# Patient Record
Sex: Male | Born: 1935 | Race: White | Hispanic: No | Marital: Married | Smoking: Former smoker
Health system: Southern US, Community
[De-identification: ages and names within clinical notes are randomized; demographics above are authoritative.]

## PROBLEM LIST (undated history)

## (undated) DIAGNOSIS — F32A Depression, unspecified: Secondary | ICD-10-CM

## (undated) DIAGNOSIS — I739 Peripheral vascular disease, unspecified: Secondary | ICD-10-CM

## (undated) DIAGNOSIS — I255 Ischemic cardiomyopathy: Secondary | ICD-10-CM

## (undated) DIAGNOSIS — I2510 Atherosclerotic heart disease of native coronary artery without angina pectoris: Secondary | ICD-10-CM

## (undated) DIAGNOSIS — F329 Major depressive disorder, single episode, unspecified: Secondary | ICD-10-CM

## (undated) DIAGNOSIS — I10 Essential (primary) hypertension: Secondary | ICD-10-CM

## (undated) DIAGNOSIS — M1990 Unspecified osteoarthritis, unspecified site: Secondary | ICD-10-CM

## (undated) DIAGNOSIS — K851 Biliary acute pancreatitis without necrosis or infection: Secondary | ICD-10-CM

## (undated) DIAGNOSIS — I4510 Unspecified right bundle-branch block: Secondary | ICD-10-CM

## (undated) DIAGNOSIS — J449 Chronic obstructive pulmonary disease, unspecified: Secondary | ICD-10-CM

## (undated) DIAGNOSIS — E785 Hyperlipidemia, unspecified: Secondary | ICD-10-CM

## (undated) DIAGNOSIS — I213 ST elevation (STEMI) myocardial infarction of unspecified site: Secondary | ICD-10-CM

## (undated) DIAGNOSIS — I350 Nonrheumatic aortic (valve) stenosis: Secondary | ICD-10-CM

## (undated) HISTORY — DX: Nonrheumatic aortic (valve) stenosis: I35.0

## (undated) HISTORY — DX: Peripheral vascular disease, unspecified: I73.9

## (undated) HISTORY — DX: Hyperlipidemia, unspecified: E78.5

## (undated) HISTORY — DX: Essential (primary) hypertension: I10

## (undated) HISTORY — DX: Major depressive disorder, single episode, unspecified: F32.9

## (undated) HISTORY — DX: Unspecified right bundle-branch block: I45.10

## (undated) HISTORY — DX: Depression, unspecified: F32.A

## (undated) HISTORY — DX: Biliary acute pancreatitis without necrosis or infection: K85.10

## (undated) HISTORY — DX: Atherosclerotic heart disease of native coronary artery without angina pectoris: I25.10

## (undated) HISTORY — DX: Unspecified osteoarthritis, unspecified site: M19.90

---

## 1969-08-09 HISTORY — PX: OTHER SURGICAL HISTORY: SHX169

## 1977-12-09 HISTORY — PX: OTHER SURGICAL HISTORY: SHX169

## 2005-10-02 ENCOUNTER — Ambulatory Visit: Payer: Self-pay | Admitting: Internal Medicine

## 2008-01-04 ENCOUNTER — Ambulatory Visit: Payer: Self-pay | Admitting: Internal Medicine

## 2008-01-04 ENCOUNTER — Telehealth (INDEPENDENT_AMBULATORY_CARE_PROVIDER_SITE_OTHER): Payer: Self-pay | Admitting: *Deleted

## 2008-01-07 ENCOUNTER — Ambulatory Visit: Payer: Self-pay | Admitting: Internal Medicine

## 2008-01-07 DIAGNOSIS — I10 Essential (primary) hypertension: Secondary | ICD-10-CM | POA: Insufficient documentation

## 2008-01-07 DIAGNOSIS — N138 Other obstructive and reflux uropathy: Secondary | ICD-10-CM

## 2008-01-07 DIAGNOSIS — N401 Enlarged prostate with lower urinary tract symptoms: Secondary | ICD-10-CM | POA: Insufficient documentation

## 2008-01-07 DIAGNOSIS — E782 Mixed hyperlipidemia: Secondary | ICD-10-CM | POA: Insufficient documentation

## 2008-01-11 ENCOUNTER — Encounter (INDEPENDENT_AMBULATORY_CARE_PROVIDER_SITE_OTHER): Payer: Self-pay | Admitting: *Deleted

## 2008-01-14 ENCOUNTER — Ambulatory Visit: Payer: Self-pay | Admitting: Cardiology

## 2008-02-29 ENCOUNTER — Ambulatory Visit: Payer: Self-pay | Admitting: Cardiology

## 2009-02-24 ENCOUNTER — Encounter: Payer: Self-pay | Admitting: Internal Medicine

## 2010-03-01 ENCOUNTER — Encounter: Admission: RE | Admit: 2010-03-01 | Discharge: 2010-03-01 | Payer: Self-pay | Admitting: Orthopedic Surgery

## 2010-03-12 ENCOUNTER — Encounter: Admission: RE | Admit: 2010-03-12 | Discharge: 2010-03-12 | Payer: Self-pay | Admitting: Orthopedic Surgery

## 2010-03-22 ENCOUNTER — Encounter: Payer: Self-pay | Admitting: Internal Medicine

## 2010-04-06 ENCOUNTER — Telehealth: Payer: Self-pay | Admitting: Cardiology

## 2010-07-30 ENCOUNTER — Encounter: Payer: Self-pay | Admitting: Internal Medicine

## 2010-07-30 ENCOUNTER — Ambulatory Visit: Payer: Self-pay | Admitting: Cardiology

## 2010-07-30 ENCOUNTER — Ambulatory Visit: Payer: Self-pay | Admitting: Internal Medicine

## 2010-07-30 DIAGNOSIS — R42 Dizziness and giddiness: Secondary | ICD-10-CM

## 2010-07-31 ENCOUNTER — Encounter: Payer: Self-pay | Admitting: Internal Medicine

## 2010-07-31 LAB — CONVERTED CEMR LAB
HCT: 46.3 % (ref 39.0–52.0)
Hemoglobin: 16.3 g/dL (ref 13.0–17.0)
RDW: 12.9 % (ref 11.5–15.5)

## 2010-08-03 ENCOUNTER — Telehealth: Payer: Self-pay | Admitting: Internal Medicine

## 2010-08-03 LAB — CONVERTED CEMR LAB
BUN: 20 mg/dL (ref 6–23)
CO2: 28 meq/L (ref 19–32)
Calcium: 9.2 mg/dL (ref 8.4–10.5)
Chloride: 103 meq/L (ref 96–112)
Creatinine, Ser: 1 mg/dL (ref 0.4–1.5)
GFR calc non Af Amer: 79.92 mL/min (ref >60)
Glucose, Bld: 76 mg/dL (ref 70–99)
Potassium: 4.1 meq/L (ref 3.5–5.1)
Sodium: 142 meq/L (ref 135–145)

## 2010-08-29 ENCOUNTER — Ambulatory Visit: Payer: Self-pay | Admitting: Internal Medicine

## 2010-08-30 ENCOUNTER — Encounter: Payer: Self-pay | Admitting: Internal Medicine

## 2010-12-24 ENCOUNTER — Other Ambulatory Visit: Payer: Self-pay | Admitting: Internal Medicine

## 2010-12-24 ENCOUNTER — Ambulatory Visit
Admission: RE | Admit: 2010-12-24 | Discharge: 2010-12-24 | Payer: Self-pay | Source: Home / Self Care | Attending: Internal Medicine | Admitting: Internal Medicine

## 2010-12-24 ENCOUNTER — Encounter: Payer: Self-pay | Admitting: Internal Medicine

## 2010-12-24 DIAGNOSIS — M1990 Unspecified osteoarthritis, unspecified site: Secondary | ICD-10-CM | POA: Insufficient documentation

## 2010-12-25 LAB — HEPATIC FUNCTION PANEL
ALT: 29 U/L (ref 0–53)
AST: 28 U/L (ref 0–37)
Albumin: 4.5 g/dL (ref 3.5–5.2)
Alkaline Phosphatase: 74 U/L (ref 39–117)
Bilirubin, Direct: 0.2 mg/dL (ref 0.0–0.3)
Total Bilirubin: 0.8 mg/dL (ref 0.3–1.2)
Total Protein: 7.2 g/dL (ref 6.0–8.3)

## 2010-12-25 LAB — CBC WITH DIFFERENTIAL/PLATELET
HCT: 47.6 % (ref 39.0–52.0)
Hemoglobin: 16.1 g/dL (ref 13.0–17.0)
MCHC: 33.9 g/dL (ref 30.0–36.0)
MCV: 96.7 fl (ref 78.0–100.0)
Platelets: 290 10*3/uL (ref 150.0–400.0)
RBC: 4.93 Mil/uL (ref 4.22–5.81)
RDW: 12.4 % (ref 11.5–14.6)
WBC: 6.3 10*3/uL (ref 4.5–10.5)

## 2010-12-25 LAB — BASIC METABOLIC PANEL
BUN: 25 mg/dL — ABNORMAL HIGH (ref 6–23)
CO2: 27 mEq/L (ref 19–32)
Calcium: 9.4 mg/dL (ref 8.4–10.5)
Chloride: 102 mEq/L (ref 96–112)
Creatinine, Ser: 1.2 mg/dL (ref 0.4–1.5)
GFR: 61.27 mL/min (ref >60.00)
Glucose, Bld: 89 mg/dL (ref 70–99)
Potassium: 4.5 mEq/L (ref 3.5–5.1)
Sodium: 138 mEq/L (ref 135–145)

## 2010-12-25 LAB — TSH: TSH: 3.36 u[IU]/mL (ref 0.35–5.50)

## 2010-12-31 ENCOUNTER — Telehealth (INDEPENDENT_AMBULATORY_CARE_PROVIDER_SITE_OTHER): Payer: Self-pay | Admitting: *Deleted

## 2011-01-01 ENCOUNTER — Telehealth (INDEPENDENT_AMBULATORY_CARE_PROVIDER_SITE_OTHER): Payer: Self-pay | Admitting: *Deleted

## 2011-01-02 ENCOUNTER — Encounter: Payer: Self-pay | Admitting: Cardiology

## 2011-01-02 ENCOUNTER — Encounter (HOSPITAL_COMMUNITY)
Admission: RE | Admit: 2011-01-02 | Discharge: 2011-01-08 | Payer: Self-pay | Source: Home / Self Care | Attending: Internal Medicine | Admitting: Internal Medicine

## 2011-01-02 ENCOUNTER — Encounter: Payer: Self-pay | Admitting: *Deleted

## 2011-01-02 ENCOUNTER — Ambulatory Visit: Admission: RE | Admit: 2011-01-02 | Discharge: 2011-01-02 | Payer: Self-pay | Source: Home / Self Care

## 2011-01-06 LAB — CONVERTED CEMR LAB
BUN: 15 mg/dL (ref 6–23)
Creatinine, Ser: 1.1 mg/dL (ref 0.4–1.5)
PSA: 2.81 ng/mL (ref 0.10–4.00)
Potassium: 4.5 meq/L (ref 3.5–5.1)

## 2011-01-09 NOTE — Assessment & Plan Note (Signed)
Summary: 1 MONTH FOLLOWUP/KN   Vital Signs:  Patient profile:   77 year old male Weight:      217.13 pounds Pulse rate:   71 / minute Pulse rhythm:   regular BP sitting:   132 / 80  (left arm) Cuff size:   large  Vitals Entered By: Army Fossa CMA (August 29, 2010 10:46 AM) CC: follow up visit, not fasting, no concerns Comments declines flu shot pharm- [facility] [address]    History of Present Illness: followup from the last office visit 2 days after the last visit, he had another episode of dizziness, he self discontinued amlodipine and no further episodes since. He restarted about within 4 days ago because he noticed his SBP was 175  ROS No headaches No chest pain or palpitations No lower extremity edema  Current Medications (verified): 1)  None 2)  Norvasc 10 Mg Tabs (Amlodipine Besylate) .[name].. 1 Tab Po Once Daily 3)  Motrin Ib 200 Mg Tabs (Ibuprofen)  Allergies (verified): No Known Drug Allergies  Past History:  Past Medical History: Reviewed history from 07/30/2010 and no changes required. Hypertension DJD, has right  knee pain and back pain PNA 2006 chest pain, 2009, saw  cardiology , decline a  stress test claudication?, 2009, saw  cardiology, declined ABIs  Past Surgical History: Reviewed history from 01/07/2008 and no changes required. 1979 myleogram   Social History: Reviewed history from 07/30/2010 and no changes required. married 2 children tobacco-- quit after 50 years aprox 1990 ETOH-- no  Physical Exam  General:  alert and well-developed.   Lungs:  normal respiratory effort, no intercostal retractions, no accessory muscle use, and normal breath sounds.   Heart:  normal rate, regular rhythm, and no murmur.   Neurologic:  alert & oriented X3, speech clear, gait normal, motor seems intact Psych:  not anxious appearing and not depressed appearing.     Impression & Recommendations:  Problem # 1:  DIZZINESS (ICD-780.4) see history of  present illness, symptoms decreased after he discontinued amlodipine (he also started to take an aspirin) Patient wonders if sx related to amlodipine  ----> while I am not sure that is the case, we can switch him from amlodipine to something else and observe. also recommend the patient to stay in aspirin if symptoms resurface, consider ENT versus neurology referral  Problem # 2:  HYPERTENSION, ESSENTIAL NOS (ICD-401.9)  see #1 Switch from amlodipine to losartan 50 See instructions EKG per wife request : sinus brady, no change from previous  His updated medication list for this problem includes:    Losartan Potassium 50 Mg Tabs (Losartan potassium) ..... One my mouth daily  Orders: EKG w/ Interpretation (93000)  Complete Medication List: 1)  Losartan Potassium 50 Mg Tabs (Losartan potassium) .... One my mouth daily 2)  Motrin Ib 200 Mg Tabs (Ibuprofen) 3)  Aspirin 81 Mg Tbec (Aspirin) .... One by mouth daily  Patient Instructions: 1)  switch to losartan one tablet daily 2)  Come back in 3 weeks for a nurse visit: 3)  BP check 4)  BMP, dx hypertension 5)  See your primary doctor in 3 months 6)  Call if the dizziness resurface Prescriptions: LOSARTAN POTASSIUM 50 MG TABS (LOSARTAN POTASSIUM) one my mouth daily  #30 x 3   Entered and Authorized by:   Elita Quick E. Paz MD   Signed by:   Nolon Rod. Paz MD on 08/31/2010   Method used:   Electronically to  [facility]       [address]       [place], [place]  [address]       Ph: [phone]       Fax: [phone]   RxID:   [id number]

## 2011-01-09 NOTE — Progress Notes (Signed)
Summary: pt needs refill  Medications Added NORVASC 10 MG TABS (AMLODIPINE BESYLATE) 1 tab po once daily       Phone Note Refill Request Message from:  Patient on [facility] on [address]  Refills Requested: Medication #1:  norvasc 5mg  qd Initial call taken by: Omer Jack,  April 06, 2010 10:28 AM    New/Updated Medications: NORVASC 10 MG TABS (AMLODIPINE BESYLATE) 1 tab po once daily Prescriptions: NORVASC 10 MG TABS (AMLODIPINE BESYLATE) 1 tab po once daily  #30 x 4   Entered by:   Kem Parkinson   Authorized by:   Ferman Hamming, MD, [facility]   Signed by:   Kem Parkinson on 04/06/2010   Method used:   Electronically to        [facility] (ret[facility])       [address]       [place], [place]  [address]       Ph: [phone]       Fax: [phone]   RxID:   [id number]

## 2011-01-09 NOTE — Assessment & Plan Note (Signed)
Summary: NAUSEA - DIZZY/CBS   Vital Signs:  Patient profile:   77 year old male Weight:      217 pounds Temp:     97.3 degrees F oral Pulse rate:   61 / minute Pulse rhythm:   regular BP sitting:   126 / 82  (left arm) Cuff size:   large  Vitals Entered By: Army Fossa CMA (July 30, 2010 1:28 PM) CC: Dizzy spells. Comments "off and on for a long time"  Nausated yesterday Unable to tell if he feels better after eating when he has a dizzy spell.    History of Present Illness: here with his wife dizzines on off x a while, yesterday he woke up very dizzy, sx worse than usual , symptoms described as his head is spinning, dizziness increased by moving his head or bending his torso. Symptoms decreased if he holds steady and close his eyes Dizziness was associated with nausea, no vomiting. He has hypertension, good medication compliance, ambulatory BPs in the 120/80 range at this point his symptoms are essentially resolved  ROS Denies chest pain, has some shortness of breath which is at baseline. No palpitations No vomiting or diarrhea No headaches, slurred speech, facial or motor deficits  Current Medications (verified): 1)  None 2)  Norvasc 10 Mg Tabs (Amlodipine Besylate) .[name].. 1 Tab Po Once Daily 3)  Motrin Ib 200 Mg Tabs (Ibuprofen)  Allergies (verified): No Known Drug Allergies  Past History:  Past Medical History: Hypertension DJD, has right  knee pain and back pain PNA 2006 chest pain, 2009, saw  cardiology , decline a  stress test claudication?, 2009, saw  cardiology, declined ABIs  Past Surgical History: Reviewed history from 01/07/2008 and no changes required. 1979 myleogram   Social History: married 2 children tobacco-- quit after 50 years aprox 1990 ETOH-- no  Physical Exam  General:  alert, well-developed, and well-nourished.   Eyes:  not pale or icteric Neck:  no masses and normal carotid upstroke.   Lungs:  normal respiratory effort, no  intercostal retractions, no accessory muscle use, and normal breath sounds.   Heart:  normal rate, regular rhythm, and no murmur.   Extremities:  no edema Neurologic:  alert & oriented X3, cranial nerves II-XII intact, strength normal in all extremities, and gait normal.   Psych:  Oriented X3, memory intact for recent and remote, normally interactive, good eye contact, not anxious appearing, and not depressed appearing.     Impression & Recommendations:  Problem # 1:  DIZZINESS (ICD-780.4) Assessment Deteriorated dizziness with peripheral  features Doubt related to a stroke or a vascular issue.  Will get a CT of the head   Orders: Radiology Referral (Radiology) Specimen Handling (32[address])  Problem # 2:  HYPERTENSION, ESSENTIAL NOS (ICD-401.9) reports good compliance with Norvasc, good ambulatory BPs. No edema. No change His updated medication list for this problem includes:    Norvasc 10 Mg Tabs (Amlodipine besylate) .[name]... 1 tab po once daily  Orders: Venipuncture (88416) TLB-BMP (Basic Metabolic Panel-BMET) (80048-METABOL) TLB-CBC Platelet - w/Differential (85025-CBCD) Specimen Handling (60630)  Complete Medication List: 1)  None  2)  Norvasc 10 Mg Tabs (Amlodipine besylate) .[name].. 1 tab po once daily 3)  Motrin Ib 200 Mg Tabs (Ibuprofen)  Patient Instructions: 1)  Please schedule a follow-up appointment in 1 month 2 see your primary care doctor Dr. Alwyn Ren 2)  Please call if the dizziness come back, you have a severe headache or other problems Prescriptions: NORVASC 10 MG  TABS (AMLODIPINE BESYLATE) 1 tab po once daily  #30 x 6   Entered and Authorized by:   Elita Quick E. Paz MD   Signed by:   Nolon Rod. Paz MD on 07/30/2010   Method used:   Electronically to        [facility]       [address]       [place], [place]  [address]       Ph: [phone]       Fax: [phone]   RxID:   [id number]

## 2011-01-09 NOTE — Letter (Signed)
Summary: CT Imaging Options Form  CT Imaging Options Form   Imported By: Lanelle Bal 08/06/2010 11:54:30  _____________________________________________________________________  External Attachment:    Type:   Image     Comment:   External Document

## 2011-01-09 NOTE — Letter (Signed)
Summary: [facility]  [facility]   Imported By: Lanelle Bal 04/10/2010 11:39:12  _____________________________________________________________________  External Attachment:    Type:   Image     Comment:   External Document

## 2011-01-09 NOTE — Progress Notes (Signed)
Summary: Still dizzy- offered Appt   Phone Note Call from Patient Call back at Home Phone [phone]   Summary of Call: Pts wife called and states that his symptoms are the same, he has a HA now (not severe). Pts wife states that she was upset because there was not an EKG done. I have offered pt an appt with Dr.Hopper his PCP because they would like to know why he is having his dizzy spells. Pts wife was going to check with the pt to see if he wanted to come in or not. Army Fossa CMA  August 03, 2010 10:21 AM   Follow-up for Phone Call        Spoke with patient's wife and offered her appts with Dr. Alwyn Ren and Dr. Drue Novel and he refused both of them. Patient's daughter called as well and wanted to let us know that her dad also wanted to switch from Dr. Alwyn Ren to Dr. Drue Novel. He was very happy with the treatment is getting from Dr. Drue Novel. Please adivse.  Follow-up by: Harold Barban,  August 03, 2010 10:26 AM  Additional Follow-up for Phone Call Additional follow up Details #1::        All above information noted. Per Dr.Paz patient instruction, patient to call if dizziness continue. Will forward to Dr.Paz for futher recommendations Additional Follow-up by: Shonna Chock CMA,  August 03, 2010 10:28 AM    Additional Follow-up for Phone Call Additional follow up Details #2::    the switch is perfectly OK with me; I haven't seen him since 12/2007 . An EKG was done then. Follow-up by: Marga Melnick MD,  August 03, 2010 12:44 PM  Additional Follow-up for Phone Call Additional follow up Details #3:: Details for Additional Follow-up Action Taken: UC or ER if symptoms severe. please arrange OV for next week Jose E. Paz MD  August 03, 2010 3:52 PM   Pts wife is aware. Army Fossa CMA  August 03, 2010 4:27 PM

## 2011-01-10 ENCOUNTER — Ambulatory Visit: Admit: 2011-01-10 | Payer: Self-pay | Admitting: Internal Medicine

## 2011-01-10 ENCOUNTER — Ambulatory Visit: Payer: Self-pay | Admitting: Internal Medicine

## 2011-01-10 NOTE — Assessment & Plan Note (Addendum)
Summary: Cardiology Nuclear Testing  Nuclear Med Background Indications for Stress Test: Evaluation for Ischemia, Surgical Clearance  Indications Comments: TKR for 02/08/11    Symptoms: Palpitations    Nuclear Pre-Procedure Cardiac Risk Factors: Family History - CAD, History of Smoking, Hypertension Caffeine/Decaff Intake: none NPO After: 7:30 PM Lungs: clear IV 0.9% NS with Angio Cath: 20g     IV Site: R Antecubital IV Started by: Stanton Kidney, EMT-P Chest Size (in) 44     Height (in): 70.5 Weight (lb): 212 BMI: 30.10 Tech Comments: This patient was here for a Lexiscan Myoview stress test for surgical clearance. Lexiscan was given without any problems.  As soon as the patient went into recovery his HR and BP bottomed. DOD Dr. Burna Forts was consulted and advised to give a fluid bolus. Bolus of 250cc given. HR and BP returned to normal. S.Williams EMTP  Nuclear Med Study 1 or 2 day study:  1 day     Stress Test Type:  Eugenie Birks Reading MD:  Cassell Clement, MD     Referring MD:  J.Paz Resting Radionuclide:  Technetium 2m Tetrofosmin     Resting Radionuclide Dose:  11 mCi  Stress Radionuclide:  Technetium 24m Tetrofosmin     Stress Radionuclide Dose:  33 mCi   Stress Protocol  Max Systolic BP: 109 mm Hg   Stress Test Technologist:  Milana Na, EMT-P     Nuclear Technologist:  Doyne Keel, CNMT  Rest Procedure  Myocardial perfusion imaging was performed at rest 45 minutes following the intravenous administration of Technetium 68m Tetrofosmin.  Stress Procedure  The patient received IV Lexiscan 0.4 mg over 15-seconds.  Technetium 74m Tetrofosmin injected at 30-seconds.  There were no significant changes and occ pvcs/rare pacwith infusion.  Quantitative spect images were obtained after a 45 minute delay.  QPS Raw Data Images:  Normal; no motion artifact; normal heart/lung ratio. Stress Images:  Normal homogeneous uptake in all areas of the myocardium. Rest Images:  Normal  homogeneous uptake in all areas of the myocardium. Subtraction (SDS):  No evidence of ischemia. Transient Ischemic Dilatation:  1.06  (Normal <1.22)  Lung/Heart Ratio:  0.27  (Normal <0.45)  Quantitative Gated Spect Images QGS EDV:  118 ml QGS ESV:  39 ml QGS EF:  67 %  Findings Normal nuclear study      Overall Impression  Exercise Capacity: Lexiscan with no exercise. BP Response: Hypotensive blood pressure response, responded to IV saline 250 cc bolus. Clinical Symptoms: Dyspnea, dizziness. ECG Impression: No significant ST segment change suggestive of ischemia. Overall Impression: Normal stress nuclear study. Overall Impression Comments: No wall motion abnormalities.  Appended Document: Cardiology Nuclear Testing stress test negative for ischemia. Patient is clear for surgery. Recommend careful monitoring of her BP during the peri-operative time , history of hypotension during the stress test. Please send my last office visit no and the stress test results to the patient's surgeon. also notify the patient  Appended Document: Cardiology Nuclear Testing   left message for pt to call back- need to know where he is having surgery so i can fax OV notes. Army Fossa CMA  January 04, 2011 9:34 AM  I spoke w/ pt he is aware. Will fax Dr.[name] results. Army Fossa CMA  January 07, 2011 10:34 AM

## 2011-01-10 NOTE — Progress Notes (Signed)
Summary: Refill Request  Phone Note Refill Request Call back at [phone] Message from:  Pharmacy on December 31, 2010 8:45 AM  Refills Requested: Medication #1:  NORVASC 5 MG TABS 1 by mouth daily.   Dosage confirmed as above?Dosage Confirmed   Supply Requested: [facility] months [facility] [address]   Next Appointment Scheduled: 2.2.12 Initial call taken by: Harold Barban,  December 31, 2010 8:45 AM    Prescriptions: NORVASC 5 MG TABS (AMLODIPINE BESYLATE) 1 by mouth daily  #30 x 0   Entered by:   Army Fossa CMA   Authorized by:   Nolon Rod. Paz MD   Signed by:   Army Fossa CMA on 12/31/2010   Method used:   Electronically to        [facility]       [address]       [place], [place]  [address]       Ph: [phone]       Fax: [phone]   RxID:   [id number]

## 2011-01-10 NOTE — Assessment & Plan Note (Signed)
Summary: FOR SURGERY CLEARANCE//PH   Vital Signs:  Patient profile:   77 year old male Height:      70.5 inches Weight:      219.50 pounds BMI:     31.16 Pulse rate:   76 / minute Pulse rhythm:   regular BP sitting:   126 / 84  (left arm) Cuff size:   large  Vitals Entered By: Army Fossa CMA (December 24, 2010 1:55 PM) CC: Surgical clearnace- not fasting  Comments knee replacement on March 2 [facility] [address]    History of Present Illness: needs a right total knee replacement Needs clearance Feeling well, he is retired but remains active, doing yard work, and minor home repairs.  As far as his hypertension, mild intention was to change from amlodipine to valsartan due to dizziness. Patient reports that his pharmacy  "never got a prescription" for the new medicine and they got him amlodipine so he has been taking that every day. The dizziness seems to be better   ROS No chest pain or shortness or breath No nausea vomiting or diarrhea No edema.   Current Medications (verified): 1)  Norvasc 5 Mg Tabs (Amlodipine Besylate) .[name].. 1 By Mouth Daily  Allergies (verified): No Known Drug Allergies  Past History:  Past Medical History: Reviewed history from 07/30/2010 and no changes required. Hypertension DJD, has right  knee pain and back pain PNA 2006 chest pain, 2009, saw  cardiology , decline a  stress test claudication?, 2009, saw  cardiology, declined ABIs  Past Surgical History: Reviewed history from 01/07/2008 and no changes required. 1979 myleogram   Family History: DM--no CAD--  M , F (several MIs) pancreatic Ca-- M colon ca--no prostate ca--no  Social History: married 2 children tobacco-- quit after 50 years aprox 1990 ETOH-- no retired   Physical Exam  General:  alert, well-developed, and moderately overweight-appearing.   Neck:  no masses and normal carotid upstroke.   Lungs:  normal respiratory effort, no intercostal retractions, no  accessory muscle use, and normal breath sounds.   Heart:  normal rate, regular rhythm, and no murmur.   Abdomen:  soft, non-tender, no distention, no masses, no guarding, and no rigidity.  no bruit    Impression & Recommendations:  Problem # 1:  DEGENERATIVE JOINT DISEASE (ICD-715.90) needs a right total knee replacement. he is 77 year old, has a history of hypertension and a family history heart disease. Also he is a former smoker, currently asymptomatic from the respiratory standpoint. He never had any objective evaluation of his heart  It is important to find out whether or not he has occult  heart disease before surgery. EKG, normal sinus rhythm, RBBB (not new ) Recommend a stress test.  The following medications were removed from the medication list:    Motrin Ib 200 Mg Tabs (Ibuprofen)    Aspirin 81 Mg Tbec (Aspirin) ..... One by mouth daily  Problem # 2:  HYPERTENSION (ICD-401.9)  see history of present illness, he never started losartan, currently seem to be doing well with amlodipine  The following medications were removed from the medication list:    Losartan Potassium 50 Mg Tabs (Losartan potassium) ..... One my mouth daily His updated medication list for this problem includes:    Norvasc 5 Mg Tabs (Amlodipine besylate) .[name]... 1 by mouth daily  Orders: Venipuncture (11914) TLB-BMP (Basic Metabolic Panel-BMET) (80048-METABOL) TLB-CBC Platelet - w/Differential (85025-CBCD) TLB-Hepatic/Liver Function Pnl (80076-HEPATIC) TLB-TSH (Thyroid Stimulating Hormone) (84443-TSH) EKG w/ Interpretation (93000)  Problem #  3:  DIZZINESS (ICD-780.4) Essentially resolved   Problem # 4:  PREOPERATIVE EXAMINATION (ICD-V72.84) Assessment: New see #1 Orders: Cardiology Referral (Cardiology)  Complete Medication List: 1)  Norvasc 5 Mg Tabs (Amlodipine besylate) .[name].. 1 by mouth daily  Other Orders: Specimen Handling ([address])   Orders Added: 1)  Venipuncture [36415] 2)  TLB-BMP  (Basic Metabolic Panel-BMET) [80048-METABOL] 3)  TLB-CBC Platelet - w/Differential [85025-CBCD] 4)  TLB-Hepatic/Liver Function Pnl [80076-HEPATIC] 5)  TLB-TSH (Thyroid Stimulating Hormone) [84443-TSH] 6)  Specimen Handling [99000] 7)  EKG w/ Interpretation [93000] 8)  Cardiology Referral [Cardiology] 9)  Est. Patient Level IV [60454]

## 2011-01-10 NOTE — Progress Notes (Signed)
Summary: nuc pre procedure  Phone Note Outgoing Call Call back at Home Phone [phone]   Call placed by: Cathlyn Parsons RN,  January 01, 2011 3:35 PM Call placed to: Patient Reason for Call: Confirm/change Appt Summary of Call: Reviewed information on Myoview Information Sheet (see scanned document for further details).  Spoke with patients wife.     Nuclear Med Background Indications for Stress Test: Evaluation for Ischemia, Surgical Clearance  Indications Comments: TKR for 02/08/11       Nuclear Pre-Procedure Cardiac Risk Factors: Family History - CAD, History of Smoking, Hypertension Height (in): 70.5

## 2011-01-30 ENCOUNTER — Other Ambulatory Visit: Payer: Self-pay | Admitting: Orthopedic Surgery

## 2011-01-30 ENCOUNTER — Other Ambulatory Visit (HOSPITAL_COMMUNITY): Payer: Self-pay | Admitting: Orthopedic Surgery

## 2011-01-30 ENCOUNTER — Encounter (HOSPITAL_COMMUNITY): Payer: Medicare Other

## 2011-01-30 ENCOUNTER — Ambulatory Visit (HOSPITAL_COMMUNITY)
Admission: RE | Admit: 2011-01-30 | Discharge: 2011-01-30 | Disposition: A | Discharge status: Home / Self Care | Payer: Medicare Other | Source: Ambulatory Visit | Attending: Orthopedic Surgery | Admitting: Orthopedic Surgery

## 2011-01-30 DIAGNOSIS — Z01818 Encounter for other preprocedural examination: Secondary | ICD-10-CM | POA: Insufficient documentation

## 2011-01-30 DIAGNOSIS — Z01812 Encounter for preprocedural laboratory examination: Secondary | ICD-10-CM | POA: Insufficient documentation

## 2011-01-30 DIAGNOSIS — Z01811 Encounter for preprocedural respiratory examination: Secondary | ICD-10-CM

## 2011-01-30 LAB — COMPREHENSIVE METABOLIC PANEL
AST: 25 U/L (ref 0–37)
Albumin: 3.9 g/dL (ref 3.5–5.2)
Alkaline Phosphatase: 79 U/L (ref 39–117)
CO2: 28 mEq/L (ref 19–32)
Calcium: 9.1 mg/dL (ref 8.4–10.5)
Creatinine, Ser: 1.15 mg/dL (ref 0.4–1.5)
Glucose, Bld: 88 mg/dL (ref 70–99)
Total Bilirubin: 0.7 mg/dL (ref 0.3–1.2)
Total Protein: 7.1 g/dL (ref 6.0–8.3)

## 2011-01-30 LAB — CBC
HCT: 44.3 % (ref 39.0–52.0)
Hemoglobin: 15 g/dL (ref 13.0–17.0)
MCH: 31.3 pg (ref 26.0–34.0)
MCHC: 33.9 g/dL (ref 30.0–36.0)

## 2011-01-30 LAB — URINALYSIS, ROUTINE W REFLEX MICROSCOPIC
Urine Glucose, Fasting: NEGATIVE mg/dL
pH: 6.5 (ref 5.0–8.0)

## 2011-01-30 LAB — SURGICAL PCR SCREEN
MRSA, PCR: NEGATIVE
Staphylococcus aureus: NEGATIVE

## 2011-01-30 LAB — PROTIME-INR
INR: 1.14 (ref 0.00–1.49)
Prothrombin Time: 14.8 seconds (ref 11.6–15.2)

## 2011-02-07 HISTORY — PX: TOTAL KNEE ARTHROPLASTY: SHX125

## 2011-02-08 ENCOUNTER — Inpatient Hospital Stay (HOSPITAL_COMMUNITY)
Admission: RE | Admit: 2011-02-08 | Discharge: 2011-02-12 | DRG: 470 | Disposition: A | Discharge status: Home Health | Payer: Medicare Other | Source: Ambulatory Visit | Attending: Orthopedic Surgery | Admitting: Orthopedic Surgery

## 2011-02-08 DIAGNOSIS — J984 Other disorders of lung: Secondary | ICD-10-CM | POA: Diagnosis present

## 2011-02-08 DIAGNOSIS — I10 Essential (primary) hypertension: Secondary | ICD-10-CM | POA: Diagnosis present

## 2011-02-08 DIAGNOSIS — M1710 Unilateral primary osteoarthritis, unspecified knee: Principal | ICD-10-CM | POA: Diagnosis present

## 2011-02-08 LAB — TYPE AND SCREEN

## 2011-02-09 LAB — CBC
HCT: 36.9 % — ABNORMAL LOW (ref 39.0–52.0)
Hemoglobin: 12.7 g/dL — ABNORMAL LOW (ref 13.0–17.0)
MCH: 32 pg (ref 26.0–34.0)
MCHC: 34.4 g/dL (ref 30.0–36.0)

## 2011-02-09 LAB — BASIC METABOLIC PANEL
BUN: 14 mg/dL (ref 6–23)
CO2: 26 mEq/L (ref 19–32)
Calcium: 8.3 mg/dL — ABNORMAL LOW (ref 8.4–10.5)
Creatinine, Ser: 0.93 mg/dL (ref 0.4–1.5)
Glucose, Bld: 152 mg/dL — ABNORMAL HIGH (ref 70–99)

## 2011-02-10 LAB — CBC
HCT: 35.4 % — ABNORMAL LOW (ref 39.0–52.0)
MCH: 31 pg (ref 26.0–34.0)
MCHC: 33.1 g/dL (ref 30.0–36.0)
MCV: 93.9 fL (ref 78.0–100.0)
Platelets: 255 10*3/uL (ref 150–400)
RDW: 12.4 % (ref 11.5–15.5)

## 2011-02-10 LAB — BASIC METABOLIC PANEL
BUN: 14 mg/dL (ref 6–23)
Creatinine, Ser: 0.79 mg/dL (ref 0.4–1.5)
GFR calc non Af Amer: >60 mL/min (ref >60)
Glucose, Bld: 122 mg/dL — ABNORMAL HIGH (ref 70–99)

## 2011-02-11 ENCOUNTER — Inpatient Hospital Stay (HOSPITAL_COMMUNITY): Payer: Medicare Other

## 2011-02-11 LAB — CBC
HCT: 33.6 % — ABNORMAL LOW (ref 39.0–52.0)
MCHC: 33.9 g/dL (ref 30.0–36.0)
Platelets: 240 10*3/uL (ref 150–400)
RDW: 12.4 % (ref 11.5–15.5)
WBC: 13.7 10*3/uL — ABNORMAL HIGH (ref 4.0–10.5)

## 2011-02-11 NOTE — H&P (Addendum)
Jon Baker, Jon Baker NO.:  [id number]  MEDICAL RECORD NO.:  [id number]         PATIENT TYPE:  LINP  LOCATION:  1619                         FACILITY:  [facility]  PHYSICIAN:  Ollen Gross, M.D.    DATE OF BIRTH:  06/05/1932  DATE OF ADMISSION:  02/08/2011 DATE OF DISCHARGE:                             HISTORY & PHYSICAL   CHIEF COMPLAINT:  Right knee pain.  BRIEF HISTORY:  Jon Baker has been followed by Dr. Lequita Halt for worsening pain in the right knee.  He has undergone cortisone injection as well as viscosupplementation, both of these seem to help initially and then over time seem to not be providing much relief.  At this point, Jon Baker is having increased pain and dysfunction to the point that it is limiting what he is able to do.  He now presents for right total knee arthroplasty.  His primary care physician is Dr. Drue Novel and he has been cleared for surgery by Dr. Drue Novel with recommendation of careful monitoring of the patient's blood pressure during the perioperative period as he did exhibit hypotension during his stress testing.  MEDICATION ALLERGIES:  No known drug allergies.  CURRENT MEDICATIONS:  Norvasc 5 mg 1 tablet p.o. daily.  PAST MEDICAL HISTORY: 1. Impaired vision, the patient wears glasses. 2. Dentures, full on top and partial on bottom. 3. Hypertension. 4. Arthritis.  PAST SURGICAL HISTORY:  None.  FAMILY HISTORY:  Father passed at the age of 64, he had heart disease. Mother passed at age of 74 of pancreatic cancer.  SOCIAL HISTORY:  The patient is married.  He admits past use of tobacco products.  He has not smoked since 1990.  He denies use of alcohol products or street drugs.  He has 2 daughters.  He does plan to go home following his hospital stay.  He lives in a 2-level home.  He has 10 steps to enter the home.  Once in the house, his bedroom is on the level.  REVIEW OF SYSTEMS:  GENERAL:  Negative for fevers, chills or  weight change.  HEENT/NEURO:  Negative for headache, blurred vision. DERMATOLOGIC:  Negative for rash or lesion.  RESPIRATORY:  Positive for shortness of breath on exertion.  CARDIOVASCULAR:  Negative for chest pain or palpitations.  GI:  Negative for nausea, vomiting or diarrhea. GU:  Positive for weak stream and urinating at nighttime. MUSCULOSKELETAL:  Positive for joint pain and back pain.  PHYSICAL EXAMINATION:  VITAL SIGNS:  Pulse 72, respirations 18, blood pressure 110/62 in the left arm. GENERAL:  Jon Baker is alert and oriented x3; well-developed, well- nourished, in no apparent distress.  He is a pleasant 77 year old male. He is accompanied by his wife. HEENT:  Normocephalic, atraumatic.  Extraocular movements intact.  The patient wears glasses and dentures. NECK:  Supple with full range of motion without lymphadenopathy. CHEST:  Lungs are clear to auscultation bilaterally without wheezes, rhonchi or rales. HEART:  Regular rate and rhythm without murmur. ABDOMEN:  Bowel sounds present in all 4 quadrants.  Abdomen is soft, nontender to palpation. EXTREMITIES:  Right knee tender with palpation over the medial joint line.  He has marked crepitus throughout the range of motion.  He has decreased range of motion with discomfort.  No instability is noted. Range of motion is 5 to 125 degrees. SKIN:  Unremarkable. NEUROLOGIC:  Sensation is intact.  Peripheral vascular, carotid pulses 2+ bilaterally without bruit.  RADIOGRAPHS:  AP and lateral views of the patient's right knee reveal bone-on-bone arthritic changes in both the medial and patellofemoral compartments.  IMPRESSION:  End-stage arthritis of the right knee.  PLAN:  Right total knee arthroplasty to be performed by Dr. Lequita Halt.     Rozell Searing, PAC   ______________________________ Ollen Gross, M.D.    LD/MEDQ  D:  02/07/2011  T:  02/07/2011  Job:  [id number]  cc:   Willow Ora, MD [phone]  [place], [place] [address]  Electronically Signed by Rozell Searing  on 02/11/2011 01:24:08 PM Electronically Signed by Ollen Gross M.D. on 02/13/2011 03:47:48 PM

## 2011-02-12 LAB — CBC
Hemoglobin: 11.8 g/dL — ABNORMAL LOW (ref 13.0–17.0)
MCH: 31.1 pg (ref 26.0–34.0)
MCHC: 33.4 g/dL (ref 30.0–36.0)
Platelets: 260 10*3/uL (ref 150–400)
RBC: 3.79 MIL/uL — ABNORMAL LOW (ref 4.22–5.81)

## 2011-02-14 NOTE — Op Note (Signed)
Jon Baker, [name]:  [id number]  MEDICAL RECORD NO.:  [id number]           PATIENT TYPE:  I  LOCATION:  0006                         FACILITY:  [facility]  PHYSICIAN:  Ollen Gross, M.D.    DATE OF BIRTH:  March 02, 1932  DATE OF PROCEDURE:  02/08/2011 DATE OF DISCHARGE:                              OPERATIVE REPORT   PREOPERATIVE DIAGNOSIS:  Osteoarthritis right knee.  POSTOPERATIVE DIAGNOSIS:  Osteoarthritis right knee.  PROCEDURE:  Right total-knee arthroplasty.  SURGEON:  Ollen Gross, M.D.  ASSISTANT:  Alexzandrew L. Perkins, P.A.C.  ANESTHESIA:  General.  ESTIMATED BLOOD LOSS:  Minimal.  DRAINS:  Hemovac x1.  TOURNIQUET TIME:  38 minutes at 300 mmHg.  COMPLICATIONS:  None.  CONDITION:  Stable to recovery.  BRIEF CLINICAL NOTE:  Jon Baker is a 77 year old male with severe end-stage arthritis of the right knee with progressively worsening pain and dysfunction.  He has failed nonoperative management and presents for a total-knee arthroplasty.  PROCEDURE IN DETAIL:  After the successful administration of general anesthetic, a tourniquet was placed high on the right thigh and the right lower extremity was prepped and draped in the usual sterile fashion.  Extremity was wrapped in Esmarch, knee flexed, tourniquet inflated to 300 mmHg.  Midline incision was made with a 10 blade through the subcutaneous tissue to the level of the extensor mechanism.  A fresh blade was used to make a medial parapatellar arthrotomy.  Soft tissue on the proximal medial tibia was subperiosteally elevated to the joint line with the knife and into the semimembranosus bursa with a Cobb elevator. Soft tissue laterally was elevated with attention being paid to avoiding the patellar tendon on the tibial tubercle.  The patella was everted, knee flexed 90 degrees and ACL and PCL were removed.  Drill was used to create a starting hole in the distal femur and the canal was  thoroughly irrigated.  A 5-degrees right valgus alignment guide was placed.  The distal femoral cutting block was then pinned to remove 11 mm off the distal femur.  Distal femoral resection was made with an oscillating saw.  Tibia subluxed forward and menisci removed.  Extramedullary tibial alignment guide was placed referencing proximally at the medial aspect of the tibial tubercle and distally along the second metatarsal axis and tibial crest.  The block was pinned to remove 2 mm off the more deficient medial side.  The size 4 was the most appropriate tibial component and the proximal tibia was prepared with the modular drill and keel punch for the size 4.  Femoral sizing guide was placed.  Size 4 was the most appropriate size femur.  Rotation was marked at the epicondylar axis and confirmed by creating rectangular flexion gap at 90 degrees.  The block was pinned in this rotation and the anterior, posterior and chamfer cuts were made. Intercondylar block was placed and that cut was made.  Trial size 4 posterior stabilized femur was placed.  A 10-mm posterior stabilized rotating platform insert trial was placed.  There was a tiny bit of play with the 10 so I went to 12.5, which allowed for  full extension with excellent varus-valgus and anterior-posterior balance throughout full range of motion.  The patella was then everted and thickness measured to be 27 mm.  Freehand resection was taken to 15 mm, 41 template was placed, lug holes were drilled, trial patella was placed and it tracks normally.  Osteophytes were removed off the posterior femur with the trial in place.  All trials were removed and the cut bone surfaces were prepared with pulsatile lavage.  The cement was mixed and once ready for implantation, the size 4 mobile bearing tibial tray, size 4 posterior stabilized femur and 41 patella were cemented into place and the patella was held with a clamp.  The trial 12.5-mm insert  was placed, knee held in full extension and all extruded cement was removed.  When the cement was fully hardened, then the permanent 12.5-mm posterior stabilized rotating platform insert was placed.  The wound was copiously irrigated with saline solution and the arthrotomy closed over a Hemovac drain with interrupted #1 PDS.  Flexion against gravity was about 135 degrees.  The patella tracks normally.  Subcutaneous was closed with interrupted 2-0 Vicryl and subcuticular running 4-0 Monocryl.  The incision was then cleaned and dried and the catheter for the Marcaine pain pump was placed and the pump was initiated.  Steri-Strips and a bulky sterile dressing were applied and he was placed into a knee immobilizer, awakened and transferred to recovery in stable condition.     Ollen Gross, M.D.     FA/MEDQ  D:  02/08/2011  T:  02/08/2011  Job:  [id number]  Electronically Signed by Ollen Gross M.D. on 02/13/2011 03:47:46 PM

## 2011-02-25 ENCOUNTER — Ambulatory Visit: Payer: Medicare Other | Attending: Orthopedic Surgery | Admitting: Rehabilitation

## 2011-02-25 DIAGNOSIS — Z96659 Presence of unspecified artificial knee joint: Secondary | ICD-10-CM | POA: Insufficient documentation

## 2011-02-25 DIAGNOSIS — M25569 Pain in unspecified knee: Secondary | ICD-10-CM | POA: Insufficient documentation

## 2011-02-25 DIAGNOSIS — IMO0001 Reserved for inherently not codable concepts without codable children: Secondary | ICD-10-CM | POA: Insufficient documentation

## 2011-02-25 DIAGNOSIS — M25669 Stiffness of unspecified knee, not elsewhere classified: Secondary | ICD-10-CM | POA: Insufficient documentation

## 2011-02-27 ENCOUNTER — Ambulatory Visit: Payer: Medicare Other | Admitting: Physical Therapy

## 2011-03-01 ENCOUNTER — Ambulatory Visit: Payer: Medicare Other | Admitting: Physical Therapy

## 2011-03-01 ENCOUNTER — Ambulatory Visit: Payer: Medicare Other | Attending: Internal Medicine | Admitting: Physical Therapy

## 2011-03-01 DIAGNOSIS — M25569 Pain in unspecified knee: Secondary | ICD-10-CM | POA: Insufficient documentation

## 2011-03-01 DIAGNOSIS — Z96659 Presence of unspecified artificial knee joint: Secondary | ICD-10-CM | POA: Insufficient documentation

## 2011-03-01 DIAGNOSIS — M25669 Stiffness of unspecified knee, not elsewhere classified: Secondary | ICD-10-CM | POA: Insufficient documentation

## 2011-03-01 DIAGNOSIS — IMO0001 Reserved for inherently not codable concepts without codable children: Secondary | ICD-10-CM | POA: Insufficient documentation

## 2011-03-04 ENCOUNTER — Ambulatory Visit: Payer: Medicare Other | Attending: Orthopedic Surgery | Admitting: Physical Therapy

## 2011-03-04 DIAGNOSIS — IMO0001 Reserved for inherently not codable concepts without codable children: Secondary | ICD-10-CM | POA: Insufficient documentation

## 2011-03-04 DIAGNOSIS — M25669 Stiffness of unspecified knee, not elsewhere classified: Secondary | ICD-10-CM | POA: Insufficient documentation

## 2011-03-04 DIAGNOSIS — Z96659 Presence of unspecified artificial knee joint: Secondary | ICD-10-CM | POA: Insufficient documentation

## 2011-03-04 DIAGNOSIS — M25569 Pain in unspecified knee: Secondary | ICD-10-CM | POA: Insufficient documentation

## 2011-03-06 ENCOUNTER — Ambulatory Visit: Payer: Medicare Other | Admitting: Physical Therapy

## 2011-03-08 ENCOUNTER — Ambulatory Visit: Payer: Medicare Other | Admitting: Physical Therapy

## 2011-03-11 ENCOUNTER — Ambulatory Visit: Payer: Medicare Other | Attending: Orthopedic Surgery | Admitting: Physical Therapy

## 2011-03-11 DIAGNOSIS — M25569 Pain in unspecified knee: Secondary | ICD-10-CM | POA: Insufficient documentation

## 2011-03-11 DIAGNOSIS — IMO0001 Reserved for inherently not codable concepts without codable children: Secondary | ICD-10-CM | POA: Insufficient documentation

## 2011-03-11 DIAGNOSIS — M25669 Stiffness of unspecified knee, not elsewhere classified: Secondary | ICD-10-CM | POA: Insufficient documentation

## 2011-03-11 DIAGNOSIS — Z96659 Presence of unspecified artificial knee joint: Secondary | ICD-10-CM | POA: Insufficient documentation

## 2011-03-13 ENCOUNTER — Ambulatory Visit: Payer: Medicare Other | Admitting: Physical Therapy

## 2011-03-13 DIAGNOSIS — M25669 Stiffness of unspecified knee, not elsewhere classified: Secondary | ICD-10-CM | POA: Insufficient documentation

## 2011-03-13 DIAGNOSIS — M25569 Pain in unspecified knee: Secondary | ICD-10-CM | POA: Insufficient documentation

## 2011-03-13 DIAGNOSIS — Z96659 Presence of unspecified artificial knee joint: Secondary | ICD-10-CM | POA: Insufficient documentation

## 2011-03-13 DIAGNOSIS — IMO0001 Reserved for inherently not codable concepts without codable children: Secondary | ICD-10-CM | POA: Insufficient documentation

## 2011-03-22 ENCOUNTER — Ambulatory Visit: Payer: Medicare Other | Admitting: Physical Therapy

## 2011-03-22 DIAGNOSIS — Z96659 Presence of unspecified artificial knee joint: Secondary | ICD-10-CM | POA: Insufficient documentation

## 2011-03-22 DIAGNOSIS — IMO0001 Reserved for inherently not codable concepts without codable children: Secondary | ICD-10-CM | POA: Insufficient documentation

## 2011-03-22 DIAGNOSIS — M25669 Stiffness of unspecified knee, not elsewhere classified: Secondary | ICD-10-CM | POA: Insufficient documentation

## 2011-03-22 DIAGNOSIS — M25569 Pain in unspecified knee: Secondary | ICD-10-CM | POA: Insufficient documentation

## 2011-03-22 NOTE — Discharge Summary (Signed)
Jon Baker, Jon Baker             ACCOUNT NO.:  [id number]  MEDICAL RECORD NO.:  [id number]           PATIENT TYPE:  I  LOCATION:  1619                         FACILITY:  [facility]  PHYSICIAN:  Ollen Gross, M.D.    DATE OF BIRTH:  [birth year]  DATE OF ADMISSION:  02/08/2011 DATE OF DISCHARGE:  02/12/2011                              DISCHARGE SUMMARY   ADMITTING DIAGNOSES: 1. Osteoarthritis, right knee. 2. Impaired vision. 3. Hypertension. 4. Osteoarthritis.  DISCHARGE DIAGNOSES: 1. Osteoarthritis, right knee, status post right total knee     replacement arthroplasty. 2. Impaired vision. 3. Hypertension. 4. Osteoarthritis. 5. Postop bibasilar atelectasis.  PROCEDURES:  February 08, 2011, right total knee.  SURGEON:  Ollen Gross, MD  ASSISTANT:  Alexzandrew L. Julien Girt, [facility]  ANESTHESIA:  General.  CONSULTS:  None.  BRIEF HISTORY:  Jon Baker is a 78-year male with severe end-stage arthritis of the right knee, progressive worsening pain and dysfunction, failed nonoperative management options, and now presents for total knee arthroplasty.  LABORATORY DATA:  Do not have the admission CBC, preoperative labs, but a followup CBC after surgery showed hemoglobin 12.7, drifted down to 11.4, but last H and H is back up to 11.8 with hematocrit of 35.3.  I do not have the admission Chem panel, but serial BMETs were followed for 48 hours.  Electrolytes remained within normal limits.  Blood group type O negative.  X-rays, two-view chest, February 11, 2011, small amount of basilar atelectasis.  No focal air space disease.  HOSPITAL COURSE:  The patient was admitted to [facility], taken to OR, underwent above-stated procedure without complication.  The patient tolerated procedure well, later transferred to the orthopedic floor, started on p.o. and IV analgesic pain control following surgery and fair amount of pain on day #1 throughout.  Hemovac was pulled. Encouraged p.m.  meds and started ambulation.  By day #2, the patient was doing a little bit better.  Pain was under better control.  Hemoglobin was stable at 11.7.  Dressing changed, incision looked good.  Continued to progress well with physical therapy, although did have a little bit delay of breathing later on that day and O2 sats were down a little bit, so we got a chest x-ray which did show some atelectasis.  Encouraged incentive spirometer.  Continued monitoring the sats.  By postop day #3, he was still having some drops in the sats.  Dressing was changed, incision looked good.  He had some positive volume, so we are diuresing him and he was not ready to go at that point.  We kept another day after the diuresis and by the following day on postop day #4, felt the low O2 sats were high 80s, low 90s which was more of a chronic issue, he had diuresed well.  There was nothing acute on the chest x-ray, did show the atelectasis.  Incision was healing well, was discharged home at that time.  DISCHARGE PLAN: 1. The patient was discharged home on February 12, 2011. 2. Discharge diagnoses, please see above. 3. Discharge medications, Percocet, Robaxin, Xarelto, and continued     him on amlodipine.  DIET:  Heart-healthy diet.  ACTIVITY:  Weightbearing as tolerated, total knee protocol.  Home health PT and follow up in 2 weeks.  DISPOSITION:  Home.  CONDITION ON DISCHARGE:  Improved.     Alexzandrew L. Julien Girt, P.A.C.   ______________________________ Ollen Gross, M.D.    ALP/MEDQ  D:  03/14/2011  T:  03/14/2011  Job:  [id number]  Electronically Signed by Patrica Duel P.A.C. on 03/18/2011 07:49:07 AM Electronically Signed by Ollen Gross M.D. on 03/22/2011 09:00:56 AM

## 2011-03-25 ENCOUNTER — Ambulatory Visit: Payer: Medicare Other | Admitting: Physical Therapy

## 2011-03-25 DIAGNOSIS — Z96659 Presence of unspecified artificial knee joint: Secondary | ICD-10-CM | POA: Insufficient documentation

## 2011-03-25 DIAGNOSIS — M25569 Pain in unspecified knee: Secondary | ICD-10-CM | POA: Insufficient documentation

## 2011-03-25 DIAGNOSIS — IMO0001 Reserved for inherently not codable concepts without codable children: Secondary | ICD-10-CM | POA: Insufficient documentation

## 2011-03-25 DIAGNOSIS — M25669 Stiffness of unspecified knee, not elsewhere classified: Secondary | ICD-10-CM | POA: Insufficient documentation

## 2011-03-27 ENCOUNTER — Ambulatory Visit: Payer: Medicare Other | Admitting: Physical Therapy

## 2011-03-27 DIAGNOSIS — M25669 Stiffness of unspecified knee, not elsewhere classified: Secondary | ICD-10-CM | POA: Insufficient documentation

## 2011-03-27 DIAGNOSIS — M25569 Pain in unspecified knee: Secondary | ICD-10-CM | POA: Insufficient documentation

## 2011-03-27 DIAGNOSIS — IMO0001 Reserved for inherently not codable concepts without codable children: Secondary | ICD-10-CM | POA: Insufficient documentation

## 2011-03-27 DIAGNOSIS — Z96659 Presence of unspecified artificial knee joint: Secondary | ICD-10-CM | POA: Insufficient documentation

## 2011-03-29 ENCOUNTER — Ambulatory Visit: Payer: Medicare Other | Admitting: Physical Therapy

## 2011-04-01 ENCOUNTER — Ambulatory Visit: Payer: Medicare Other | Admitting: Physical Therapy

## 2011-04-01 DIAGNOSIS — IMO0001 Reserved for inherently not codable concepts without codable children: Secondary | ICD-10-CM | POA: Insufficient documentation

## 2011-04-01 DIAGNOSIS — Z96659 Presence of unspecified artificial knee joint: Secondary | ICD-10-CM | POA: Insufficient documentation

## 2011-04-01 DIAGNOSIS — M25569 Pain in unspecified knee: Secondary | ICD-10-CM | POA: Insufficient documentation

## 2011-04-01 DIAGNOSIS — M25669 Stiffness of unspecified knee, not elsewhere classified: Secondary | ICD-10-CM | POA: Insufficient documentation

## 2011-04-03 ENCOUNTER — Ambulatory Visit: Payer: Medicare Other | Admitting: Physical Therapy

## 2011-04-03 DIAGNOSIS — M25669 Stiffness of unspecified knee, not elsewhere classified: Secondary | ICD-10-CM | POA: Insufficient documentation

## 2011-04-03 DIAGNOSIS — IMO0001 Reserved for inherently not codable concepts without codable children: Secondary | ICD-10-CM | POA: Insufficient documentation

## 2011-04-03 DIAGNOSIS — Z96659 Presence of unspecified artificial knee joint: Secondary | ICD-10-CM | POA: Insufficient documentation

## 2011-04-03 DIAGNOSIS — M25569 Pain in unspecified knee: Secondary | ICD-10-CM | POA: Insufficient documentation

## 2011-04-05 ENCOUNTER — Ambulatory Visit: Payer: Medicare Other | Admitting: Physical Therapy

## 2011-04-05 DIAGNOSIS — M25669 Stiffness of unspecified knee, not elsewhere classified: Secondary | ICD-10-CM | POA: Insufficient documentation

## 2011-04-05 DIAGNOSIS — Z96659 Presence of unspecified artificial knee joint: Secondary | ICD-10-CM | POA: Insufficient documentation

## 2011-04-05 DIAGNOSIS — IMO0001 Reserved for inherently not codable concepts without codable children: Secondary | ICD-10-CM | POA: Insufficient documentation

## 2011-04-05 DIAGNOSIS — M25569 Pain in unspecified knee: Secondary | ICD-10-CM | POA: Insufficient documentation

## 2011-04-08 ENCOUNTER — Ambulatory Visit: Payer: Medicare Other | Admitting: Physical Therapy

## 2011-04-08 DIAGNOSIS — M25669 Stiffness of unspecified knee, not elsewhere classified: Secondary | ICD-10-CM | POA: Insufficient documentation

## 2011-04-08 DIAGNOSIS — Z96659 Presence of unspecified artificial knee joint: Secondary | ICD-10-CM | POA: Insufficient documentation

## 2011-04-08 DIAGNOSIS — M25569 Pain in unspecified knee: Secondary | ICD-10-CM | POA: Insufficient documentation

## 2011-04-08 DIAGNOSIS — IMO0001 Reserved for inherently not codable concepts without codable children: Secondary | ICD-10-CM | POA: Insufficient documentation

## 2011-04-10 ENCOUNTER — Ambulatory Visit: Payer: Medicare Other | Attending: Orthopedic Surgery | Admitting: Physical Therapy

## 2011-04-10 DIAGNOSIS — IMO0001 Reserved for inherently not codable concepts without codable children: Secondary | ICD-10-CM | POA: Insufficient documentation

## 2011-04-10 DIAGNOSIS — M25669 Stiffness of unspecified knee, not elsewhere classified: Secondary | ICD-10-CM | POA: Insufficient documentation

## 2011-04-10 DIAGNOSIS — Z96659 Presence of unspecified artificial knee joint: Secondary | ICD-10-CM | POA: Insufficient documentation

## 2011-04-10 DIAGNOSIS — M25569 Pain in unspecified knee: Secondary | ICD-10-CM | POA: Insufficient documentation

## 2011-04-23 NOTE — Assessment & Plan Note (Signed)
[facility]                            CARDIOLOGY OFFICE NOTE   Jon Baker, Jon Baker                      MRN:          [id number]  DATE:02/29/2008                            DOB:          04-17-1932    Jon Baker is a 77 -year-old gentleman who I recently was asked to  evaluate for hypertension, chest pain and possible claudication.  I saw  him initially on January 14, 2008.  At that time, we felt that his chest  pain was somewhat atypical, but we did offer stress Myoview.  However,  he declined this.  We also offered ABIs.  He again declined this as  well.  His blood pressure is elevated and we placed him on Norvasc 5 mg  daily.  However, he was only taking it intermittently at home.  Since  then, he has not had chest pain.  He has dyspnea with more extreme  activities, but not with routine activities.  He continues to have lower  extremity pain.   MEDICATIONS:  Include amlodipine 5 mg p.o. daily.   PHYSICAL EXAM:  Today, shows a blood pressure of 146/95.  His pulse is  75.  Weighs 224 pounds.  HEENT:  Normal.  NECK:  Supple.  CHEST:  Clear.  CARDIOVASCULAR:  Regular rate and rhythm.  ABDOMEN:  Shows no tenderness.  EXTREMITIES: Show no edema.   Electrocardiogram shows sinus rhythm at a rate of 66.  There is a right  bundle branch block.   DIAGNOSIS:  1. Hypertension - Jon Baker blood pressure is mildly elevated      today.  However, he did bring records from home.  When he was      taking his amlodipine it appears that his blood pressure was      reasonably well controlled.  When he was not taking it appears that      his systolic runs in the 145 range.  His diastolic 95 range.  I      have explained that he should continue to take the Norvasc 5 mg      p.o. daily. I am not convinced that he will do this at present.  We      will have his labs that Dr. Alwyn Ren drew recently forwarded to [place].  2. History of chest pain- I again  offered a stress test today, but he      again declined.  He understands the risk of undiagnosed coronary      disease.  3. History of bilateral lower extremity pain - We offered ABIs and he      again declined.  4. Remote history of tobacco abuse.  5. Benign prostatic hypertrophy.   It appears that Jon Baker blood pressure would be controlled if he  would take the Norvasc.  I have asked him to follow up with Dr. Alwyn Ren  concerning this issue.  I have instructed him that I  would be happy to see him any time if there are ever any problems, but  it is not clear to me that he would allow testing.  We will see him back  on as-needed basis.     Madolyn Frieze Jon Som, MD, [facility]  Electronically Signed    BSC/MedQ  DD: 02/29/2008  DT: 02/29/2008  Job #: [id number]   cc:   Titus Dubin. Alwyn Ren, MD,FACP,FCCP

## 2011-04-23 NOTE — Assessment & Plan Note (Signed)
[facility]                            CARDIOLOGY OFFICE NOTE   MARL, SEAGO                      MRN:          [id number]  DATE:01/14/2008                            DOB:          February 09, 1932    HISTORY OF PRESENT ILLNESS:  The patient is a 77 year old male who I am  asked to evaluate for hypertension, chest pain and possible  claudication.  He has no prior cardiac history.  He does have some  dyspnea on exertion, but there is no orthopnea, PND or pedal edema.  He  does not have palpitations or syncope.  He has had intermittent chest  pain for several years.  The pain is in various locations on his chest.  Sometimes it begins in his abdomen and radiates to the substernal area.  It can also be in the right chest and left upper chest area.  The pain  is not pleuritic or positional nor is related to food.  He does feel  that it is occasionally indigestion.  There is no associated nausea,  vomiting, shortness of breath or diaphoresis.  It lasts for several  minutes and resolve spontaneously.  Note, it is not exertional.  He does  state that he occasionally notices it more at night and will drink water  with improvement.  He also has some pain in his right calf, right shin  area and right knee with ambulation.  However, this typically occurs  predominantly afterwards.  He was recently noted to have elevated blood  pressure, and we were asked to evaluate for all of those issues.   PRESENT MEDICATIONS:  None other than aspirin.   ALLERGIES:  NO KNOWN DRUG ALLERGIES.   SOCIAL HISTORY:  He does not smoke, although he does have a previous 75-  90-year pack history.  Has not smoked since 1990.  He does not consume  alcohol.   FAMILY HISTORY:  Significant for coronary artery disease in his father.   PAST MEDICAL HISTORY:  There is no diabetes mellitus, but he does have  recently diagnosed hypertension.  There is no hyperlipidemia.  There is  no  other medical problems noted.   PAST SURGICAL HISTORY:  He has had no previous surgeries.  He does have  a history of benign prostatic hypertrophy.   REVIEW OF SYSTEMS:  He denies any headaches or fevers or chills.  There  is no productive cough or hemoptysis.  There is no dysphagia,  odynophagia, melena or hematochezia.  There is no dysuria, hematuria.  There is no rash, seizure activity.  There is no orthopnea, PND or pedal  edema.  Remaining systems are negative.   PHYSICAL EXAMINATION:  VITAL SIGNS:  Blood pressure 190/107.  After  recheck, it was 160/100.  Pulse 76.  He weighs 222 pounds.  He is well-  developed, well-nourished.  No acute distress.  SKIN:  Warm, dry.  Does not appear to be depressed.  There is no  peripheral clubbing.  BACK:  Normal.  HEENT:  Normal with normal eyelids.  NECK:  Supple with normal upstroke bilaterally.  No bruits  heard.  There  is no jugular distention and no thyromegaly is noted.  CHEST:  Clear to auscultation and percussion.  Normal expansion.  CARDIOVASCULAR:  Regular rate and rhythm.  Normal S1-S2.  There are no  murmurs, rubs or gallops noted.  ABDOMEN:  Nontender.  Positive bowel sounds.  No hepatosplenomegaly.  No  mass appreciated.  There is no abdominal bruit.  There are 2+ femoral  pulses bilaterally.  No bruits.  EXTREMITIES:  Show no edema.  I can palpate no cords.  He has 2+  posterior tibial pulses bilaterally.  NEUROLOGICAL:  Grossly intact.   STUDIES:  I do have an electrocardiogram dated January 07, 2008, that  shows a sinus rhythm with a right bundle branch block.   DIAGNOSES:  1. Hypertension - Mr. Millea blood pressure is elevated today.  I      think he would benefit from antihypertensive.  We had long      discussions concerning this issue today.  I will begin Norvasc 5 mg      p.o. daily.  He is somewhat hesitate to take medications, but he is      agreeable.  They will track his blood pressure at home and keep       records for [place].  When he returns in 6 weeks, we will ask him to      bring his cuff so that we can correlate here.  We will then adjust      his regimen as indicated.  I will also most likely check a B-met at      that time as well as a TSH.  He will also need to begin with      modification of his lifestyle including diet, exercise, low-sodium.  2. Chest pain - this is somewhat atypical.  However, I did recommend a      stress Myoview today.  However, he declined.  I did explain the      risks of undiagnosed coronary disease including myocardial      function.  3. Right lower extremity pain - he does have good pulses and this may      not be claudication.  We offered ABIs, but he again declined.  4. Remote history of tobacco use.  5. Benign prostatic hypertrophy.   I will see him back in 6 weeks to review his blood pressure.     Madolyn Frieze Jens Som, MD, [facility]  Electronically Signed    BSC/MedQ  DD: 01/14/2008  DT: 01/15/2008  Job #: [id number]   cc:   Titus Dubin. Alwyn Ren, MD,FACP,FCCP

## 2011-04-23 NOTE — Letter (Signed)
January 04, 2008    Mr. Jon Baker  [address]  [place], [place]  [address]   RE:  QUIN, MATHENIA  MRN:  [id number]  /  DOB:  Jun 21, 1932   Dear Mr. Jon Baker,   I am sorry that you could not stay for me to examine you and address  your hypertension.  The blood pressure of 170/102 would be associated  with an 8 times increased risk of a heart attack or stroke.  This is  because the elevated blood pressure exerts a shear force on any plaque  or atherosclerotic buildup in the arteries.   Thank you for following up 01/07/08. I was pleased with the drop in your  BP ; but I am concerned about your symptoms of chest pain & leg pain  with exertion. Please complete the Cardiology consultation. If that  evaluation  is negative; I see no contraindication to issuance of a DOT  certificate as long as your BP remains < 130/85 on average.  Sincerely,    Sincerely,      Titus Dubin. Alwyn Ren, MD,FACP,FCCP  Electronically Signed    [facility]/MedQ  DD: 01/04/2008  DT: 01/05/2008  Job #: [id number]

## 2011-05-17 ENCOUNTER — Other Ambulatory Visit: Payer: Self-pay | Admitting: Internal Medicine

## 2011-05-17 MED ORDER — AMLODIPINE BESYLATE 5 MG PO TABS: 5.0000 mg | ORAL_TABLET | Freq: Every day | ORAL | Status: DC

## 2011-05-17 NOTE — Telephone Encounter (Signed)
Sent in

## 2011-08-02 ENCOUNTER — Other Ambulatory Visit: Payer: Self-pay | Admitting: Internal Medicine

## 2011-08-02 MED ORDER — AMLODIPINE BESYLATE 5 MG PO TABS: 5.0000 mg | ORAL_TABLET | Freq: Every day | ORAL | Status: DC

## 2011-08-02 NOTE — Telephone Encounter (Signed)
Rx Done . 

## 2011-11-07 ENCOUNTER — Other Ambulatory Visit: Payer: Self-pay | Admitting: Internal Medicine

## 2011-11-07 MED ORDER — AMLODIPINE BESYLATE 5 MG PO TABS: 5.0000 mg | ORAL_TABLET | Freq: Every day | ORAL | Status: DC

## 2011-11-07 NOTE — Telephone Encounter (Signed)
Addended by: Beverely Low on: 11/07/2011 02:25 PM   Modules accepted: Orders

## 2011-11-07 NOTE — Telephone Encounter (Signed)
Last OV 12/24/10. Last filled 08/01/11

## 2011-11-07 NOTE — Telephone Encounter (Signed)
Advise patient, we are calling #[phone], 1 RF. Needs office visit before any other refill

## 2011-11-25 ENCOUNTER — Encounter: Payer: Self-pay | Admitting: Internal Medicine

## 2011-11-26 ENCOUNTER — Ambulatory Visit (INDEPENDENT_AMBULATORY_CARE_PROVIDER_SITE_OTHER): Payer: Medicare Other | Admitting: Internal Medicine

## 2011-11-26 ENCOUNTER — Telehealth: Payer: Self-pay | Admitting: Internal Medicine

## 2011-11-26 VITALS — BP 140/82 | HR 86 | Ht 70.5 in | Wt 215.0 lb

## 2011-11-26 DIAGNOSIS — R21 Rash and other nonspecific skin eruption: Secondary | ICD-10-CM | POA: Insufficient documentation

## 2011-11-26 MED ORDER — TRIAMCINOLONE ACETONIDE 0.1 % EX LOTN: TOPICAL_LOTION | Freq: Two times a day (BID) | CUTANEOUS | Status: AC

## 2011-11-26 MED ORDER — AMLODIPINE BESYLATE 5 MG PO TABS: 5.0000 mg | ORAL_TABLET | Freq: Every day | ORAL | Status: DC

## 2011-11-26 NOTE — Telephone Encounter (Signed)
Patient states that he was seen this morning and forgot to as Dr. Drue Novel to refill his bp med.

## 2011-11-26 NOTE — Telephone Encounter (Signed)
Rx already sent to pharmacy.

## 2011-11-26 NOTE — Patient Instructions (Signed)
Please get records from [facility]: Office visit notes, lab results

## 2011-11-26 NOTE — Progress Notes (Signed)
  Subjective:    Patient ID: Jon Baker, male    DOB: 12-01-1932, 77 y.o.   MRN: [id number]  HPI Here with his wife, he reports the following history: In July 2012, he found many "miniature deer ticks" in his legs, he developed a rash on all the places where he was bite by the ticks; in addition to the rash he did not have any other symptoms. Went to [facility] August 9, was prescribed medication for the rash and it temporarily improved. Went for a second time to the same urgent care, was prescribed doxycycline for 10 days and prednisone with again temporarily relieve for the rash. He went for a third time ~ November 2013 with the rash, he requested a blood test for RMSF which was his main concern. He was called later on they said that this was "positive", he then was referred to dermatology. He asked for a "RMSF" specialist referral but again he was told he needed to see a dermatology, he did not keep that appointment.   Past Medical History: Hypertension DJD, has right  knee pain and back pain PNA 2006 chest pain, 2009, saw  cardiology , decline a  stress test claudication?, 2009, saw  cardiology, declined ABIs  Past Surgical History: 1979 myleogram  R TKR 02-2011, Dr Despina Hick  Family History: DM--no CAD--  M , F (several MIs) pancreatic Ca-- M colon ca--no prostate ca--no  Social History: married 2 children tobacco-- quit after 50 years aprox 1990 ETOH-- no retired   Review of Systems Denies fever, weight loss. Mild headaches No nausea, vomiting or diarrhea (mild diarrhea when he took doxycycline) No arthralgias  or myalgias    Objective:   Physical Exam  Constitutional: He appears well-developed and well-nourished. No distress.  HENT:  Head: Normocephalic and atraumatic.  Cardiovascular: Normal rate, regular rhythm and normal heart sounds.   No murmur heard. Pulmonary/Chest: Effort normal and breath sounds normal. No respiratory distress. He has no  wheezes. He has no rales.  Abdominal: Soft. He exhibits no distension. There is no tenderness. There is no rebound and no guarding.  Skin: Skin is warm and dry. He is not diaphoretic.       Many skin lesions from the waist down, they slightly raised, pink, 1-2 mm in size, I see no blisters, lesions are nonscaly.  Psychiatric: He has a normal mood and affect. His behavior is normal. Judgment and thought content normal.       Assessment & Plan:

## 2011-11-26 NOTE — Assessment & Plan Note (Addendum)
Several months h/o rash Pt concerned about RMSF, reports a "+ blood test" however the chain of events and sx do not point to such diagnosis. Rash may be a protracted allergic reaction related to tick bites or something different like vasculitis. Likely, he   has a degree ot post inflamatory hyperpigmentation as well  Plan: Get records from [facility] Agree w/ derm referral---- refer to derm in [place] trial w/  triamcinolone lotion Addendum: Patient is quite adamant about getting more labs  and see a specialist. I had a long discussion with him, I think the right thing to do is see what was done, order more labs if needed and eventually refer  to ID if necessary.

## 2011-11-27 ENCOUNTER — Encounter: Payer: Self-pay | Admitting: Internal Medicine

## 2011-11-27 ENCOUNTER — Telehealth: Payer: Self-pay | Admitting: *Deleted

## 2011-11-27 NOTE — Telephone Encounter (Signed)
Pt's wife called requesting records returned that were brought in to office 12.18.12; informed caller that we normally send these to interoffice to be scanned in chart. Please advise.

## 2011-11-28 NOTE — Telephone Encounter (Signed)
Discuss with patient, copy of records made and original place up front for Pt to pickup.

## 2011-11-28 NOTE — Telephone Encounter (Signed)
i have the records, make a copy and return them to patient ; as far as the interpretation, I"ll talk with ID, may take few days before I call the patient  back. I don't think he needs anything urgent at this point

## 2011-12-04 NOTE — Telephone Encounter (Signed)
Please advise the patient: I spoke with the ID specialist today, I discussed the case with them and told them about the [facility] mounted spotted fever test ; because the symptoms he had and because R. and [place] is not in this community, no further testing is needed so this is very good news .

## 2011-12-04 NOTE — Telephone Encounter (Signed)
Discuss with patient  

## 2011-12-26 DIAGNOSIS — L259 Unspecified contact dermatitis, unspecified cause: Secondary | ICD-10-CM | POA: Diagnosis not present

## 2012-03-03 ENCOUNTER — Other Ambulatory Visit: Payer: Self-pay | Admitting: Internal Medicine

## 2012-03-03 NOTE — Telephone Encounter (Signed)
Spoke with pharmacist & pt already has a refill on file.

## 2012-04-01 DIAGNOSIS — L259 Unspecified contact dermatitis, unspecified cause: Secondary | ICD-10-CM | POA: Diagnosis not present

## 2012-05-30 ENCOUNTER — Other Ambulatory Visit: Payer: Self-pay | Admitting: Internal Medicine

## 2012-06-01 NOTE — Telephone Encounter (Signed)
Refill done.  

## 2012-12-03 ENCOUNTER — Other Ambulatory Visit: Payer: Self-pay | Admitting: Internal Medicine

## 2012-12-03 NOTE — Telephone Encounter (Signed)
Last time we addressed his blood pressure was 12/24/2010. Call  patient: Will send 30 tablets, no refills Schedule a routine checkup. No further refills without visit

## 2012-12-03 NOTE — Telephone Encounter (Signed)
Pt has not been seen within a year. OK to refill? 

## 2012-12-03 NOTE — Telephone Encounter (Signed)
Refill done.  

## 2012-12-29 DIAGNOSIS — H25099 Other age-related incipient cataract, unspecified eye: Secondary | ICD-10-CM | POA: Diagnosis not present

## 2013-01-04 ENCOUNTER — Ambulatory Visit (INDEPENDENT_AMBULATORY_CARE_PROVIDER_SITE_OTHER): Payer: Medicare Other | Admitting: Internal Medicine

## 2013-01-04 VITALS — BP 132/80 | HR 63 | Temp 98.0°F | Ht 70.0 in | Wt 221.0 lb

## 2013-01-04 DIAGNOSIS — N400 Enlarged prostate without lower urinary tract symptoms: Secondary | ICD-10-CM

## 2013-01-04 DIAGNOSIS — N138 Other obstructive and reflux uropathy: Secondary | ICD-10-CM

## 2013-01-04 DIAGNOSIS — R351 Nocturia: Secondary | ICD-10-CM | POA: Diagnosis not present

## 2013-01-04 DIAGNOSIS — R0989 Other specified symptoms and signs involving the circulatory and respiratory systems: Secondary | ICD-10-CM

## 2013-01-04 DIAGNOSIS — I10 Essential (primary) hypertension: Secondary | ICD-10-CM

## 2013-01-04 DIAGNOSIS — Z125 Encounter for screening for malignant neoplasm of prostate: Secondary | ICD-10-CM

## 2013-01-04 MED ORDER — TAMSULOSIN HCL 0.4 MG PO CAPS: 0.4000 mg | ORAL_CAPSULE | Freq: Every day | ORAL | Status: DC

## 2013-01-04 MED ORDER — LOSARTAN POTASSIUM-HCTZ 50-12.5 MG PO TABS: 1.0000 | ORAL_TABLET | Freq: Every day | ORAL | Status: DC

## 2013-01-04 NOTE — Progress Notes (Signed)
  Subjective:    Patient ID: Jon Baker, male    DOB: 1932/06/21, 79 y.o.   MRN: [id number]  HPI Here for a refill of amlodipine, last office visit more than a year ago. Has been on amlodipine for a while, has not checked his BP at any time in the last year. On further questioning, he admits to dyspnea on exertion: This started about 2 years ago, he gets consistently short of breath whenever he walks 100 feet or more. He denies chest pain or palpitation. Wife reports that he wheezes constantly, the patient reports that he feels  wheeze now (however lung exam during the visit is normal) Also reports nocturia 3-4 times every night.  Past Medical History: Hypertension DJD, has right  knee pain and back pain PNA 2006 chest pain, 2009, saw  cardiology , decline a  stress test claudication?, 2009, saw  cardiology, declined ABIs  Past Surgical History: 1979 myleogram   R TKR 02-2011, Dr Despina Hick  Family History: DM--no CAD--  M , F (several MIs) pancreatic Ca-- M colon ca--no prostate ca--no  Social History: married 2 children tobacco-- quit after 50 years aprox 1990 ETOH-- no retired    Review of Systems Some lower extremity edema, worse at the end of the day. No nausea, vomiting, diarrhea or blood in the stools. Denies chest pain or palpitations. No dysuria gross hematuria. No difficulty urinating. No orthopnea, uses one pillow to sleep. When asked, admits to snoring and feeling tired  every day, occasionally falls sleep easy    Objective:   Physical Exam General -- alert, well-developed  Neck --no thyromegaly, no JVD at 45 HEENT-- not pale Lungs -- normal respiratory effort, no intercostal retractions, no accessory muscle use, and normal breath sounds.   Heart-- normal rate, regular rhythm, no murmur, and no gallop.   Abdomen--soft, non-tender, no distention, no masses, no HSM, no guarding, and no rigidity.   Extremities-- no pretibial edema bilaterally Rectal-- No  external abnormalities noted. Normal sphincter tone. No rectal masses or tenderness. Brown stool Prostate:  Prostate symmetrically enlarged, not tender or nodular. Neurologic-- alert & oriented X3 and strength normal in all extremities. Psych-- Cognition and judgment appear intact. Alert and cooperative with normal attention span and concentration.  not anxious appearing and not depressed appearing.        Assessment & Plan:  Declined a flu shot

## 2013-01-04 NOTE — Patient Instructions (Addendum)
Please get your x-ray at the other [place]  office located at: [address] [place], across from [facility].  Please go to the basement, this is a walk-in facility, they are open from 8:30 to 5:30 PM. Phone number [phone]. ----- Start amlodipine, start losartan HCTZ Call if side effects Start Flomax for the urinary symptoms Please come back in 3 weeks

## 2013-01-05 ENCOUNTER — Encounter: Payer: Self-pay | Admitting: *Deleted

## 2013-01-05 ENCOUNTER — Ambulatory Visit (INDEPENDENT_AMBULATORY_CARE_PROVIDER_SITE_OTHER)
Admission: RE | Admit: 2013-01-05 | Discharge: 2013-01-05 | Disposition: A | Discharge status: Home / Self Care | Payer: Medicare Other | Source: Ambulatory Visit | Attending: Internal Medicine | Admitting: Internal Medicine

## 2013-01-05 DIAGNOSIS — R0609 Other forms of dyspnea: Secondary | ICD-10-CM | POA: Diagnosis not present

## 2013-01-05 DIAGNOSIS — R0989 Other specified symptoms and signs involving the circulatory and respiratory systems: Secondary | ICD-10-CM | POA: Diagnosis not present

## 2013-01-05 DIAGNOSIS — J449 Chronic obstructive pulmonary disease, unspecified: Secondary | ICD-10-CM | POA: Diagnosis not present

## 2013-01-05 LAB — CBC WITH DIFFERENTIAL/PLATELET
Basophils Relative: 1.1 % (ref 0.0–3.0)
Eosinophils Relative: 4.8 % (ref 0.0–5.0)
HCT: 45.6 % (ref 39.0–52.0)
Hemoglobin: 15.8 g/dL (ref 13.0–17.0)
Lymphocytes Relative: 23.5 % (ref 12.0–46.0)
Lymphs Abs: 1.9 10*3/uL (ref 0.7–4.0)
Monocytes Relative: 13.5 % — ABNORMAL HIGH (ref 3.0–12.0)
Neutro Abs: 4.7 10*3/uL (ref 1.4–7.7)
RBC: 4.98 Mil/uL (ref 4.22–5.81)

## 2013-01-05 LAB — URINALYSIS, ROUTINE W REFLEX MICROSCOPIC
Hgb urine dipstick: NEGATIVE
Nitrite: NEGATIVE
Specific Gravity, Urine: 1.02 (ref 1.000–1.030)
Total Protein, Urine: NEGATIVE
Urine Glucose: NEGATIVE
pH: 6 (ref 5.0–8.0)

## 2013-01-05 LAB — BASIC METABOLIC PANEL
CO2: 31 mEq/L (ref 19–32)
Calcium: 9.4 mg/dL (ref 8.4–10.5)
GFR: 75.8 mL/min (ref >60.00)
Glucose, Bld: 95 mg/dL (ref 70–99)
Potassium: 4.8 mEq/L (ref 3.5–5.1)
Sodium: 139 mEq/L (ref 135–145)

## 2013-01-06 ENCOUNTER — Encounter: Payer: Self-pay | Admitting: Internal Medicine

## 2013-01-06 DIAGNOSIS — J449 Chronic obstructive pulmonary disease, unspecified: Secondary | ICD-10-CM | POA: Insufficient documentation

## 2013-01-06 NOTE — Assessment & Plan Note (Signed)
Dyspnea on exertion, unclear etiology, will get  basic blood work, a chest x-ray and PFTs , he was a heavy smoker for many years.. Reassess in 2 weeks, consider cards eval/ecocardiogram

## 2013-01-06 NOTE — Assessment & Plan Note (Signed)
Hypertension, seems to be well controlled but we are going to change amlodipine to 2 edema.

## 2013-01-06 NOTE — Assessment & Plan Note (Signed)
BPH, Nocturia and mild increase in the prostate size. Will get a PSA and start Flomax

## 2013-01-23 ENCOUNTER — Other Ambulatory Visit: Payer: Self-pay | Admitting: Internal Medicine

## 2013-01-25 ENCOUNTER — Ambulatory Visit (INDEPENDENT_AMBULATORY_CARE_PROVIDER_SITE_OTHER): Payer: Medicare Other | Admitting: Internal Medicine

## 2013-01-25 ENCOUNTER — Encounter: Payer: Self-pay | Admitting: Internal Medicine

## 2013-01-25 VITALS — BP 130/78 | HR 68 | Temp 97.9°F | Wt 220.0 lb

## 2013-01-25 DIAGNOSIS — R0989 Other specified symptoms and signs involving the circulatory and respiratory systems: Secondary | ICD-10-CM | POA: Diagnosis not present

## 2013-01-25 DIAGNOSIS — R0609 Other forms of dyspnea: Secondary | ICD-10-CM | POA: Diagnosis not present

## 2013-01-25 DIAGNOSIS — N138 Other obstructive and reflux uropathy: Secondary | ICD-10-CM

## 2013-01-25 DIAGNOSIS — I10 Essential (primary) hypertension: Secondary | ICD-10-CM

## 2013-01-25 DIAGNOSIS — N401 Enlarged prostate with lower urinary tract symptoms: Secondary | ICD-10-CM

## 2013-01-25 LAB — BASIC METABOLIC PANEL
BUN: 16 mg/dL (ref 6–23)
GFR: 70.15 mL/min (ref >60.00)
Potassium: 4 mEq/L (ref 3.5–5.1)
Sodium: 138 mEq/L (ref 135–145)

## 2013-01-25 MED ORDER — ACLIDINIUM BROMIDE 400 MCG/ACT IN AEPB: 1.0000 | INHALATION_SPRAY | Freq: Two times a day (BID) | RESPIRATORY_TRACT | Status: DC

## 2013-01-25 MED ORDER — LOSARTAN POTASSIUM-HCTZ 50-12.5 MG PO TABS: 1.0000 | ORAL_TABLET | Freq: Every day | ORAL | Status: DC

## 2013-01-25 NOTE — Progress Notes (Signed)
  Subjective:    Patient ID: Jon Baker, male    DOB: [birth year], 79 y.o.   MRN: [id number]  HPI Followup from previous visit. HTN,  good compliance with new medicines, nonproductive BPs, edema better.   Past Medical History  Diagnosis Date  . DJD (degenerative joint disease)   . Chest pain     2009, saw  cardiology , decline a  stress test  . HTN (hypertension)   . Claudication     2009, saw  cardiology, declined ABIs    Past Surgical History  Procedure Laterality Date  . Myleogram  1979  . Total knee arthroplasty Right 02-2011    Dr Despina Hick   Review of Systems Denies any cough, continue with dyspnea on exertion as before. Taking Flomax, still has urinary frequency, has noted that nocturia is better if he sleeps in a recliner. Denies orthopnea per se. No chest pain.     Objective:   Physical Exam General -- alert, well-developed Lungs -- normal respiratory effort, no intercostal retractions, no accessory muscle use, and few end expiratory wheezes bilaterally  Heart-- normal rate, regular rhythm, no murmur, and no gallop.   Extremities-- no pretibial edema bilaterally  Psych-- Cognition and judgment appear intact. Alert and cooperative with normal attention span and concentration.  not anxious appearing and not depressed appearing.      Assessment & Plan:

## 2013-01-25 NOTE — Patient Instructions (Addendum)
After you have your pulmonary tests this week, start Tudorza one puff twice a day, see if that helps your difficulty breathing. I will schedule an echocardiogram to check the  heart.

## 2013-01-25 NOTE — Assessment & Plan Note (Addendum)
Continue with dyspnea on exertion, chest x-ray showed changes of emphysema. PFTs pending. Plan:  Start a trial with Tudorza bid, after PFTs; sample and a Rx provided Echocardiogram

## 2013-01-25 NOTE — Assessment & Plan Note (Signed)
Continue with some symptoms, his PSA was slightly elevated, after we finished the workup for his breathing, will consider referred to urology

## 2013-01-25 NOTE — Assessment & Plan Note (Signed)
Tolerating losartan HCT well. BP today very good. Plan: BMP, refill meds.

## 2013-01-26 ENCOUNTER — Encounter: Payer: Self-pay | Admitting: *Deleted

## 2013-01-26 NOTE — Telephone Encounter (Signed)
Refill done.  

## 2013-01-28 ENCOUNTER — Ambulatory Visit (INDEPENDENT_AMBULATORY_CARE_PROVIDER_SITE_OTHER): Payer: Medicare Other | Admitting: Internal Medicine

## 2013-01-28 DIAGNOSIS — R0989 Other specified symptoms and signs involving the circulatory and respiratory systems: Secondary | ICD-10-CM

## 2013-01-28 DIAGNOSIS — R0609 Other forms of dyspnea: Secondary | ICD-10-CM | POA: Diagnosis not present

## 2013-01-28 LAB — PULMONARY FUNCTION TEST

## 2013-01-28 NOTE — Progress Notes (Signed)
PFT done today. 

## 2013-02-03 ENCOUNTER — Ambulatory Visit (HOSPITAL_COMMUNITY): Payer: Medicare Other | Attending: Internal Medicine

## 2013-02-03 DIAGNOSIS — R0609 Other forms of dyspnea: Secondary | ICD-10-CM | POA: Insufficient documentation

## 2013-02-03 DIAGNOSIS — R0989 Other specified symptoms and signs involving the circulatory and respiratory systems: Secondary | ICD-10-CM | POA: Insufficient documentation

## 2013-02-03 DIAGNOSIS — E785 Hyperlipidemia, unspecified: Secondary | ICD-10-CM | POA: Diagnosis not present

## 2013-02-03 DIAGNOSIS — I10 Essential (primary) hypertension: Secondary | ICD-10-CM | POA: Diagnosis not present

## 2013-02-03 NOTE — Progress Notes (Signed)
Echocardiogram performed.  

## 2013-02-04 ENCOUNTER — Telehealth: Payer: Self-pay | Admitting: Internal Medicine

## 2013-02-04 NOTE — Telephone Encounter (Signed)
Advise patient: PFTs confirm mild COPD, echocardiogram normal. Plan is to use [place] as recommended and come back in 6 months for reassessment. If there is no improvement in his breathing in the next month, patient to let me know. ------  PFTs 01/28/2013: Moderate obstructions, insignificant response to bronchodilators. Normal volumes and diffusion. Echocardiogram 02/03/2013: Left ventricle: The cavity size was normal. Wall thickness was normal. Systolic function was normal. The estimated ejection fraction was in the range of 55% to 60%. Wall motion was normal; there were no regional wall motion abnormalities.

## 2013-02-05 NOTE — Telephone Encounter (Signed)
lmovm for pt to return call.  

## 2013-02-09 NOTE — Telephone Encounter (Signed)
Discussed with pt

## 2013-02-28 ENCOUNTER — Other Ambulatory Visit: Payer: Self-pay | Admitting: Internal Medicine

## 2013-03-01 NOTE — Telephone Encounter (Signed)
Spoke with [facility] @ pharmacy & she confirmed pt still has refills left on file.  Refill request sent in error.

## 2013-04-26 DIAGNOSIS — R1013 Epigastric pain: Secondary | ICD-10-CM | POA: Diagnosis not present

## 2013-05-09 DIAGNOSIS — K851 Biliary acute pancreatitis without necrosis or infection: Secondary | ICD-10-CM

## 2013-05-09 HISTORY — DX: Biliary acute pancreatitis without necrosis or infection: K85.10

## 2013-06-04 ENCOUNTER — Emergency Department (HOSPITAL_COMMUNITY): Payer: Medicare Other

## 2013-06-04 ENCOUNTER — Encounter (HOSPITAL_COMMUNITY): Payer: Self-pay | Admitting: Emergency Medicine

## 2013-06-04 ENCOUNTER — Inpatient Hospital Stay (HOSPITAL_COMMUNITY)
Admission: EM | Admit: 2013-06-04 | Discharge: 2013-06-08 | DRG: 419 | Disposition: A | Discharge status: Home / Self Care | Payer: Medicare Other | Attending: Internal Medicine | Admitting: Internal Medicine

## 2013-06-04 ENCOUNTER — Other Ambulatory Visit: Payer: Self-pay

## 2013-06-04 DIAGNOSIS — Z96659 Presence of unspecified artificial knee joint: Secondary | ICD-10-CM | POA: Diagnosis not present

## 2013-06-04 DIAGNOSIS — R918 Other nonspecific abnormal finding of lung field: Secondary | ICD-10-CM | POA: Diagnosis not present

## 2013-06-04 DIAGNOSIS — Z79899 Other long term (current) drug therapy: Secondary | ICD-10-CM | POA: Diagnosis not present

## 2013-06-04 DIAGNOSIS — K859 Acute pancreatitis without necrosis or infection, unspecified: Secondary | ICD-10-CM | POA: Diagnosis not present

## 2013-06-04 DIAGNOSIS — K851 Biliary acute pancreatitis without necrosis or infection: Secondary | ICD-10-CM

## 2013-06-04 DIAGNOSIS — K8020 Calculus of gallbladder without cholecystitis without obstruction: Secondary | ICD-10-CM | POA: Diagnosis present

## 2013-06-04 DIAGNOSIS — Z87891 Personal history of nicotine dependence: Secondary | ICD-10-CM | POA: Diagnosis not present

## 2013-06-04 DIAGNOSIS — I10 Essential (primary) hypertension: Secondary | ICD-10-CM | POA: Diagnosis not present

## 2013-06-04 DIAGNOSIS — R1013 Epigastric pain: Secondary | ICD-10-CM | POA: Diagnosis not present

## 2013-06-04 DIAGNOSIS — J449 Chronic obstructive pulmonary disease, unspecified: Secondary | ICD-10-CM | POA: Diagnosis present

## 2013-06-04 DIAGNOSIS — J4489 Other specified chronic obstructive pulmonary disease: Secondary | ICD-10-CM | POA: Diagnosis not present

## 2013-06-04 DIAGNOSIS — K429 Umbilical hernia without obstruction or gangrene: Secondary | ICD-10-CM | POA: Diagnosis present

## 2013-06-04 DIAGNOSIS — R079 Chest pain, unspecified: Secondary | ICD-10-CM | POA: Diagnosis not present

## 2013-06-04 DIAGNOSIS — R112 Nausea with vomiting, unspecified: Secondary | ICD-10-CM | POA: Diagnosis not present

## 2013-06-04 DIAGNOSIS — K8010 Calculus of gallbladder with chronic cholecystitis without obstruction: Secondary | ICD-10-CM | POA: Diagnosis not present

## 2013-06-04 DIAGNOSIS — I739 Peripheral vascular disease, unspecified: Secondary | ICD-10-CM | POA: Diagnosis present

## 2013-06-04 DIAGNOSIS — K8000 Calculus of gallbladder with acute cholecystitis without obstruction: Secondary | ICD-10-CM | POA: Diagnosis not present

## 2013-06-04 DIAGNOSIS — N281 Cyst of kidney, acquired: Secondary | ICD-10-CM | POA: Diagnosis not present

## 2013-06-04 HISTORY — DX: Chronic obstructive pulmonary disease, unspecified: J44.9

## 2013-06-04 LAB — CBC WITH DIFFERENTIAL/PLATELET
Basophils Absolute: 0 K/uL (ref 0.0–0.1)
Basophils Relative: 0 % (ref 0–1)
Eosinophils Absolute: 0 K/uL (ref 0.0–0.7)
Eosinophils Relative: 0 % (ref 0–5)
HCT: 45.1 % (ref 39.0–52.0)
HCT: 44.9 % (ref 39.0–52.0)
Hemoglobin: 16.4 g/dL (ref 13.0–17.0)
Hemoglobin: 15.7 g/dL (ref 13.0–17.0)
Lymphocytes Relative: 13 % (ref 12–46)
Lymphocytes Relative: 7 % — ABNORMAL LOW (ref 12–46)
Lymphs Abs: 1 K/uL (ref 0.7–4.0)
MCH: 31.4 pg (ref 26.0–34.0)
MCHC: 36.4 g/dL — ABNORMAL HIGH (ref 30.0–36.0)
MCHC: 35 g/dL (ref 30.0–36.0)
MCV: 89.1 fL (ref 78.0–100.0)
MCV: 89.8 fL (ref 78.0–100.0)
Monocytes Absolute: 0.9 10*3/uL (ref 0.1–1.0)
Monocytes Absolute: 0.8 K/uL (ref 0.1–1.0)
Monocytes Relative: 6 % (ref 3–12)
Monocytes Relative: 6 % (ref 3–12)
Neutro Abs: 11.7 10*3/uL — ABNORMAL HIGH (ref 1.7–7.7)
Neutro Abs: 13.5 K/uL — ABNORMAL HIGH (ref 1.7–7.7)
Neutrophils Relative %: 88 % — ABNORMAL HIGH (ref 43–77)
Platelets: 255 K/uL (ref 150–400)
RBC: 5 MIL/uL (ref 4.22–5.81)
RDW: 12.7 % (ref 11.5–15.5)
WBC: 14.6 10*3/uL — ABNORMAL HIGH (ref 4.0–10.5)
WBC: 15.4 K/uL — ABNORMAL HIGH (ref 4.0–10.5)

## 2013-06-04 LAB — PROTIME-INR
INR: 1.05 (ref 0.00–1.49)
Prothrombin Time: 13.5 seconds (ref 11.6–15.2)

## 2013-06-04 LAB — LIPID PANEL
Cholesterol: 172 mg/dL (ref 0–200)
HDL: 34 mg/dL — ABNORMAL LOW (ref >39)
Total CHOL/HDL Ratio: 5.1 RATIO
Triglycerides: 54 mg/dL (ref <150)
VLDL: 11 mg/dL (ref 0–40)

## 2013-06-04 LAB — CG4 I-STAT (LACTIC ACID): Lactic Acid, Venous: 1.37 mmol/L (ref 0.5–2.2)

## 2013-06-04 LAB — COMPREHENSIVE METABOLIC PANEL
ALT: 364 U/L — ABNORMAL HIGH (ref 0–53)
AST: 477 U/L — ABNORMAL HIGH (ref 0–37)
Albumin: 3.9 g/dL (ref 3.5–5.2)
Alkaline Phosphatase: 121 U/L — ABNORMAL HIGH (ref 39–117)
BUN: 21 mg/dL (ref 6–23)
CO2: 29 mEq/L (ref 19–32)
Calcium: 8.9 mg/dL (ref 8.4–10.5)
Chloride: 101 mEq/L (ref 96–112)
Creatinine, Ser: 1.03 mg/dL (ref 0.50–1.35)
GFR calc Af Amer: 78 mL/min — ABNORMAL LOW (ref >90)
GFR calc non Af Amer: 67 mL/min — ABNORMAL LOW (ref >90)
Glucose, Bld: 186 mg/dL — ABNORMAL HIGH (ref 70–99)
Potassium: 3.3 mEq/L — ABNORMAL LOW (ref 3.5–5.1)
Sodium: 140 mEq/L (ref 135–145)
Total Bilirubin: 1.7 mg/dL — ABNORMAL HIGH (ref 0.3–1.2)
Total Protein: 6.9 g/dL (ref 6.0–8.3)

## 2013-06-04 LAB — LIPASE, BLOOD: Lipase: >3000 U/L — ABNORMAL HIGH (ref 11–59)

## 2013-06-04 LAB — POCT I-STAT TROPONIN I: Troponin i, poc: 0 ng/mL (ref 0.00–0.08)

## 2013-06-04 LAB — BASIC METABOLIC PANEL
CO2: 29 mEq/L (ref 19–32)
Calcium: 8.7 mg/dL (ref 8.4–10.5)
Chloride: 102 mEq/L (ref 96–112)
Sodium: 139 mEq/L (ref 135–145)

## 2013-06-04 LAB — HEPATIC FUNCTION PANEL
Indirect Bilirubin: 0.9 mg/dL (ref 0.3–0.9)
Total Protein: 6.6 g/dL (ref 6.0–8.3)

## 2013-06-04 LAB — SAMPLE TO BLOOD BANK

## 2013-06-04 LAB — GLUCOSE, CAPILLARY: Glucose-Capillary: 91 mg/dL (ref 70–99)

## 2013-06-04 MED ORDER — ONDANSETRON HCL 4 MG/2ML IJ SOLN: 4.0000 mg | Freq: Three times a day (TID) | INTRAMUSCULAR | Status: DC | PRN

## 2013-06-04 MED ORDER — ACETAMINOPHEN 325 MG PO TABS: 650.0000 mg | ORAL_TABLET | Freq: Four times a day (QID) | ORAL | Status: DC | PRN

## 2013-06-04 MED ORDER — SODIUM CHLORIDE 0.9 % IV SOLN
INTRAVENOUS | Status: AC
  Administered 2013-06-04 – 2013-06-05 (×3): via INTRAVENOUS

## 2013-06-04 MED ORDER — FENTANYL CITRATE 0.05 MG/ML IJ SOLN
50.0000 ug | Freq: Once | INTRAMUSCULAR | Status: AC
  Administered 2013-06-04: 03:00:00 via INTRAVENOUS
  Filled 2013-06-04: qty 2

## 2013-06-04 MED ORDER — HYDROMORPHONE HCL PF 1 MG/ML IJ SOLN
1.0000 mg | INTRAMUSCULAR | Status: DC | PRN
  Administered 2013-06-04: 1 mg via INTRAVENOUS
  Filled 2013-06-04: qty 1

## 2013-06-04 MED ORDER — HYDRALAZINE HCL 20 MG/ML IJ SOLN: 10.0000 mg | INTRAMUSCULAR | Status: DC | PRN

## 2013-06-04 MED ORDER — ONDANSETRON HCL 4 MG/2ML IJ SOLN
4.0000 mg | Freq: Four times a day (QID) | INTRAMUSCULAR | Status: DC | PRN
  Administered 2013-06-04: 4 mg via INTRAVENOUS
  Filled 2013-06-04: qty 2

## 2013-06-04 MED ORDER — ONDANSETRON HCL 4 MG/2ML IJ SOLN
4.0000 mg | Freq: Once | INTRAMUSCULAR | Status: AC
  Administered 2013-06-04: 4 mg via INTRAVENOUS
  Filled 2013-06-04: qty 2

## 2013-06-04 MED ORDER — ACETAMINOPHEN 650 MG RE SUPP: 650.0000 mg | Freq: Four times a day (QID) | RECTAL | Status: DC | PRN

## 2013-06-04 MED ORDER — ONDANSETRON HCL 4 MG PO TABS: 4.0000 mg | ORAL_TABLET | Freq: Four times a day (QID) | ORAL | Status: DC | PRN

## 2013-06-04 MED ORDER — SODIUM CHLORIDE 0.9 % IJ SOLN
3.0000 mL | Freq: Two times a day (BID) | INTRAMUSCULAR | Status: DC
  Administered 2013-06-05 – 2013-06-07 (×4): 3 mL via INTRAVENOUS

## 2013-06-04 NOTE — ED Provider Notes (Signed)
History    CSN: [id number] Arrival date & time 06/04/13  0201  First MD Initiated Contact with Patient 06/04/13 0210     Chief Complaint  Patient presents with  . Chest Pain   (Consider location/radiation/quality/duration/timing/severity/associated sxs/prior Treatment) HPI 79 yo male presents to the ER from home with complaint of epigastric pain starting around 8 pm.  Wife reports he has been complaining of upper abdominal pain for "a while", probably about a week.  She reports he started vomiting tonight around 11 pm, and has been unable to stop.  No fevers, chills, possible diarrhea tonight.  No sick contacts.  Pt became very pale and diaphoretic in triage, reported needing to vomit again.  Pt became bradycardic into the 30's.  Once back in exam room, hr improved to 60s.  No prior h/o same.  He denies any radiation of pain into back or chest.  Past Medical History  Diagnosis Date  . DJD (degenerative joint disease)   . Chest pain     2009, saw  cardiology , decline a  stress test  . HTN (hypertension)   . Claudication     2009, saw  cardiology, declined ABIs   Past Surgical History  Procedure Laterality Date  . Myleogram  1979  . Total knee arthroplasty Right 02-2011    Dr Despina Hick   Family History  Problem Relation Age of Onset  . Coronary artery disease Mother   . Coronary artery disease Father     several MI'S  . Diabetes Neg Hx   . Cancer Neg Hx    History  Substance Use Topics  . Smoking status: Former Games developer  . Smokeless tobacco: Not on file  . Alcohol Use: No    Review of Systems  Unable to perform ROS: Acuity of condition    Allergies  Review of patient's allergies indicates no known allergies.  Home Medications   Current Outpatient Rx  Name  Route  Sig  Dispense  Refill  . Aclidinium Bromide 400 MCG/ACT AEPB   Inhalation   Inhale 1 puff into the lungs 2 (two) times daily.   1 each   6   . amLODipine (NORVASC) 5 MG tablet      TAKE 1 TABLET (5  MG TOTAL) BY MOUTH DAILY.   30 tablet   6   . losartan-hydrochlorothiazide (HYZAAR) 50-12.5 MG per tablet   Oral   Take 1 tablet by mouth daily.   30 tablet   6   . Tamsulosin HCl (FLOMAX) 0.4 MG CAPS   Oral   Take 1 capsule (0.4 mg total) by mouth daily.   30 capsule   3    BP 98/60  Temp(Src) 97.5 F (36.4 C) (Oral)  Resp 16  Ht 5' 10.5" (1.791 m)  Wt 195 lb (88.451 kg)  BMI 27.57 kg/m2  SpO2 89% Physical Exam  Constitutional: He appears distressed.  Frail, elderly  HENT:  Head: Normocephalic and atraumatic.  Nose: Nose normal.  Mouth/Throat: Oropharynx is clear and moist. No oropharyngeal exudate.  Eyes: Conjunctivae and EOM are normal. Pupils are equal, round, and reactive to light.  Neck: Normal range of motion. Neck supple. No JVD present. No tracheal deviation present. No thyromegaly present.  Cardiovascular: Regular rhythm, normal heart sounds and intact distal pulses.  Exam reveals no gallop and no friction rub.   No murmur heard. bradycardia  Pulmonary/Chest: Effort normal and breath sounds normal. No stridor. No respiratory distress. He has no wheezes. He has  no rales. He exhibits no tenderness.  Abdominal: Soft. He exhibits no distension and no mass. There is tenderness (significant tenderness in epigastrium, no pulsatile mass noted). There is no rebound and no guarding.  Hypoactive bowel sounds  Musculoskeletal: Normal range of motion. He exhibits no edema and no tenderness.  Lymphadenopathy:    He has no cervical adenopathy.  Neurological: He is alert. He displays normal reflexes. He exhibits normal muscle tone. Coordination normal.  Skin: Skin is warm. No rash noted. He is diaphoretic. No erythema.    ED Course  Procedures (including critical care time) Labs Reviewed  CBC WITH DIFFERENTIAL - Abnormal; Notable for the following:    WBC 14.6 (*)    MCHC 36.4 (*)    Neutrophils Relative % 80 (*)    Neutro Abs 11.7 (*)    All other components within  normal limits  COMPREHENSIVE METABOLIC PANEL - Abnormal; Notable for the following:    Potassium 3.3 (*)    Glucose, Bld 186 (*)    AST 477 (*)    ALT 364 (*)    Alkaline Phosphatase 121 (*)    Total Bilirubin 1.7 (*)    GFR calc non Af Amer 67 (*)    GFR calc Af Amer 78 (*)    All other components within normal limits  LIPASE, BLOOD - Abnormal; Notable for the following:    Lipase >3000 (*)    All other components within normal limits  PROTIME-INR  URINALYSIS, ROUTINE W REFLEX MICROSCOPIC  CG4 I-STAT (LACTIC ACID)  POCT I-STAT TROPONIN I  SAMPLE TO BLOOD BANK    Date: 06/04/2013  Rate: 40  Rhythm: sinus bradycardia  QRS Axis: normal  Intervals: normal  ST/T Wave abnormalities: normal  Conduction Disutrbances:right bundle branch block  Narrative Interpretation: baseline wander  Old EKG Reviewed: none available   Date: 06/04/2013  Rate:58  Rhythm: sinus bradycardia and sinus arrhythmia  QRS Axis: normal  Intervals: normal  ST/T Wave abnormalities: normal  Conduction Disutrbances:none  Narrative Interpretation: rate increased from prior  Old EKG Reviewed: changes noted      US Abdomen Complete  06/04/2013   *RADIOLOGY REPORT*  Clinical Data:  Elevated LFTs.  COMPLETE ABDOMINAL ULTRASOUND  Comparison:  None.  Findings:  Gallbladder:  Multiple gallstones within the gallbladder.  Sludge also noted.  Gallbladder wall upper normal in thickness at 2.4 mm. Negative sonographic Murphy's.  Common bile duct:   Normal caliber, 6 mm.  No visible ductal stones.  The distal duct is not visualized due to overlying bowel gas.  Liver:  No focal lesion identified.  Within normal limits in parenchymal echogenicity.  IVC:  Appears normal.  Pancreas:  No focal abnormality seen.  Spleen:  Within normal limits in size and echotexture.  Right Kidney:   2.7 cm benign-appearing cyst superolaterally.  No hydronephrosis.  Normal echotexture.  Left Kidney:  Multiple cysts, the largest 3.1 cm superior  laterally.  No hydronephrosis.  Normal echotexture.  Abdominal aorta:  No aneurysm identified.  IMPRESSION: Cholelithiasis.  No sonographic evidence of acute cholecystitis. No visible ductal stones.  The distal duct cannot be visualized.  Bilateral renal cysts.   Original Report Authenticated By: Charlett Nose, M.D.   Dg Chest Port 1 View  06/04/2013   *RADIOLOGY REPORT*  Clinical Data: Upper abdominal pain.  PORTABLE CHEST - 1 VIEW  Comparison: 01/05/2013  Findings: Mild hyperinflation of the lungs.  Heart is upper limits normal in size.  No confluent airspace opacities.  No  effusions. No acute bony abnormality.  IMPRESSION: Mild hyperinflation.  No acute findings.   Original Report Authenticated By: Charlett Nose, M.D.   1. Gallstone pancreatitis     MDM  79 yo male with epigastric pain, possible vagal episode in triage.  Pt ill appearing.  Differential is broad, includes AAA, dissection, MI, pancreatitis, cholelithiasis/cholecystitis, ischemic bowel.  Will get labwork, chest xray.  Will continue to monitor closely.   Olivia Mackie, MD 06/04/13 [id number]

## 2013-06-04 NOTE — H&P (Signed)
[facility] History and Physical  Jon Baker ZOX:[id number] DOB: 1932-05-04 DOA: 06/04/2013  Referring physician: ER physician. PCP: Jon Ora, MD   Chief Complaint: Abdominal pain and nausea vomiting and diarrhea.  HPI: Jon Baker is a 79 y.o. male chest history of hypertension presented to the ER because of abdominal pain. Patient's pain is mostly epigastric area. Pain started off last night suddenly severe with nausea vomiting. Early in the day patient had multiple episodes of diarrhea. Since the pain was persistent patient came to the ER. In the ER labs show elevated LFTs and lipase. Sonogram shows gallstones. At this time patient has been admitted for possible gallstone pancreatitis. Patient denies any fever chills. Denies using any recent antibiotics or any new medications. Presently patient is not short of breath and hemodynamically stable.  Review of Systems: As presented in the history of presenting illness, rest negative.  Past Medical History  Diagnosis Date  . DJD (degenerative joint disease)   . Chest pain     2009, saw  cardiology , decline a  stress test  . HTN (hypertension)   . Claudication     2009, saw  cardiology, declined ABIs   Past Surgical History  Procedure Laterality Date  . Myleogram  1979  . Total knee arthroplasty Right 02-2011    Dr Jon Baker   Social History:  reports that he has quit smoking. He does not have any smokeless tobacco history on file. He reports that he does not drink alcohol. His drug history is not on file. Home. where does patient live-- Can do ADLs. Can patient participate in ADLs?  No Known Allergies  Family History  Problem Relation Age of Onset  . Coronary artery disease Mother   . Coronary artery disease Father     several MI'S  . Diabetes Neg Hx   . Cancer Neg Hx       Prior to Admission medications   Medication Sig Start Date End Date Taking? Authorizing Provider  losartan-hydrochlorothiazide (HYZAAR)  50-12.5 MG per tablet Take 1 tablet by mouth daily. 01/25/13  Yes Jon Plump, MD   Physical Exam: Filed Vitals:   06/04/13 0445 06/04/13 0500 06/04/13 0515 06/04/13 0530  BP: 165/96 151/97 139/75 150/92  Pulse: 69 71 68 74  Temp:      TempSrc:      Resp: 19 23 19 22   Height:      Weight:      SpO2: 95% 93% 94% 93%     General:  Well-developed and nourished.  Eyes: Anicteric no pallor.  ENT: No discharge from ears eyes nose mouth.  Neck: No mass felt.  Cardiovascular: S1-S2 heard.  Respiratory: No rhonchi or crepitations.  Abdomen: Soft nontender bowel sounds present.  Skin: No rash.  Musculoskeletal: No edema.  Psychiatric: Appears normal.  Neurologic: Alert awake oriented to time place and person. Moves all extremities.  Labs on Admission:  Basic Metabolic Panel:  Recent Labs Lab 06/04/13 0213  NA 140  K 3.3*  CL 101  CO2 29  GLUCOSE 186*  BUN 21  CREATININE 1.03  CALCIUM 8.9   Liver Function Tests:  Recent Labs Lab 06/04/13 0213  AST 477*  ALT 364*  ALKPHOS 121*  BILITOT 1.7*  PROT 6.9  ALBUMIN 3.9    Recent Labs Lab 06/04/13 0213  LIPASE >3000*   No results found for this basename: AMMONIA,  in the last 168 hours CBC:  Recent Labs Lab 06/04/13 0213  WBC 14.6*  NEUTROABS  11.7*  HGB 16.4  HCT 45.1  MCV 89.1  PLT 265   Cardiac Enzymes: No results found for this basename: CKTOTAL, CKMB, CKMBINDEX, TROPONINI,  in the last 168 hours  BNP (last 3 results) No results found for this basename: PROBNP,  in the last 8760 hours CBG: No results found for this basename: GLUCAP,  in the last 168 hours  Radiological Exams on Admission: US Abdomen Complete  06/04/2013   *RADIOLOGY REPORT*  Clinical Data:  Elevated LFTs.  COMPLETE ABDOMINAL ULTRASOUND  Comparison:  None.  Findings:  Gallbladder:  Multiple gallstones within the gallbladder.  Sludge also noted.  Gallbladder wall upper normal in thickness at 2.4 mm. Negative sonographic  Murphy's.  Common bile duct:   Normal caliber, 6 mm.  No visible ductal stones.  The distal duct is not visualized due to overlying bowel gas.  Liver:  No focal lesion identified.  Within normal limits in parenchymal echogenicity.  IVC:  Appears normal.  Pancreas:  No focal abnormality seen.  Spleen:  Within normal limits in size and echotexture.  Right Kidney:   2.7 cm benign-appearing cyst superolaterally.  No hydronephrosis.  Normal echotexture.  Left Kidney:  Multiple cysts, the largest 3.1 cm superior laterally.  No hydronephrosis.  Normal echotexture.  Abdominal aorta:  No aneurysm identified.  IMPRESSION: Cholelithiasis.  No sonographic evidence of acute cholecystitis. No visible ductal stones.  The distal duct cannot be visualized.  Bilateral renal cysts.   Original Report Authenticated By: Charlett Nose, M.D.   Dg Chest Port 1 View  06/04/2013   *RADIOLOGY REPORT*  Clinical Data: Upper abdominal pain.  PORTABLE CHEST - 1 VIEW  Comparison: 01/05/2013  Findings: Mild hyperinflation of the lungs.  Heart is upper limits normal in size.  No confluent airspace opacities.  No effusions. No acute bony abnormality.  IMPRESSION: Mild hyperinflation.  No acute findings.   Original Report Authenticated By: Charlett Nose, M.D.    EKG: Independently reviewed. Normal sinus rhythm with RBBB.  Assessment/Plan Principal Problem:   Acute pancreatitis Active Problems:   HYPERTENSION   Gallstones   1. Acute pancreatitis most likely secondary to gallstone pancreatitis - patient will be kept n.p.o. IV hydration and pain relief medications. Consult GI for possible ERCP. Eventually will need surgical consult. Check lipid panel. 2. Hypertension - patient has been placed on when necessary IV hydralazine for systolic blood pressure more than 160.    Code Status: Full code.  Family Communication: Patient's wife at the bedside.  Disposition Plan: Admit to inpatient.    Jon Baker N. [facility] Pager [phone].  If 7PM-7AM, please contact night-coverage [email or web address] Password [facility] 06/04/2013, 5:51 AM

## 2013-06-04 NOTE — Progress Notes (Signed)
UR COMPLETED  

## 2013-06-04 NOTE — Progress Notes (Signed)
Patient seen and examined Admitted for acute pancreatitis Ultrasound shows gallstones Common bile duct normal caliber Liver function mildly elevated May need an ERCP and surgical consultation

## 2013-06-04 NOTE — ED Notes (Signed)
Presents with epigastric pain that began at 8 pm associated with nausea, vomiting, diaphoresis and lightheadedness. Nothing makes pain better, nothing makes pain worse. Pt is mildly hypotensive and brady

## 2013-06-04 NOTE — Consult Note (Signed)
Subjective:   HPI  The patient is a 79 year old male who was admitted to the hospital last night with complaints of upper abdominal pain. After presenting to the emergency room he was found to have an elevated lipase greater than 3000, and elevated liver enzymes. An abdominal ultrasound showed gallstones. The common bile duct was normal. He denies drinking alcohol. He feels better today. He has been having some right-sided abdominal pain intermittently but rather mild for quite some time which goes through to his back.  Review of Systems No chest pain or shortness of breath  Past Medical History  Diagnosis Date  . DJD (degenerative joint disease)   . Chest pain     2009, saw  cardiology , decline a  stress test  . HTN (hypertension)   . Claudication     2009, saw  cardiology, declined ABIs  . COPD (chronic obstructive pulmonary disease)     states he has been told by MD he has copd  . Shortness of breath   . Pancarditis 05/2013   Past Surgical History  Procedure Laterality Date  . Myleogram  1979  . Total knee arthroplasty Right 02-2011    Dr Despina Hick   History   Social History  . Marital Status: Married    Spouse Name: N/A    Number of Children: N/A  . Years of Education: N/A   Occupational History  . Not on file.   Social History Main Topics  . Smoking status: Former Smoker    Quit date: 12/10/1987  . Smokeless tobacco: Never Used  . Alcohol Use: No  . Drug Use: No  . Sexually Active: Not on file   Other Topics Concern  . Not on file   Social History Narrative  . No narrative on file   family history includes Coronary artery disease in his father and mother.  There is no history of Diabetes and Cancer. Current facility-administered medications:0.9 %  sodium chloride infusion, , Intravenous, Continuous, Eduard Clos, MD, Last Rate: 125 mL/hr at 06/04/13 0644;  acetaminophen (TYLENOL) suppository 650 mg, 650 mg, Rectal, Q6H PRN, Eduard Clos, MD;   acetaminophen (TYLENOL) tablet 650 mg, 650 mg, Oral, Q6H PRN, Eduard Clos, MD;  hydrALAZINE (APRESOLINE) injection 10 mg, 10 mg, Intravenous, Q4H PRN, Eduard Clos, MD HYDROmorphone (DILAUDID) injection 1 mg, 1 mg, Intravenous, Q4H PRN, Eduard Clos, MD, 1 mg at 06/04/13 0643;  ondansetron (ZOFRAN) injection 4 mg, 4 mg, Intravenous, Q6H PRN, Eduard Clos, MD, 4 mg at 06/04/13 0643;  ondansetron (ZOFRAN) tablet 4 mg, 4 mg, Oral, Q6H PRN, Eduard Clos, MD;  sodium chloride 0.9 % injection 3 mL, 3 mL, Intravenous, Q12H, Eduard Clos, MD No Known Allergies   Objective:     BP 143/86  Pulse 72  Temp(Src) 98.1 F (36.7 C) (Oral)  Resp 20  Ht 5' 10.5" (1.791 m)  Wt 88.451 kg (195 lb)  BMI 27.57 kg/m2  SpO2 90%  He is in no distress  Nonicteric  Heart regular rhythm no murmurs  Lungs clear  Abdomen: Bowel sounds are normal, soft, mild tenderness in epigastric area.  Laboratory No components found with this basename: d1      Assessment:     Gallstone pancreatitis      Plan:     Recommend supportive care for now with IV fluids and n.p.o. Recheck LFTs tomorrow and make further decisions clinically as to whether or not further imaging studies such as MRCP  or ERCP will be indicated prior to cholecystectomy, or whether we just proceed with laparoscopic cholecystectomy and intraoperative cholangiogram. Eventual surgical consult for cholecystectomy.  Lab Results  Component Value Date   HGB 15.7 06/04/2013   HGB 16.4 06/04/2013   HGB 15.8 01/04/2013   HCT 44.9 06/04/2013   HCT 45.1 06/04/2013   HCT 45.6 01/04/2013   ALKPHOS 118* 06/04/2013   ALKPHOS 121* 06/04/2013   ALKPHOS 79 01/30/2011   AST 272* 06/04/2013   AST 477* 06/04/2013   AST 25 01/30/2011   ALT 342* 06/04/2013   ALT 364* 06/04/2013   ALT 20 01/30/2011

## 2013-06-05 ENCOUNTER — Encounter (HOSPITAL_COMMUNITY): Payer: Self-pay | Admitting: General Surgery

## 2013-06-05 DIAGNOSIS — R112 Nausea with vomiting, unspecified: Secondary | ICD-10-CM

## 2013-06-05 DIAGNOSIS — R1013 Epigastric pain: Secondary | ICD-10-CM

## 2013-06-05 DIAGNOSIS — K8020 Calculus of gallbladder without cholecystitis without obstruction: Secondary | ICD-10-CM | POA: Diagnosis not present

## 2013-06-05 DIAGNOSIS — K859 Acute pancreatitis without necrosis or infection, unspecified: Secondary | ICD-10-CM | POA: Diagnosis not present

## 2013-06-05 LAB — COMPREHENSIVE METABOLIC PANEL
Albumin: 3.1 g/dL — ABNORMAL LOW (ref 3.5–5.2)
BUN: 18 mg/dL (ref 6–23)
Calcium: 7.9 mg/dL — ABNORMAL LOW (ref 8.4–10.5)
Creatinine, Ser: 0.99 mg/dL (ref 0.50–1.35)
GFR calc Af Amer: 88 mL/min — ABNORMAL LOW (ref >90)
Glucose, Bld: 92 mg/dL (ref 70–99)
Total Protein: 6 g/dL (ref 6.0–8.3)

## 2013-06-05 LAB — GLUCOSE, CAPILLARY: Glucose-Capillary: 84 mg/dL (ref 70–99)

## 2013-06-05 LAB — CBC
MCH: 31.8 pg (ref 26.0–34.0)
MCHC: 34.7 g/dL (ref 30.0–36.0)
MCV: 91.7 fL (ref 78.0–100.0)
Platelets: 233 10*3/uL (ref 150–400)
RDW: 13.2 % (ref 11.5–15.5)
WBC: 16.7 10*3/uL — ABNORMAL HIGH (ref 4.0–10.5)

## 2013-06-05 LAB — URINALYSIS, ROUTINE W REFLEX MICROSCOPIC
Hgb urine dipstick: NEGATIVE
Protein, ur: 30 mg/dL — AB
Specific Gravity, Urine: 1.029 (ref 1.005–1.030)
Urobilinogen, UA: 1 mg/dL (ref 0.0–1.0)

## 2013-06-05 LAB — SURGICAL PCR SCREEN: Staphylococcus aureus: NEGATIVE

## 2013-06-05 MED ORDER — SODIUM CHLORIDE 0.9 % IV SOLN
3.0000 g | Freq: Four times a day (QID) | INTRAVENOUS | Status: DC
  Administered 2013-06-05 – 2013-06-08 (×12): 3 g via INTRAVENOUS
  Filled 2013-06-05 (×16): qty 3

## 2013-06-05 MED ORDER — POTASSIUM CHLORIDE IN NACL 40-0.9 MEQ/L-% IV SOLN
INTRAVENOUS | Status: DC
  Administered 2013-06-05 – 2013-06-07 (×4): via INTRAVENOUS
  Filled 2013-06-05 (×10): qty 1000

## 2013-06-05 NOTE — Consult Note (Signed)
Jon Baker 1932/06/25  [id number].    Requesting MD: Dr. Lynford Citizen Chief Complaint/Reason for Consult: Gallstone pancreatitis HPI: 79 y/o male with PMH HTN presented to [facility] on 06/04/13 with post-prandial epigastric abdominal pain after eating [place] food the night before.  He had significant N/Varound 11pm the previous night.  Last BM was on Thursday where he had multiple loose BM's.  In the ED he complained of the abdominal pain, nausea, and dizziness.  He was worked up for chest pain and was thought to have a vagal episode in triage after vomiting.  C/o intermittent RUQ colicky abdominal pain with radiation to his back in the past, but was unrelated to food.  No urinary symptoms, no night sweats, fever/chills, SOB, left chest pain, headaches, muscle/joint aches, or URI symptoms.  No history of abdominal problems.  No past abdominal surgery or hernias.    ROS: All systems reviewed and otherwise negative except for as above   Family History  Problem Relation Age of Onset  . Coronary artery disease Mother   . Coronary artery disease Father     several MI'S  . Diabetes Neg Hx   . Cancer Neg Hx     Past Medical History  Diagnosis Date  . DJD (degenerative joint disease)   . Chest pain     2009, saw  cardiology , decline a  stress test  . HTN (hypertension)   . Claudication     2009, saw  cardiology, declined ABIs  . COPD (chronic obstructive pulmonary disease)     states he has been told by MD he has copd  . Shortness of breath   . Pancarditis 05/2013    Past Surgical History  Procedure Laterality Date  . Myleogram  1979  . Total knee arthroplasty Right 02-2011    Dr Despina Hick    Social History:  reports that he quit smoking about 25 years ago. Smoked 2-3ppd.  He has never used smokeless tobacco. He reports that he does not drink alcohol or use illicit drugs.  Allergies: No Known Allergies  Medications Prior to Admission  Medication Sig Dispense Refill  .  losartan-hydrochlorothiazide (HYZAAR) 50-12.5 MG per tablet Take 1 tablet by mouth daily.        Blood pressure 153/83, pulse 73, temperature 98.9 F (37.2 C), temperature source Axillary, resp. rate 18, height 5' 10.5" (1.791 m), weight 201 lb 4.5 oz (91.3 kg), SpO2 94.00%. Physical Exam: General: pleasant, WD/WN white male who is laying in bed in NAD HEENT: head is normocephalic, atraumatic.  Sclera are noninjected.  PERRL.  Ears and nose without any masses or lesions.  Mouth is pink and moist Heart: regular, rate, and rhythm.  No obvious murmurs, gallops, or rubs noted.  Palpable pedal pulses bilaterally Lungs: CTAB, no wheezes, rhonchi, or rales noted.  Respiratory effort nonlabored Abd: soft, mild tenderness in RUQ/epigastrium, ND, +BS, no masses, or organomegaly, easily reducible umbilical hernia MS: all 4 extremities are symmetrical with no cyanosis, clubbing, or edema. Skin: warm and dry with no masses, lesions, or rashes Psych: A&Ox3 with an appropriate affect.  Results for orders placed during the hospital encounter of 06/04/13 (from the past 48 hour(s))  CBC WITH DIFFERENTIAL     Status: Abnormal   Collection Time    06/04/13  2:13 AM      Result Value Range   WBC 14.6 (*) 4.0 - 10.5 K/uL   RBC 5.06  4.22 - 5.81 MIL/uL   Hemoglobin 16.4  13.0 - 17.0  g/dL   HCT 30.8  65.7 - 84.6 %   MCV 89.1  78.0 - 100.0 fL   MCH 32.4  26.0 - 34.0 pg   MCHC 36.4 (*) 30.0 - 36.0 g/dL   RDW 96.2  95.2 - 84.1 %   Platelets 265  150 - 400 K/uL   Neutrophils Relative % 80 (*) 43 - 77 %   Neutro Abs 11.7 (*) 1.7 - 7.7 K/uL   Lymphocytes Relative 13  12 - 46 %   Lymphs Abs 1.9  0.7 - 4.0 K/uL   Monocytes Relative 6  3 - 12 %   Monocytes Absolute 0.9  0.1 - 1.0 K/uL   Eosinophils Relative 1  0 - 5 %   Eosinophils Absolute 0.1  0.0 - 0.7 K/uL   Basophils Relative 0  0 - 1 %   Basophils Absolute 0.1  0.0 - 0.1 K/uL  COMPREHENSIVE METABOLIC PANEL     Status: Abnormal   Collection Time     06/04/13  2:13 AM      Result Value Range   Sodium 140  135 - 145 mEq/L   Potassium 3.3 (*) 3.5 - 5.1 mEq/L   Chloride 101  96 - 112 mEq/L   CO2 29  19 - 32 mEq/L   Glucose, Bld 186 (*) 70 - 99 mg/dL   BUN 21  6 - 23 mg/dL   Creatinine, Ser 3.24  0.50 - 1.35 mg/dL   Calcium 8.9  8.4 - 40.1 mg/dL   Total Protein 6.9  6.0 - 8.3 g/dL   Albumin 3.9  3.5 - 5.2 g/dL   AST 027 (*) 0 - 37 U/L   ALT 364 (*) 0 - 53 U/L   Alkaline Phosphatase 121 (*) 39 - 117 U/L   Total Bilirubin 1.7 (*) 0.3 - 1.2 mg/dL   GFR calc non Af Amer 67 (*) >90 mL/min   GFR calc Af Amer 78 (*) >90 mL/min   Comment:            The eGFR has been calculated     using the CKD EPI equation.     This calculation has not been     validated in all clinical     situations.     eGFR's persistently     <90 mL/min signify     possible Chronic Kidney Disease.  LIPASE, BLOOD     Status: Abnormal   Collection Time    06/04/13  2:13 AM      Result Value Range   Lipase >3000 (*) 11 - 59 U/L  PROTIME-INR     Status: None   Collection Time    06/04/13  2:13 AM      Result Value Range   Prothrombin Time 13.5  11.6 - 15.2 seconds   INR 1.05  0.00 - 1.49  POCT I-STAT TROPONIN I     Status: None   Collection Time    06/04/13  2:23 AM      Result Value Range   Troponin i, poc 0.00  0.00 - 0.08 ng/mL   Comment 3            Comment: Due to the release kinetics of cTnI,     a negative result within the first hours     of the onset of symptoms does not rule out     myocardial infarction with certainty.     If myocardial infarction is still suspected,  repeat the test at appropriate intervals.  CG4 I-STAT (LACTIC ACID)     Status: None   Collection Time    06/04/13  2:26 AM      Result Value Range   Lactic Acid, Venous 1.37  0.5 - 2.2 mmol/L  SAMPLE TO BLOOD BANK     Status: None   Collection Time    06/04/13  2:30 AM      Result Value Range   Blood Bank Specimen SAMPLE AVAILABLE FOR TESTING     Sample Expiration  06/05/2013    HEPATIC FUNCTION PANEL     Status: Abnormal   Collection Time    06/04/13  8:55 AM      Result Value Range   Total Protein 6.6  6.0 - 8.3 g/dL   Albumin 3.7  3.5 - 5.2 g/dL   AST 161 (*) 0 - 37 U/L   ALT 342 (*) 0 - 53 U/L   Alkaline Phosphatase 118 (*) 39 - 117 U/L   Total Bilirubin 1.1  0.3 - 1.2 mg/dL   Bilirubin, Direct 0.2  0.0 - 0.3 mg/dL   Indirect Bilirubin 0.9  0.3 - 0.9 mg/dL  BASIC METABOLIC PANEL     Status: Abnormal   Collection Time    06/04/13  8:55 AM      Result Value Range   Sodium 139  135 - 145 mEq/L   Potassium 3.6  3.5 - 5.1 mEq/L   Chloride 102  96 - 112 mEq/L   CO2 29  19 - 32 mEq/L   Glucose, Bld 126 (*) 70 - 99 mg/dL   BUN 20  6 - 23 mg/dL   Creatinine, Ser 0.96  0.50 - 1.35 mg/dL   Calcium 8.7  8.4 - 04.5 mg/dL   GFR calc non Af Amer 76 (*) >90 mL/min   GFR calc Af Amer 88 (*) >90 mL/min   Comment:            The eGFR has been calculated     using the CKD EPI equation.     This calculation has not been     validated in all clinical     situations.     eGFR's persistently     <90 mL/min signify     possible Chronic Kidney Disease.  CBC WITH DIFFERENTIAL     Status: Abnormal   Collection Time    06/04/13  8:55 AM      Result Value Range   WBC 15.4 (*) 4.0 - 10.5 K/uL   RBC 5.00  4.22 - 5.81 MIL/uL   Hemoglobin 15.7  13.0 - 17.0 g/dL   HCT 40.9  81.1 - 91.4 %   MCV 89.8  78.0 - 100.0 fL   MCH 31.4  26.0 - 34.0 pg   MCHC 35.0  30.0 - 36.0 g/dL   RDW 78.2  95.6 - 21.3 %   Platelets 255  150 - 400 K/uL   Neutrophils Relative % 88 (*) 43 - 77 %   Neutro Abs 13.5 (*) 1.7 - 7.7 K/uL   Lymphocytes Relative 7 (*) 12 - 46 %   Lymphs Abs 1.0  0.7 - 4.0 K/uL   Monocytes Relative 6  3 - 12 %   Monocytes Absolute 0.8  0.1 - 1.0 K/uL   Eosinophils Relative 0  0 - 5 %   Eosinophils Absolute 0.0  0.0 - 0.7 K/uL   Basophils Relative 0  0 - 1 %  Basophils Absolute 0.0  0.0 - 0.1 K/uL  LIPID PANEL     Status: Abnormal   Collection Time     06/04/13  8:55 AM      Result Value Range   Cholesterol 172  0 - 200 mg/dL   Triglycerides 54  <161 mg/dL   HDL 34 (*) >09 mg/dL   Total CHOL/HDL Ratio 5.1     VLDL 11  0 - 40 mg/dL   LDL Cholesterol 604 (*) 0 - 99 mg/dL   Comment:            Total Cholesterol/HDL:CHD Risk     Coronary Heart Disease Risk Table                         Men   Women      1/2 Average Risk   3.4   3.3      Average Risk       5.0   4.4      2 X Average Risk   9.6   7.1      3 X Average Risk  23.4   11.0                Use the calculated Patient Ratio     above and the CHD Risk Table     to determine the patient's CHD Risk.                ATP III CLASSIFICATION (LDL):      <100     mg/dL   Optimal      540-981  mg/dL   Near or Above                        Optimal      130-159  mg/dL   Borderline      191-478  mg/dL   High      >295     mg/dL   Very High  GLUCOSE, CAPILLARY     Status: None   Collection Time    06/04/13 12:50 PM      Result Value Range   Glucose-Capillary 91  70 - 99 mg/dL  GLUCOSE, CAPILLARY     Status: Abnormal   Collection Time    06/04/13  5:29 PM      Result Value Range   Glucose-Capillary 106 (*) 70 - 99 mg/dL  GLUCOSE, CAPILLARY     Status: None   Collection Time    06/05/13 12:41 AM      Result Value Range   Glucose-Capillary 90  70 - 99 mg/dL   Comment 1 Notify RN    CBC     Status: Abnormal   Collection Time    06/05/13  5:20 AM      Result Value Range   WBC 16.7 (*) 4.0 - 10.5 K/uL   RBC 4.69  4.22 - 5.81 MIL/uL   Hemoglobin 14.9  13.0 - 17.0 g/dL   HCT 62.1  30.8 - 65.7 %   MCV 91.7  78.0 - 100.0 fL   MCH 31.8  26.0 - 34.0 pg   MCHC 34.7  30.0 - 36.0 g/dL   RDW 84.6  96.2 - 95.2 %   Platelets 233  150 - 400 K/uL  LIPASE, BLOOD     Status: Abnormal   Collection Time    06/05/13  5:20 AM      Result Value  Range   Lipase 263 (*) 11 - 59 U/L  COMPREHENSIVE METABOLIC PANEL     Status: Abnormal   Collection Time    06/05/13  5:20 AM      Result Value  Range   Sodium 141  135 - 145 mEq/L   Potassium 3.1 (*) 3.5 - 5.1 mEq/L   Chloride 105  96 - 112 mEq/L   CO2 28  19 - 32 mEq/L   Glucose, Bld 92  70 - 99 mg/dL   BUN 18  6 - 23 mg/dL   Creatinine, Ser 1.61  0.50 - 1.35 mg/dL   Calcium 7.9 (*) 8.4 - 10.5 mg/dL   Total Protein 6.0  6.0 - 8.3 g/dL   Albumin 3.1 (*) 3.5 - 5.2 g/dL   AST 89 (*) 0 - 37 U/L   ALT 195 (*) 0 - 53 U/L   Alkaline Phosphatase 101  39 - 117 U/L   Total Bilirubin 1.2  0.3 - 1.2 mg/dL   GFR calc non Af Amer 76 (*) >90 mL/min   GFR calc Af Amer 88 (*) >90 mL/min   Comment:            The eGFR has been calculated     using the CKD EPI equation.     This calculation has not been     validated in all clinical     situations.     eGFR's persistently     <90 mL/min signify     possible Chronic Kidney Disease.  GLUCOSE, CAPILLARY     Status: None   Collection Time    06/05/13  6:24 AM      Result Value Range   Glucose-Capillary 91  70 - 99 mg/dL   Comment 1 Notify RN     Comment 2 Documented in Chart    GLUCOSE, CAPILLARY     Status: None   Collection Time    06/05/13 11:32 AM      Result Value Range   Glucose-Capillary 84  70 - 99 mg/dL   US Abdomen Complete  06/04/2013   *RADIOLOGY REPORT*  Clinical Data:  Elevated LFTs.  COMPLETE ABDOMINAL ULTRASOUND  Comparison:  None.  Findings:  Gallbladder:  Multiple gallstones within the gallbladder.  Sludge also noted.  Gallbladder wall upper normal in thickness at 2.4 mm. Negative sonographic Murphy's.  Common bile duct:   Normal caliber, 6 mm.  No visible ductal stones.  The distal duct is not visualized due to overlying bowel gas.  Liver:  No focal lesion identified.  Within normal limits in parenchymal echogenicity.  IVC:  Appears normal.  Pancreas:  No focal abnormality seen.  Spleen:  Within normal limits in size and echotexture.  Right Kidney:   2.7 cm benign-appearing cyst superolaterally.  No hydronephrosis.  Normal echotexture.  Left Kidney:  Multiple cysts, the  largest 3.1 cm superior laterally.  No hydronephrosis.  Normal echotexture.  Abdominal aorta:  No aneurysm identified.  IMPRESSION: Cholelithiasis.  No sonographic evidence of acute cholecystitis. No visible ductal stones.  The distal duct cannot be visualized.  Bilateral renal cysts.   Original Report Authenticated By: Charlett Nose, M.D.   Dg Chest Port 1 View  06/04/2013   *RADIOLOGY REPORT*  Clinical Data: Upper abdominal pain.  PORTABLE CHEST - 1 VIEW  Comparison: 01/05/2013  Findings: Mild hyperinflation of the lungs.  Heart is upper limits normal in size.  No confluent airspace opacities.  No effusions. No acute bony abnormality.  IMPRESSION: Mild hyperinflation.  No acute findings.   Original Report Authenticated By: Charlett Nose, M.D.     Assessment/Plan Gallstone Pancreatitis Epigastric abdominal pain Nausea/vomiting H/o HTN, COPD, Claudication  1.  Conservative treatment:  NPO after MN (had lunch today), IVF, pain control, antiemetics 2.  Plan for surgery tomorrow if labs are normal, appreciate GI's input regarding ERCP possibility 3.  Pre-op abx 4.  Hold anticoagulation after MN 5.  Ambulate and IS   DORT, Naveyah Iacovelli 06/05/2013, 1:14 PM Pager: [phone]

## 2013-06-05 NOTE — Progress Notes (Signed)
[facility] PROGRESS NOTE  Miking Usrey WUJ:[id number] DOB: January 08, 1932 DOA: 06/04/2013 PCP: Willow Ora, MD  Assessment/Plan: Principal Problem:   Acute pancreatitis Active Problems:   HYPERTENSION   Gallstones    1. Acute gallstone  pancreatitis most likely secondary to gallstone pancreatitis -advance to clear liquid , IV hydration and pain relief medications. Consulted GI for possible ERCP,surgical consult called . Eventually will need surgical consult. LFT's improving , empiric  2. Hypertension - patient has been placed on when necessary IV hydralazine for systolic blood pressure more than 160.      HPI/Subjective: IMPROVING PAIN , NAUSEA   Objective: Filed Vitals:   06/04/13 2111 06/04/13 2209 06/04/13 2215 06/05/13 0531  BP: 157/84   153/83  Pulse: 66   73  Temp: 100.3 F (37.9 C) 100.1 F (37.8 C)  98.9 F (37.2 C)  TempSrc: Oral Oral  Axillary  Resp: 20   18  Height:      Weight:    91.3 kg (201 lb 4.5 oz)  SpO2: 82%  92% 94%    Intake/Output Summary (Last 24 hours) at 06/05/13 1222 Last data filed at 06/04/13 2215  Gross per 24 hour  Intake    120 ml  Output      0 ml  Net    120 ml    Exam:  HENT:  Head: Atraumatic.  Nose: Nose normal.  Mouth/Throat: Oropharynx is clear and moist.  Eyes: Conjunctivae are normal. Pupils are equal, round, and reactive to light. No scleral icterus.  Neck: Neck supple. No tracheal deviation present.  Cardiovascular: Normal rate, regular rhythm, normal heart sounds and intact distal pulses.  Pulmonary/Chest: Effort normal and breath sounds normal. No respiratory distress.  Abdominal: Soft. Normal appearance and bowel sounds are normal. She exhibits no distension. There is no tenderness.  Musculoskeletal: She exhibits no edema and no tenderness.  Neurological: She is alert. No cranial nerve deficit.    Data Reviewed: Basic Metabolic Panel:  Recent Labs Lab 06/04/13 0213 06/04/13 0855 06/05/13 0520  NA 140  139 141  K 3.3* 3.6 3.1*  CL 101 102 105  CO2 29 29 28   GLUCOSE 186* 126* 92  BUN 21 20 18   CREATININE 1.03 0.99 0.99  CALCIUM 8.9 8.7 7.9*    Liver Function Tests:  Recent Labs Lab 06/04/13 0213 06/04/13 0855 06/05/13 0520  AST 477* 272* 89*  ALT 364* 342* 195*  ALKPHOS 121* 118* 101  BILITOT 1.7* 1.1 1.2  PROT 6.9 6.6 6.0  ALBUMIN 3.9 3.7 3.1*    Recent Labs Lab 06/04/13 0213 06/05/13 0520  LIPASE >3000* 263*   No results found for this basename: AMMONIA,  in the last 168 hours  CBC:  Recent Labs Lab 06/04/13 0213 06/04/13 0855 06/05/13 0520  WBC 14.6* 15.4* 16.7*  NEUTROABS 11.7* 13.5*  --   HGB 16.4 15.7 14.9  HCT 45.1 44.9 43.0  MCV 89.1 89.8 91.7  PLT 265 255 233    Cardiac Enzymes: No results found for this basename: CKTOTAL, CKMB, CKMBINDEX, TROPONINI,  in the last 168 hours BNP (last 3 results) No results found for this basename: PROBNP,  in the last 8760 hours   CBG:  Recent Labs Lab 06/04/13 1250 06/04/13 1729 06/05/13 0041 06/05/13 0624 06/05/13 1132  GLUCAP 91 106* 90 91 84    No results found for this or any previous visit (from the past 240 hour(s)).   Studies: US Abdomen Complete  06/04/2013   *RADIOLOGY REPORT*  Clinical Data:  Elevated LFTs.  COMPLETE ABDOMINAL ULTRASOUND  Comparison:  None.  Findings:  Gallbladder:  Multiple gallstones within the gallbladder.  Sludge also noted.  Gallbladder wall upper normal in thickness at 2.4 mm. Negative sonographic Murphy's.  Common bile duct:   Normal caliber, 6 mm.  No visible ductal stones.  The distal duct is not visualized due to overlying bowel gas.  Liver:  No focal lesion identified.  Within normal limits in parenchymal echogenicity.  IVC:  Appears normal.  Pancreas:  No focal abnormality seen.  Spleen:  Within normal limits in size and echotexture.  Right Kidney:   2.7 cm benign-appearing cyst superolaterally.  No hydronephrosis.  Normal echotexture.  Left Kidney:  Multiple cysts,  the largest 3.1 cm superior laterally.  No hydronephrosis.  Normal echotexture.  Abdominal aorta:  No aneurysm identified.  IMPRESSION: Cholelithiasis.  No sonographic evidence of acute cholecystitis. No visible ductal stones.  The distal duct cannot be visualized.  Bilateral renal cysts.   Original Report Authenticated By: Charlett Nose, M.D.   Dg Chest Port 1 View  06/04/2013   *RADIOLOGY REPORT*  Clinical Data: Upper abdominal pain.  PORTABLE CHEST - 1 VIEW  Comparison: 01/05/2013  Findings: Mild hyperinflation of the lungs.  Heart is upper limits normal in size.  No confluent airspace opacities.  No effusions. No acute bony abnormality.  IMPRESSION: Mild hyperinflation.  No acute findings.   Original Report Authenticated By: Charlett Nose, M.D.    Scheduled Meds: . ampicillin-sulbactam (UNASYN) IV  3 g Intravenous Q6H  . sodium chloride  3 mL Intravenous Q12H   Continuous Infusions: . 0.9 % NaCl with KCl 40 mEq / L      Principal Problem:   Acute pancreatitis Active Problems:   HYPERTENSION   Gallstones    Time spent: 40 minutes   [facility]  [facility] Pager [phone]. If 8PM-8AM, please contact night-coverage at [email or web address], password [facility] 06/05/2013, 12:22 PM  LOS: 1 day

## 2013-06-05 NOTE — Consult Note (Signed)
I have seen and examined the patient and agree with the assessment and plans. Suspect that we will be able to proceed with lap chole and IOC tomorrow  Jon Baker A. Magnus Ivan  MD, FACS

## 2013-06-05 NOTE — Progress Notes (Signed)
[facility] Progress Note  Subjective: Minimal pain wants to know when he can have something to drink  Objective: Vital signs in last 24 hours: Temp:  [98.1 F (36.7 C)-100.3 F (37.9 C)] 98.9 F (37.2 C) (06/28 0531) Pulse Rate:  [66-73] 73 (06/28 0531) Resp:  [18-20] 18 (06/28 0531) BP: (143-157)/(83-86) 153/83 mmHg (06/28 0531) SpO2:  [82 %-94 %] 94 % (06/28 0531) Weight:  [91.3 kg (201 lb 4.5 oz)] 91.3 kg (201 lb 4.5 oz) (06/28 0531) Weight change: 2.849 kg (6 lb 4.5 oz)   PE: Abdomen soft minimally tender  Lab Results: Results for orders placed during the hospital encounter of 06/04/13 (from the past 24 hour(s))  GLUCOSE, CAPILLARY     Status: None   Collection Time    06/04/13 12:50 PM      Result Value Range   Glucose-Capillary 91  70 - 99 mg/dL  GLUCOSE, CAPILLARY     Status: Abnormal   Collection Time    06/04/13  5:29 PM      Result Value Range   Glucose-Capillary 106 (*) 70 - 99 mg/dL  GLUCOSE, CAPILLARY     Status: None   Collection Time    06/05/13 12:41 AM      Result Value Range   Glucose-Capillary 90  70 - 99 mg/dL   Comment 1 Notify RN    CBC     Status: Abnormal   Collection Time    06/05/13  5:20 AM      Result Value Range   WBC 16.7 (*) 4.0 - 10.5 K/uL   RBC 4.69  4.22 - 5.81 MIL/uL   Hemoglobin 14.9  13.0 - 17.0 g/dL   HCT 16.1  09.6 - 04.5 %   MCV 91.7  78.0 - 100.0 fL   MCH 31.8  26.0 - 34.0 pg   MCHC 34.7  30.0 - 36.0 g/dL   RDW 40.9  81.1 - 91.4 %   Platelets 233  150 - 400 K/uL  LIPASE, BLOOD     Status: Abnormal   Collection Time    06/05/13  5:20 AM      Result Value Range   Lipase 263 (*) 11 - 59 U/L  COMPREHENSIVE METABOLIC PANEL     Status: Abnormal   Collection Time    06/05/13  5:20 AM      Result Value Range   Sodium 141  135 - 145 mEq/L   Potassium 3.1 (*) 3.5 - 5.1 mEq/L   Chloride 105  96 - 112 mEq/L   CO2 28  19 - 32 mEq/L   Glucose, Bld 92  70 - 99 mg/dL   BUN 18  6 - 23 mg/dL   Creatinine, Ser 7.82   0.50 - 1.35 mg/dL   Calcium 7.9 (*) 8.4 - 10.5 mg/dL   Total Protein 6.0  6.0 - 8.3 g/dL   Albumin 3.1 (*) 3.5 - 5.2 g/dL   AST 89 (*) 0 - 37 U/L   ALT 195 (*) 0 - 53 U/L   Alkaline Phosphatase 101  39 - 117 U/L   Total Bilirubin 1.2  0.3 - 1.2 mg/dL   GFR calc non Af Amer 76 (*) >90 mL/min   GFR calc Af Amer 88 (*) >90 mL/min  GLUCOSE, CAPILLARY     Status: None   Collection Time    06/05/13  6:24 AM      Result Value Range   Glucose-Capillary 91  70 - 99 mg/dL  Comment 1 Notify RN     Comment 2 Documented in Chart      Studies/Results: US Abdomen Complete  06/04/2013   *RADIOLOGY REPORT*  Clinical Data:  Elevated LFTs.  COMPLETE ABDOMINAL ULTRASOUND  Comparison:  None.  Findings:  Gallbladder:  Multiple gallstones within the gallbladder.  Sludge also noted.  Gallbladder wall upper normal in thickness at 2.4 mm. Negative sonographic Murphy's.  Common bile duct:   Normal caliber, 6 mm.  No visible ductal stones.  The distal duct is not visualized due to overlying bowel gas.  Liver:  No focal lesion identified.  Within normal limits in parenchymal echogenicity.  IVC:  Appears normal.  Pancreas:  No focal abnormality seen.  Spleen:  Within normal limits in size and echotexture.  Right Kidney:   2.7 cm benign-appearing cyst superolaterally.  No hydronephrosis.  Normal echotexture.  Left Kidney:  Multiple cysts, the largest 3.1 cm superior laterally.  No hydronephrosis.  Normal echotexture.  Abdominal aorta:  No aneurysm identified.  IMPRESSION: Cholelithiasis.  No sonographic evidence of acute cholecystitis. No visible ductal stones.  The distal duct cannot be visualized.  Bilateral renal cysts.   Original Report Authenticated By: Charlett Nose, M.D.   Dg Chest Port 1 View  06/04/2013   *RADIOLOGY REPORT*  Clinical Data: Upper abdominal pain.  PORTABLE CHEST - 1 VIEW  Comparison: 01/05/2013  Findings: Mild hyperinflation of the lungs.  Heart is upper limits normal in size.  No confluent airspace  opacities.  No effusions. No acute bony abnormality.  IMPRESSION: Mild hyperinflation.  No acute findings.   Original Report Authenticated By: Charlett Nose, M.D.      Assessment: Gallstone pancreatitis with falling LFTs and lipase, nondilated bile duct on ultrasound  Plan: Continue to monitor pain level and liver function tests and decide whether preoperative ERCP needed prior to eventual cholecystectomy. Recommend getting surgery consult if not called already.    Conard Alvira C 06/05/2013, 11:34 AM

## 2013-06-06 ENCOUNTER — Inpatient Hospital Stay (HOSPITAL_COMMUNITY): Payer: Medicare Other

## 2013-06-06 ENCOUNTER — Encounter (HOSPITAL_COMMUNITY)
Admission: EM | Disposition: A | Discharge status: Home / Self Care | Payer: Self-pay | Source: Home / Self Care | Attending: Internal Medicine

## 2013-06-06 ENCOUNTER — Encounter (HOSPITAL_COMMUNITY): Payer: Self-pay | Admitting: Anesthesiology

## 2013-06-06 ENCOUNTER — Inpatient Hospital Stay (HOSPITAL_COMMUNITY): Payer: Medicare Other | Admitting: Anesthesiology

## 2013-06-06 DIAGNOSIS — I10 Essential (primary) hypertension: Secondary | ICD-10-CM | POA: Diagnosis not present

## 2013-06-06 DIAGNOSIS — K429 Umbilical hernia without obstruction or gangrene: Secondary | ICD-10-CM | POA: Diagnosis not present

## 2013-06-06 DIAGNOSIS — K859 Acute pancreatitis without necrosis or infection, unspecified: Secondary | ICD-10-CM

## 2013-06-06 DIAGNOSIS — K8010 Calculus of gallbladder with chronic cholecystitis without obstruction: Secondary | ICD-10-CM | POA: Diagnosis not present

## 2013-06-06 DIAGNOSIS — K8020 Calculus of gallbladder without cholecystitis without obstruction: Secondary | ICD-10-CM

## 2013-06-06 DIAGNOSIS — J449 Chronic obstructive pulmonary disease, unspecified: Secondary | ICD-10-CM | POA: Diagnosis not present

## 2013-06-06 HISTORY — PX: CHOLECYSTECTOMY: SHX55

## 2013-06-06 LAB — CBC
HCT: 43 % (ref 39.0–52.0)
Hemoglobin: 14.7 g/dL (ref 13.0–17.0)
MCH: 31.3 pg (ref 26.0–34.0)
MCV: 91.7 fL (ref 78.0–100.0)
RBC: 4.69 MIL/uL (ref 4.22–5.81)

## 2013-06-06 LAB — GLUCOSE, CAPILLARY
Glucose-Capillary: 81 mg/dL (ref 70–99)
Glucose-Capillary: 82 mg/dL (ref 70–99)

## 2013-06-06 LAB — COMPREHENSIVE METABOLIC PANEL
ALT: 107 U/L — ABNORMAL HIGH (ref 0–53)
AST: 36 U/L (ref 0–37)
CO2: 27 mEq/L (ref 19–32)
Chloride: 103 mEq/L (ref 96–112)
Creatinine, Ser: 0.83 mg/dL (ref 0.50–1.35)
GFR calc non Af Amer: 82 mL/min — ABNORMAL LOW (ref >90)
Total Bilirubin: 1.1 mg/dL (ref 0.3–1.2)

## 2013-06-06 LAB — LIPASE, BLOOD: Lipase: 61 U/L — ABNORMAL HIGH (ref 11–59)

## 2013-06-06 SURGERY — LAPAROSCOPIC CHOLECYSTECTOMY WITH INTRAOPERATIVE CHOLANGIOGRAM
Anesthesia: General | Site: Abdomen | Wound class: Contaminated

## 2013-06-06 MED ORDER — ONDANSETRON HCL 4 MG/2ML IJ SOLN
INTRAMUSCULAR | Status: DC | PRN
  Administered 2013-06-06: 4 mg via INTRAVENOUS

## 2013-06-06 MED ORDER — EPHEDRINE SULFATE 50 MG/ML IJ SOLN
INTRAMUSCULAR | Status: DC | PRN
  Administered 2013-06-06: 10 mg via INTRAVENOUS

## 2013-06-06 MED ORDER — NEOSTIGMINE METHYLSULFATE 1 MG/ML IJ SOLN
INTRAMUSCULAR | Status: DC | PRN
  Administered 2013-06-06: 2 mg via INTRAVENOUS

## 2013-06-06 MED ORDER — FENTANYL CITRATE 0.05 MG/ML IJ SOLN
INTRAMUSCULAR | Status: DC | PRN
  Administered 2013-06-06 (×2): 75 ug via INTRAVENOUS

## 2013-06-06 MED ORDER — FENTANYL CITRATE 0.05 MG/ML IJ SOLN: 25.0000 ug | INTRAMUSCULAR | Status: DC | PRN

## 2013-06-06 MED ORDER — SODIUM CHLORIDE 0.9 % IR SOLN
Status: DC | PRN
  Administered 2013-06-06: 1300 mL

## 2013-06-06 MED ORDER — IOHEXOL 300 MG/ML  SOLN
INTRAMUSCULAR | Status: DC | PRN
  Administered 2013-06-06: 14:00:00

## 2013-06-06 MED ORDER — ACETAMINOPHEN 10 MG/ML IV SOLN
1000.0000 mg | Freq: Once | INTRAVENOUS | Status: AC | PRN
  Filled 2013-06-06: qty 100

## 2013-06-06 MED ORDER — SODIUM CHLORIDE 0.9 % IR SOLN
Status: DC | PRN
  Administered 2013-06-06: 1

## 2013-06-06 MED ORDER — SUCCINYLCHOLINE CHLORIDE 20 MG/ML IJ SOLN
INTRAMUSCULAR | Status: DC | PRN
  Administered 2013-06-06: 100 mg via INTRAVENOUS

## 2013-06-06 MED ORDER — PROPOFOL 10 MG/ML IV BOLUS
INTRAVENOUS | Status: DC | PRN
  Administered 2013-06-06: 40 mg via INTRAVENOUS
  Administered 2013-06-06: 100 mg via INTRAVENOUS
  Administered 2013-06-06: 20 mg via INTRAVENOUS

## 2013-06-06 MED ORDER — OXYCODONE HCL 5 MG PO TABS: 5.0000 mg | ORAL_TABLET | ORAL | Status: DC | PRN

## 2013-06-06 MED ORDER — GLYCOPYRROLATE 0.2 MG/ML IJ SOLN
INTRAMUSCULAR | Status: DC | PRN
  Administered 2013-06-06: 0.4 mg via INTRAVENOUS

## 2013-06-06 MED ORDER — LACTATED RINGERS IV SOLN
INTRAVENOUS | Status: DC | PRN
  Administered 2013-06-06 (×2): via INTRAVENOUS

## 2013-06-06 MED ORDER — ROCURONIUM BROMIDE 100 MG/10ML IV SOLN
INTRAVENOUS | Status: DC | PRN
  Administered 2013-06-06: 20 mg via INTRAVENOUS

## 2013-06-06 MED ORDER — BUPIVACAINE-EPINEPHRINE 0.25% -1:200000 IJ SOLN
INTRAMUSCULAR | Status: DC | PRN
  Administered 2013-06-06: 15 mL

## 2013-06-06 MED ORDER — DROPERIDOL 2.5 MG/ML IJ SOLN
0.6250 mg | INTRAMUSCULAR | Status: DC | PRN
  Filled 2013-06-06: qty 0.25

## 2013-06-06 MED ORDER — LIDOCAINE HCL (CARDIAC) 20 MG/ML IV SOLN
INTRAVENOUS | Status: DC | PRN
  Administered 2013-06-06: 40 mg via INTRAVENOUS

## 2013-06-06 SURGICAL SUPPLY — 49 items
ADH SKN CLS APL DERMABOND .7 (GAUZE/BANDAGES/DRESSINGS) ×1
APPLIER CLIP 5 13 M/L LIGAMAX5 (MISCELLANEOUS) ×2
APPLIER CLIP ROT 10 11.4 M/L (STAPLE)
APR CLP MED LRG 11.4X10 (STAPLE)
APR CLP MED LRG 5 ANG JAW (MISCELLANEOUS) ×1
BLADE SURG ROTATE 9660 (MISCELLANEOUS) ×2 IMPLANT
CANISTER SUCTION 2500CC (MISCELLANEOUS) ×2 IMPLANT
CHLORAPREP W/TINT 26ML (MISCELLANEOUS) ×2 IMPLANT
CLIP APPLIE 5 13 M/L LIGAMAX5 (MISCELLANEOUS) ×1 IMPLANT
CLIP APPLIE ROT 10 11.4 M/L (STAPLE) IMPLANT
CLOTH BEACON ORANGE TIMEOUT ST (SAFETY) ×2 IMPLANT
COVER MAYO STAND STRL (DRAPES) ×2 IMPLANT
COVER SURGICAL LIGHT HANDLE (MISCELLANEOUS) ×2 IMPLANT
DECANTER SPIKE VIAL GLASS SM (MISCELLANEOUS) ×4 IMPLANT
DERMABOND ADHESIVE PROPEN (GAUZE/BANDAGES/DRESSINGS) ×1
DERMABOND ADVANCED (GAUZE/BANDAGES/DRESSINGS) ×1
DERMABOND ADVANCED .7 DNX12 (GAUZE/BANDAGES/DRESSINGS) ×1 IMPLANT
DERMABOND ADVANCED .7 DNX6 (GAUZE/BANDAGES/DRESSINGS) ×1 IMPLANT
DRAPE C-ARM 42X72 X-RAY (DRAPES) ×2 IMPLANT
DRAPE UTILITY 15X26 W/TAPE STR (DRAPE) ×4 IMPLANT
ELECT REM PT RETURN 9FT ADLT (ELECTROSURGICAL) ×2
ELECTRODE REM PT RTRN 9FT ADLT (ELECTROSURGICAL) ×1 IMPLANT
FILTER SMOKE EVAC LAPAROSHD (FILTER) IMPLANT
FLUID NSS /IRRIG 1000 ML XXX (MISCELLANEOUS) ×4 IMPLANT
GLOVE BIO SURGEON STRL SZ8 (GLOVE) ×2 IMPLANT
GLOVE BIOGEL PI IND STRL 8 (GLOVE) ×1 IMPLANT
GLOVE BIOGEL PI INDICATOR 8 (GLOVE) ×1
GOWN STRL NON-REIN LRG LVL3 (GOWN DISPOSABLE) ×4 IMPLANT
GOWN STRL REIN XL XLG (GOWN DISPOSABLE) ×2 IMPLANT
KIT BASIN OR (CUSTOM PROCEDURE TRAY) ×2 IMPLANT
KIT ROOM TURNOVER OR (KITS) ×2 IMPLANT
NEEDLE 22X1 1/2 (OR ONLY) (NEEDLE) ×2 IMPLANT
NS IRRIG 1000ML POUR BTL (IV SOLUTION) ×2 IMPLANT
PAD ARMBOARD 7.5X6 YLW CONV (MISCELLANEOUS) ×2 IMPLANT
POUCH SPECIMEN RETRIEVAL 10MM (ENDOMECHANICALS) ×4 IMPLANT
SCISSORS LAP 5X35 DISP (ENDOMECHANICALS) IMPLANT
SET CHOLANGIOGRAPH 5 50 .035 (SET/KITS/TRAYS/PACK) ×2 IMPLANT
SET IRRIG TUBING LAPAROSCOPIC (IRRIGATION / IRRIGATOR) ×2 IMPLANT
SLEEVE ENDOPATH XCEL 5M (ENDOMECHANICALS) ×4 IMPLANT
SPECIMEN JAR SMALL (MISCELLANEOUS) ×2 IMPLANT
SUT VIC AB 2-0 SH 27 (SUTURE) ×2
SUT VIC AB 2-0 SH 27XBRD (SUTURE) ×1 IMPLANT
SUT VIC AB 4-0 PS2 27 (SUTURE) ×2 IMPLANT
TOWEL OR 17X24 6PK STRL BLUE (TOWEL DISPOSABLE) ×2 IMPLANT
TOWEL OR 17X26 10 PK STRL BLUE (TOWEL DISPOSABLE) ×2 IMPLANT
TRAY LAPAROSCOPIC (CUSTOM PROCEDURE TRAY) ×2 IMPLANT
TROCAR XCEL BLUNT TIP 100MML (ENDOMECHANICALS) ×2 IMPLANT
TROCAR XCEL NON-BLD 5MMX100MML (ENDOMECHANICALS) ×2 IMPLANT
WATER STERILE IRR 1000ML POUR (IV SOLUTION) IMPLANT

## 2013-06-06 NOTE — Transfer of Care (Signed)
Immediate Anesthesia Transfer of Care Note  Patient: Jon Baker  Procedure(s) Performed: Procedure(s): LAPAROSCOPIC CHOLECYSTECTOMY WITH INTRAOPERATIVE CHOLANGIOGRAM (N/A)  Patient Location: PACU  Anesthesia Type:General  Level of Consciousness: sedated  Airway & Oxygen Therapy: Patient Spontanous Breathing and Patient connected to face mask oxygen  Post-op Assessment: Report given to PACU RN and Post -op Vital signs reviewed and stable  Post vital signs: Reviewed and stable  Complications: No apparent anesthesia complications

## 2013-06-06 NOTE — Anesthesia Postprocedure Evaluation (Signed)
  Anesthesia Post-op Note  Patient: Jon Baker  Procedure(s) Performed: Procedure(s): LAPAROSCOPIC CHOLECYSTECTOMY WITH INTRAOPERATIVE CHOLANGIOGRAM (N/A)  Patient Location: PACU  Anesthesia Type:General  Level of Consciousness: awake, alert , oriented and patient cooperative  Airway and Oxygen Therapy: Patient Spontanous Breathing and Patient connected to nasal cannula oxygen  Post-op Pain: mild  Post-op Assessment: Post-op Vital signs reviewed, Patient's Cardiovascular Status Stable, Respiratory Function Stable, Patent Airway, No signs of Nausea or vomiting and Pain level controlled  Post-op Vital Signs: Reviewed and stable  Complications: No apparent anesthesia complications

## 2013-06-06 NOTE — Op Note (Signed)
06/04/2013 - 06/06/2013  2:41 PM  PATIENT:  Jon Baker  80 y.o. male  PRE-OPERATIVE DIAGNOSIS:  gallstone pancreatitis  POST-OPERATIVE DIAGNOSIS:  gallstone pancreatitis  PROCEDURE:  Procedure(s): LAPAROSCOPIC CHOLECYSTECTOMY WITH INTRAOPERATIVE CHOLANGIOGRAM REPAIR UMBILICAL HERNIA  SURGEON:  Surgeon(s): Liz Malady, MD  PHYSICIAN ASSISTANT:   ASSISTANTS: none   ANESTHESIA:   local and general  EBL:  Total I/O In: 1003 [I.V.:1003] Out: 10 [Blood:10]  BLOOD ADMINISTERED:none  DRAINS: none   SPECIMEN:  Excision  DISPOSITION OF SPECIMEN:  PATHOLOGY  COUNTS:  YES  DICTATION: Reubin Milan Dictation  Patient is hospitalized for biliary pancreatitis. His lipase has nearly normalized this morning. His pain is improved. He is brought for cholecystectomy with cholangiogram. He received intravenous antibiotics. Informed consent was obtained. He was brought to the operating room. General endotracheal anesthesia was administered by the anesthesia staff.His abdomen was prepped and draped in sterile fashion. We did time out procedure. Patient is noted to have a small umbilical hernia. Infraumbilical region was infiltrated with quarter percent Marcaine with epinephrine. An umbilical incision was made. Subcutaneous tissues were dissected down. The umbilicus was dissected off of the underlying tissue. There was a fascial defect about 1 cm in size. Hernia contents was reduced into the abdomen. 0 Vicryl pursestring suture was placed on the fascial opening. Hassan trocar was inserted. Abdomen was insufflated with carbon dioxide in standard fashion. Under direct vision, a 5 mm epigastric and 2 5 mm right abdomen port were placed. Local was used at each port site. Abdomen the gallbladder was retracted superior medially. The infundibulum was retracted inferolaterally. The gallbladder was inflamed acutely. Dissection began laterally and progressed medially at the infundibulum identifying the cystic  duct and the cystic artery. The cystic artery was posterior to the cystic duct. It was seen as a branch from a displaced right hepatic artery. It was dissected out, clipped twice proximally, once distally and divided. Cystic duct was further dissected until we had a critical view between the cystic duct the liver and the infundibulum. Clip was placed on the infundibular cystic duct junction. Small nick was made the cystic duct and cholangiogram catheter was inserted. Intraoperative cholangiogram demonstrated no common bile duct filling defects and good flow of contrast into the duodenum. Cholangiocatheter was removed. 3 clips were placed proximally on the cystic duct and it was divided. Gallbladder was taken off the liver bed with Bovie cautery. We did encounter one small posterior branch of the cystic artery which was clipped proximally and divided distally. Gallbladder was taken off the liver bed the rest of the way with Bovie cautery. It was placed in an Endo Catch bag. It was removed from the abdomen via the umbilical port site. Liver bed was copiously irrigated. Hemostasis was obtained with cautery. Irrigation returned clear. Liver bed was dry. Clips remain in excellent position. Ports were removed under direct vision. Pneumoperitoneum was released. Umbilical defect was closed with interrupted figure-of-eight 0 Vicryl sutures. The umbilical skin was tacked back down to the fascia with interrupted 2-0 Vicryl sutures. Skin there was closed with running 4-0 Monocryl followed by Dermabond. Other 3 port sites were irrigated and closed with subcuticular 4-0 Monocryl followed by Dermabond. All counts were correct. Patient tolerated procedure well without apparent complication was taken recovery in stable condition.  PATIENT DISPOSITION:  PACU - hemodynamically stable.   Delay start of Pharmacological VTE agent (>24hrs) due to surgical blood loss or risk of bleeding:  no  Violeta Gelinas, MD, MPH, FACS Pager:  [phone]  6/29/20142:41 PM

## 2013-06-06 NOTE — Anesthesia Procedure Notes (Signed)
Procedure Name: Intubation Date/Time: 06/06/2013 1:28 PM Performed by: Alanda Amass A Pre-anesthesia Checklist: Patient identified, Timeout performed, Emergency Drugs available, Suction available and Patient being monitored Patient Re-evaluated:Patient Re-evaluated prior to inductionOxygen Delivery Method: Circle system utilized Preoxygenation: Pre-oxygenation with 100% oxygen Intubation Type: IV induction, Rapid sequence and Cricoid Pressure applied Laryngoscope Size: Mac and 3 Grade View: Grade I Tube type: Oral Tube size: 7.5 mm Number of attempts: 1 Airway Equipment and Method: Stylet Placement Confirmation: ETT inserted through vocal cords under direct vision,  positive ETCO2 and breath sounds checked- equal and bilateral Secured at: 21 cm Tube secured with: Tape Dental Injury: Teeth and Oropharynx as per pre-operative assessment

## 2013-06-06 NOTE — Preoperative (Signed)
Beta Blockers   Reason not to administer Beta Blockers:Pt. not on home beta blocker 

## 2013-06-06 NOTE — Progress Notes (Signed)
Day of Surgery  Subjective: Pain resolved  Objective: Vital signs in last 24 hours: Temp:  [98.2 F (36.8 C)-99 F (37.2 C)] 99 F (37.2 C) (06/29 0555) Pulse Rate:  [66-72] 66 (06/29 0555) Resp:  [20] 20 (06/29 0555) BP: (146-165)/(81-94) 165/90 mmHg (06/29 0555) SpO2:  [95 %-96 %] 95 % (06/29 0555) Weight:  [93.305 kg (205 lb 11.2 oz)] 93.305 kg (205 lb 11.2 oz) (06/29 0555) Last BM Date: 06/05/13  Intake/Output from previous day: 06/28 0701 - 06/29 0700 In: 1800 [P.O.:600; I.V.:1000; IV Piggyback:200] Out: -  Intake/Output this shift:    General appearance: alert and cooperative Resp: clear to auscultation bilaterally Cardio: regular rate and rhythm GI: soft, no tenderness  Lab Results:   Recent Labs  06/05/13 0520 06/06/13 0600  WBC 16.7* 13.8*  HGB 14.9 14.7  HCT 43.0 43.0  PLT 233 209   BMET  Recent Labs  06/05/13 0520 06/06/13 0600  NA 141 137  K 3.1* 3.6  CL 105 103  CO2 28 27  GLUCOSE 92 86  BUN 18 12  CREATININE 0.99 0.83  CALCIUM 7.9* 7.9*   PT/INR  Recent Labs  06/04/13 0213  LABPROT 13.5  INR 1.05   ABG No results found for this basename: PHART, PCO2, PO2, HCO3,  in the last 72 hours  Studies/Results: No results found.  Anti-infectives: Anti-infectives   Start     Dose/Rate Route Frequency Ordered Stop   06/05/13 1300  Ampicillin-Sulbactam (UNASYN) 3 g in sodium chloride 0.9 % 100 mL IVPB     3 g 100 mL/hr over 60 Minutes Intravenous Every 6 hours 06/05/13 1207        Assessment/Plan: s/p Procedure(s): LAPAROSCOPIC CHOLECYSTECTOMY WITH INTRAOPERATIVE CHOLANGIOGRAM (N/A) Lipase near normal.  Will proceed with lap chole IOC.  I discussed the procedure in detail. We discussed the risks and benefits of a laparoscopic cholecystectomy and possible cholangiogram including, but not limited to bleeding, infection, injury to surrounding structures such as the intestine or liver, bile leak, retained gallstones, need to convert to an  open procedure, prolonged diarrhea, blood clots such as  DVT, common bile duct injury, anesthesia risks, and possible need for additional procedures.  The likelihood of improvement in symptoms and return to the patient's normal status is good. We discussed the typical post-operative recovery course.   LOS: 2 days    Giovannie Scerbo E 06/06/2013

## 2013-06-06 NOTE — Anesthesia Preprocedure Evaluation (Addendum)
Anesthesia Evaluation  Patient identified by MRN, date of birth, ID band Patient awake    Reviewed: Allergy & Precautions, H&P , NPO status , Patient's Chart, lab work & pertinent test results  History of Anesthesia Complications Negative for: history of anesthetic complications  Airway Mallampati: II TM Distance: >3 FB Neck ROM: Full    Dental  (+) Edentulous Upper and Edentulous Lower   Pulmonary shortness of breath, COPDformer smoker (quit '89),  breath sounds clear to auscultation  Pulmonary exam normal       Cardiovascular hypertension, Pt. on medications + DOE Rhythm:Regular Rate:Normal  2/14 ECHO: normal LVF, EF 55-60%, valves OK '12 stress myoview: normal perfusion, EF 67%   Neuro/Psych negative neurological ROS     GI/Hepatic Pancreatic cholecystitis Elevated LFTs N/V with present cholecystitis   Endo/Other  negative endocrine ROS  Renal/GU negative Renal ROS     Musculoskeletal   Abdominal (+) - obese,   Peds  Hematology   Anesthesia Other Findings   Reproductive/Obstetrics                         Anesthesia Physical Anesthesia Plan  ASA: III  Anesthesia Plan: General   Post-op Pain Management:    Induction: Intravenous  Airway Management Planned: Oral ETT  Additional Equipment:   Intra-op Plan:   Post-operative Plan: Extubation in OR  Informed Consent: I have reviewed the patients History and Physical, chart, labs and discussed the procedure including the risks, benefits and alternatives for the proposed anesthesia with the patient or authorized representative who has indicated his/her understanding and acceptance.     Plan Discussed with: CRNA and Surgeon  Anesthesia Plan Comments: (Plan routine monitors, GETA)        Anesthesia Quick Evaluation

## 2013-06-06 NOTE — Progress Notes (Signed)
[facility] PROGRESS NOTE  Jon Baker ZOX:[id number] DOB: 08/12/1932 DOA: 06/04/2013 PCP: Willow Ora, MD  Assessment/Plan: 1. Acute pancreatitis; patient day one S/PLAPAROSCOPIC CHOLECYSTECTOMY WITH INTRAOPERATIVE CHOLANGIOGRAM REPAIR UMBILICAL HERNIA, doing well will discharge upon surgery recommendation 2. HTN; restart Hyzaar 50-12.5 mg 1 tab by mouth daily when surgery in anticipation diet for now labetalol IV   Disposition Plan: Per surgery  Consultants:  General surgery  Procedures: LAPAROSCOPIC CHOLECYSTECTOMY WITH INTRAOPERATIVE CHOLANGIOGRAM  REPAIR UMBILICAL HERNIA    Antibiotics: Ampicillin-Sulbactam (UNASYN) 3 g in sodium chloride 0.9 % 100 mL IVPB dy 2     HPI/Subjective: HPI: Jon Baker is a 79 y.o. male chest history of hypertension presented to the ER because of abdominal pain. Patient's pain is mostly epigastric area. Pain started off last night suddenly severe with nausea vomiting. Early in the day patient had multiple episodes of diarrhea. Since the pain was persistent patient came to the ER. In the ER labs show elevated LFTs and lipase. Sonogram shows gallstones. At this time patient has been admitted for possible gallstone pancreatitis. Patient denies any fever chills. Denies using any recent antibiotics or any new medications. Presently patient is not short of breath and hemodynamically stable. S/P dy 1 for LAPAROSCOPIC CHOLECYSTECTOMY WITH INTRAOPERATIVE CHOLANGIOGRAM REPAIR UMBILICAL HERNIA . TODAY patient states doing well, mild abdominal discomfort consistent with surgery, patient been ambulating today    Objective: Filed Vitals:   06/06/13 1515 06/06/13 1530 06/06/13 1545 06/06/13 1620  BP: 159/85 156/89 144/82 136/83  Pulse: 81 79 75 73  Temp:  98.4 F (36.9 C)  98.6 F (37 C)  TempSrc:    Oral  Resp: 23 23 16 20   Height:      Weight:      SpO2: 100% 100% 99% 94%    Intake/Output Summary (Last 24 hours) at 06/06/13 2023 Last  data filed at 06/06/13 1445  Gross per 24 hour  Intake   2503 ml  Output     10 ml  Net   2493 ml   Filed Weights   06/04/13 0220 06/05/13 0531 06/06/13 0555  Weight: 88.451 kg (195 lb) 91.3 kg (201 lb 4.5 oz) 93.305 kg (205 lb 11.2 oz)    Exam:   General:  Alert, NAD  Cardiovascular: Regular rhythm and rate, negative murmurs rubs gallops, PT/DP pulse 2+  Respiratory: Clear to auscultation bilateral  Abdomen: Appropriately tender, all incision sites negative sign of infection negative discharge    Data Reviewed: Basic Metabolic Panel:  Recent Labs Lab 06/04/13 0213 06/04/13 0855 06/05/13 0520 06/06/13 0600  NA 140 139 141 137  K 3.3* 3.6 3.1* 3.6  CL 101 102 105 103  CO2 29 29 28 27   GLUCOSE 186* 126* 92 86  BUN 21 20 18 12   CREATININE 1.03 0.99 0.99 0.83  CALCIUM 8.9 8.7 7.9* 7.9*   Liver Function Tests:  Recent Labs Lab 06/04/13 0213 06/04/13 0855 06/05/13 0520 06/06/13 0600  AST 477* 272* 89* 36  ALT 364* 342* 195* 107*  ALKPHOS 121* 118* 101 93  BILITOT 1.7* 1.1 1.2 1.1  PROT 6.9 6.6 6.0 5.8*  ALBUMIN 3.9 3.7 3.1* 2.9*    Recent Labs Lab 06/04/13 0213 06/05/13 0520 06/06/13 0600  LIPASE >3000* 263* 61*   No results found for this basename: AMMONIA,  in the last 168 hours CBC:  Recent Labs Lab 06/04/13 0213 06/04/13 0855 06/05/13 0520 06/06/13 0600  WBC 14.6* 15.4* 16.7* 13.8*  NEUTROABS 11.7* 13.5*  --   --  HGB 16.4 15.7 14.9 14.7  HCT 45.1 44.9 43.0 43.0  MCV 89.1 89.8 91.7 91.7  PLT 265 255 233 209   Cardiac Enzymes: No results found for this basename: CKTOTAL, CKMB, CKMBINDEX, TROPONINI,  in the last 168 hours BNP (last 3 results) No results found for this basename: PROBNP,  in the last 8760 hours CBG:  Recent Labs Lab 06/05/13 1132 06/05/13 1819 06/05/13 2346 06/06/13 0637 06/06/13 1742  GLUCAP 84 91 89 81 82    Recent Results (from the past 240 hour(s))  SURGICAL PCR SCREEN     Status: None   Collection Time     06/05/13  3:30 PM      Result Value Range Status   MRSA, PCR NEGATIVE  NEGATIVE Final   Staphylococcus aureus NEGATIVE  NEGATIVE Final   Comment:            The Xpert SA Assay (FDA     approved for NASAL specimens     in patients over 35 years of age),     is one component of     a comprehensive surveillance     program.  Test performance has     been validated by [employer] for patients greater     than or equal to 79 year old.     It is not intended     to diagnose infection nor to     guide or monitor treatment.     Studies: Dg Cholangiogram Operative  06/06/2013   *RADIOLOGY REPORT*  Clinical Data: Gallstone pancreatitis.  Laparoscopic cholecystectomy.  INTRAOPERATIVE CHOLANGIOGRAM  Technique:  Multiple fluoroscopic spot radiographs were obtained during intraoperative cholangiogram and are submitted for interpretation post-operatively.  Comparison: Abdominal ultrasound 06/04/2013.  Findings: Technique:  C-arm fluoroscopic images were obtained intraoperatively and submitted for postoperative interpretation. Please see the performing provider's procedural report for the fluoroscopy time utilized.  On the initial submitted images, there is some leakage of the contrast from the injection site, extending medially and laterally. This does not appear to increase in volume during the injection. There is no evidence of retained calculus or ductal obstruction. There is drainage into the duodenum.  IMPRESSION:  1.  No evidence of retained calculus or ductal obstruction. 2.  Apparent leakage at the injection site.  This does not appear to increase in volume during injection to suggest a bile leak - correlate clinically.   Original Report Authenticated By: Carey Bullocks, M.D.    Scheduled Meds: . ampicillin-sulbactam (UNASYN) IV  3 g Intravenous Q6H  . sodium chloride  3 mL Intravenous Q12H   Continuous Infusions: . 0.9 % NaCl with KCl 40 mEq / L 125 mL/hr at 06/06/13 1024     Principal Problem:   Acute pancreatitis Active Problems:   HYPERTENSION   Gallstones    Time spent: 40 minutes   Oneal Biglow, J  [facility] Pager [phone]. If 7PM-7AM, please contact night-coverage at [email or web address], password [facility] 06/06/2013, 8:23 PM  LOS: 2 days

## 2013-06-07 DIAGNOSIS — K859 Acute pancreatitis without necrosis or infection, unspecified: Secondary | ICD-10-CM | POA: Diagnosis not present

## 2013-06-07 DIAGNOSIS — K8010 Calculus of gallbladder with chronic cholecystitis without obstruction: Secondary | ICD-10-CM | POA: Diagnosis not present

## 2013-06-07 DIAGNOSIS — K8020 Calculus of gallbladder without cholecystitis without obstruction: Secondary | ICD-10-CM | POA: Diagnosis not present

## 2013-06-07 LAB — GLUCOSE, CAPILLARY
Glucose-Capillary: 115 mg/dL — ABNORMAL HIGH (ref 70–99)
Glucose-Capillary: 96 mg/dL (ref 70–99)

## 2013-06-07 MED ORDER — LOSARTAN POTASSIUM 50 MG PO TABS
100.0000 mg | ORAL_TABLET | Freq: Every day | ORAL | Status: DC
  Filled 2013-06-07: qty 2

## 2013-06-07 MED ORDER — OXYCODONE-ACETAMINOPHEN 5-325 MG PO TABS: 1.0000 | ORAL_TABLET | Freq: Four times a day (QID) | ORAL | Status: DC | PRN

## 2013-06-07 MED ORDER — HYDROCHLOROTHIAZIDE 12.5 MG PO CAPS
12.5000 mg | ORAL_CAPSULE | Freq: Every day | ORAL | Status: DC
  Administered 2013-06-07 – 2013-06-08 (×2): 12.5 mg via ORAL
  Filled 2013-06-07 (×2): qty 1

## 2013-06-07 MED ORDER — HYDROCHLOROTHIAZIDE 25 MG PO TABS
25.0000 mg | ORAL_TABLET | Freq: Every day | ORAL | Status: DC
  Filled 2013-06-07: qty 1

## 2013-06-07 MED ORDER — LABETALOL HCL 5 MG/ML IV SOLN
5.0000 mg | Freq: Two times a day (BID) | INTRAVENOUS | Status: DC
  Administered 2013-06-07: 5 mg via INTRAVENOUS
  Filled 2013-06-07 (×3): qty 4

## 2013-06-07 MED ORDER — LOSARTAN POTASSIUM 50 MG PO TABS
50.0000 mg | ORAL_TABLET | Freq: Every day | ORAL | Status: DC
  Administered 2013-06-07 – 2013-06-08 (×2): 50 mg via ORAL
  Filled 2013-06-07 (×2): qty 1

## 2013-06-07 NOTE — Progress Notes (Signed)
OK for discharge from surgical standpoint. Follow-up arranged.  Jon Baker. Corliss Skains, MD, [facility] Surgery  General/ Trauma Surgery  06/07/2013 11:13 AM

## 2013-06-07 NOTE — Progress Notes (Signed)
Patient ID: Jon Baker, male   DOB: [birth year], 79 y.o.   MRN: [id number] Jon Baker [id number] 1932/12/04  CARE TEAM:  PCP: Willow Ora, MD  Outpatient Care Team: Patient Care Team: Wanda Plump, MD as PCP - General (Internal Medicine)  Inpatient Treatment Team: Treatment Team: Attending Provider: Richarda Overlie, MD; Rounding Team: Loel Ro, MD; Registered Nurse: Barrington Ellison, RN; Consulting Physician: Graylin Shiver, MD; Registered Nurse: Druscilla Brownie, RN; Attending Physician: Barrie Folk, MD; Technician: Dewitt Rota, NT; Technician: Sela Hilding, NT; Consulting Physician: Bishop Limbo, MD; Technician: Maricela Curet, NT; Registered Nurse: Martina Sinner, RN; Registered Nurse: Jerene Dilling, RN   Subjective: Pt feeling good, ready to go home.  He ambulated in the hallways yesterday.  Voiding without any difficulties, +flatus, no bm yet.  Tolerated full liquid diet, on heart healthy now.  Objective:  Vital signs:  Filed Vitals:   06/06/13 1620 06/06/13 2113 06/06/13 2154 06/07/13 0512  BP: 136/83 179/102 157/87 160/88  Pulse: 73 75 79   Temp: 98.6 F (37 C) 99.4 F (37.4 C)  98.2 F (36.8 C)  TempSrc: Oral Oral  Oral  Resp: 20 20  20   Height:      Weight:    209 lb 1.6 oz (94.847 kg)  SpO2: 94% 95%  93%    Last BM Date: 06/05/13  Intake/Output   Yesterday:  06/29 0701 - 06/30 0700 In: 1543 [P.O.:240; I.V.:1303] Out: 610 [Urine:600; Blood:10] This shift:     Bowel function:  Flatus: +  BM: none  Drain: n/a  Physical Exam:  General: Pt awake/alert/oriented x4 in no acute distress Psych:  No delerium/psychosis/paranoia HENT: Normocephalic, Mucus membranes moist.  No thrush Chest: CTA No chest wall pain w good excursion CV:  Pulses intact.  Regular rhythm MS: Normal AROM mjr joints.  No obvious deformity Abdomen: Soft.  Nondistended.  Incision sites are clean dry and intact.  Mildly tender at incisions only.  No evidence of peritonitis.   No incarcerated hernias. Ext:  SCDs BLE.  No mjr edema.  No cyanosis Skin: No petechiae / purpura   Problem List:   Principal Problem:   Acute pancreatitis Active Problems:   HYPERTENSION   Gallstones    Results:   Labs: Results for orders placed during the hospital encounter of 06/04/13 (from the past 48 hour(s))  GLUCOSE, CAPILLARY     Status: None   Collection Time    06/05/13 11:32 AM      Result Value Range   Glucose-Capillary 84  70 - 99 mg/dL  SURGICAL PCR SCREEN     Status: None   Collection Time    06/05/13  3:30 PM      Result Value Range   MRSA, PCR NEGATIVE  NEGATIVE   Staphylococcus aureus NEGATIVE  NEGATIVE   Comment:            The Xpert SA Assay (FDA     approved for NASAL specimens     in patients over 24 years of age),     is one component of     a comprehensive surveillance     program.  Test performance has     been validated by [employer] for patients greater     than or equal to 23 year old.     It is not intended     to diagnose infection nor to     guide or monitor  treatment.  URINALYSIS, ROUTINE W REFLEX MICROSCOPIC     Status: Abnormal   Collection Time    06/05/13  4:24 PM      Result Value Range   Color, Urine AMBER (*) YELLOW   Comment: BIOCHEMICALS MAY BE AFFECTED BY COLOR   APPearance CLEAR  CLEAR   Specific Gravity, Urine 1.029  1.005 - 1.030   pH 6.0  5.0 - 8.0   Glucose, UA NEGATIVE  NEGATIVE mg/dL   Hgb urine dipstick NEGATIVE  NEGATIVE   Bilirubin Urine SMALL (*) NEGATIVE   Ketones, ur NEGATIVE  NEGATIVE mg/dL   Protein, ur 30 (*) NEGATIVE mg/dL   Urobilinogen, UA 1.0  0.0 - 1.0 mg/dL   Nitrite NEGATIVE  NEGATIVE   Leukocytes, UA NEGATIVE  NEGATIVE  URINE MICROSCOPIC-ADD ON     Status: None   Collection Time    06/05/13  4:24 PM      Result Value Range   WBC, UA 0-2  <3 WBC/hpf   Urine-Other MUCOUS PRESENT    GLUCOSE, CAPILLARY     Status: None   Collection Time    06/05/13  6:19 PM      Result Value Range    Glucose-Capillary 91  70 - 99 mg/dL  GLUCOSE, CAPILLARY     Status: None   Collection Time    06/05/13 11:46 PM      Result Value Range   Glucose-Capillary 89  70 - 99 mg/dL   Comment 1 Notify RN    COMPREHENSIVE METABOLIC PANEL     Status: Abnormal   Collection Time    06/06/13  6:00 AM      Result Value Range   Sodium 137  135 - 145 mEq/L   Potassium 3.6  3.5 - 5.1 mEq/L   Chloride 103  96 - 112 mEq/L   CO2 27  19 - 32 mEq/L   Glucose, Bld 86  70 - 99 mg/dL   BUN 12  6 - 23 mg/dL   Creatinine, Ser 2.13  0.50 - 1.35 mg/dL   Calcium 7.9 (*) 8.4 - 10.5 mg/dL   Total Protein 5.8 (*) 6.0 - 8.3 g/dL   Albumin 2.9 (*) 3.5 - 5.2 g/dL   AST 36  0 - 37 U/L   ALT 107 (*) 0 - 53 U/L   Alkaline Phosphatase 93  39 - 117 U/L   Total Bilirubin 1.1  0.3 - 1.2 mg/dL   GFR calc non Af Amer 82 (*) >90 mL/min   GFR calc Af Amer >90  >90 mL/min   Comment:            The eGFR has been calculated     using the CKD EPI equation.     This calculation has not been     validated in all clinical     situations.     eGFR's persistently     <90 mL/min signify     possible Chronic Kidney Disease.  CBC     Status: Abnormal   Collection Time    06/06/13  6:00 AM      Result Value Range   WBC 13.8 (*) 4.0 - 10.5 K/uL   RBC 4.69  4.22 - 5.81 MIL/uL   Hemoglobin 14.7  13.0 - 17.0 g/dL   HCT 08.6  57.8 - 46.9 %   MCV 91.7  78.0 - 100.0 fL   MCH 31.3  26.0 - 34.0 pg   MCHC 34.2  30.0 -  36.0 g/dL   RDW 16.1  09.6 - 04.5 %   Platelets 209  150 - 400 K/uL  LIPASE, BLOOD     Status: Abnormal   Collection Time    06/06/13  6:00 AM      Result Value Range   Lipase 61 (*) 11 - 59 U/L  GLUCOSE, CAPILLARY     Status: None   Collection Time    06/06/13  6:37 AM      Result Value Range   Glucose-Capillary 81  70 - 99 mg/dL   Comment 1 Notify RN    GLUCOSE, CAPILLARY     Status: None   Collection Time    06/06/13  5:42 PM      Result Value Range   Glucose-Capillary 82  70 - 99 mg/dL  GLUCOSE,  CAPILLARY     Status: None   Collection Time    06/07/13 12:21 AM      Result Value Range   Glucose-Capillary 96  70 - 99 mg/dL   Comment 1 Notify RN    GLUCOSE, CAPILLARY     Status: Abnormal   Collection Time    06/07/13  6:31 AM      Result Value Range   Glucose-Capillary 110 (*) 70 - 99 mg/dL    Imaging / Studies: Dg Cholangiogram Operative  06/06/2013   *RADIOLOGY REPORT*  Clinical Data: Gallstone pancreatitis.  Laparoscopic cholecystectomy.  INTRAOPERATIVE CHOLANGIOGRAM  Technique:  Multiple fluoroscopic spot radiographs were obtained during intraoperative cholangiogram and are submitted for interpretation post-operatively.  Comparison: Abdominal ultrasound 06/04/2013.  Findings: Technique:  C-arm fluoroscopic images were obtained intraoperatively and submitted for postoperative interpretation. Please see the performing provider's procedural report for the fluoroscopy time utilized.  On the initial submitted images, there is some leakage of the contrast from the injection site, extending medially and laterally. This does not appear to increase in volume during the injection. There is no evidence of retained calculus or ductal obstruction. There is drainage into the duodenum.  IMPRESSION:  1.  No evidence of retained calculus or ductal obstruction. 2.  Apparent leakage at the injection site.  This does not appear to increase in volume during injection to suggest a bile leak - correlate clinically.   Original Report Authenticated By: Carey Bullocks, M.D.    Medications / Allergies: per chart  Antibiotics: Anti-infectives   Start     Dose/Rate Route Frequency Ordered Stop   06/05/13 1300  Ampicillin-Sulbactam (UNASYN) 3 g in sodium chloride 0.9 % 100 mL IVPB     3 g 100 mL/hr over 60 Minutes Intravenous Every 6 hours 06/05/13 1207        Assessment/Plan Jon Baker  79 y.o. male  1 Day Post-Op  Procedure(s): LAPAROSCOPIC CHOLECYSTECTOMY WITH INTRAOPERATIVE  CHOLANGIOGRAM -Stable for discharge from surgical perspective -he does not need further antibiotics -follow up in 3 weeks, an appointment has been made for the patient.   Ashok Norris, [facility] Surgery Pager [phone] Office [phone]  06/07/2013 8:21 AM

## 2013-06-07 NOTE — Progress Notes (Signed)
[facility] PROGRESS NOTE  Jon Baker [id number]:[id number] DOB: 07/21/1932 DOA: 06/04/2013 PCP: Willow Ora, MD  Assessment/Plan: 1. Acute pancreatitis; patient day one S/PLAPAROSCOPIC CHOLECYSTECTOMY WITH INTRAOPERATIVE CHOLANGIOGRAM REPAIR UMBILICAL HERNIA, doing well will discharge upon surgery recommendation 2. HTN; restart Hyzaar 100-25 mg 1 tab by mouth daily in anticipation of patient's discharge. Discontinue labetalol If BP control will discharge in the Am   Disposition Plan: Per surgery  Consultants:  General surgery  Procedures: LAPAROSCOPIC CHOLECYSTECTOMY WITH INTRAOPERATIVE CHOLANGIOGRAM  REPAIR UMBILICAL HERNIA    Antibiotics: Ampicillin-Sulbactam (UNASYN) 3 g in sodium chloride 0.9 % 100 mL IVPB dy 2     HPI/Subjective: HPI: Jon Baker is a 79 y.o. male chest history of hypertension presented to the ER because of abdominal pain. Patient's pain is mostly epigastric area. Pain started off last night suddenly severe with nausea vomiting. Early in the day patient had multiple episodes of diarrhea. Since the pain was persistent patient came to the ER. In the ER labs show elevated LFTs and lipase. Sonogram shows gallstones. At this time patient has been admitted for possible gallstone pancreatitis. Patient denies any fever chills. Denies using any recent antibiotics or any new medications. Presently patient is not short of breath and hemodynamically stable. S/P dy 1 for LAPAROSCOPIC CHOLECYSTECTOMY WITH INTRAOPERATIVE CHOLANGIOGRAM REPAIR UMBILICAL HERNIA . TODAY patient states doing well, mild abdominal discomfort consistent with surgery, patient been ambulating today    Objective: Filed Vitals:   06/06/13 2113 06/06/13 2154 06/07/13 0512 06/07/13 1402  BP: 179/102 157/87 160/88 156/84  Pulse: 75 79  75  Temp: 99.4 F (37.4 C)  98.2 F (36.8 C) 98.7 F (37.1 C)  TempSrc: Oral  Oral Oral  Resp: 20  20 20   Height:      Weight:   94.847 kg (209 lb 1.6 oz)    SpO2: 95%  93% 95%    Intake/Output Summary (Last 24 hours) at 06/07/13 1458 Last data filed at 06/07/13 1300  Gross per 24 hour  Intake    600 ml  Output    600 ml  Net      0 ml   Filed Weights   06/05/13 0531 06/06/13 0555 06/07/13 0512  Weight: 91.3 kg (201 lb 4.5 oz) 93.305 kg (205 lb 11.2 oz) 94.847 kg (209 lb 1.6 oz)    Exam:   General:  Alert, NAD  Cardiovascular: Regular rhythm and rate, negative murmurs rubs gallops, PT/DP pulse 2+  Respiratory: Clear to auscultation bilateral  Abdomen: Appropriately tender, all incision sites negative sign of infection negative discharge    Data Reviewed: Basic Metabolic Panel:  Recent Labs Lab 06/04/13 0213 06/04/13 0855 06/05/13 0520 06/06/13 0600  NA 140 139 141 137  K 3.3* 3.6 3.1* 3.6  CL 101 102 105 103  CO2 29 29 28 27   GLUCOSE 186* 126* 92 86  BUN 21 20 18 12   CREATININE 1.03 0.99 0.99 0.83  CALCIUM 8.9 8.7 7.9* 7.9*   Liver Function Tests:  Recent Labs Lab 06/04/13 0213 06/04/13 0855 06/05/13 0520 06/06/13 0600  AST 477* 272* 89* 36  ALT 364* 342* 195* 107*  ALKPHOS 121* 118* 101 93  BILITOT 1.7* 1.1 1.2 1.1  PROT 6.9 6.6 6.0 5.8*  ALBUMIN 3.9 3.7 3.1* 2.9*    Recent Labs Lab 06/04/13 0213 06/05/13 0520 06/06/13 0600  LIPASE >3000* 263* 61*   No results found for this basename: AMMONIA,  in the last 168 hours CBC:  Recent Labs Lab 06/04/13 0213 06/04/13  1610 06/05/13 0520 06/06/13 0600  WBC 14.6* 15.4* 16.7* 13.8*  NEUTROABS 11.7* 13.5*  --   --   HGB 16.4 15.7 14.9 14.7  HCT 45.1 44.9 43.0 43.0  MCV 89.1 89.8 91.7 91.7  PLT 265 255 233 209   Cardiac Enzymes: No results found for this basename: CKTOTAL, CKMB, CKMBINDEX, TROPONINI,  in the last 168 hours BNP (last 3 results) No results found for this basename: PROBNP,  in the last 8760 hours CBG:  Recent Labs Lab 06/06/13 0637 06/06/13 1742 06/07/13 0021 06/07/13 0631 06/07/13 1135  GLUCAP 81 82 96 110* 115*     Recent Results (from the past 240 hour(s))  SURGICAL PCR SCREEN     Status: None   Collection Time    06/05/13  3:30 PM      Result Value Range Status   MRSA, PCR NEGATIVE  NEGATIVE Final   Staphylococcus aureus NEGATIVE  NEGATIVE Final   Comment:            The Xpert SA Assay (FDA     approved for NASAL specimens     in patients over 24 years of age),     is one component of     a comprehensive surveillance     program.  Test performance has     been validated by [employer] for patients greater     than or equal to 49 year old.     It is not intended     to diagnose infection nor to     guide or monitor treatment.     Studies: Dg Cholangiogram Operative  06/06/2013   *RADIOLOGY REPORT*  Clinical Data: Gallstone pancreatitis.  Laparoscopic cholecystectomy.  INTRAOPERATIVE CHOLANGIOGRAM  Technique:  Multiple fluoroscopic spot radiographs were obtained during intraoperative cholangiogram and are submitted for interpretation post-operatively.  Comparison: Abdominal ultrasound 06/04/2013.  Findings: Technique:  C-arm fluoroscopic images were obtained intraoperatively and submitted for postoperative interpretation. Please see the performing provider's procedural report for the fluoroscopy time utilized.  On the initial submitted images, there is some leakage of the contrast from the injection site, extending medially and laterally. This does not appear to increase in volume during the injection. There is no evidence of retained calculus or ductal obstruction. There is drainage into the duodenum.  IMPRESSION:  1.  No evidence of retained calculus or ductal obstruction. 2.  Apparent leakage at the injection site.  This does not appear to increase in volume during injection to suggest a bile leak - correlate clinically.   Original Report Authenticated By: Carey Bullocks, M.D.    Scheduled Meds: . ampicillin-sulbactam (UNASYN) IV  3 g Intravenous Q6H  . labetalol  5 mg Intravenous BID   . sodium chloride  3 mL Intravenous Q12H   Continuous Infusions: . 0.9 % NaCl with KCl 40 mEq / L 125 mL/hr at 06/07/13 0013    Principal Problem:   Acute pancreatitis Active Problems:   HYPERTENSION   Gallstones    Time spent: 40 minutes   WOODS, CURTIS, J  [facility] Pager [phone]. If 7PM-7AM, please contact night-coverage at [email or web address], password [facility] 06/07/2013, 2:58 PM  LOS: 3 days

## 2013-06-08 ENCOUNTER — Encounter (HOSPITAL_COMMUNITY): Payer: Self-pay | Admitting: General Surgery

## 2013-06-08 DIAGNOSIS — K859 Acute pancreatitis without necrosis or infection, unspecified: Secondary | ICD-10-CM | POA: Diagnosis not present

## 2013-06-08 DIAGNOSIS — K8020 Calculus of gallbladder without cholecystitis without obstruction: Secondary | ICD-10-CM | POA: Diagnosis not present

## 2013-06-08 DIAGNOSIS — I10 Essential (primary) hypertension: Secondary | ICD-10-CM | POA: Diagnosis not present

## 2013-06-08 LAB — GLUCOSE, CAPILLARY
Glucose-Capillary: 102 mg/dL — ABNORMAL HIGH (ref 70–99)
Glucose-Capillary: 95 mg/dL (ref 70–99)
Glucose-Capillary: 112 mg/dL — ABNORMAL HIGH (ref 70–99)

## 2013-06-08 NOTE — Progress Notes (Signed)
2 Days Post-Op  Subjective: Pt states he is ready to go home.  +flatus, no bm yet.  Tolerating diet, voiding without difficulties.    Objective: Vital signs in last 24 hours: Temp:  [98.7 F (37.1 C)-99.6 F (37.6 C)] 99.6 F (37.6 C) (07/01 0532) Pulse Rate:  [68-75] 69 (07/01 0532) Resp:  [17-20] 17 (07/01 0532) BP: (151-156)/(84-90) 151/89 mmHg (07/01 0532) SpO2:  [93 %-96 %] 93 % (07/01 0532) Weight:  [207 lb 4.8 oz (94.031 kg)] 207 lb 4.8 oz (94.031 kg) (07/01 0532) Last BM Date: 06/05/13  Intake/Output from previous day: 06/30 0701 - 07/01 0700 In: 700 [P.O.:600; IV Piggyback:100] Out: -  Intake/Output this shift:    GI: soft round and non distended.  +BS x4 quadrants.  Incision sites are clean dry and intact.  No HSM  Lab Results:   Recent Labs  06/06/13 0600  WBC 13.8*  HGB 14.7  HCT 43.0  PLT 209   BMET  Recent Labs  06/06/13 0600  NA 137  K 3.6  CL 103  CO2 27  GLUCOSE 86  BUN 12  CREATININE 0.83  CALCIUM 7.9*   Studies/Results: Dg Cholangiogram Operative  06/06/2013   *RADIOLOGY REPORT*  Clinical Data: Gallstone pancreatitis.  Laparoscopic cholecystectomy.  INTRAOPERATIVE CHOLANGIOGRAM  Technique:  Multiple fluoroscopic spot radiographs were obtained during intraoperative cholangiogram and are submitted for interpretation post-operatively.  Comparison: Abdominal ultrasound 06/04/2013.  Findings: Technique:  C-arm fluoroscopic images were obtained intraoperatively and submitted for postoperative interpretation. Please see the performing provider's procedural report for the fluoroscopy time utilized.  On the initial submitted images, there is some leakage of the contrast from the injection site, extending medially and laterally. This does not appear to increase in volume during the injection. There is no evidence of retained calculus or ductal obstruction. There is drainage into the duodenum.  IMPRESSION:  1.  No evidence of retained calculus or ductal  obstruction. 2.  Apparent leakage at the injection site.  This does not appear to increase in volume during injection to suggest a bile leak - correlate clinically.   Original Report Authenticated By: Carey Bullocks, M.D.    Anti-infectives: Anti-infectives   Start     Dose/Rate Route Frequency Ordered Stop   06/05/13 1300  Ampicillin-Sulbactam (UNASYN) 3 g in sodium chloride 0.9 % 100 mL IVPB     3 g 100 mL/hr over 60 Minutes Intravenous Every 6 hours 06/05/13 1207        Assessment/Plan: LAPAROSCOPIC CHOLECYSTECTOMY WITH INTRAOPERATIVE CHOLANGIOGRAM  -POD #2 -Stable for discharge from surgical perspective  -he does not need further antibiotics  -follow up appt has been made and self care measures were once again reviewed with the patient and wife.   LOS: 4 days    Bonner Puna [facility]  ANP-BC Pager [phone]   06/08/2013 8:30 AM

## 2013-06-08 NOTE — Care Management Note (Signed)
    Page 1 of 1   06/08/2013     3:29:42 PM   CARE MANAGEMENT NOTE 06/08/2013  Patient:  Jon Baker, Jon Baker   Account Number:  [id number]  Date Initiated:  06/08/2013  Documentation initiated by:  Letha Cape  Subjective/Objective Assessment:   dx acute pancreatitis  admit- lives with spouse. pta indep.     Action/Plan:   Anticipated DC Date:  06/08/2013   Anticipated DC Plan:  HOME/SELF CARE      DC Planning Services  CM consult      Choice offered to / List presented to:             Status of service:  Completed, signed off Medicare Important Message given?   (If response is "NO", the following Medicare IM given date fields will be blank) Date Medicare IM given:   Date Additional Medicare IM given:    Discharge Disposition:  HOME/SELF CARE  Per UR Regulation:  Reviewed for med. necessity/level of care/duration of stay  If discussed at Long Length of Stay Meetings, dates discussed:    Comments:  06/08/13 15:25 Letha Cape RN, BSN [phone] patient lives with spouse, pta indep.  patient has medication coverage and transportation at dc.  No needs anticipated.

## 2013-06-08 NOTE — Discharge Summary (Signed)
Physician Discharge Summary  Jon Baker ZOX:[id number] DOB: 03-08-1932 DOA: 06/04/2013  PCP: Willow Ora, MD  Admit date: 06/04/2013 Discharge date: 06/08/2013  Time spent: 30 minutes  Recommendations for Outpatient Follow-up:  1. Acute pancreatitis; patient day 2 S/PLAPAROSCOPIC CHOLECYSTECTOMY WITH INTRAOPERATIVE CHOLANGIOGRAM REPAIR UMBILICAL HERNIA, doing well discharge  2. HTN; restarted  Hyzaar 100-25 mg 1 tab by mouth daily. Dicussed changing HTN regimen, patient not interested and would prefer to see her care physician 3. Gallstones; resolved with cholecystectomy  Discharge Diagnoses:  Principal Problem:   Acute pancreatitis Active Problems:   HYPERTENSION   Gallstones   Discharge Condition: Stable  Diet recommendation: Heart healthy Filed Weights   06/06/13 0555 06/07/13 0512 06/08/13 0532  Weight: 93.305 kg (205 lb 11.2 oz) 94.847 kg (209 lb 1.6 oz) 94.031 kg (207 lb 4.8 oz)    History of present illness:  Jon Baker is a 79 y.o. WM PMHx HTN, presented to the ER because of abdominal pain. Patient's pain is mostly epigastric area. Pain started off last night suddenly severe with nausea vomiting. Early in the day patient had multiple episodes of diarrhea. Since the pain was persistent patient came to the ER. In the ER labs show elevated LFTs and lipase. Sonogram shows gallstones. At this time patient has been admitted for possible gallstone pancreatitis. Patient denies any fever chills. Denies using any recent antibiotics or any new medications. Presently patient is not short of breath and hemodynamically stable. S/P dy 4for LAPAROSCOPIC CHOLECYSTECTOMY WITH INTRAOPERATIVE CHOLANGIOGRAM REPAIR UMBILICAL HERNIA .    Hospital Course:  Admitted on 06/04/2013 with severe nausea and vomiting abdominal pain. Abdominal ultrasound on 06/04/2013 showed Cholelithiasis. No sonographic evidence of acute cholecystitis. Patient elected to have a cystectomy which has resolved his  symptoms   Consultations:  General surgery  Discharge Exam: Filed Vitals:   06/07/13 0512 06/07/13 1402 06/07/13 2100 06/08/13 0532  BP: 160/88 156/84 155/90 151/89  Pulse:  75 68 69  Temp: 98.2 F (36.8 C) 98.7 F (37.1 C) 99.5 F (37.5 C) 99.6 F (37.6 C)  TempSrc: Oral Oral Oral Oral  Resp: 20 20 20 17   Height:      Weight: 94.847 kg (209 lb 1.6 oz)   94.031 kg (207 lb 4.8 oz)  SpO2: 93% 95% 96% 93%   General: Alert, NAD  Cardiovascular: Regular rhythm and rate, negative murmurs rubs gallops, PT/DP pulse 2+  Respiratory: Clear to auscultation bilateral  Abdomen: Appropriately tender, all incision sites negative sign of infection negative discharge  Discharge Instructions   Future Appointments Provider Department Dept Phone   06/29/2013 12:15 PM [facility] Doc Of The Week [facility], [place] [phone]       Medication List    STOP taking these medications       losartan-hydrochlorothiazide 50-12.5 MG per tablet  Commonly known as:  HYZAAR      TAKE these medications       oxyCODONE-acetaminophen 5-325 MG per tablet  Commonly known as:  ROXICET  Take 1 tablet by mouth every 6 (six) hours as needed for pain.       No Known Allergies     Follow-up Information   Follow up On 06/29/2013. (APPOINTMENT TIME: 12 15. Please arrive to your appointment at 11:45 to complete your paperwork.  This is a post operative check with the nurse practitioners with [facility] [place] [facility])       Follow up with Willow Ora, MD In 7 days.   Contact information:  [address] [facility] [address] [place] [address] [phone]        The results of significant diagnostics from this hospitalization (including imaging, microbiology, ancillary and laboratory) are listed below for reference.    Significant Diagnostic Studies: Dg Cholangiogram Operative  06/06/2013   *RADIOLOGY REPORT*  Clinical Data: Gallstone pancreatitis.  Laparoscopic cholecystectomy.   INTRAOPERATIVE CHOLANGIOGRAM  Technique:  Multiple fluoroscopic spot radiographs were obtained during intraoperative cholangiogram and are submitted for interpretation post-operatively.  Comparison: Abdominal ultrasound 06/04/2013.  Findings: Technique:  C-arm fluoroscopic images were obtained intraoperatively and submitted for postoperative interpretation. Please see the performing provider's procedural report for the fluoroscopy time utilized.  On the initial submitted images, there is some leakage of the contrast from the injection site, extending medially and laterally. This does not appear to increase in volume during the injection. There is no evidence of retained calculus or ductal obstruction. There is drainage into the duodenum.  IMPRESSION:  1.  No evidence of retained calculus or ductal obstruction. 2.  Apparent leakage at the injection site.  This does not appear to increase in volume during injection to suggest a bile leak - correlate clinically.   Original Report Authenticated By: Carey Bullocks, M.D.   US Abdomen Complete  06/04/2013   *RADIOLOGY REPORT*  Clinical Data:  Elevated LFTs.  COMPLETE ABDOMINAL ULTRASOUND  Comparison:  None.  Findings:  Gallbladder:  Multiple gallstones within the gallbladder.  Sludge also noted.  Gallbladder wall upper normal in thickness at 2.4 mm. Negative sonographic Murphy's.  Common bile duct:   Normal caliber, 6 mm.  No visible ductal stones.  The distal duct is not visualized due to overlying bowel gas.  Liver:  No focal lesion identified.  Within normal limits in parenchymal echogenicity.  IVC:  Appears normal.  Pancreas:  No focal abnormality seen.  Spleen:  Within normal limits in size and echotexture.  Right Kidney:   2.7 cm benign-appearing cyst superolaterally.  No hydronephrosis.  Normal echotexture.  Left Kidney:  Multiple cysts, the largest 3.1 cm superior laterally.  No hydronephrosis.  Normal echotexture.  Abdominal aorta:  No aneurysm identified.   IMPRESSION: Cholelithiasis.  No sonographic evidence of acute cholecystitis. No visible ductal stones.  The distal duct cannot be visualized.  Bilateral renal cysts.   Original Report Authenticated By: Charlett Nose, M.D.   Dg Chest Port 1 View  06/04/2013   *RADIOLOGY REPORT*  Clinical Data: Upper abdominal pain.  PORTABLE CHEST - 1 VIEW  Comparison: 01/05/2013  Findings: Mild hyperinflation of the lungs.  Heart is upper limits normal in size.  No confluent airspace opacities.  No effusions. No acute bony abnormality.  IMPRESSION: Mild hyperinflation.  No acute findings.   Original Report Authenticated By: Charlett Nose, M.D.    Microbiology: Recent Results (from the past 240 hour(s))  SURGICAL PCR SCREEN     Status: None   Collection Time    06/05/13  3:30 PM      Result Value Range Status   MRSA, PCR NEGATIVE  NEGATIVE Final   Staphylococcus aureus NEGATIVE  NEGATIVE Final   Comment:            The Xpert SA Assay (FDA     approved for NASAL specimens     in patients over 60 years of age),     is one component of     a comprehensive surveillance     program.  Test performance has     been validated  by [facility] for patients greater     than or equal to 68 year old.     It is not intended     to diagnose infection nor to     guide or monitor treatment.     Labs: Basic Metabolic Panel:  Recent Labs Lab 06/04/13 0213 06/04/13 0855 06/05/13 0520 06/06/13 0600  NA 140 139 141 137  K 3.3* 3.6 3.1* 3.6  CL 101 102 105 103  CO2 29 29 28 27   GLUCOSE 186* 126* 92 86  BUN 21 20 18 12   CREATININE 1.03 0.99 0.99 0.83  CALCIUM 8.9 8.7 7.9* 7.9*   Liver Function Tests:  Recent Labs Lab 06/04/13 0213 06/04/13 0855 06/05/13 0520 06/06/13 0600  AST 477* 272* 89* 36  ALT 364* 342* 195* 107*  ALKPHOS 121* 118* 101 93  BILITOT 1.7* 1.1 1.2 1.1  PROT 6.9 6.6 6.0 5.8*  ALBUMIN 3.9 3.7 3.1* 2.9*    Recent Labs Lab 06/04/13 0213 06/05/13 0520 06/06/13 0600  LIPASE  >3000* 263* 61*   No results found for this basename: AMMONIA,  in the last 168 hours CBC:  Recent Labs Lab 06/04/13 0213 06/04/13 0855 06/05/13 0520 06/06/13 0600  WBC 14.6* 15.4* 16.7* 13.8*  NEUTROABS 11.7* 13.5*  --   --   HGB 16.4 15.7 14.9 14.7  HCT 45.1 44.9 43.0 43.0  MCV 89.1 89.8 91.7 91.7  PLT 265 255 233 209   Cardiac Enzymes: No results found for this basename: CKTOTAL, CKMB, CKMBINDEX, TROPONINI,  in the last 168 hours BNP: BNP (last 3 results) No results found for this basename: PROBNP,  in the last 8760 hours CBG:  Recent Labs Lab 06/07/13 1135 06/07/13 1730 06/08/13 0101 06/08/13 0707 06/08/13 1157  GLUCAP 115* 115* 102* 95 112*       Signed:  Jayce Kainz, J  [facility] 06/08/2013, 12:09 PM

## 2013-06-08 NOTE — Progress Notes (Signed)
NURSING PROGRESS NOTE  Jon Baker [id number] Discharge Data: 06/08/2013 12:09 PM Attending Provider: Drema Dallas, MD ONG:EXBM Drue Novel, MD     [name] to be D/C'd Home per MD order.  Discussed with the patient the After Visit Summary and all questions fully answered. All IV's discontinued with no bleeding noted. All belongings returned to patient for patient to take home.   Last Vital Signs:  Blood pressure 151/89, pulse 69, temperature 99.6 F (37.6 C), temperature source Oral, resp. rate 17, height 5' 10.5" (1.791 m), weight 94.031 kg (207 lb 4.8 oz), SpO2 93.00%.  Discharge Medication List   Medication List    STOP taking these medications       losartan-hydrochlorothiazide 50-12.5 MG per tablet  Commonly known as:  HYZAAR      TAKE these medications       oxyCODONE-acetaminophen 5-325 MG per tablet  Commonly known as:  ROXICET  Take 1 tablet by mouth every 6 (six) hours as needed for pain.

## 2013-06-08 NOTE — Progress Notes (Signed)
Primary team may discharge the patient today if no other medical issues.  Wilmon Arms. Corliss Skains, MD, [facility]  General/ Trauma Surgery  06/08/2013 8:33 AM

## 2013-06-15 ENCOUNTER — Ambulatory Visit (INDEPENDENT_AMBULATORY_CARE_PROVIDER_SITE_OTHER): Payer: Medicare Other | Admitting: Internal Medicine

## 2013-06-15 ENCOUNTER — Encounter: Payer: Self-pay | Admitting: Internal Medicine

## 2013-06-15 VITALS — BP 152/90 | HR 68 | Temp 98.3°F | Wt 199.0 lb

## 2013-06-15 DIAGNOSIS — R0609 Other forms of dyspnea: Secondary | ICD-10-CM | POA: Diagnosis not present

## 2013-06-15 DIAGNOSIS — N401 Enlarged prostate with lower urinary tract symptoms: Secondary | ICD-10-CM

## 2013-06-15 DIAGNOSIS — R42 Dizziness and giddiness: Secondary | ICD-10-CM

## 2013-06-15 DIAGNOSIS — K8020 Calculus of gallbladder without cholecystitis without obstruction: Secondary | ICD-10-CM | POA: Diagnosis not present

## 2013-06-15 DIAGNOSIS — N138 Other obstructive and reflux uropathy: Secondary | ICD-10-CM

## 2013-06-15 DIAGNOSIS — M1990 Unspecified osteoarthritis, unspecified site: Secondary | ICD-10-CM

## 2013-06-15 DIAGNOSIS — R0989 Other specified symptoms and signs involving the circulatory and respiratory systems: Secondary | ICD-10-CM | POA: Diagnosis not present

## 2013-06-15 DIAGNOSIS — F329 Major depressive disorder, single episode, unspecified: Secondary | ICD-10-CM

## 2013-06-15 DIAGNOSIS — I10 Essential (primary) hypertension: Secondary | ICD-10-CM

## 2013-06-15 MED ORDER — TAMSULOSIN HCL 0.4 MG PO CAPS: 0.4000 mg | ORAL_CAPSULE | Freq: Every day | ORAL | Status: DC

## 2013-06-15 NOTE — Assessment & Plan Note (Addendum)
Patient appeared depressed, PHQ-9 scored 10 which is mild to moderate depression. I think he is feeling unwell emotionally because he is not able to do much due to DOE-dizziness I offered SSRIs, he clearly declined . Plan: recheck  on RTC

## 2013-06-15 NOTE — Assessment & Plan Note (Signed)
Recently admitted with pancreatitis, status post cholecystectomy. He seems to be healing well. Plan:CBC and LFTs

## 2013-06-15 NOTE — Assessment & Plan Note (Addendum)
BP today slightly elevared, no change for now. Reassess in 6 weeks

## 2013-06-15 NOTE — Progress Notes (Signed)
  Subjective:    Patient ID: Jon Baker, male    DOB: [birth year], 79 y.o.   MRN: [id number]  HPI Hospital followup, admitted 06/04/2012, discharge few days later. Was diagnosed with pancreatitis, gallbladder stones, status post cholecystectomy. Discharge summary, labs and x-rays reviewed. He seems to be doing well from the post op standpoint however he has a number of other chronic issues. See below:  Today reports ongoing dizziness for months, exclusively when he laid down or stand up, not associated with slurred speech, face paresthesias or motor deficits. Also feels his legs wobbly from time to time. I notice him to be depressed, the patient and the wife agree with me, he feels depressed mostly because he is not able to do anything d/t to dyspnea on exertion. I note that some weight loss as well, he denies headaches, fever, chills, myalgias or arthralgias.  Past Medical History  Diagnosis Date  . DJD (degenerative joint disease)   . Chest pain     2009, saw  cardiology , decline a  stress test  . HTN (hypertension)   . Claudication     2009, saw  cardiology, declined ABIs  . COPD (chronic obstructive pulmonary disease)     states he has been told by MD he has copd  . Shortness of breath   . Pancreatitis, gallstone 05/2013   Past Surgical History  Procedure Laterality Date  . Myleogram  1979  . Total knee arthroplasty Right 02-2011    Dr Despina Hick  . Glanglian cyst removal Left 1970's  . Cholecystectomy N/A 06/06/2013    Procedure: LAPAROSCOPIC CHOLECYSTECTOMY WITH INTRAOPERATIVE CHOLANGIOGRAM;  Surgeon: Liz Malady, MD;  Location: [facility] OR;  Service: General;  Laterality: N/A;   History   Social History  . Marital Status: Married    Spouse Name: N/A    Number of Children: N/A  . Years of Education: N/A   Occupational History  . Not on file.   Social History Main Topics  . Smoking status: Former Smoker    Quit date: 12/10/1987  . Smokeless tobacco: Never Used  .  Alcohol Use: No  . Drug Use: No  . Sexually Active: Not on file   Other Topics Concern  . Not on file   Social History Narrative  . No narrative on file  \  Review of Systems Appetite is good, good by mouth tolerance. No nausea, vomiting, diarrhea or blood in the stools. Patientdenies having memory issues, wife agrees.     Objective:   Physical Exam BP 152/90  Pulse 68  Temp(Src) 98.3 F (36.8 C) (Oral)  Wt 199 lb (90.266 kg)  BMI 28.14 kg/m2  SpO2 94%  General -- alert, well-developed.Clearly he got dizzy when I asked him to lie down in the examining table .   Neck -- normal carotid pulse   Lungs -- normal respiratory effort, no intercostal retractions, no accessory muscle use, and normal breath sounds.   Heart-- normal rate, regular rhythm, no murmur, and no gallop.   Abdomen-- Surgical scars without redness or discharge. Abdomen is soft, good bowel sounds. No mass or rebound, mildly tender throughout. Extremities-- no pretibial edema bilaterally, Good pedal pulses bilaterally  Neurologic-- alert & oriented X3 and strength normal in all extremities.Speech clear. Psych-- Cognition and judgment appear intact. Alert and cooperative with normal attention span and concentration. Looks  slightly depressed.     Assessment & Plan:

## 2013-06-15 NOTE — Assessment & Plan Note (Addendum)
Ongoing symptom. Had a normal echocardiogram and mild COPD per PFTs on 01-2013. Was recommended to [place] but he never did. Plan -- samples provided, RTC 6 weeks

## 2013-06-15 NOTE — Patient Instructions (Addendum)
Take Flomax at night, let me know if you have side effects. Check the  blood pressure 2 or 3 times a week, be sure it is between 110/60 and 140/85. If it is consistently higher or lower, let me know Come back in 6 weeks

## 2013-06-15 NOTE — Assessment & Plan Note (Signed)
Consistent dizziness with head motion, On chart review this is going on since 2011 at least, at that time amlodipine was suspected to be the culprit and it was discontinued however symptoms did not resolve. A CT head 2011 show no acute changes. Plan: ENT , vestibular  rehabilitation?[name]

## 2013-06-15 NOTE — Assessment & Plan Note (Addendum)
Continue with severe nocturia,   trial with Flomax, if not better will refer to urology. To take Flomax at night

## 2013-06-16 NOTE — Addendum Note (Signed)
Addended by: Silvio Pate D on: 06/16/2013 04:42 PM   Modules accepted: Orders

## 2013-06-22 DIAGNOSIS — H9110 Presbycusis, unspecified ear: Secondary | ICD-10-CM | POA: Diagnosis not present

## 2013-06-22 DIAGNOSIS — H8110 Benign paroxysmal vertigo, unspecified ear: Secondary | ICD-10-CM | POA: Diagnosis not present

## 2013-06-29 ENCOUNTER — Encounter (INDEPENDENT_AMBULATORY_CARE_PROVIDER_SITE_OTHER): Payer: Self-pay

## 2013-06-29 ENCOUNTER — Ambulatory Visit (INDEPENDENT_AMBULATORY_CARE_PROVIDER_SITE_OTHER): Payer: Medicare Other | Admitting: Internal Medicine

## 2013-06-29 VITALS — BP 132/88 | HR 68 | Temp 97.9°F | Resp 14 | Ht 70.5 in | Wt 202.4 lb

## 2013-06-29 DIAGNOSIS — K859 Acute pancreatitis without necrosis or infection, unspecified: Secondary | ICD-10-CM

## 2013-06-29 DIAGNOSIS — K851 Biliary acute pancreatitis without necrosis or infection: Secondary | ICD-10-CM

## 2013-06-29 NOTE — Patient Instructions (Addendum)
May resume regular activity without restrictions. Follow up as needed. Call with questions or concerns.  

## 2013-06-29 NOTE — Progress Notes (Signed)
  Subjective: Pt returns to the clinic today after undergoing laparoscopic cholecystectomy on 06/06/13 by Dr. Janee Morn.  The patient is tolerating their diet well and is having no severe pain.  Bowel function is good.  No problems with the wounds.  Objective: Vital signs in last 24 hours: Reviewed  PE: Abd: soft, non-tender, +bs, incisions well healed  Lab Results:  No results found for this basename: WBC, HGB, HCT, PLT,  in the last 72 hours BMET No results found for this basename: NA, K, CL, CO2, GLUCOSE, BUN, CREATININE, CALCIUM,  in the last 72 hours PT/INR No results found for this basename: LABPROT, INR,  in the last 72 hours CMP     Component Value Date/Time   NA 137 06/06/2013 0600   K 3.6 06/06/2013 0600   CL 103 06/06/2013 0600   CO2 27 06/06/2013 0600   GLUCOSE 86 06/06/2013 0600   BUN 12 06/06/2013 0600   CREATININE 0.83 06/06/2013 0600   CALCIUM 7.9* 06/06/2013 0600   PROT 5.8* 06/06/2013 0600   ALBUMIN 2.9* 06/06/2013 0600   AST 36 06/06/2013 0600   ALT 107* 06/06/2013 0600   ALKPHOS 93 06/06/2013 0600   BILITOT 1.1 06/06/2013 0600   GFRNONAA 82* 06/06/2013 0600   GFRAA >90 06/06/2013 0600   Lipase     Component Value Date/Time   LIPASE 61* 06/06/2013 0600       Studies/Results: No results found.  Anti-infectives: Anti-infectives   None       Assessment/Plan  1.  S/P Laparoscopic Cholecystectomy: doing well, may resume regular activity without restrictions, Pt will follow up with [place] PRN and knows to call with questions or concerns.     WHITE, ELIZABETH 06/29/2013

## 2013-07-22 ENCOUNTER — Other Ambulatory Visit: Payer: Self-pay

## 2013-07-27 ENCOUNTER — Encounter: Payer: Self-pay | Admitting: Internal Medicine

## 2013-07-27 ENCOUNTER — Ambulatory Visit (INDEPENDENT_AMBULATORY_CARE_PROVIDER_SITE_OTHER): Payer: Medicare Other | Admitting: Internal Medicine

## 2013-07-27 ENCOUNTER — Other Ambulatory Visit: Payer: Self-pay | Admitting: *Deleted

## 2013-07-27 VITALS — BP 130/90 | HR 75 | Temp 97.8°F | Wt 205.6 lb

## 2013-07-27 DIAGNOSIS — I10 Essential (primary) hypertension: Secondary | ICD-10-CM

## 2013-07-27 DIAGNOSIS — R42 Dizziness and giddiness: Secondary | ICD-10-CM | POA: Diagnosis not present

## 2013-07-27 DIAGNOSIS — K8020 Calculus of gallbladder without cholecystitis without obstruction: Secondary | ICD-10-CM

## 2013-07-27 DIAGNOSIS — N138 Other obstructive and reflux uropathy: Secondary | ICD-10-CM

## 2013-07-27 DIAGNOSIS — R0609 Other forms of dyspnea: Secondary | ICD-10-CM

## 2013-07-27 LAB — HEPATIC FUNCTION PANEL
AST: 24 U/L (ref 0–37)
Albumin: 3.9 g/dL (ref 3.5–5.2)
Alkaline Phosphatase: 68 U/L (ref 39–117)
Total Bilirubin: 0.9 mg/dL (ref 0.3–1.2)

## 2013-07-27 LAB — CBC WITH DIFFERENTIAL/PLATELET
Basophils Absolute: 0 10*3/uL (ref 0.0–0.1)
Eosinophils Absolute: 0.3 10*3/uL (ref 0.0–0.7)
Lymphocytes Relative: 25.7 % (ref 12.0–46.0)
MCHC: 33.5 g/dL (ref 30.0–36.0)
Monocytes Relative: 11.6 % (ref 3.0–12.0)
Neutro Abs: 4.2 10*3/uL (ref 1.4–7.7)
Platelets: 293 10*3/uL (ref 150.0–400.0)
RDW: 13.3 % (ref 11.5–14.6)

## 2013-07-27 MED ORDER — ACLIDINIUM BROMIDE 400 MCG/ACT IN AEPB: 1.0000 | INHALATION_SPRAY | Freq: Two times a day (BID) | RESPIRATORY_TRACT | Status: DC

## 2013-07-27 MED ORDER — LOSARTAN POTASSIUM-HCTZ 50-12.5 MG PO TABS: 1.0000 | ORAL_TABLET | Freq: Every day | ORAL | Status: DC

## 2013-07-27 NOTE — Assessment & Plan Note (Signed)
Saw ENT, DX with BPPV and SN[facility]. Apparently a manouver was done, feels slightly better

## 2013-07-27 NOTE — Assessment & Plan Note (Signed)
On Tudorza, patient states he is feeling about the same, wife reports that she has noted improvement in his breathing. Took about see pulmonary, he declined. Plan: Continue with [place]

## 2013-07-27 NOTE — Telephone Encounter (Signed)
During OV on 07/27/13 verbal order Dr. Drue Novel to give 6 month supply on losartan_HCTZ. Orders enacted.

## 2013-07-27 NOTE — Assessment & Plan Note (Signed)
Reports good medication compliance, diastolic BP today 90. No change for now

## 2013-07-27 NOTE — Assessment & Plan Note (Signed)
Asymptomatic after surgery. We were unable to recheck a CBC LFTs, we'll try again today.

## 2013-07-27 NOTE — Progress Notes (Signed)
  Subjective:    Patient ID: Jon Baker, male    DOB: October 23, 1932, 79 y.o.   MRN: [id number]  HPI Followup visit Hypertension, good compliance of medication, BP today 130/ 90. Dyspnea on exertion, on Tudorza, the wife thinks it is helping, patient states it makes  no difference. Dizziness, status post ENT eval, feeling slightly better. Flomax, takes it at night, nocturia has decreased.  Past Medical History  Diagnosis Date  . DJD (degenerative joint disease)   . Chest pain     2009, saw  cardiology , decline a  stress test  . HTN (hypertension)   . Claudication     2009, saw  cardiology, declined ABIs  . COPD (chronic obstructive pulmonary disease)     states he has been told by MD he has copd  . Shortness of breath   . Pancreatitis, gallstone 05/2013   Past Surgical History  Procedure Laterality Date  . Myleogram  1979  . Total knee arthroplasty Right 02-2011    Dr Despina Hick  . Glanglian cyst removal Left 1970's  . Cholecystectomy N/A 06/06/2013    Procedure: LAPAROSCOPIC CHOLECYSTECTOMY WITH INTRAOPERATIVE CHOLANGIOGRAM;  Surgeon: Liz Malady, MD;  Location[facility] OR;  Service: General;  Laterality: N/A;     Review of Systems Denies abdominal pain. No nausea, vomiting or diarrhea.     Objective:   Physical Exam BP 130/90  Pulse 75  Temp(Src) 97.8 F (36.6 C) (Oral)  Wt 205 lb 9.6 oz (93.26 kg)  BMI 29.07 kg/m2  SpO2 96% General -- alert, well-developed, NAD.  Lungs -- normal respiratory effort, no intercostal retractions, no accessory muscle use, and normal breath sounds.  Heart-- normal rate, regular rhythm, no murmur.  Extremities-- no pretibial edema bilaterally  Psych-- Cognition and judgment appear intact. Alert and cooperative with normal attention span and concentration. not anxious appearing and not depressed appearing.      Assessment & Plan:

## 2013-07-27 NOTE — Patient Instructions (Addendum)
Get your blood work before you leave  Next visit in in 5 to 6  for a physical exam  Please make an appointment before you leave the office today (or call few weeks in advance)

## 2013-07-27 NOTE — Assessment & Plan Note (Signed)
Good compliance Flomax, slightly better. No change.

## 2013-09-23 DIAGNOSIS — H65199 Other acute nonsuppurative otitis media, unspecified ear: Secondary | ICD-10-CM | POA: Diagnosis not present

## 2013-09-23 DIAGNOSIS — H6500 Acute serous otitis media, unspecified ear: Secondary | ICD-10-CM | POA: Diagnosis not present

## 2013-10-18 ENCOUNTER — Other Ambulatory Visit: Payer: Self-pay | Admitting: Internal Medicine

## 2013-10-18 NOTE — Telephone Encounter (Signed)
Tamsulosin refill sent to pharamcy

## 2013-10-19 ENCOUNTER — Other Ambulatory Visit: Payer: Self-pay | Admitting: Internal Medicine

## 2013-10-19 NOTE — Telephone Encounter (Signed)
Losartan-hctz refilled

## 2013-11-23 ENCOUNTER — Telehealth: Payer: Self-pay | Admitting: Internal Medicine

## 2013-11-23 MED ORDER — ACLIDINIUM BROMIDE 400 MCG/ACT IN AEPB: 1.0000 | INHALATION_SPRAY | Freq: Two times a day (BID) | RESPIRATORY_TRACT | Status: DC

## 2013-11-23 NOTE — Telephone Encounter (Signed)
Please send a Rx x 1 and 5 RF

## 2013-11-23 NOTE — Addendum Note (Signed)
Addended by: Eustace Quail on: 11/23/2013 05:49 PM   Modules accepted: Orders

## 2013-11-23 NOTE — Telephone Encounter (Signed)
Done. DJR  

## 2013-11-23 NOTE — Telephone Encounter (Signed)
Patient's daughter is calling to request that we send an rx for the patient's Aclidinium Bromide (TUDORZA PRESSAIR) inhaler to [facility] [address]. She states that when the prescription was originally written and the [facility] he decided to rip up the rx when he found out at the pharmacy how much is costs. His daughter Revonda Standard wants [place] to send it to the pharmacy for him so that she can pay for it and pick it up for him.

## 2013-11-26 ENCOUNTER — Telehealth: Payer: Self-pay | Admitting: *Deleted

## 2013-11-26 NOTE — Telephone Encounter (Signed)
Patient daughter called and stated that [facility] not have Aclidinium Bromide (TUDORZA PRESSAIR) 400 MCG/ACT AEPB in stock and wanted to see if dr Drue Novel could write something else for her father.

## 2013-11-29 NOTE — Telephone Encounter (Signed)
Try Spiriva one inhaler daily #30, 3 RF

## 2013-11-30 ENCOUNTER — Other Ambulatory Visit: Payer: Self-pay | Admitting: *Deleted

## 2013-11-30 MED ORDER — TIOTROPIUM BROMIDE MONOHYDRATE 18 MCG IN CAPS: 18.0000 ug | ORAL_CAPSULE | Freq: Every day | RESPIRATORY_TRACT | Status: DC

## 2013-11-30 NOTE — Telephone Encounter (Signed)
Spiriva e-scribed to pharmacy. JG//CMA

## 2013-12-24 ENCOUNTER — Encounter: Payer: Self-pay | Admitting: Cardiology

## 2014-01-19 DIAGNOSIS — H25099 Other age-related incipient cataract, unspecified eye: Secondary | ICD-10-CM | POA: Diagnosis not present

## 2014-03-21 ENCOUNTER — Other Ambulatory Visit: Payer: Self-pay | Admitting: Internal Medicine

## 2014-04-19 DIAGNOSIS — R059 Cough, unspecified: Secondary | ICD-10-CM | POA: Diagnosis not present

## 2014-04-19 DIAGNOSIS — R05 Cough: Secondary | ICD-10-CM | POA: Diagnosis not present

## 2014-04-19 DIAGNOSIS — L989 Disorder of the skin and subcutaneous tissue, unspecified: Secondary | ICD-10-CM | POA: Diagnosis not present

## 2014-04-19 DIAGNOSIS — I10 Essential (primary) hypertension: Secondary | ICD-10-CM | POA: Diagnosis not present

## 2014-04-19 DIAGNOSIS — J069 Acute upper respiratory infection, unspecified: Secondary | ICD-10-CM | POA: Diagnosis not present

## 2014-06-12 IMAGING — US US ABDOMEN COMPLETE
1 series · 14 of 25 positions shown · non-contrast
Comparison: None.

CLINICAL DATA: Elevated LFTs.

COMPLETE ABDOMINAL ULTRASOUND

[Series 1: us abdomen complete · 0.23mm/px · 14 of 106 slices shown]
[im 1/106]
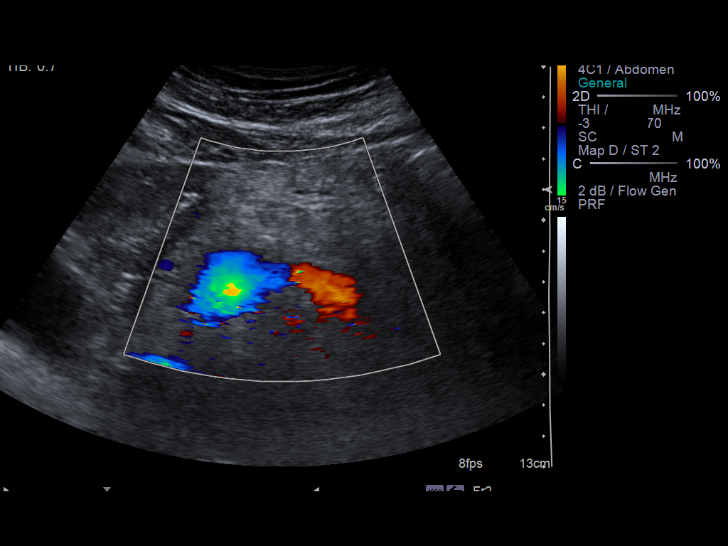
[im 9/106]
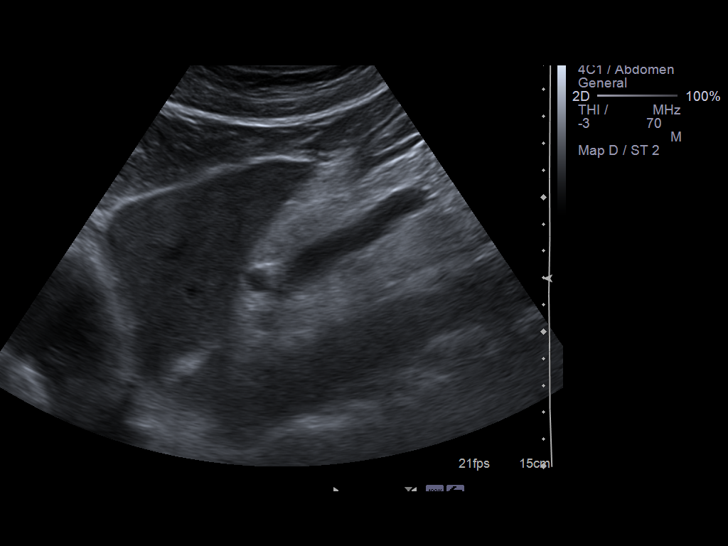
[im 18/106]
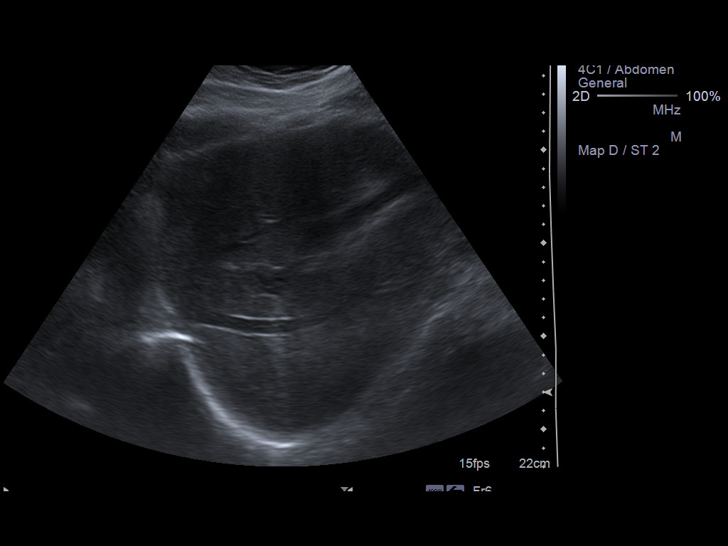
[im 27/106]
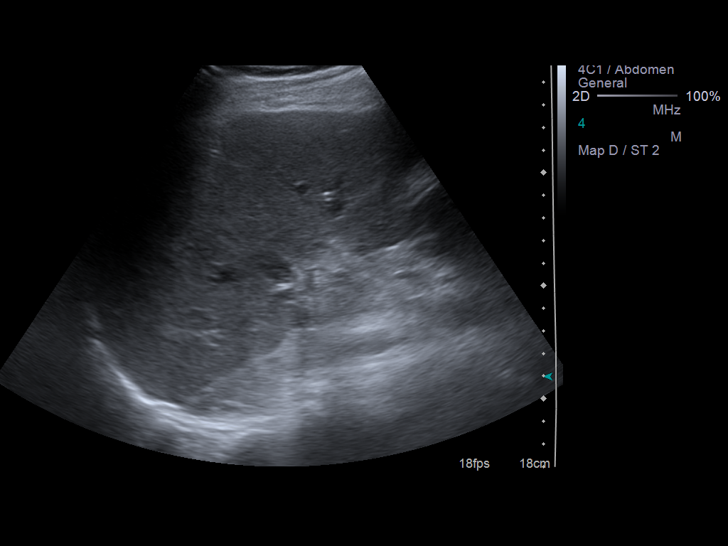
[im 36/106]
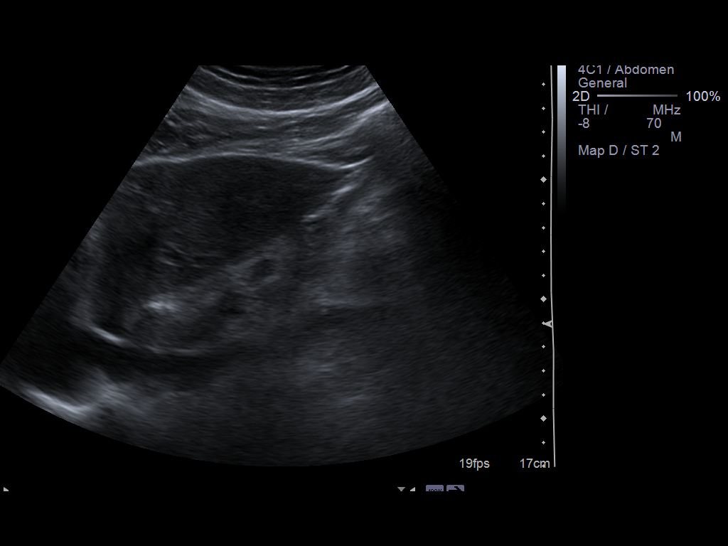
[im 40/106]
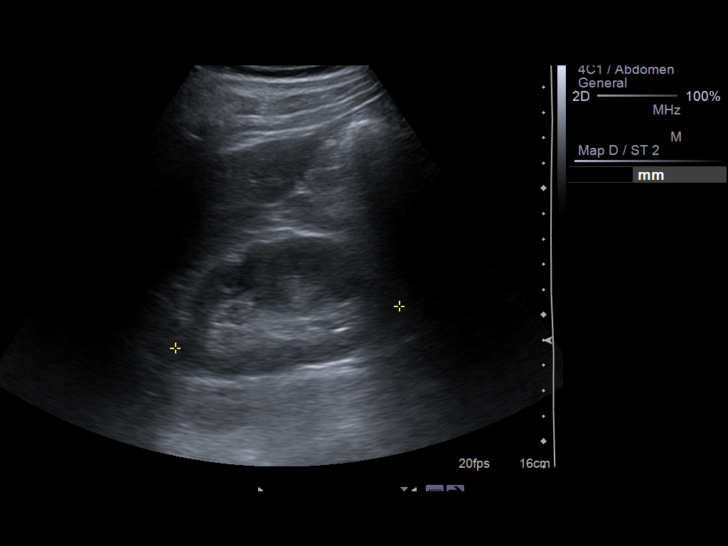
[im 49/106]
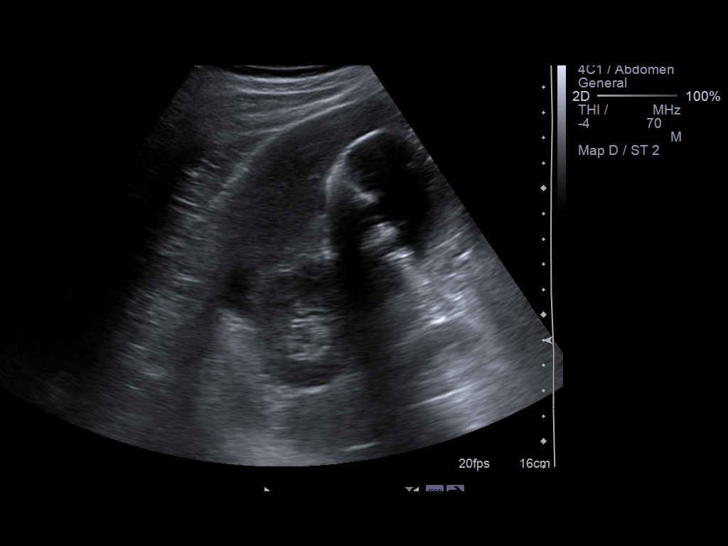
[im 57/106]
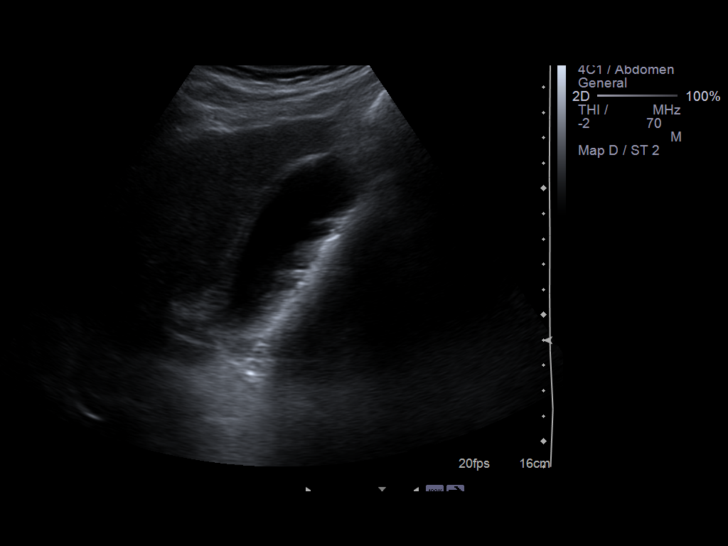
[im 66/106]
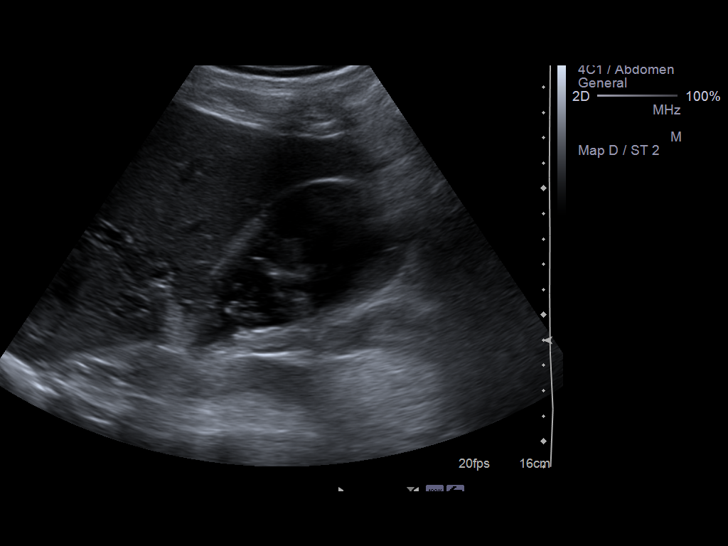
[im 71/106]
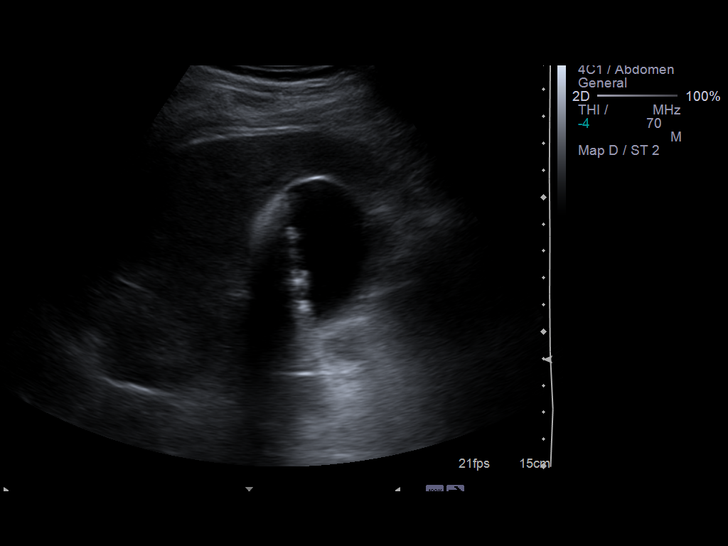
[im 79/106]
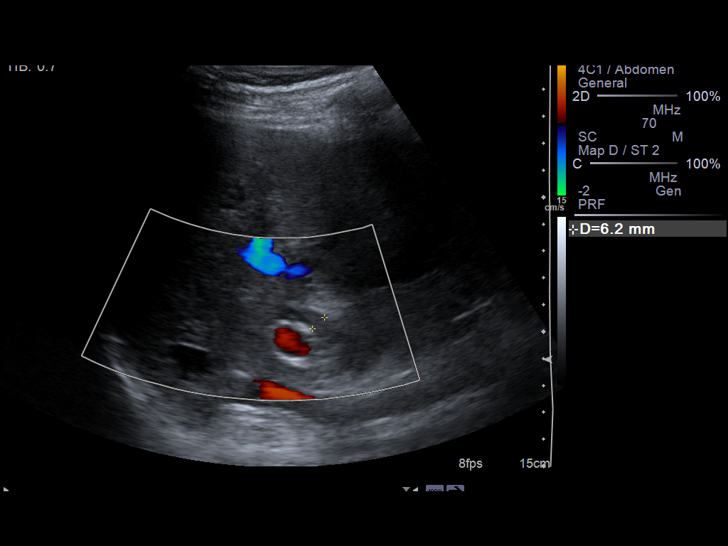
[im 88/106]
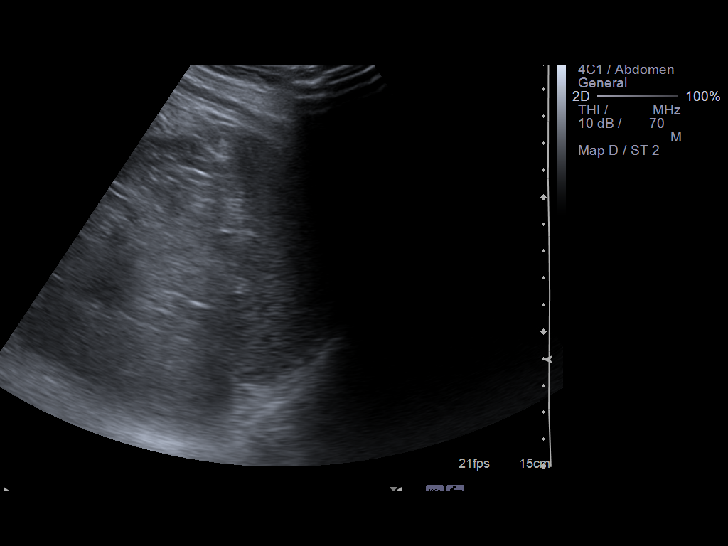
[im 97/106]
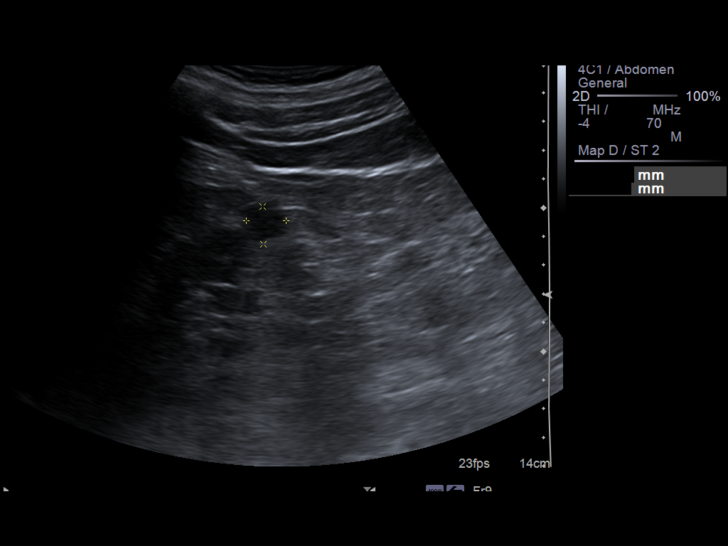
[im 106/106]
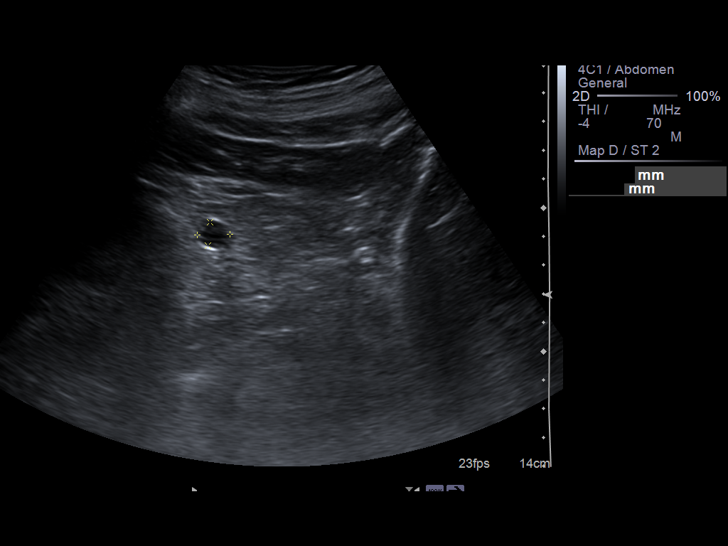

[14 of 25 positions shown; findings below may reference images not displayed]

FINDINGS: Gallbladder:  Multiple gallstones within the gallbladder.  Sludge
also noted.  Gallbladder wall upper normal in thickness at 2.4 mm.
Negative sonographic Yohan.

Common bile duct:   Normal caliber, 6 mm.  No visible ductal
stones.  The distal duct is not visualized due to overlying bowel
gas.

Liver:  No focal lesion identified.  Within normal limits in
parenchymal echogenicity.

IVC:  Appears normal.

Pancreas:  No focal abnormality seen.

Spleen:  Within normal limits in size and echotexture.

Right Kidney:   2.7 cm benign-appearing cyst superolaterally.  No
hydronephrosis.  Normal echotexture.

Left Kidney:  Multiple cysts, the largest 3.1 cm superior
laterally.  No hydronephrosis.  Normal echotexture.

Abdominal aorta:  No aneurysm identified.
IMPRESSION: Cholelithiasis.  No sonographic evidence of acute cholecystitis.
No visible ductal stones.  The distal duct cannot be visualized.

Bilateral renal cysts.

## 2014-07-29 ENCOUNTER — Ambulatory Visit (INDEPENDENT_AMBULATORY_CARE_PROVIDER_SITE_OTHER): Payer: Medicare Other | Admitting: Internal Medicine

## 2014-07-29 ENCOUNTER — Encounter: Payer: Self-pay | Admitting: Internal Medicine

## 2014-07-29 VITALS — BP 138/78 | HR 67 | Temp 98.5°F | Wt 212.4 lb

## 2014-07-29 DIAGNOSIS — I10 Essential (primary) hypertension: Secondary | ICD-10-CM | POA: Diagnosis not present

## 2014-07-29 DIAGNOSIS — R42 Dizziness and giddiness: Secondary | ICD-10-CM

## 2014-07-29 DIAGNOSIS — J209 Acute bronchitis, unspecified: Secondary | ICD-10-CM

## 2014-07-29 DIAGNOSIS — J438 Other emphysema: Secondary | ICD-10-CM | POA: Diagnosis not present

## 2014-07-29 MED ORDER — ALBUTEROL SULFATE HFA 108 (90 BASE) MCG/ACT IN AERS: 2.0000 | INHALATION_SPRAY | Freq: Four times a day (QID) | RESPIRATORY_TRACT | Status: DC | PRN

## 2014-07-29 MED ORDER — LOSARTAN POTASSIUM-HCTZ 50-12.5 MG PO TABS: 1.0000 | ORAL_TABLET | Freq: Every day | ORAL | Status: DC

## 2014-07-29 MED ORDER — DOXYCYCLINE HYCLATE 100 MG PO TABS
100.0000 mg | ORAL_TABLET | Freq: Two times a day (BID) | ORAL | Status: DC
Start: 2014-07-29 — End: 2014-08-31

## 2014-07-29 MED ORDER — ACLIDINIUM BROMIDE 400 MCG/ACT IN AEPB: 1.0000 | INHALATION_SPRAY | Freq: Two times a day (BID) | RESPIRATORY_TRACT | Status: DC

## 2014-07-29 NOTE — Assessment & Plan Note (Addendum)
Currently taking no medications, tudorsa and spiriva were  Expensive; while taken those meds the patient did not "feel better"  but the patient's wife told me today that he did look better. Plan: Prescription for tudorza  provided, we'll see if he is able to afford it this year. Also prescription for ventolin provided , concept of recue inhaler discussed

## 2014-07-29 NOTE — Patient Instructions (Signed)
Get your blood work before you leave   TUDORZA  2 puffs twice a day everyday whether you have cough or not  You have bronchitis: Drink plenty of fluids, take Mucinex DM OTC 1 tablet twice a day until better Doxycycline for one week (antibiotic) If you feel a lot of chest congestion or frequent cough: Use Ventolin only as needed, this is a rescue inhaler. Call if not improving in few days   Next visit is for a physical exam in 3 months ,  fasting Please make an appointment

## 2014-07-29 NOTE — Assessment & Plan Note (Signed)
Symptoms well-controlled, refill medications, check a BMP. Return to the office in 3 months for a physical

## 2014-07-29 NOTE — Progress Notes (Signed)
Subjective:    Patient ID: Jon Baker, male    DOB: 1932/02/28, 80 y.o.   MRN: [id number]  DOS:  07/29/2014 Type of visit - description: routine, last OV 07-2013, here w/  wife History: Hypertension, good medication compliance, needs a refill. BP today is satisfactory COPD--tudorza and spiriva helped his breathing but were very expensive, currently not taking any inhaler regularly. Bronchitis, developed cough and chest congestion 3 days ago, some chest pain anteriorly with cough only. Not taking any specific medication for his symptoms. Imbalance, reports a few falls over the last year, no major injuries, denies dizziness per see problem is just lack of balance.    ROS No fever No sinus pain or discharge but does feel some congestion. No chest pain. No lower extremity edema. Dyspnea on exertion is at baseline, able to go all his activities of daily living without problems. Denies any wheezing. No hemoptysis. No headache, dizziness, diplopia or slurred speech   Past Medical History  Diagnosis Date  . DJD (degenerative joint disease)   . Chest pain     2009, saw  cardiology , decline a  stress test  . HTN (hypertension)   . Claudication     2009, saw  cardiology, declined ABIs  . COPD (chronic obstructive pulmonary disease)     states he has been told by MD he has copd  . Shortness of breath   . Pancreatitis, gallstone 05/2013    Past Surgical History  Procedure Laterality Date  . Myleogram  1979  . Total knee arthroplasty Right 02-2011    Dr Despina Hick  . Glanglian cyst removal Left 1970's  . Cholecystectomy N/A 06/06/2013    Procedure: LAPAROSCOPIC CHOLECYSTECTOMY WITH INTRAOPERATIVE CHOLANGIOGRAM;  Surgeon: Liz Malady, MD;  Location: [facility] OR;  Service: General;  Laterality: N/A;    History   Social History  . Marital Status: Married    Spouse Name: N/A    Number of Children: N/A  . Years of Education: N/A   Occupational History  . Not on file.   Social  History Main Topics  . Smoking status: Former Smoker    Quit date: 12/10/1987  . Smokeless tobacco: Never Used  . Alcohol Use: No  . Drug Use: No  . Sexual Activity: Not on file   Other Topics Concern  . Not on file   Social History Narrative  . No narrative on file        Medication List       This list is accurate as of: 07/29/14 11:59 PM.  Always use your most recent med list.               Aclidinium Bromide 400 MCG/ACT Aepb  Commonly known as:  TUDORZA PRESSAIR  Inhale 1 Inhaler into the lungs 2 (two) times daily.     albuterol 108 (90 BASE) MCG/ACT inhaler  Commonly known as:  VENTOLIN HFA  Inhale 2 puffs into the lungs every 6 (six) hours as needed for wheezing or shortness of breath.     doxycycline 100 MG tablet  Commonly known as:  VIBRA-TABS  Take 1 tablet (100 mg total) by mouth 2 (two) times daily.     losartan-hydrochlorothiazide 50-12.5 MG per tablet  Commonly known as:  HYZAAR  Take 1 tablet by mouth daily.           Objective:   Physical Exam BP 138/78  Pulse 67  Temp(Src) 98.5 F (36.9 C) (Oral)  Wt 212 lb  6 oz (96.333 kg)  SpO2 92% General -- alert, well-developed, NAD.  HEENT-- Not pale. TMs normal, throat symmetric, no redness or discharge. Face symmetric, sinuses not tender to palpation. Nose slt congested. Lungs -- normal respiratory effort, no intercostal retractions, no accessory muscle use, and few B ronchi and wheezes, no crackles   Extremities-- no pretibial edema bilaterally  Neurologic--  alert & oriented X3. Speech normal, gait appropriate for age, strength symmetric and appropriate for age.  Psych-- Cognition and judgment appear intact. Cooperative with normal attention span and concentration. No anxious or depressed appearing.        Assessment & Plan:    Bronchitis, see instructions

## 2014-07-29 NOTE — Assessment & Plan Note (Signed)
Today he reports no dizziness but imbalance. Offered a  referral to PT, he will call if interested

## 2014-07-29 NOTE — Progress Notes (Signed)
Pre-visit discussion using our clinic review tool. No additional management support is needed unless otherwise documented below in the visit note.  

## 2014-07-30 LAB — BASIC METABOLIC PANEL
BUN: 18 mg/dL (ref 6–23)
CO2: 28 mEq/L (ref 19–32)
CREATININE: 1.04 mg/dL (ref 0.50–1.35)
Calcium: 9.1 mg/dL (ref 8.4–10.5)
Chloride: 99 mEq/L (ref 96–112)
Glucose, Bld: 88 mg/dL (ref 70–99)
POTASSIUM: 3.7 meq/L (ref 3.5–5.3)
Sodium: 138 mEq/L (ref 135–145)

## 2014-08-01 ENCOUNTER — Telehealth: Payer: Self-pay | Admitting: Internal Medicine

## 2014-08-01 NOTE — Telephone Encounter (Signed)
Relevant patient education mailed to patient.  

## 2014-08-31 ENCOUNTER — Ambulatory Visit (INDEPENDENT_AMBULATORY_CARE_PROVIDER_SITE_OTHER): Payer: Medicare Other | Admitting: Medical

## 2014-08-31 ENCOUNTER — Telehealth: Payer: Self-pay | Admitting: Internal Medicine

## 2014-08-31 ENCOUNTER — Encounter: Payer: Self-pay | Admitting: Medical

## 2014-08-31 VITALS — BP 145/80 | HR 67 | Temp 98.1°F | Ht 69.75 in | Wt 207.6 lb

## 2014-08-31 DIAGNOSIS — L738 Other specified follicular disorders: Secondary | ICD-10-CM

## 2014-08-31 DIAGNOSIS — J438 Other emphysema: Secondary | ICD-10-CM

## 2014-08-31 DIAGNOSIS — L678 Other hair color and hair shaft abnormalities: Secondary | ICD-10-CM

## 2014-08-31 DIAGNOSIS — T7840XA Allergy, unspecified, initial encounter: Secondary | ICD-10-CM | POA: Diagnosis not present

## 2014-08-31 DIAGNOSIS — L739 Follicular disorder, unspecified: Secondary | ICD-10-CM | POA: Insufficient documentation

## 2014-08-31 MED ORDER — DOXYCYCLINE HYCLATE 100 MG PO TABS: 100.0000 mg | ORAL_TABLET | Freq: Two times a day (BID) | ORAL | Status: DC

## 2014-08-31 MED ORDER — PREDNISONE 20 MG PO TABS: ORAL_TABLET | ORAL | Status: DC

## 2014-08-31 MED ORDER — HYDROXYZINE HCL 10 MG PO TABS: 10.0000 mg | ORAL_TABLET | Freq: Three times a day (TID) | ORAL | Status: DC | PRN

## 2014-08-31 MED ORDER — DOXYCYCLINE HYCLATE 100 MG PO TABS
100.0000 mg | ORAL_TABLET | Freq: Two times a day (BID) | ORAL | Status: DC
Start: 2014-08-31 — End: 2014-08-31

## 2014-08-31 NOTE — Assessment & Plan Note (Signed)
I will give patient a tapered dose of prednisone over the next 6 days. If this could be the cause I feel he needs to stop tudorza . He describes onset of the rash coincided directly with use of the inhaler and he read this is possible in the package insert. So we'll stop this temporarily .

## 2014-08-31 NOTE — Patient Instructions (Addendum)
You may have an allergic reaction to [place]. That is unclear but while being treated can stop over next week. The prednisone over next week should help with copd and tx allergic reaction. On follow up will determine if can resume inhaler. He does have albuterol available per his report.  Allergies Allergies may happen from anything your body is sensitive to. This may be food, medicines, pollens, chemicals, and nearly anything around you in everyday life that produces allergens. An allergen is anything that causes an allergy producing substance. Heredity is often a factor in causing these problems. This means you may have some of the same allergies as your parents. Food allergies happen in all age groups. Food allergies are some of the most severe and life threatening. Some common food allergies are cow's milk, seafood, eggs, nuts, wheat, and soybeans. SYMPTOMS   Swelling around the mouth.  An itchy red rash or hives.  Vomiting or diarrhea.  Difficulty breathing. SEVERE ALLERGIC REACTIONS ARE LIFE-THREATENING. This reaction is called anaphylaxis. It can cause the mouth and throat to swell and cause difficulty with breathing and swallowing. In severe reactions only a trace amount of food (for example, peanut oil in a salad) may cause death within seconds. Seasonal allergies occur in all age groups. These are seasonal because they usually occur during the same season every year. They may be a reaction to molds, grass pollens, or tree pollens. Other causes of problems are house dust mite allergens, pet dander, and mold spores. The symptoms often consist of nasal congestion, a runny itchy nose associated with sneezing, and tearing itchy eyes. There is often an associated itching of the mouth and ears. The problems happen when you come in contact with pollens and other allergens. Allergens are the particles in the air that the body reacts to with an allergic reaction. This causes you to release allergic  antibodies. Through a chain of events, these eventually cause you to release histamine into the blood stream. Although it is meant to be protective to the body, it is this release that causes your discomfort. This is why you were given anti-histamines to feel better. If you are unable to pinpoint the offending allergen, it may be determined by skin or blood testing. Allergies cannot be cured but can be controlled with medicine. Hay fever is a collection of all or some of the seasonal allergy problems. It may often be treated with simple over-the-counter medicine such as diphenhydramine. Take medicine as directed. Do not drink alcohol or drive while taking this medicine. Check with your caregiver or package insert for child dosages. If these medicines are not effective, there are many new medicines your caregiver can prescribe. Stronger medicine such as nasal spray, eye drops, and corticosteroids may be used if the first things you try do not work well. Other treatments such as immunotherapy or desensitizing injections can be used if all else fails. Follow up with your caregiver if problems continue. These seasonal allergies are usually not life threatening. They are generally more of a nuisance that can often be handled using medicine. HOME CARE INSTRUCTIONS   If unsure what causes a reaction, keep a diary of foods eaten and symptoms that follow. Avoid foods that cause reactions.  If hives or rash are present:  Take medicine as directed.  You may use an over-the-counter antihistamine (diphenhydramine) for hives and itching as needed.  Apply cold compresses (cloths) to the skin or take baths in cool water. Avoid hot baths or showers. Heat  will make a rash and itching worse.  If you are severely allergic:  Following a treatment for a severe reaction, hospitalization is often required for closer follow-up.  Wear a medic-alert bracelet or necklace stating the allergy.  You and your family must  learn how to give adrenaline or use an anaphylaxis kit.  If you have had a severe reaction, always carry your anaphylaxis kit or EpiPen with you. Use this medicine as directed by your caregiver if a severe reaction is occurring. Failure to do so could have a fatal outcome. SEEK MEDICAL CARE IF:  You suspect a food allergy. Symptoms generally happen within 30 minutes of eating a food.  Your symptoms have not gone away within 2 days or are getting worse.  You develop new symptoms.  You want to retest yourself or your child with a food or drink you think causes an allergic reaction. Never do this if an anaphylactic reaction to that food or drink has happened before. Only do this under the care of a caregiver. SEEK IMMEDIATE MEDICAL CARE IF:   You have difficulty breathing, are wheezing, or have a tight feeling in your chest or throat.  You have a swollen mouth, or you have hives, swelling, or itching all over your body.  You have had a severe reaction that has responded to your anaphylaxis kit or an EpiPen. These reactions may return when the medicine has worn off. These reactions should be considered life threatening. MAKE SURE YOU:   Understand these instructions.  Will watch your condition.  Will get help right away if you are not doing well or get worse. Document Released: 02/18/2003 Document Revised: 03/22/2013 Document Reviewed: 07/25/2008 [facility] Patient Information 2015 [place], [place]. This information is not intended to replace advice given to you by your health care provider. Make sure you discuss any questions you have with your health care provider.   For follicultis please take doxycycline. Not he mentioned exposure to one tick.  I decided not to do testing. Doxy will treat folliculitis and cover tick borne disease(lyme and rocky in event exposed) Folliculitis  Folliculitis is redness, soreness, and swelling (inflammation) of the hair follicles. This condition can occur  anywhere on the body. People with weakened immune systems, diabetes, or obesity have a greater risk of getting folliculitis. CAUSES  Bacterial infection. This is the most common cause.  Fungal infection.  Viral infection.  Contact with certain chemicals, especially oils and tars. Long-term folliculitis can result from bacteria that live in the nostrils. The bacteria may trigger multiple outbreaks of folliculitis over time. SYMPTOMS Folliculitis most commonly occurs on the scalp, thighs, legs, back, buttocks, and areas where hair is shaved frequently. An early sign of folliculitis is a small, white or yellow, pus-filled, itchy lesion (pustule). These lesions appear on a red, inflamed follicle. They are usually less than 0.2 inches (5 mm) wide. When there is an infection of the follicle that goes deeper, it becomes a boil or furuncle. A group of closely packed boils creates a larger lesion (carbuncle). Carbuncles tend to occur in hairy, sweaty areas of the body. DIAGNOSIS  Your caregiver can usually tell what is wrong by doing a physical exam. A sample may be taken from one of the lesions and tested in a lab. This can help determine what is causing your folliculitis. TREATMENT  Treatment may include:  Applying warm compresses to the affected areas.  Taking antibiotic medicines orally or applying them to the skin.  Draining the lesions  if they contain a large amount of pus or fluid.  Laser hair removal for cases of long-lasting folliculitis. This helps to prevent regrowth of the hair. HOME CARE INSTRUCTIONS  Apply warm compresses to the affected areas as directed by your caregiver.  If antibiotics are prescribed, take them as directed. Finish them even if you start to feel better.  You may take over-the-counter medicines to relieve itching.  Do not shave irritated skin.  Follow up with your caregiver as directed. SEEK IMMEDIATE MEDICAL CARE IF:   You have increasing redness,  swelling, or pain in the affected area.  You have a fever. MAKE SURE YOU:  Understand these instructions.  Will watch your condition.  Will get help right away if you are not doing well or get worse. Document Released: 02/03/2002 Document Revised: 05/26/2012 Document Reviewed: 02/25/2012 [facility] Patient Information 2015 [place], [place]. This information is not intended to replace advice given to you by your health care provider. Make sure you discuss any questions you have with your health care provider.  Please Follow up in 1 week so we can determine if you can restart the tudorza.

## 2014-08-31 NOTE — Assessment & Plan Note (Signed)
This may be occurring presently after the allergic reaction. Or this may be his predominant problem. Did prescribe doxycycline. Prescription advisement given regarding both the doxycycline and the prednisone.

## 2014-08-31 NOTE — Progress Notes (Signed)
   Subjective:    Patient ID: Jon Baker, male    DOB: [birth year], 80 y.o.   MRN: [id number]  HPI  Pt states for 2 days he got scattered small bumps all over his body.  Started to itch with on his hands. This coincided with use use of Tudorza since Sunday. But has been on it for years before but restarted and noted rash with onset of tudorza.(No change in breathing with skin possible reaction) Pt is not on spiriva that was dc'd when tudorza started.  He states bumps were itching. He is not diabetic. No hx of ulcers gi. No swelling of lips. Pt states read on package insert possible skin reaction to Tudoraz.(Not he had been on this years ago) Then restarted.  One of areas on his ankle were blisters.  Also exposure to ticks and poison ivy.  Pulled it off his leg 1-2 weks ago     Review of Systems  Constitutional: Negative for fever, chills and fatigue.  HENT: Negative.   Respiratory: Negative for choking, chest tightness, shortness of breath and wheezing.   Cardiovascular: Negative for chest pain and palpitations.  Gastrointestinal: Negative.   Genitourinary: Negative.   Musculoskeletal: Negative.   Skin:       Rash that itches. Bumps all over.  Neurological: Negative.   Hematological: Negative.  Negative for adenopathy. Does not bruise/bleed easily.       Objective:   Physical Exam  General- No acute, distress, pleasant pt. Lungs- CTA Heart-RRR Skin- scattered raised bumps mostly conistent with follicles all over thorax and arms. Some scattered area on his ankles which he states initialy were small blisters on both calfs. Buccal mucosa normal.       Assessment & Plan:

## 2014-08-31 NOTE — Assessment & Plan Note (Signed)
Unclear etiology but for safety sake will stop the new inhaler. We'll start tapered dose of prednisone over the next 6 days. Also will send low dose prescription of hydroxyzine for the itching. When he is in for followup in one week we'll see how his rash is and determine if he may be able to restart the inhaler.(tudorza).

## 2014-08-31 NOTE — Telephone Encounter (Signed)
Caller name:allison armfield Relation to ptself Call back [phone] Pharmacy:  Reason for call: pt's daughter would like to speak with someone regarding pt's appt this morning with edward, states she has some questions pertaining to the allergic reaction to the meds that pt is taken.

## 2014-09-07 ENCOUNTER — Ambulatory Visit (INDEPENDENT_AMBULATORY_CARE_PROVIDER_SITE_OTHER): Payer: Medicare Other | Admitting: Medical

## 2014-09-07 ENCOUNTER — Encounter: Payer: Self-pay | Admitting: Medical

## 2014-09-07 VITALS — BP 117/72 | HR 60 | Temp 98.1°F | Ht 65.1 in | Wt 205.8 lb

## 2014-09-07 DIAGNOSIS — Z5189 Encounter for other specified aftercare: Secondary | ICD-10-CM

## 2014-09-07 DIAGNOSIS — L739 Follicular disorder, unspecified: Secondary | ICD-10-CM

## 2014-09-07 DIAGNOSIS — L738 Other specified follicular disorders: Secondary | ICD-10-CM | POA: Diagnosis not present

## 2014-09-07 DIAGNOSIS — J438 Other emphysema: Secondary | ICD-10-CM | POA: Diagnosis not present

## 2014-09-07 DIAGNOSIS — T7840XD Allergy, unspecified, subsequent encounter: Secondary | ICD-10-CM

## 2014-09-07 DIAGNOSIS — T7840XA Allergy, unspecified, initial encounter: Secondary | ICD-10-CM | POA: Diagnosis not present

## 2014-09-07 DIAGNOSIS — L678 Other hair color and hair shaft abnormalities: Secondary | ICD-10-CM | POA: Diagnosis not present

## 2014-09-07 MED ORDER — MUPIROCIN 2 % EX OINT: 1.0000 "application " | TOPICAL_OINTMENT | Freq: Two times a day (BID) | CUTANEOUS | Status: DC

## 2014-09-07 MED ORDER — AMMONIUM LACTATE 12 % EX LOTN: 1.0000 "application " | TOPICAL_LOTION | CUTANEOUS | Status: DC | PRN

## 2014-09-07 NOTE — Assessment & Plan Note (Signed)
Etiology was never determined. The prednisone did seem to help. He has some outdoor exposures and this might have been the cause. So at this point he is restarting his tudorza and if  he does get subsequent rash he will let us know. At that point would put med on his allergy list but presently I am skeptical that this  was the cause of the rash

## 2014-09-07 NOTE — Assessment & Plan Note (Signed)
His folliculitis is improved about 98% now. He only has residual scant scabbing around his ankles. He is finished with your antibiotic. At this point I only prescribed him lac-hydrin moisturizer to use twice a day and prescription of mupirocin to use twice a day as well. Advised patient that these residuals region should clear up in about 7-10 days. If they worsen or persist please let us know.

## 2014-09-07 NOTE — Assessment & Plan Note (Signed)
His breathing is much improved. His O2 sat last time was 91% and today when I rechecked it was 95%. I advised him that he can restart the tudorza and albuterol. If you were to get her rash began after starting the tudora stop it again and notify us. At that point would put it on his allergy list. But at this time I am doubtful that this was the cause of his rash.

## 2014-09-07 NOTE — Patient Instructions (Addendum)
Your breathing/copd is better. I think you can restart your prior regimen of inhalers of albuterol and tudorza. There was some concern of tudorza causing rash.(I doubt this was the cause). But if rash restarts again stop tudorza and notify us.  Your folliculitis is 98% better. Only residual around ankles. Will give lac-hydrin and mupirocin. If areas on ankles persist past 7 days let us know.  Follow up as regularly scheduled with pcp or as needed.

## 2014-09-07 NOTE — Progress Notes (Signed)
Subjective:    Patient ID: Jon Baker, male    DOB: 1932-09-11, 80 y.o.   MRN: [id number]  HPI  Pt states breathing well and better. No fever, no chills and no cough. His o2 sat is better.  His skin rash that was diffuse all over. Now only minimal around ankles. Cause of rash was undetermined.   Pt questions whether or not tudorza caused skin reaction due to fact he had been on it before.  He and wife presently think that was not the case and remembers outdoor exposure to poison ivy.  Past Medical History  Diagnosis Date  . DJD (degenerative joint disease)   . Chest pain     2009, saw  cardiology , decline a  stress test  . HTN (hypertension)   . Claudication     2009, saw  cardiology, declined ABIs  . COPD (chronic obstructive pulmonary disease)     states he has been told by MD he has copd  . Shortness of breath   . Pancreatitis, gallstone 05/2013    History   Social History  . Marital Status: Married    Spouse Name: N/A    Number of Children: N/A  . Years of Education: N/A   Occupational History  . Not on file.   Social History Main Topics  . Smoking status: Former Smoker    Quit date: 12/10/1987  . Smokeless tobacco: Never Used  . Alcohol Use: No  . Drug Use: No  . Sexual Activity: Not on file   Other Topics Concern  . Not on file   Social History Narrative  . No narrative on file    Past Surgical History  Procedure Laterality Date  . Myleogram  1979  . Total knee arthroplasty Right 02-2011    Dr Despina Hick  . Glanglian cyst removal Left 1970's  . Cholecystectomy N/A 06/06/2013    Procedure: LAPAROSCOPIC CHOLECYSTECTOMY WITH INTRAOPERATIVE CHOLANGIOGRAM;  Surgeon: Liz Malady, MD;  Location: [facility] OR;  Service: General;  Laterality: N/A;    Family History  Problem Relation Age of Onset  . Coronary artery disease Mother     pancreatic cancer  . Coronary artery disease Father     several MI'S  . Diabetes Neg Hx   . Cancer Sister     breast  cancer at age 25    No Known Allergies  Current Outpatient Prescriptions on File Prior to Visit  Medication Sig Dispense Refill  . Aclidinium Bromide (TUDORZA PRESSAIR) 400 MCG/ACT AEPB Inhale 1 Inhaler into the lungs 2 (two) times daily.  1 each  5  . albuterol (VENTOLIN HFA) 108 (90 BASE) MCG/ACT inhaler Inhale 2 puffs into the lungs every 6 (six) hours as needed for wheezing or shortness of breath.  1 Inhaler  1  . losartan-hydrochlorothiazide (HYZAAR) 50-12.5 MG per tablet Take 1 tablet by mouth daily.  30 tablet  5  . doxycycline (VIBRA-TABS) 100 MG tablet Take 1 tablet (100 mg total) by mouth 2 (two) times daily.  14 tablet  0  . hydrOXYzine (ATARAX/VISTARIL) 10 MG tablet Take 1 tablet (10 mg total) by mouth 3 (three) times daily as needed.  15 tablet  0  . predniSONE (DELTASONE) 20 MG tablet 1 tab po tid day 1 and day 2, then 1 tab po bid day 3 and day 4, then 1 tab po day 5 and day 6.  12 tablet  0   No current facility-administered medications on file prior to visit.  BP 117/72  Pulse 60  Temp(Src) 98.1 F (36.7 C) (Oral)  Ht 5' 5.1" (1.654 m)  Wt 205 lb 12.8 oz (93.35 kg)  BMI 34.12 kg/m2  SpO2 95%        Review of Systems  Constitutional: Negative for fever, chills and fatigue.  HENT: Negative.   Respiratory: Negative for cough, choking, chest tightness, shortness of breath and wheezing.   Cardiovascular: Negative for chest pain and palpitations.  Genitourinary: Negative.   Musculoskeletal: Negative for back pain.  Skin:       98% cleared. Residual scabs around his ankles.       Objective:   Physical Exam  Constitutional: He is oriented to person, place, and time. He appears well-developed and well-nourished. No distress.  HENT:  Head: Atraumatic.  Eyes: Conjunctivae and EOM are normal. Pupils are equal, round, and reactive to light.  Neck: Normal range of motion. Neck supple. No JVD present. No tracheal deviation present. No thyromegaly present.    Cardiovascular: Normal rate, regular rhythm and normal heart sounds.   Pulmonary/Chest: Effort normal and breath sounds normal. No stridor. No respiratory distress. He has no wheezes. He has no rales. He exhibits no tenderness.  Abdominal: Soft. Bowel sounds are normal. He exhibits no distension and no mass. There is no tenderness. There is no rebound and no guarding.  Lymphadenopathy:    He has no cervical adenopathy.  Neurological: He is alert and oriented to person, place, and time.  Skin: He is not diaphoretic.  Only scant scabs around ankles. No dc or redness. His thorax which was covered with diffuse folliculitis front and back is now clear.  Psychiatric: He has a normal mood and affect. His behavior is normal. Judgment and thought content normal.           Assessment & Plan:

## 2014-09-09 ENCOUNTER — Telehealth: Payer: Self-pay | Admitting: *Deleted

## 2014-09-09 NOTE — Telephone Encounter (Signed)
PA approved effective 06/11/2014 through 09/09/2015. Approval letter sent for scanning. JG//CMA

## 2014-09-09 NOTE — Telephone Encounter (Signed)
PA for hydroxyzine initiated on [email or web address]. Awaiting response. JG//CMA

## 2014-12-09 DIAGNOSIS — E785 Hyperlipidemia, unspecified: Secondary | ICD-10-CM

## 2014-12-09 HISTORY — DX: Hyperlipidemia, unspecified: E78.5

## 2015-01-09 ENCOUNTER — Telehealth: Payer: Self-pay

## 2015-01-09 MED ORDER — UMECLIDINIUM BROMIDE 62.5 MCG/INH IN AEPB: 1.0000 | INHALATION_SPRAY | Freq: Every day | RESPIRATORY_TRACT | Status: DC

## 2015-01-09 NOTE — Telephone Encounter (Signed)
Received letter from [employer] informing of TUDORZA PRES AER 400/ACT formulary change. Per Dr. Drue Novel okay to change to INCRUSE ELLIPTA 6.25MCG 1 puff everyday. Letter and formulary list sent for scanning into chart.

## 2015-02-24 ENCOUNTER — Other Ambulatory Visit: Payer: Self-pay | Admitting: Internal Medicine

## 2015-03-27 ENCOUNTER — Telehealth: Payer: Self-pay | Admitting: Internal Medicine

## 2015-03-27 NOTE — Telephone Encounter (Signed)
Ok with me, thx 

## 2015-03-27 NOTE — Telephone Encounter (Signed)
Patient is requesting to transfer from [facility] to [name].  Please advise.

## 2015-03-28 NOTE — Telephone Encounter (Signed)
Ok Thx 

## 2015-03-28 NOTE — Telephone Encounter (Signed)
Left vm for patient to call back to make appointment °

## 2015-03-31 ENCOUNTER — Other Ambulatory Visit (INDEPENDENT_AMBULATORY_CARE_PROVIDER_SITE_OTHER): Payer: Medicare Other

## 2015-03-31 ENCOUNTER — Ambulatory Visit (INDEPENDENT_AMBULATORY_CARE_PROVIDER_SITE_OTHER): Payer: Medicare Other | Admitting: Internal Medicine

## 2015-03-31 ENCOUNTER — Encounter: Payer: Self-pay | Admitting: Internal Medicine

## 2015-03-31 VITALS — BP 134/78 | HR 63 | Ht 70.0 in | Wt 207.0 lb

## 2015-03-31 DIAGNOSIS — I10 Essential (primary) hypertension: Secondary | ICD-10-CM

## 2015-03-31 DIAGNOSIS — E782 Mixed hyperlipidemia: Secondary | ICD-10-CM

## 2015-03-31 DIAGNOSIS — N320 Bladder-neck obstruction: Secondary | ICD-10-CM

## 2015-03-31 DIAGNOSIS — R202 Paresthesia of skin: Secondary | ICD-10-CM

## 2015-03-31 DIAGNOSIS — M542 Cervicalgia: Secondary | ICD-10-CM | POA: Diagnosis not present

## 2015-03-31 LAB — LIPID PANEL
CHOL/HDL RATIO: 6
Cholesterol: 176 mg/dL (ref 0–200)
HDL: 29.4 mg/dL — AB (ref >39.00)
LDL Cholesterol: 115 mg/dL — ABNORMAL HIGH (ref 0–99)
NONHDL: 146.6
Triglycerides: 158 mg/dL — ABNORMAL HIGH (ref 0.0–149.0)
VLDL: 31.6 mg/dL (ref 0.0–40.0)

## 2015-03-31 LAB — HEPATIC FUNCTION PANEL
ALT: 30 U/L (ref 0–53)
AST: 28 U/L (ref 0–37)
Albumin: 4 g/dL (ref 3.5–5.2)
Alkaline Phosphatase: 86 U/L (ref 39–117)
BILIRUBIN TOTAL: 0.8 mg/dL (ref 0.2–1.2)
Bilirubin, Direct: 0.1 mg/dL (ref 0.0–0.3)
TOTAL PROTEIN: 7 g/dL (ref 6.0–8.3)

## 2015-03-31 LAB — CBC WITH DIFFERENTIAL/PLATELET
BASOS PCT: 0.8 % (ref 0.0–3.0)
Basophils Absolute: 0.1 10*3/uL (ref 0.0–0.1)
Eosinophils Absolute: 0.3 10*3/uL (ref 0.0–0.7)
Eosinophils Relative: 4.2 % (ref 0.0–5.0)
HCT: 45.7 % (ref 39.0–52.0)
HEMOGLOBIN: 15.8 g/dL (ref 13.0–17.0)
LYMPHS PCT: 24.5 % (ref 12.0–46.0)
Lymphs Abs: 1.9 10*3/uL (ref 0.7–4.0)
MCHC: 34.6 g/dL (ref 30.0–36.0)
MCV: 91.7 fl (ref 78.0–100.0)
MONOS PCT: 11.5 % (ref 3.0–12.0)
Monocytes Absolute: 0.9 10*3/uL (ref 0.1–1.0)
NEUTROS ABS: 4.6 10*3/uL (ref 1.4–7.7)
Neutrophils Relative %: 59 % (ref 43.0–77.0)
Platelets: 302 10*3/uL (ref 150.0–400.0)
RBC: 4.98 Mil/uL (ref 4.22–5.81)
RDW: 13.4 % (ref 11.5–15.5)
WBC: 7.9 10*3/uL (ref 4.0–10.5)

## 2015-03-31 LAB — URINALYSIS
Bilirubin Urine: NEGATIVE
HGB URINE DIPSTICK: NEGATIVE
Ketones, ur: NEGATIVE
Leukocytes, UA: NEGATIVE
NITRITE: NEGATIVE
Specific Gravity, Urine: 1.02 (ref 1.000–1.030)
Total Protein, Urine: NEGATIVE
UROBILINOGEN UA: 0.2 (ref 0.0–1.0)
Urine Glucose: NEGATIVE
pH: 6 (ref 5.0–8.0)

## 2015-03-31 LAB — TSH: TSH: 2.04 u[IU]/mL (ref 0.35–4.50)

## 2015-03-31 LAB — BASIC METABOLIC PANEL
BUN: 24 mg/dL — ABNORMAL HIGH (ref 6–23)
CO2: 34 meq/L — AB (ref 19–32)
Calcium: 9.7 mg/dL (ref 8.4–10.5)
Chloride: 100 mEq/L (ref 96–112)
Creatinine, Ser: 1.14 mg/dL (ref 0.40–1.50)
GFR: 65.54 mL/min (ref >60.00)
Glucose, Bld: 96 mg/dL (ref 70–99)
Potassium: 3.9 mEq/L (ref 3.5–5.1)
Sodium: 139 mEq/L (ref 135–145)

## 2015-03-31 LAB — SEDIMENTATION RATE: SED RATE: 14 mm/h (ref 0–22)

## 2015-03-31 LAB — PSA: PSA: 4.51 ng/mL — ABNORMAL HIGH (ref 0.10–4.00)

## 2015-03-31 LAB — VITAMIN B12: VITAMIN B 12: 459 pg/mL (ref 211–911)

## 2015-03-31 MED ORDER — VITAMIN D 1000 UNITS PO TABS: 1000.0000 [IU] | ORAL_TABLET | Freq: Every day | ORAL | Status: DC

## 2015-03-31 MED ORDER — LOSARTAN POTASSIUM-HCTZ 50-12.5 MG PO TABS: 1.0000 | ORAL_TABLET | Freq: Every day | ORAL | Status: DC

## 2015-03-31 MED ORDER — ASPIRIN EC 81 MG PO TBEC: 81.0000 mg | DELAYED_RELEASE_TABLET | Freq: Every day | ORAL | Status: DC

## 2015-03-31 NOTE — Progress Notes (Signed)
Subjective:     HPI  New pt ( changing from Dr Drue Novel)  F/u HTN C/o neck pain L>R C/o BPH sx's C/o "walking on sponge" x long time  Wt Readings from Last 3 Encounters:  03/31/15 207 lb (93.895 kg)  09/07/14 205 lb 12.8 oz (93.35 kg)  08/31/14 207 lb 9.6 oz (94.167 kg)   BP Readings from Last 3 Encounters:  03/31/15 134/78  09/07/14 117/72  08/31/14 145/80      Review of Systems  Constitutional: Negative for appetite change, fatigue and unexpected weight change.  HENT: Negative for congestion, ear pain, nosebleeds, sneezing, sore throat and trouble swallowing.   Eyes: Negative for itching and visual disturbance.  Respiratory: Negative for cough.   Cardiovascular: Negative for chest pain, palpitations and leg swelling.  Gastrointestinal: Negative for nausea, diarrhea, blood in stool and abdominal distention.  Genitourinary: Positive for urgency and difficulty urinating. Negative for frequency and hematuria.  Musculoskeletal: Negative for back pain, joint swelling, gait problem and neck pain.  Skin: Negative for rash.  Neurological: Positive for numbness. Negative for dizziness, tremors, speech difficulty and weakness.  Psychiatric/Behavioral: Negative for sleep disturbance, dysphoric mood and agitation. The patient is not nervous/anxious.        Objective:   Physical Exam  Constitutional: He is oriented to person, place, and time. He appears well-developed. No distress.  NAD  HENT:  Mouth/Throat: Oropharynx is clear and moist.  Eyes: Conjunctivae are normal. Pupils are equal, round, and reactive to light.  Neck: Normal range of motion. No JVD present. No thyromegaly present.  Cardiovascular: Normal rate, regular rhythm, normal heart sounds and intact distal pulses.  Exam reveals no gallop and no friction rub.   No murmur heard. Pulmonary/Chest: Effort normal and breath sounds normal. No respiratory distress. He has no wheezes. He has no rales. He exhibits no tenderness.   Abdominal: Soft. Bowel sounds are normal. He exhibits no distension and no mass. There is no tenderness. There is no rebound and no guarding.  Musculoskeletal: Normal range of motion. He exhibits no edema or tenderness.  Lymphadenopathy:    He has no cervical adenopathy.  Neurological: He is alert and oriented to person, place, and time. He has normal reflexes. No cranial nerve deficit. He exhibits normal muscle tone. He displays a negative Romberg sign. Coordination and gait normal.  Skin: Skin is warm and dry. No rash noted.  Psychiatric: He has a normal mood and affect. His behavior is normal. Judgment and thought content normal.   Lab Results  Component Value Date   WBC 7.9 03/31/2015   HGB 15.8 03/31/2015   HCT 45.7 03/31/2015   PLT 302.0 03/31/2015   GLUCOSE 96 03/31/2015   CHOL 176 03/31/2015   TRIG 158.0* 03/31/2015   HDL 29.40* 03/31/2015   LDLCALC 115* 03/31/2015   ALT 30 03/31/2015   AST 28 03/31/2015   NA 139 03/31/2015   K 3.9 03/31/2015   CL 100 03/31/2015   CREATININE 1.14 03/31/2015   BUN 24* 03/31/2015   CO2 34* 03/31/2015   TSH 2.04 03/31/2015   PSA 4.51* 03/31/2015   INR 1.05 06/04/2013       Assessment:         Plan:

## 2015-03-31 NOTE — Progress Notes (Signed)
Pre visit review using our clinic review tool, if applicable. No additional management support is needed unless otherwise documented below in the visit note. 

## 2015-03-31 NOTE — Assessment & Plan Note (Signed)
B LEs Labs

## 2015-03-31 NOTE — Assessment & Plan Note (Signed)
X ray, shots offered

## 2015-03-31 NOTE — Patient Instructions (Addendum)
A contour pillow Saw [place]metto for prostate

## 2015-04-02 NOTE — Assessment & Plan Note (Signed)
On Hyzaar 

## 2015-04-02 NOTE — Assessment & Plan Note (Signed)
Pt declined statins Low fat diet

## 2015-04-20 ENCOUNTER — Other Ambulatory Visit: Payer: Self-pay | Admitting: Internal Medicine

## 2015-05-20 ENCOUNTER — Emergency Department (HOSPITAL_COMMUNITY): Payer: Medicare Other

## 2015-05-20 ENCOUNTER — Encounter (HOSPITAL_COMMUNITY)
Admission: EM | Disposition: A | Discharge status: Home / Self Care | Payer: Medicare Other | Source: Home / Self Care | Attending: Cardiovascular Disease

## 2015-05-20 ENCOUNTER — Inpatient Hospital Stay (HOSPITAL_COMMUNITY)
Admission: EM | Admit: 2015-05-20 | Discharge: 2015-05-23 | DRG: 247 | Disposition: A | Discharge status: Home / Self Care | Payer: Medicare Other | Attending: Cardiovascular Disease | Admitting: Cardiovascular Disease

## 2015-05-20 ENCOUNTER — Encounter (HOSPITAL_COMMUNITY): Payer: Self-pay | Admitting: Emergency Medicine

## 2015-05-20 DIAGNOSIS — Z8249 Family history of ischemic heart disease and other diseases of the circulatory system: Secondary | ICD-10-CM

## 2015-05-20 DIAGNOSIS — Z79899 Other long term (current) drug therapy: Secondary | ICD-10-CM

## 2015-05-20 DIAGNOSIS — D72829 Elevated white blood cell count, unspecified: Secondary | ICD-10-CM | POA: Diagnosis present

## 2015-05-20 DIAGNOSIS — E876 Hypokalemia: Secondary | ICD-10-CM | POA: Diagnosis present

## 2015-05-20 DIAGNOSIS — R0789 Other chest pain: Secondary | ICD-10-CM | POA: Diagnosis not present

## 2015-05-20 DIAGNOSIS — Z87891 Personal history of nicotine dependence: Secondary | ICD-10-CM

## 2015-05-20 DIAGNOSIS — Z7982 Long term (current) use of aspirin: Secondary | ICD-10-CM | POA: Diagnosis not present

## 2015-05-20 DIAGNOSIS — I255 Ischemic cardiomyopathy: Secondary | ICD-10-CM | POA: Diagnosis present

## 2015-05-20 DIAGNOSIS — J449 Chronic obstructive pulmonary disease, unspecified: Secondary | ICD-10-CM | POA: Diagnosis present

## 2015-05-20 DIAGNOSIS — I2109 ST elevation (STEMI) myocardial infarction involving other coronary artery of anterior wall: Secondary | ICD-10-CM | POA: Diagnosis not present

## 2015-05-20 DIAGNOSIS — E785 Hyperlipidemia, unspecified: Secondary | ICD-10-CM | POA: Diagnosis present

## 2015-05-20 DIAGNOSIS — R739 Hyperglycemia, unspecified: Secondary | ICD-10-CM | POA: Diagnosis present

## 2015-05-20 DIAGNOSIS — I10 Essential (primary) hypertension: Secondary | ICD-10-CM | POA: Diagnosis not present

## 2015-05-20 DIAGNOSIS — I2510 Atherosclerotic heart disease of native coronary artery without angina pectoris: Secondary | ICD-10-CM | POA: Diagnosis not present

## 2015-05-20 DIAGNOSIS — I2102 ST elevation (STEMI) myocardial infarction involving left anterior descending coronary artery: Secondary | ICD-10-CM | POA: Diagnosis not present

## 2015-05-20 DIAGNOSIS — I213 ST elevation (STEMI) myocardial infarction of unspecified site: Secondary | ICD-10-CM | POA: Diagnosis present

## 2015-05-20 DIAGNOSIS — Z96651 Presence of right artificial knee joint: Secondary | ICD-10-CM | POA: Diagnosis present

## 2015-05-20 DIAGNOSIS — R079 Chest pain, unspecified: Secondary | ICD-10-CM | POA: Diagnosis not present

## 2015-05-20 DIAGNOSIS — I209 Angina pectoris, unspecified: Secondary | ICD-10-CM

## 2015-05-20 HISTORY — PX: CARDIAC CATHETERIZATION: SHX172

## 2015-05-20 HISTORY — DX: ST elevation (STEMI) myocardial infarction of unspecified site: I21.3

## 2015-05-20 HISTORY — DX: Ischemic cardiomyopathy: I25.5

## 2015-05-20 LAB — CREATININE, SERUM
Creatinine, Ser: 0.99 mg/dL (ref 0.61–1.24)
GFR calc Af Amer: >60 mL/min (ref >60)

## 2015-05-20 LAB — COMPREHENSIVE METABOLIC PANEL
ALBUMIN: 3.8 g/dL (ref 3.5–5.0)
ALT: 34 U/L (ref 17–63)
AST: 41 U/L (ref 15–41)
Alkaline Phosphatase: 74 U/L (ref 38–126)
Anion gap: 11 (ref 5–15)
BUN: 18 mg/dL (ref 6–20)
CO2: 26 mmol/L (ref 22–32)
CREATININE: 1.08 mg/dL (ref 0.61–1.24)
Calcium: 9 mg/dL (ref 8.9–10.3)
Chloride: 100 mmol/L — ABNORMAL LOW (ref 101–111)
GFR calc non Af Amer: >60 mL/min (ref >60)
Glucose, Bld: 131 mg/dL — ABNORMAL HIGH (ref 65–99)
POTASSIUM: 3.2 mmol/L — AB (ref 3.5–5.1)
Sodium: 137 mmol/L (ref 135–145)
TOTAL PROTEIN: 6.6 g/dL (ref 6.5–8.1)
Total Bilirubin: 1.2 mg/dL (ref 0.3–1.2)

## 2015-05-20 LAB — BRAIN NATRIURETIC PEPTIDE: B Natriuretic Peptide: 317.1 pg/mL — ABNORMAL HIGH (ref 0.0–100.0)

## 2015-05-20 LAB — APTT: APTT: 31 s (ref 24–37)

## 2015-05-20 LAB — I-STAT CHEM 8, ED
BUN: 25 mg/dL — ABNORMAL HIGH (ref 6–20)
CREATININE: 1 mg/dL (ref 0.61–1.24)
Calcium, Ion: 1.1 mmol/L — ABNORMAL LOW (ref 1.13–1.30)
Chloride: 99 mmol/L — ABNORMAL LOW (ref 101–111)
GLUCOSE: 129 mg/dL — AB (ref 65–99)
HCT: 49 % (ref 39.0–52.0)
Hemoglobin: 16.7 g/dL (ref 13.0–17.0)
POTASSIUM: 3.4 mmol/L — AB (ref 3.5–5.1)
Sodium: 139 mmol/L (ref 135–145)
TCO2: 25 mmol/L (ref 0–100)

## 2015-05-20 LAB — POCT ACTIVATED CLOTTING TIME: Activated Clotting Time: 472 seconds

## 2015-05-20 LAB — CBC
HCT: 44 % (ref 39.0–52.0)
HCT: 43.6 % (ref 39.0–52.0)
HEMOGLOBIN: 15.6 g/dL (ref 13.0–17.0)
Hemoglobin: 16 g/dL (ref 13.0–17.0)
MCH: 32.7 pg (ref 26.0–34.0)
MCH: 32 pg (ref 26.0–34.0)
MCHC: 36.4 g/dL — ABNORMAL HIGH (ref 30.0–36.0)
MCHC: 35.8 g/dL (ref 30.0–36.0)
MCV: 89.8 fL (ref 78.0–100.0)
MCV: 89.3 fL (ref 78.0–100.0)
Platelets: 257 10*3/uL (ref 150–400)
Platelets: 259 10*3/uL (ref 150–400)
RBC: 4.9 MIL/uL (ref 4.22–5.81)
RBC: 4.88 MIL/uL (ref 4.22–5.81)
RDW: 12.5 % (ref 11.5–15.5)
RDW: 12.4 % (ref 11.5–15.5)
WBC: 10.3 10*3/uL (ref 4.0–10.5)
WBC: 8.4 10*3/uL (ref 4.0–10.5)

## 2015-05-20 LAB — TROPONIN I
TROPONIN I: 45.94 ng/mL — AB (ref <0.031)
Troponin I: >65 ng/mL (ref <0.031)

## 2015-05-20 LAB — I-STAT TROPONIN, ED: TROPONIN I, POC: 0.18 ng/mL — AB (ref 0.00–0.08)

## 2015-05-20 LAB — PROTIME-INR
INR: 1.1 (ref 0.00–1.49)
Prothrombin Time: 14.4 seconds (ref 11.6–15.2)

## 2015-05-20 LAB — MRSA PCR SCREENING: MRSA BY PCR: NEGATIVE

## 2015-05-20 SURGERY — LEFT HEART CATH AND CORONARY ANGIOGRAPHY
Anesthesia: LOCAL

## 2015-05-20 MED ORDER — TIROFIBAN (AGGRASTAT) BOLUS VIA INFUSION
25.0000 ug/kg | Freq: Once | INTRAVENOUS | Status: DC
  Filled 2015-05-20: qty 48

## 2015-05-20 MED ORDER — VERAPAMIL HCL 2.5 MG/ML IV SOLN
INTRAVENOUS | Status: AC
  Filled 2015-05-20: qty 2

## 2015-05-20 MED ORDER — VERAPAMIL HCL 2.5 MG/ML IV SOLN
INTRAVENOUS | Status: DC | PRN
  Administered 2015-05-20: 15:00:00 via INTRA_ARTERIAL

## 2015-05-20 MED ORDER — NITROGLYCERIN 0.4 MG SL SUBL: 0.4000 mg | SUBLINGUAL_TABLET | SUBLINGUAL | Status: DC | PRN

## 2015-05-20 MED ORDER — TICAGRELOR 90 MG PO TABS
ORAL_TABLET | ORAL | Status: AC
  Filled 2015-05-20: qty 1

## 2015-05-20 MED ORDER — IOHEXOL 350 MG/ML SOLN
INTRAVENOUS | Status: DC | PRN
  Administered 2015-05-20: 135 mL via INTRAVENOUS

## 2015-05-20 MED ORDER — ASPIRIN 81 MG PO CHEW: 324.0000 mg | CHEWABLE_TABLET | Freq: Once | ORAL | Status: DC

## 2015-05-20 MED ORDER — MORPHINE SULFATE 2 MG/ML IJ SOLN: 2.0000 mg | INTRAMUSCULAR | Status: DC | PRN

## 2015-05-20 MED ORDER — SODIUM CHLORIDE 0.9 % IV SOLN: 250.0000 mL | INTRAVENOUS | Status: DC | PRN

## 2015-05-20 MED ORDER — SODIUM CHLORIDE 0.9 % IJ SOLN: 3.0000 mL | Freq: Two times a day (BID) | INTRAMUSCULAR | Status: DC

## 2015-05-20 MED ORDER — HEPARIN SODIUM (PORCINE) 5000 UNIT/ML IJ SOLN: 60.0000 [IU]/kg | INTRAMUSCULAR | Status: DC

## 2015-05-20 MED ORDER — ALPRAZOLAM 0.25 MG PO TABS
0.2500 mg | ORAL_TABLET | Freq: Two times a day (BID) | ORAL | Status: DC | PRN
  Administered 2015-05-21 (×2): 0.25 mg via ORAL
  Filled 2015-05-20 (×3): qty 1

## 2015-05-20 MED ORDER — MIDAZOLAM HCL 2 MG/2ML IJ SOLN
INTRAMUSCULAR | Status: AC
  Filled 2015-05-20: qty 2

## 2015-05-20 MED ORDER — TICAGRELOR 90 MG PO TABS
90.0000 mg | ORAL_TABLET | Freq: Two times a day (BID) | ORAL | Status: DC
  Administered 2015-05-20 – 2015-05-23 (×6): 90 mg via ORAL
  Filled 2015-05-20 (×7): qty 1

## 2015-05-20 MED ORDER — BIVALIRUDIN 250 MG IV SOLR
INTRAVENOUS | Status: AC
Start: 2015-05-20 — End: 2015-05-20
  Filled 2015-05-20: qty 250

## 2015-05-20 MED ORDER — NITROGLYCERIN 1 MG/10 ML FOR IR/CATH LAB
INTRA_ARTERIAL | Status: DC | PRN
  Administered 2015-05-20 (×2): 150 ug

## 2015-05-20 MED ORDER — ZOLPIDEM TARTRATE 5 MG PO TABS
5.0000 mg | ORAL_TABLET | Freq: Every evening | ORAL | Status: DC | PRN
  Administered 2015-05-21 – 2015-05-22 (×2): 5 mg via ORAL
  Filled 2015-05-20 (×2): qty 1

## 2015-05-20 MED ORDER — SODIUM CHLORIDE 0.9 % IV SOLN
250.0000 mg | INTRAVENOUS | Status: DC | PRN
  Administered 2015-05-20: 1.75 mg/kg/h via INTRAVENOUS

## 2015-05-20 MED ORDER — BIVALIRUDIN BOLUS VIA INFUSION - CUPID
INTRAVENOUS | Status: DC | PRN
  Administered 2015-05-20: 71.325 mg via INTRAVENOUS

## 2015-05-20 MED ORDER — TICAGRELOR 90 MG PO TABS
180.0000 mg | ORAL_TABLET | Freq: Once | ORAL | Status: DC
  Filled 2015-05-20: qty 2

## 2015-05-20 MED ORDER — MIDAZOLAM HCL 2 MG/2ML IJ SOLN
INTRAMUSCULAR | Status: DC | PRN
  Administered 2015-05-20: 1 mg via INTRAVENOUS

## 2015-05-20 MED ORDER — NITROGLYCERIN 1 MG/10 ML FOR IR/CATH LAB
INTRA_ARTERIAL | Status: AC
  Filled 2015-05-20: qty 10

## 2015-05-20 MED ORDER — HEPARIN SODIUM (PORCINE) 5000 UNIT/ML IJ SOLN
5000.0000 [IU] | Freq: Three times a day (TID) | INTRAMUSCULAR | Status: DC
  Administered 2015-05-20 – 2015-05-23 (×7): 5000 [IU] via SUBCUTANEOUS
  Filled 2015-05-20 (×11): qty 1

## 2015-05-20 MED ORDER — SODIUM CHLORIDE 0.9 % WEIGHT BASED INFUSION
1.0000 mL/kg/h | INTRAVENOUS | Status: AC
  Administered 2015-05-20: 1 mL/kg/h via INTRAVENOUS

## 2015-05-20 MED ORDER — ASPIRIN 81 MG PO CHEW
324.0000 mg | CHEWABLE_TABLET | Freq: Once | ORAL | Status: AC
  Administered 2015-05-20: 324 mg via ORAL
  Filled 2015-05-20: qty 4

## 2015-05-20 MED ORDER — SODIUM CHLORIDE 0.9 % IJ SOLN: 3.0000 mL | INTRAMUSCULAR | Status: DC | PRN

## 2015-05-20 MED ORDER — ATORVASTATIN CALCIUM 80 MG PO TABS
80.0000 mg | ORAL_TABLET | Freq: Every day | ORAL | Status: DC
  Administered 2015-05-22: 80 mg via ORAL
  Filled 2015-05-20 (×5): qty 1

## 2015-05-20 MED ORDER — ONDANSETRON HCL 4 MG/2ML IJ SOLN: 4.0000 mg | Freq: Four times a day (QID) | INTRAMUSCULAR | Status: DC | PRN

## 2015-05-20 MED ORDER — SODIUM CHLORIDE 0.9 % IJ SOLN
3.0000 mL | Freq: Two times a day (BID) | INTRAMUSCULAR | Status: DC
  Administered 2015-05-20 – 2015-05-21 (×3): 3 mL via INTRAVENOUS

## 2015-05-20 MED ORDER — METOPROLOL TARTRATE 12.5 MG HALF TABLET
12.5000 mg | ORAL_TABLET | Freq: Two times a day (BID) | ORAL | Status: DC
  Administered 2015-05-20 – 2015-05-23 (×6): 12.5 mg via ORAL
  Filled 2015-05-20 (×9): qty 1

## 2015-05-20 MED ORDER — LIDOCAINE HCL (PF) 1 % IJ SOLN
INTRAMUSCULAR | Status: AC
  Filled 2015-05-20: qty 30

## 2015-05-20 MED ORDER — SODIUM CHLORIDE 0.9 % IV SOLN: INTRAVENOUS | Status: DC

## 2015-05-20 MED ORDER — ASPIRIN EC 81 MG PO TBEC
81.0000 mg | DELAYED_RELEASE_TABLET | Freq: Every day | ORAL | Status: DC
  Administered 2015-05-21 – 2015-05-23 (×3): 81 mg via ORAL
  Filled 2015-05-20 (×4): qty 1

## 2015-05-20 MED ORDER — HEPARIN (PORCINE) IN NACL 100-0.45 UNIT/ML-% IJ SOLN
1300.0000 [IU]/h | INTRAMUSCULAR | Status: DC
  Filled 2015-05-20: qty 250

## 2015-05-20 MED ORDER — FENTANYL CITRATE (PF) 100 MCG/2ML IJ SOLN
INTRAMUSCULAR | Status: AC
  Filled 2015-05-20: qty 2

## 2015-05-20 MED ORDER — HEPARIN SODIUM (PORCINE) 1000 UNIT/ML IJ SOLN
INTRAMUSCULAR | Status: AC
  Filled 2015-05-20: qty 1

## 2015-05-20 MED ORDER — FENTANYL CITRATE (PF) 100 MCG/2ML IJ SOLN
INTRAMUSCULAR | Status: DC | PRN
  Administered 2015-05-20: 25 ug via INTRAVENOUS

## 2015-05-20 MED ORDER — ACETAMINOPHEN 325 MG PO TABS: 650.0000 mg | ORAL_TABLET | ORAL | Status: DC | PRN

## 2015-05-20 MED ORDER — TIROFIBAN HCL IV 5 MG/100ML
INTRAVENOUS | Status: AC
  Filled 2015-05-20: qty 100

## 2015-05-20 MED ORDER — HEPARIN SODIUM (PORCINE) 5000 UNIT/ML IJ SOLN
4000.0000 [IU] | INTRAMUSCULAR | Status: AC
  Administered 2015-05-20: 4000 [IU] via INTRAVENOUS
  Filled 2015-05-20: qty 1

## 2015-05-20 MED ORDER — HEPARIN (PORCINE) IN NACL 2-0.9 UNIT/ML-% IJ SOLN
INTRAMUSCULAR | Status: AC
  Filled 2015-05-20: qty 1000

## 2015-05-20 MED ORDER — HEPARIN BOLUS VIA INFUSION
4000.0000 [IU] | Freq: Once | INTRAVENOUS | Status: DC
  Filled 2015-05-20: qty 4000

## 2015-05-20 MED ORDER — OXYCODONE-ACETAMINOPHEN 5-325 MG PO TABS: 1.0000 | ORAL_TABLET | ORAL | Status: DC | PRN

## 2015-05-20 SURGICAL SUPPLY — 20 items
BALLN EMERGE MR 2.5X15 (BALLOONS) ×2
BALLOON EMERGE MR 2.5X15 (BALLOONS) ×1 IMPLANT
CATH EXTRAC PRONTO LP 6F RND (CATHETERS) ×2 IMPLANT
CATH INFINITI 5 FR JL3.5 (CATHETERS) ×2 IMPLANT
CATH INFINITI 5FR ANG PIGTAIL (CATHETERS) ×2 IMPLANT
CATH INFINITI JR4 5F (CATHETERS) ×2 IMPLANT
CATH VISTA GUIDE 6FR XBLAD3.5 (CATHETERS) ×2 IMPLANT
DEVICE RAD COMP TR BAND LRG (VASCULAR PRODUCTS) ×2 IMPLANT
ELECT DEFIB PAD ADLT CADENCE (PAD) ×2 IMPLANT
GLIDESHEATH SLEND SS 6F .021 (SHEATH) ×2 IMPLANT
KIT ENCORE 26 ADVANTAGE (KITS) ×2 IMPLANT
KIT HEART LEFT (KITS) ×2 IMPLANT
PACK CARDIAC CATHETERIZATION (CUSTOM PROCEDURE TRAY) ×2 IMPLANT
STENT PROMUS PREM MR 3.0X28 (Permanent Stent) ×2 IMPLANT
SYR MEDRAD MARK V 150ML (SYRINGE) ×2 IMPLANT
TRANSDUCER W/STOPCOCK (MISCELLANEOUS) ×2 IMPLANT
TUBING CIL FLEX 10 FLL-RA (TUBING) ×2 IMPLANT
WIRE COUGAR XT STRL 190CM (WIRE) ×2 IMPLANT
WIRE HI TORQ WHISPER MS 190CM (WIRE) ×2 IMPLANT
WIRE SAFE-T 1.5MM-J .035X260CM (WIRE) ×2 IMPLANT

## 2015-05-20 NOTE — H&P (Signed)
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 The pt started having pain while driving around 41:28 am today. He came to the ED 13.30. EKG showed anterior ST elevation. He continues to have chest pain though his symptoms have eased- now 2-3/10. He was seen by Jon Jon Baker in the ED and after discussion with Jon Jon Baker will be taken directly to the cath lab.    Problem List: Past Medical History  Diagnosis Date  . DJD (degenerative joint disease)   . Chest pain     2009, saw  cardiology , decline a  stress test  . HTN (hypertension)   . Claudication     2009, saw  cardiology, declined ABIs  . COPD (chronic obstructive pulmonary disease)     states he has been told by MD he has copd  . Shortness of breath   . Pancreatitis, gallstone 05/2013  . STEMI (ST elevation myocardial infarction) 05/20/15    Past Surgical History  Procedure Laterality Date  . Myleogram  1979  . Total knee arthroplasty Right 02-2011    Jon Jon Baker  . Glanglian cyst removal Left 1970's  . Cholecystectomy N/A 06/06/2013    Procedure: LAPAROSCOPIC CHOLECYSTECTOMY WITH INTRAOPERATIVE CHOLANGIOGRAM;  Surgeon: Jon Malady, MD;  Location: [facility] OR;  Service: General;  Laterality: N/A;     Allergies: No Known Allergies   Home Medications Current Facility-Administered Medications  Medication Dose Route Frequency Provider Last Rate Last Dose  . 0.9 %  sodium chloride infusion   Intravenous Continuous Jon Laine, MD       Current Outpatient Prescriptions  Medication Sig Dispense Refill  . aspirin EC 81 MG tablet Take 1 tablet (81 mg total) by mouth daily. 100 tablet 3  . losartan-hydrochlorothiazide (HYZAAR)  50-12.5 MG per tablet Take 1 tablet by mouth daily. 90 tablet 3  . cholecalciferol (VITAMIN D) 1000 UNITS tablet Take 1 tablet (1,000 Units total) by mouth daily. (Patient not taking: Reported on 05/20/2015) 100 tablet 3     Family History  Problem Relation Age of Onset  . Coronary artery disease Mother     pancreatic cancer  . Coronary artery disease Father     several MI'S  . Diabetes Neg Hx   . Cancer Sister     breast cancer at age 49     History   Social History  . Marital Status: Married    Spouse Name: N/A  . Number of Children: N/A  . Years of Education: N/A   Occupational History  . Not on file.   Social History Main Topics  . Smoking status: Former Smoker    Quit date: 12/10/1987  . Smokeless tobacco: Never Used  . Alcohol Use: No  . Drug Use: No  . Sexual Activity: Not on file   Other Topics Concern  . Not on file   Social History Narrative     Review of Systems: General: negative for chills, fever, night sweats or weight changes.  Cardiovascular: negative for chest pain, dyspnea on exertion, edema, orthopnea, palpitations, paroxysmal nocturnal dyspnea or shortness of breath Dermatological: negative for rash Respiratory: negative for cough or wheezing Urologic: negative for hematuria Abdominal: negative for nausea,  vomiting, diarrhea, bright red blood per rectum, melena, or hematemesis Neurologic: negative for visual changes, syncope, or dizziness All other systems reviewed and are otherwise negative except as noted above.  Physical Exam: Blood pressure 155/107, pulse 70, temperature 97.8 F (36.6 C), temperature source Oral, resp. rate 24, height 5' 10.5" (1.791 m), weight 209 lb 9 oz (95.057 kg), SpO2 96 %.  General appearance: alert, cooperative and no distress Neck: no carotid bruit and no JVD Lungs: clear to auscultation bilaterally Heart: regular rate and rhythm Abdomen: soft, non-tender; bowel sounds normal; no masses,  no  organomegaly Extremities: extremities normal, atraumatic, no cyanosis or edema Pulses: 2+ and symmetric Skin: Skin color, texture, turgor normal. No rashes or lesions Neurologic: Grossly normal    Labs:   Results for orders placed or performed during the hospital encounter of 05/20/15 (from the past 24 hour(s))  I-Stat Troponin, ED (not at [facility], [facility])     Status: Abnormal   Collection Time: 05/20/15  2:05 PM  Result Value Ref Range   Troponin i, poc 0.18 (HH) 0.00 - 0.08 ng/mL   Comment NOTIFIED PHYSICIAN    Comment 3          I-Stat Chem 8, ED  (not at [facility], [facility])     Status: Abnormal   Collection Time: 05/20/15  2:07 PM  Result Value Ref Range   Sodium 139 135 - 145 mmol/L   Potassium 3.4 (L) 3.5 - 5.1 mmol/L   Chloride 99 (L) 101 - 111 mmol/L   BUN 25 (H) 6 - 20 mg/dL   Creatinine, Ser 0.45 0.61 - 1.24 mg/dL   Glucose, Bld 409 (H) 65 - 99 mg/dL   Calcium, Ion 8.11 (L) 1.13 - 1.30 mmol/L   TCO2 25 0 - 100 mmol/L   Hemoglobin 16.7 13.0 - 17.0 g/dL   HCT 91.4 78.2 - 95.6 %     Radiology/Studies: No results found.  EKG:NSR RBBB STE V1-V4  ASSESSMENT AND PLAN:  Principal Problem:   STEMI (ST elevation myocardial infarction) Active Problems:   Essential hypertension   COPD (chronic obstructive pulmonary disease)   PLAN: ASA, Heparin, NTG, cath   Jon Pretty, PA-C 05/20/2015, 2:28 PM [phone]  Agree with note written by Jon Baker PAC  81 year old fit-appearing Caucasian male patient of Jon. Paulette Baker history of hypertension who's had several weeks of off-and-on chest pain. He developed prolonged chest pain beginning at 11:30 this morning and was brought to [facility] emergency room where he had right bundle branch block with anterior ST segment elevation. Given ongoing chest pain. He was hemodynamically stable. His exam was benign. Plans will be for urgent cath for "anterior STEMI".The patient understands that risks included but are not limited to stroke  (1 in 1000), death (1 in 1000), kidney failure [usually temporary] (1 in 500), bleeding (1 in 200), allergic reaction [possibly serious] (1 in 200). The patient understands and agrees to proceed  [name] 05/20/2015 2:45 PM

## 2015-05-20 NOTE — ED Notes (Signed)
Pt c/o chest pain that started 1130-- has had similar pains over the past few days .

## 2015-05-20 NOTE — Progress Notes (Signed)
Chaplain responded to code stemi. Chaplain introduced herself to pt and family. Chaplain provided emotional support, offered prayer, and offered beverages. Chaplain escorted pt family to cath lab waiting area. More family to arrive. Page chaplain as needed.     05/20/15 1500  Clinical Encounter Type  Visited With Patient and family together;Health care provider  Visit Type Initial;Spiritual support  Spiritual Encounters  Spiritual Needs Emotional;Prayer  Stress Factors  Patient Stress Factors Health changes  Family Stress Factors Lack of knowledge  Jon Baker 05/20/2015 3:02 PM

## 2015-05-20 NOTE — Progress Notes (Signed)
ANTICOAGULATION CONSULT NOTE - Initial Consult  Pharmacy Consult for Heparin Indication: chest pain/ACS  No Known Allergies  Patient Measurements: Height: 5' 10.5" (179.1 cm) Weight: 209 lb 9 oz (95.057 kg) IBW/kg (Calculated) : 74.15 Heparin Dosing Weight:  95 kg  Vital Signs: Temp: 97.8 F (36.6 C) (06/11 1354) Temp Source: Oral (06/11 1354) BP: 155/107 mmHg (06/11 1354) Pulse Rate: 70 (06/11 1354)  Labs:  Recent Labs  05/20/15 1401 05/20/15 1407  HGB 16.0 16.7  HCT 44.0 49.0  PLT 257  --   CREATININE  --  1.00    Estimated Creatinine Clearance: 67.7 mL/min (by C-G formula based on Cr of 1).   Medical History: Past Medical History  Diagnosis Date  . DJD (degenerative joint disease)   . Chest pain     2009, saw  cardiology , decline a  stress test  . HTN (hypertension)   . Claudication     2009, saw  cardiology, declined ABIs  . COPD (chronic obstructive pulmonary disease)     states he has been told by MD he has copd  . Shortness of breath   . Pancreatitis, gallstone 05/2013  . STEMI (ST elevation myocardial infarction) 05/20/15    Medications:  See med rec  Assessment: 81 y/o M presents with CP admitted with STEMI. Currently still in ED but plan cath lab. Baseline CBC WNL.  Goal of Therapy:  Heparin level 0.3-0.7 units/ml Monitor platelets by anticoagulation protocol: Yes   Plan:  Heparin 4000 unit IV bolus Heparin infusion 1300 units/hr Check heparin level in 6 hrs if heparin continued Daily heparin level and CBC   Jon Baker S. Merilynn Finland, PharmD, BCPS Clinical Staff Pharmacist Pager [phone]  Misty Stanley Stillinger 05/20/2015,2:40 PM

## 2015-05-20 NOTE — ED Notes (Signed)
Pt. Stated, I started having chest pain and rt. Arm pain started at 1130, today.

## 2015-05-20 NOTE — ED Provider Notes (Signed)
CSN: [id number]     Arrival date & time 05/20/15  1321 History   First MD Initiated Contact with Patient 05/20/15 1354     Chief Complaint  Patient presents with  . Chest Pain  . Arm Pain  . Code STEMI     (Consider location/radiation/quality/duration/timing/severity/associated sxs/prior Treatment) Patient is a 81 y.o. male presenting with chest pain and arm pain. The history is provided by the patient.  Chest Pain Associated symptoms: no abdominal pain, no back pain, no fever, no headache, no palpitations, no shortness of breath and not vomiting   Arm Pain Associated symptoms include chest pain. Pertinent negatives include no abdominal pain, no headaches and no shortness of breath.  Patient c/o mid sternal chest pain onset approximately 1130 today, at rest. Pain persistent, constant, moderate. Radiates to neck/throat, and left arm. No back pain. No tearing/ripping sensation or back pain. No hx cad. No prior cath. Pt states in past couple weeks intermittent similar pain, but not nearly as bad or prolonged. No associated nv, diaphoresis or sob. Pain is not pleuritic. No leg pain or swelling. No cough or uri c/o.       Past Medical History  Diagnosis Date  . DJD (degenerative joint disease)   . Chest pain     2009, saw  cardiology , decline a  stress test  . HTN (hypertension)   . Claudication     2009, saw  cardiology, declined ABIs  . COPD (chronic obstructive pulmonary disease)     states he has been told by MD he has copd  . Shortness of breath   . Pancreatitis, gallstone 05/2013   Past Surgical History  Procedure Laterality Date  . Myleogram  1979  . Total knee arthroplasty Right 02-2011    Dr Despina Hick  . Glanglian cyst removal Left 1970's  . Cholecystectomy N/A 06/06/2013    Procedure: LAPAROSCOPIC CHOLECYSTECTOMY WITH INTRAOPERATIVE CHOLANGIOGRAM;  Surgeon: Liz Malady, MD;  Location: [facility] OR;  Service: General;  Laterality: N/A;   Family History  Problem Relation Age  of Onset  . Coronary artery disease Mother     pancreatic cancer  . Coronary artery disease Father     several MI'S  . Diabetes Neg Hx   . Cancer Sister     breast cancer at age 23   History  Substance Use Topics  . Smoking status: Former Smoker    Quit date: 12/10/1987  . Smokeless tobacco: Never Used  . Alcohol Use: No    Review of Systems  Constitutional: Negative for fever and chills.  HENT: Negative for sore throat.   Eyes: Negative for redness.  Respiratory: Negative for shortness of breath.   Cardiovascular: Positive for chest pain. Negative for palpitations and leg swelling.  Gastrointestinal: Negative for vomiting, abdominal pain and diarrhea.  Genitourinary: Negative for flank pain.  Musculoskeletal: Negative for back pain and neck pain.  Skin: Negative for rash.  Neurological: Negative for headaches.  Hematological: Does not bruise/bleed easily.  Psychiatric/Behavioral: Negative for confusion.      Allergies  Review of patient's allergies indicates no known allergies.  Home Medications   Prior to Admission medications   Medication Sig Start Date End Date Taking? Authorizing Provider  aspirin EC 81 MG tablet Take 1 tablet (81 mg total) by mouth daily. 03/31/15   Aleksei [name] V, MD  cholecalciferol (VITAMIN D) 1000 UNITS tablet Take 1 tablet (1,000 Units total) by mouth daily. 03/31/15 03/30/16  [name], MD  losartan-hydrochlorothiazide (  HYZAAR) 50-12.5 MG per tablet Take 1 tablet by mouth daily. 03/31/15   Aleksei [name] V, MD   BP 155/107 mmHg  Pulse 70  Temp(Src) 97.8 F (36.6 C) (Oral)  Resp 24  Ht 5' 10.5" (1.791 m)  Wt 209 lb 9 oz (95.057 kg)  BMI 29.63 kg/m2  SpO2 96% Physical Exam  Constitutional: He is oriented to person, place, and time. He appears well-developed and well-nourished. No distress.  HENT:  Mouth/Throat: Oropharynx is clear and moist.  Eyes: Conjunctivae are normal.  Neck: Neck supple. No tracheal deviation  present.  Cardiovascular: Normal rate, regular rhythm, normal heart sounds and intact distal pulses.  Exam reveals no gallop and no friction rub.   No murmur heard. Pulmonary/Chest: Effort normal and breath sounds normal. No accessory muscle usage. No respiratory distress. He exhibits no tenderness.  Abdominal: Soft. He exhibits no distension. There is no tenderness.  Musculoskeletal: Normal range of motion. He exhibits no edema or tenderness.  Neurological: He is alert and oriented to person, place, and time.  Skin: Skin is warm and dry. He is not diaphoretic.  Psychiatric: He has a normal mood and affect.  Nursing note and vitals reviewed.   ED Course  Procedures (including critical care time) Labs Review  Results for orders placed or performed during the hospital encounter of 05/20/15  APTT  Result Value Ref Range   aPTT 31 24 - 37 seconds  CBC  Result Value Ref Range   WBC 10.3 4.0 - 10.5 K/uL   RBC 4.90 4.22 - 5.81 MIL/uL   Hemoglobin 16.0 13.0 - 17.0 g/dL   HCT 47.8 29.5 - 62.1 %   MCV 89.8 78.0 - 100.0 fL   MCH 32.7 26.0 - 34.0 pg   MCHC 36.4 (H) 30.0 - 36.0 g/dL   RDW 30.8 65.7 - 84.6 %   Platelets 257 150 - 400 K/uL  Protime-INR  Result Value Ref Range   Prothrombin Time 14.4 11.6 - 15.2 seconds   INR 1.10 0.00 - 1.49  I-Stat Troponin, ED (not at [facility], [facility])  Result Value Ref Range   Troponin i, poc 0.18 (HH) 0.00 - 0.08 ng/mL   Comment NOTIFIED PHYSICIAN    Comment 3          I-Stat Chem 8, ED  (not at [facility], [facility])  Result Value Ref Range   Sodium 139 135 - 145 mmol/L   Potassium 3.4 (L) 3.5 - 5.1 mmol/L   Chloride 99 (L) 101 - 111 mmol/L   BUN 25 (H) 6 - 20 mg/dL   Creatinine, Ser 9.62 0.61 - 1.24 mg/dL   Glucose, Bld 952 (H) 65 - 99 mg/dL   Calcium, Ion 8.41 (L) 1.13 - 1.30 mmol/L   TCO2 25 0 - 100 mmol/L   Hemoglobin 16.7 13.0 - 17.0 g/dL   HCT 32.4 40.1 - 02.7 %   Dg Chest Port 1 View  05/20/2015   CLINICAL DATA:  Code STEMI  EXAM: PORTABLE CHEST - 1  VIEW  COMPARISON:  06/04/2013  FINDINGS: The heart size and mediastinal contours are within normal limits. Both lungs are clear. The visualized skeletal structures are unremarkable.  IMPRESSION: No active disease.   Electronically Signed   By: Elige Ko   On: 05/20/2015 15:02       EKG Interpretation   Date/Time:  Saturday May 20 2015 13:43:36 EDT Ventricular Rate:  70 PR Interval:  152 QRS Duration: 154 QT Interval:  440 QTC Calculation: 475 R  Axis:   82 Text Interpretation:  Critical Test Result: STEMI Normal sinus rhythm  Right bundle branch block Anteroseptal infarct , acute ACUTE MI / STEMI  Confirmed by Denton Lank  MD, Caryn Bee ([id number]) on 05/20/2015  1:57:03 PM      MDM   Iv ns. Continuous pulse ox and monitor. o2 [place].  Upon initially reviewing ecg - stemi called. Cath lab activated.  Interventional cardiologist contact.  Labs.  Chewable asa.   Reviewed nursing notes and prior charts for additional history.   Pt initially indicates no cardiologist.  interventionalist on call, Dr Sharyn Lull responded to stemi paged - requests asa, heparin, and also brilinta 180 mg.    Pts spouse indicates pcp is [name] - Leb card in ED - they indicate they will take to cath lab. Dr Sharyn Lull notified.  Recheck, cp persists but improved.  CRITICAL CARE  RE acute chest pain, STEMI Performed by: Suzi Roots Total critical care time: 30 Critical care time was exclusive of separately billable procedures and treating other patients. Critical care was necessary to treat or prevent imminent or life-threatening deterioration. Critical care was time spent personally by me on the following activities: development of treatment plan with patient and/or surrogate as well as nursing, discussions with consultants, evaluation of patient's response to treatment, examination of patient, obtaining history from patient or surrogate, ordering and performing treatments and interventions,  ordering and review of laboratory studies, ordering and review of radiographic studies, pulse oximetry and re-evaluation of patient's condition.     Cathren Laine, MD 05/20/15 [phone]

## 2015-05-21 LAB — BASIC METABOLIC PANEL
Anion gap: 10 (ref 5–15)
BUN: 13 mg/dL (ref 6–20)
CO2: 25 mmol/L (ref 22–32)
Calcium: 8.3 mg/dL — ABNORMAL LOW (ref 8.9–10.3)
Chloride: 98 mmol/L — ABNORMAL LOW (ref 101–111)
Creatinine, Ser: 0.98 mg/dL (ref 0.61–1.24)
GFR calc Af Amer: >60 mL/min (ref >60)
GFR calc non Af Amer: >60 mL/min (ref >60)
Glucose, Bld: 140 mg/dL — ABNORMAL HIGH (ref 65–99)
Potassium: 3 mmol/L — ABNORMAL LOW (ref 3.5–5.1)
Sodium: 133 mmol/L — ABNORMAL LOW (ref 135–145)

## 2015-05-21 LAB — CBC
HCT: 42.5 % (ref 39.0–52.0)
Hemoglobin: 15.5 g/dL (ref 13.0–17.0)
MCH: 32.5 pg (ref 26.0–34.0)
MCHC: 36.5 g/dL — ABNORMAL HIGH (ref 30.0–36.0)
MCV: 89.1 fL (ref 78.0–100.0)
Platelets: 254 10*3/uL (ref 150–400)
RBC: 4.77 MIL/uL (ref 4.22–5.81)
RDW: 12.5 % (ref 11.5–15.5)
WBC: 12.5 10*3/uL — ABNORMAL HIGH (ref 4.0–10.5)

## 2015-05-21 LAB — LIPID PANEL
Cholesterol: 182 mg/dL (ref 0–200)
HDL: 30 mg/dL — ABNORMAL LOW (ref >40)
LDL Cholesterol: 122 mg/dL — ABNORMAL HIGH (ref 0–99)
Total CHOL/HDL Ratio: 6.1 RATIO
Triglycerides: 152 mg/dL — ABNORMAL HIGH (ref <150)
VLDL: 30 mg/dL (ref 0–40)

## 2015-05-21 LAB — TROPONIN I: Troponin I: 31.3 ng/mL (ref <0.031)

## 2015-05-21 MED ORDER — POTASSIUM CHLORIDE CRYS ER 20 MEQ PO TBCR
40.0000 meq | EXTENDED_RELEASE_TABLET | Freq: Once | ORAL | Status: AC
  Administered 2015-05-21: 40 meq via ORAL
  Filled 2015-05-21: qty 2

## 2015-05-21 NOTE — Progress Notes (Signed)
Report called to Angelica RN on 2W. Patient to be transferred to room 2W27 via WC.

## 2015-05-21 NOTE — Progress Notes (Signed)
Subjective:  POD #1 Ant STEMI Rx with PCI/Stent DES prox LAD. No CP. ambulating  Objective:  Temp:  [97.7 F (36.5 C)-98.2 F (36.8 C)] 98.2 F (36.8 C) (06/12 0800) Pulse Rate:  [0-123] 61 (06/12 0900) Resp:  [0-24] 20 (06/12 0800) BP: (106-176)/(72-133) 122/78 mmHg (06/12 0900) SpO2:  [0 %-99 %] 94 % (06/12 0900) Weight:  [204 lb 6.4 oz (92.715 kg)-209 lb 9 oz (95.057 kg)] 204 lb 6.4 oz (92.715 kg) (06/12 0500) Weight change:   Intake/Output from previous day: 06/11 0701 - 06/12 0700 In: 609.3 [P.O.:240; I.V.:369.3] Out: 2325 [Urine:2325]  Intake/Output from this shift: Total I/O In: 240 [P.O.:240] Out: -   Physical Exam: General appearance: alert and no distress Neck: no adenopathy, no carotid bruit, no JVD, supple, symmetrical, trachea midline and thyroid not enlarged, symmetric, no tenderness/mass/nodules Lungs: clear to auscultation bilaterally Heart: regular rate and rhythm, S1, S2 normal, no murmur, click, rub or gallop Extremities: extremities normal, atraumatic, no cyanosis or edema and Right wrist OK  Lab Results: Results for orders placed or performed during the hospital encounter of 05/20/15 (from the past 48 hour(s))  APTT     Status: None   Collection Time: 05/20/15  2:01 PM  Result Value Ref Range   aPTT 31 24 - 37 seconds  CBC     Status: Abnormal   Collection Time: 05/20/15  2:01 PM  Result Value Ref Range   WBC 10.3 4.0 - 10.5 K/uL   RBC 4.90 4.22 - 5.81 MIL/uL   Hemoglobin 16.0 13.0 - 17.0 g/dL   HCT 44.0 39.0 - 52.0 %   MCV 89.8 78.0 - 100.0 fL   MCH 32.7 26.0 - 34.0 pg   MCHC 36.4 (H) 30.0 - 36.0 g/dL   RDW 12.5 11.5 - 15.5 %   Platelets 257 150 - 400 K/uL  Comprehensive metabolic panel     Status: Abnormal   Collection Time: 05/20/15  2:01 PM  Result Value Ref Range   Sodium 137 135 - 145 mmol/L   Potassium 3.2 (L) 3.5 - 5.1 mmol/L   Chloride 100 (L) 101 - 111 mmol/L   CO2 26 22 - 32 mmol/L   Glucose, Bld 131 (H) 65 - 99 mg/dL     BUN 18 6 - 20 mg/dL   Creatinine, Ser 1.08 0.61 - 1.24 mg/dL   Calcium 9.0 8.9 - 10.3 mg/dL   Total Protein 6.6 6.5 - 8.1 g/dL   Albumin 3.8 3.5 - 5.0 g/dL   AST 41 15 - 41 U/L   ALT 34 17 - 63 U/L   Alkaline Phosphatase 74 38 - 126 U/L   Total Bilirubin 1.2 0.3 - 1.2 mg/dL   GFR calc non Af Amer >60 >60 mL/min   GFR calc Af Amer >60 >60 mL/min    Comment: (NOTE) The eGFR has been calculated using the CKD EPI equation. This calculation has not been validated in all clinical situations. eGFR's persistently <60 mL/min signify possible Chronic Kidney Disease.    Anion gap 11 5 - 15  Protime-INR     Status: None   Collection Time: 05/20/15  2:01 PM  Result Value Ref Range   Prothrombin Time 14.4 11.6 - 15.2 seconds   INR 1.10 0.00 - 1.49  Brain natriuretic peptide     Status: Abnormal   Collection Time: 05/20/15  2:01 PM  Result Value Ref Range   B Natriuretic Peptide 317.1 (H) 0.0 - 100.0 pg/mL  I-Stat  Troponin, ED (not at [facility], [facility])     Status: Abnormal   Collection Time: 05/20/15  2:05 PM  Result Value Ref Range   Troponin i, poc 0.18 (HH) 0.00 - 0.08 ng/mL   Comment NOTIFIED PHYSICIAN    Comment 3            Comment: Due to the release kinetics of cTnI, a negative result within the first hours of the onset of symptoms does not rule out myocardial infarction with certainty. If myocardial infarction is still suspected, repeat the test at appropriate intervals.   I-Stat Chem 8, ED  (not at [facility], [facility])     Status: Abnormal   Collection Time: 05/20/15  2:07 PM  Result Value Ref Range   Sodium 139 135 - 145 mmol/L   Potassium 3.4 (L) 3.5 - 5.1 mmol/L   Chloride 99 (L) 101 - 111 mmol/L   BUN 25 (H) 6 - 20 mg/dL   Creatinine, Ser 1.00 0.61 - 1.24 mg/dL   Glucose, Bld 129 (H) 65 - 99 mg/dL   Calcium, Ion 1.10 (L) 1.13 - 1.30 mmol/L   TCO2 25 0 - 100 mmol/L   Hemoglobin 16.7 13.0 - 17.0 g/dL   HCT 49.0 39.0 - 52.0 %  POCT Activated clotting time     Status: None    Collection Time: 05/20/15  2:56 PM  Result Value Ref Range   Activated Clotting Time 472 seconds  MRSA PCR Screening     Status: None   Collection Time: 05/20/15  4:31 PM  Result Value Ref Range   MRSA by PCR NEGATIVE NEGATIVE    Comment:        The GeneXpert MRSA Assay (FDA approved for NASAL specimens only), is one component of a comprehensive MRSA colonization surveillance program. It is not intended to diagnose MRSA infection nor to guide or monitor treatment for MRSA infections.   Troponin I     Status: Abnormal   Collection Time: 05/20/15  5:26 PM  Result Value Ref Range   Troponin I 45.94 (HH) <0.031 ng/mL    Comment:        POSSIBLE MYOCARDIAL ISCHEMIA. SERIAL TESTING RECOMMENDED. REPEATED TO VERIFY CRITICAL RESULT CALLED TO, READ BACK BY AND VERIFIED WITH: RN PREVITTE,S AT [id number] MARTINB   CBC     Status: None   Collection Time: 05/20/15  5:26 PM  Result Value Ref Range   WBC 8.4 4.0 - 10.5 K/uL   RBC 4.88 4.22 - 5.81 MIL/uL   Hemoglobin 15.6 13.0 - 17.0 g/dL   HCT 43.6 39.0 - 52.0 %   MCV 89.3 78.0 - 100.0 fL   MCH 32.0 26.0 - 34.0 pg   MCHC 35.8 30.0 - 36.0 g/dL   RDW 12.4 11.5 - 15.5 %   Platelets 259 150 - 400 K/uL  Creatinine, serum     Status: None   Collection Time: 05/20/15  5:26 PM  Result Value Ref Range   Creatinine, Ser 0.99 0.61 - 1.24 mg/dL   GFR calc non Af Amer >60 >60 mL/min   GFR calc Af Amer >60 >60 mL/min    Comment: (NOTE) The eGFR has been calculated using the CKD EPI equation. This calculation has not been validated in all clinical situations. eGFR's persistently <60 mL/min signify possible Chronic Kidney Disease.   Troponin I     Status: Abnormal   Collection Time: 05/20/15  8:42 PM  Result Value Ref Range   Troponin I >65.00 (HH) <0.031  ng/mL    Comment:        POSSIBLE MYOCARDIAL ISCHEMIA. SERIAL TESTING RECOMMENDED. REPEATED TO VERIFY CRITICAL VALUE NOTED.  VALUE IS CONSISTENT WITH PREVIOUSLY REPORTED AND CALLED  VALUE.   Troponin I     Status: Abnormal   Collection Time: 05/21/15  2:25 AM  Result Value Ref Range   Troponin I 31.30 (HH) <0.031 ng/mL    Comment:        POSSIBLE MYOCARDIAL ISCHEMIA. SERIAL TESTING RECOMMENDED. REPEATED TO VERIFY CRITICAL VALUE NOTED.  VALUE IS CONSISTENT WITH PREVIOUSLY REPORTED AND CALLED VALUE.   Basic metabolic panel     Status: Abnormal   Collection Time: 05/21/15  2:25 AM  Result Value Ref Range   Sodium 133 (L) 135 - 145 mmol/L   Potassium 3.0 (L) 3.5 - 5.1 mmol/L   Chloride 98 (L) 101 - 111 mmol/L   CO2 25 22 - 32 mmol/L   Glucose, Bld 140 (H) 65 - 99 mg/dL   BUN 13 6 - 20 mg/dL   Creatinine, Ser 3.07 0.61 - 1.24 mg/dL   Calcium 8.3 (L) 8.9 - 10.3 mg/dL   GFR calc non Af Amer >60 >60 mL/min   GFR calc Af Amer >60 >60 mL/min    Comment: (NOTE) The eGFR has been calculated using the CKD EPI equation. This calculation has not been validated in all clinical situations. eGFR's persistently <60 mL/min signify possible Chronic Kidney Disease.    Anion gap 10 5 - 15  CBC     Status: Abnormal   Collection Time: 05/21/15  2:25 AM  Result Value Ref Range   WBC 12.5 (H) 4.0 - 10.5 K/uL   RBC 4.77 4.22 - 5.81 MIL/uL   Hemoglobin 15.5 13.0 - 17.0 g/dL   HCT 41.3 22.5 - 29.1 %   MCV 89.1 78.0 - 100.0 fL   MCH 32.5 26.0 - 34.0 pg   MCHC 36.5 (H) 30.0 - 36.0 g/dL   RDW 63.6 93.8 - 02.9 %   Platelets 254 150 - 400 K/uL  Lipid panel     Status: Abnormal   Collection Time: 05/21/15  2:25 AM  Result Value Ref Range   Cholesterol 182 0 - 200 mg/dL   Triglycerides 686 (H) <150 mg/dL   HDL 30 (L) >95 mg/dL   Total CHOL/HDL Ratio 6.1 RATIO   VLDL 30 0 - 40 mg/dL   LDL Cholesterol 792 (H) 0 - 99 mg/dL    Comment:        Total Cholesterol/HDL:CHD Risk Coronary Heart Disease Risk Table                     Men   Women  1/2 Average Risk   3.4   3.3  Average Risk       5.0   4.4  2 X Average Risk   9.6   7.1  3 X Average Risk  23.4   11.0        Use the  calculated Patient Ratio above and the CHD Risk Table to determine the patient's CHD Risk.        ATP III CLASSIFICATION (LDL):  <100     mg/dL   Optimal  382-509  mg/dL   Near or Above                    Optimal  130-159  mg/dL   Borderline  288-013  mg/dL   High  >272  mg/dL   Very High     Imaging: Imaging results have been reviewed  Assessment/Plan:   1. Principal Problem: 2.   STEMI (ST elevation myocardial infarction) 3. Active Problems: 4.   Essential hypertension 5.   COPD (chronic obstructive pulmonary disease) 6.   Time Spent Directly with Patient:  20 minutes  Length of Stay:  LOS: 1 day   POD # 1 Ant STEMI Rx radially by Dr. Billee Cashing with DES. EF 35% with Ant-ap WMA. 2D pending. EKG improved. Trop 30. K 3.0---->> replete. On DAPT with Brilenta. On low dose BB. HR 60s. Was on Losartan - HCTZ at home for HTN. Restart tomorrow when ambulating. Prob home Tues. [facility].    Quay Burow 05/21/2015, 9:33 AM

## 2015-05-22 ENCOUNTER — Encounter (HOSPITAL_COMMUNITY): Payer: Self-pay | Admitting: Cardiovascular Disease

## 2015-05-22 ENCOUNTER — Inpatient Hospital Stay (HOSPITAL_COMMUNITY): Payer: Medicare Other

## 2015-05-22 DIAGNOSIS — I2102 ST elevation (STEMI) myocardial infarction involving left anterior descending coronary artery: Secondary | ICD-10-CM

## 2015-05-22 DIAGNOSIS — I2510 Atherosclerotic heart disease of native coronary artery without angina pectoris: Secondary | ICD-10-CM

## 2015-05-22 DIAGNOSIS — R079 Chest pain, unspecified: Secondary | ICD-10-CM

## 2015-05-22 DIAGNOSIS — E785 Hyperlipidemia, unspecified: Secondary | ICD-10-CM

## 2015-05-22 LAB — BASIC METABOLIC PANEL
Anion gap: 9 (ref 5–15)
BUN: 15 mg/dL (ref 6–20)
CO2: 25 mmol/L (ref 22–32)
Calcium: 9 mg/dL (ref 8.9–10.3)
Chloride: 103 mmol/L (ref 101–111)
Creatinine, Ser: 0.97 mg/dL (ref 0.61–1.24)
Glucose, Bld: 93 mg/dL (ref 65–99)
Potassium: 3.5 mmol/L (ref 3.5–5.1)
SODIUM: 137 mmol/L (ref 135–145)

## 2015-05-22 LAB — CBC
HEMATOCRIT: 44.9 % (ref 39.0–52.0)
Hemoglobin: 16.3 g/dL (ref 13.0–17.0)
MCH: 32.7 pg (ref 26.0–34.0)
MCHC: 36.3 g/dL — ABNORMAL HIGH (ref 30.0–36.0)
MCV: 90.2 fL (ref 78.0–100.0)
PLATELETS: 259 10*3/uL (ref 150–400)
RBC: 4.98 MIL/uL (ref 4.22–5.81)
RDW: 12.8 % (ref 11.5–15.5)
WBC: 9.9 10*3/uL (ref 4.0–10.5)

## 2015-05-22 LAB — MAGNESIUM: Magnesium: 2.4 mg/dL (ref 1.7–2.4)

## 2015-05-22 MED ORDER — LOSARTAN POTASSIUM 25 MG PO TABS
25.0000 mg | ORAL_TABLET | Freq: Every day | ORAL | Status: DC
  Administered 2015-05-22 – 2015-05-23 (×2): 25 mg via ORAL
  Filled 2015-05-22 (×2): qty 1

## 2015-05-22 MED FILL — Lidocaine HCl Local Preservative Free (PF) Inj 1%: INTRAMUSCULAR | Qty: 30 | Status: AC

## 2015-05-22 MED FILL — Heparin Sodium (Porcine) 2 Unit/ML in Sodium Chloride 0.9%: INTRAMUSCULAR | Qty: 1000 | Status: AC

## 2015-05-22 MED FILL — Ticagrelor Tab 90 MG: ORAL | Qty: 1 | Status: AC

## 2015-05-22 NOTE — Progress Notes (Signed)
CARDIAC REHAB PHASE I   PRE:  Rate/Rhythm: 75 SR  BP:  Supine: 134/90  Sitting:   Standing:    SaO2: 95%RA  MODE:  Ambulation: 550 ft   POST:  Rate/Rhythm: 81SR  BP:  Supine:   Sitting: 140/90  Standing:    SaO2: 94%RA 0839-1002 Pt walked 550 ft on RA with hand held asst. No CP. Tolerated well. MI and CHF education completed with pt and family. Reviewed CHF ed because of Low EF. Gave CHF booklet, MI booklet, low sodium and heart healthy diets, ex ed. Pt's daughter wanted to tape session per phone but I asked her to take notes instead for privacy. Checked with her several times to see if I needed to repeat information and discussed what was written so she did not have to write down. Pt declined CRP 2 but brochure left in case he changes his mind. Put recliner in room for him to use later.  Discussed importance of low sodium and daily weights. Pt able to answer teach back questions. Pt's wife has brililnta booklet at home. Re enforced that she will need card when she gets prescription to get free. Stressed importance of brilinta with stent.   Luetta Nutting, RN BSN  05/22/2015 9:58 AM

## 2015-05-22 NOTE — Progress Notes (Signed)
  Echocardiogram 2D Echocardiogram has been performed.  Jon Baker 05/22/2015, 11:25 AM

## 2015-05-22 NOTE — Progress Notes (Signed)
Patient: Jon Baker / Admit Date: 05/20/2015 / Date of Encounter: 05/22/2015, 10:37 AM   Subjective: Feeling well. No CP or SOB.  Objective: Telemetry: NSR/SB Physical Exam: Blood pressure 139/86, pulse 65, temperature 97.8 F (36.6 C), temperature source Oral, resp. rate 18, height  (1.778 m), weight 204 lb 2.3 oz (92.6 kg), SpO2 95 %. General: Well developed, well nourished WM, in no acute distress. Head: Normocephalic, atraumatic, sclera non-icteric, no xanthomas, nares are without discharge. Neck: Negative for carotid bruits. JVP not elevated. Lungs: Clear bilaterally to auscultation without wheezes, rales, or rhonchi. Breathing is unlabored. Heart: RRR S1 S2 without murmurs, rubs, or gallops.  Abdomen: Soft, non-tender, non-distended with normoactive bowel sounds. No rebound/guarding. Extremities: No clubbing or cyanosis. No edema. Distal pedal pulses are 2+ and equal bilaterally. Right radial site without hematoma or ecchymosis; good pulse Neuro: Alert and oriented X 3. Moves all extremities spontaneously. Psych:  Responds to questions appropriately with a normal affect.   Intake/Output Summary (Last 24 hours) at 05/22/15 1037 Last data filed at 05/22/15 0900  Gross per 24 hour  Intake    840 ml  Output    950 ml  Net   -110 ml    Inpatient Medications:  . aspirin EC  81 mg Oral Daily  . atorvastatin  80 mg Oral q1800  . heparin  5,000 Units Subcutaneous 3 times per day  . metoprolol tartrate  12.5 mg Oral BID  . ticagrelor  90 mg Oral BID   Infusions:    Labs:  Recent Labs  05/20/15 1401 05/20/15 1407 05/20/15 1726 05/21/15 0225  NA 137 139  --  133*  K 3.2* 3.4*  --  3.0*  CL 100* 99*  --  98*  CO2 26  --   --  25  GLUCOSE 131* 129*  --  140*  BUN 18 25*  --  13  CREATININE 1.08 1.00 0.99 0.98  CALCIUM 9.0  --   --  8.3*    Recent Labs  05/20/15 1401  AST 41  ALT 34  ALKPHOS 74  BILITOT 1.2  PROT 6.6  ALBUMIN 3.8    Recent Labs  05/20/15 1726 05/21/15 0225  WBC 8.4 12.5*  HGB 15.6 15.5  HCT 43.6 42.5  MCV 89.3 89.1  PLT 259 254    Recent Labs  05/20/15 1726 05/20/15 2042 05/21/15 0225  TROPONINI 45.94* >65.00* 31.30*   Invalid input(s): POCBNP No results for input(s): HGBA1C in the last 72 hours.   Radiology/Studies:  Dg Chest Port 1 View  05/20/2015   CLINICAL DATA:  Code STEMI  EXAM: PORTABLE CHEST - 1 VIEW  COMPARISON:  06/04/2013  FINDINGS: The heart size and mediastinal contours are within normal limits. Both lungs are clear. The visualized skeletal structures are unremarkable.  IMPRESSION: No active disease.   Electronically Signed   By: Elige Ko   On: 05/20/2015 15:02     Assessment and Plan  1. Anterior STEMI s/p PTCA/DES/aspiration thrombectomy of proximal LAD with nonobstructive LCx/RCA stenosis - continue aspirin, BB, statin, Brilinta. Doing well post PCI. 2. Ischemic cardiomyopathy EF 35% by cath 05/20/15 with akinesis of the mid-anterior wall, LV apex, and inferoapex - repeat echo is being, being done this afternoon. Since BP tends to fluctuate (down to 100/70 yesterday) will resume home Losartan at  daily. (Will not resume HCTZ component at this time.) We can consider addition of spironolactone once ARB has been titrated.  3, Hypertension - see above.  4. COPD - tolerating metoprolol so far. 5. Dyslipidemia - continue high dose statin. 6. Hypokalemia - repeat BMET today and start daily supplementation if needed. May be residual from HCTZ as outpatient. Check Mg. 7. Leukocytosis - no localizing infective symptoms. Follow.  8. Hyperglycemia - since we are repeating labs today to assess K, will also tack on A1C.   Likely home in AM if he tolerates readdition of meds and we can stabilize K.  Signed, Ronie Spies PA-C Pager: [phone]   Patient seen and examined. Agree with assessment and plan. No recurrent chest pain. Hopeful for stunned rather than irreversible myocardial damage. F/U  echo being done today but still potential for further improvement with time.  Will start ARB post MI and titrate as BP allows.   Lennette Bihari, MD, [facility] 05/22/2015 10:57 AM

## 2015-05-22 NOTE — Care Management Note (Addendum)
Case Management Note Donn Pierini RN, BSN Unit 2W-Case Manager [phone]  Patient Details  Name: Jon Baker MRN: [id number] Date of Birth: 04/17/1932  Subjective/Objective:   Pt admitted with STEMI- s/p cath- started on Brilinta                 Action/Plan: PTA pt lived at home with spouse- spoke with pt and wife at bedside- per conversation pt's wife thinks she already has Brilinta book with 30 day free card at home- will double check and let CM know if she needs 30 day free card for discharge- per benefits check- pt has $45 copay for 30 day supply - no pre-auth needed- and can use any in network pharmacy ([facility], [facility], [facility], [facility]) [facility]  (pt has part D with [employer] [facility]- ID- [phone])  Expected Discharge Date:                  Expected Discharge Plan:  Home/Self Care  In-House Referral:     Discharge planning Services  CM Consult  Post Acute Care Choice:    Choice offered to:     DME Arranged:    DME Agency:     HH Arranged:    HH Agency:     Status of Service:  Completed, signed off  Medicare Important Message Given:    Date Medicare IM Given:    Medicare IM give by:    Date Additional Medicare IM Given:    Additional Medicare Important Message give by:     If discussed at Long Length of Stay Meetings, dates discussed:    Additional Comments:  Darrold Span, RN 05/22/2015, 5:05 PM

## 2015-05-23 ENCOUNTER — Telehealth: Payer: Self-pay | Admitting: Cardiovascular Disease

## 2015-05-23 ENCOUNTER — Encounter (HOSPITAL_COMMUNITY): Payer: Self-pay | Admitting: Physician Assistant

## 2015-05-23 LAB — HEMOGLOBIN A1C
Hgb A1c MFr Bld: 5.6 % (ref 4.8–5.6)
Mean Plasma Glucose: 114 mg/dL

## 2015-05-23 LAB — BASIC METABOLIC PANEL
Anion gap: 9 (ref 5–15)
BUN: 16 mg/dL (ref 6–20)
CO2: 24 mmol/L (ref 22–32)
Calcium: 8.8 mg/dL — ABNORMAL LOW (ref 8.9–10.3)
Chloride: 107 mmol/L (ref 101–111)
Creatinine, Ser: 0.95 mg/dL (ref 0.61–1.24)
GFR calc Af Amer: >60 mL/min (ref >60)
GFR calc non Af Amer: >60 mL/min (ref >60)
GLUCOSE: 92 mg/dL (ref 65–99)
POTASSIUM: 3.6 mmol/L (ref 3.5–5.1)
Sodium: 140 mmol/L (ref 135–145)

## 2015-05-23 LAB — CBC
HCT: 44.2 % (ref 39.0–52.0)
Hemoglobin: 15.7 g/dL (ref 13.0–17.0)
MCH: 32.2 pg (ref 26.0–34.0)
MCHC: 35.5 g/dL (ref 30.0–36.0)
MCV: 90.6 fL (ref 78.0–100.0)
Platelets: 259 10*3/uL (ref 150–400)
RBC: 4.88 MIL/uL (ref 4.22–5.81)
RDW: 12.8 % (ref 11.5–15.5)
WBC: 9.6 10*3/uL (ref 4.0–10.5)

## 2015-05-23 LAB — BRAIN NATRIURETIC PEPTIDE: B NATRIURETIC PEPTIDE 5: 590.7 pg/mL — AB (ref 0.0–100.0)

## 2015-05-23 MED ORDER — TICAGRELOR 90 MG PO TABS: 90.0000 mg | ORAL_TABLET | Freq: Two times a day (BID) | ORAL | Status: DC

## 2015-05-23 MED ORDER — LOSARTAN POTASSIUM 25 MG PO TABS: 25.0000 mg | ORAL_TABLET | Freq: Every day | ORAL | Status: DC

## 2015-05-23 MED ORDER — TICAGRELOR 90 MG PO TABS
90.0000 mg | ORAL_TABLET | Freq: Two times a day (BID) | ORAL | Status: DC
Start: 2015-05-23 — End: 2015-05-23

## 2015-05-23 MED ORDER — ATORVASTATIN CALCIUM 80 MG PO TABS: 80.0000 mg | ORAL_TABLET | Freq: Every day | ORAL | Status: DC

## 2015-05-23 MED ORDER — NITROGLYCERIN 0.4 MG SL SUBL: 0.4000 mg | SUBLINGUAL_TABLET | SUBLINGUAL | Status: DC | PRN

## 2015-05-23 MED ORDER — METOPROLOL TARTRATE 25 MG PO TABS: 12.5000 mg | ORAL_TABLET | Freq: Two times a day (BID) | ORAL | Status: DC

## 2015-05-23 NOTE — Care Management Note (Addendum)
Case Management Note Donn Pierini RN, BSN Unit 2W-Case Manager [phone]  Patient Details  Name: Jon Baker MRN: [id number] Date of Birth: [birth year]  Subjective/Objective:   Pt admitted with STEMI- s/p cath- started on Brilinta                 Action/Plan: PTA pt lived at home with spouse- spoke with pt and wife at bedside- per conversation pt's wife thinks she already has Brilinta book with 30 day free card at home- will double check and let CM know if she needs 30 day free card for discharge- per benefits check- pt has $45 copay for 30 day supply - no pre-auth needed- and can use any in network pharmacy ([facility], [facility], [facility], [facility]) [facility]  (pt has part D with [facility]- ID- [phone])  Expected Discharge Date:      05/23/15            Expected Discharge Plan:  Home/Self Care  In-House Referral:     Discharge planning Services  CM Consult  Post Acute Care Choice:    Choice offered to:     DME Arranged:    DME Agency:     HH Arranged:    HH Agency:     Status of Service:  Completed, signed off  Medicare Important Message Given:  Yes Date Medicare IM Given:  05/23/15 Medicare IM give by:  Donn Pierini RN, BSN [phone] Date Additional Medicare IM Given:    Additional Medicare Important Message give by:     If discussed at Long Length of Stay Meetings, dates discussed:    Additional Comments:   05/23/15- f/u done with pt and wife- verified that wife has Brilinta book at home that has 30 day free card for use- [facility] [address] and verified that they have [email or web address] stock.   Darrold Span, RN 05/23/2015, 10:26 AM

## 2015-05-23 NOTE — Telephone Encounter (Signed)
Needs a TCM phone call.[name] Appt is 06/06/15 at 9am.. Thanks

## 2015-05-23 NOTE — Progress Notes (Signed)
CARDIAC REHAB PHASE I   PRE:  Rate/Rhythm: 60 SR     MODE:  Ambulation: 550 ft   POST:  Rate/Rhythm: 76 SR  BP:  Supine:   Sitting: 140/90  Standing:    SaO2: 97%RA 1155-1210 Up in room willing to walk. Walked 550 ft with hand held asst with steady gait. Stated he had walked earlier with granddaughter. Wants to go home. No CP.   Luetta Nutting, RN BSN  05/23/2015 12:06 PM

## 2015-05-23 NOTE — Progress Notes (Signed)
Subjective:  Feels well without chest pain or dyspnea.  Objective:   Vital Signs : Filed Vitals:   05/22/15 0533 05/22/15 1400 05/22/15 2100 05/23/15 0450  BP:  120/74 142/106 139/91  Pulse:  65  69  Temp:  97.5 F (36.4 C) 98.3 F (36.8 C) 97.9 F (36.6 C)  TempSrc:  Oral Oral Oral  Resp:  18  19  Height:      Weight: 204 lb 2.3 oz (92.6 kg)   206 lb 9.6 oz (93.713 kg)  SpO2:  98% 97% 96%    Intake/Output from previous day:  Intake/Output Summary (Last 24 hours) at 05/23/15 1233 Last data filed at 05/23/15 1030  Gross per 24 hour  Intake    600 ml  Output    400 ml  Net    200 ml    I/O since admission: -1635  Wt Readings from Last 3 Encounters:  05/23/15 206 lb 9.6 oz (93.713 kg)  03/31/15 207 lb (93.895 kg)  09/07/14 205 lb 12.8 oz (93.35 kg)    Medications: . aspirin EC  81 mg Oral Daily  . atorvastatin  80 mg Oral q1800  . heparin  5,000 Units Subcutaneous 3 times per day  . losartan  25 mg Oral Daily  . metoprolol tartrate  12.5 mg Oral BID  . ticagrelor  90 mg Oral BID       Physical Exam:   General appearance: alert, cooperative and no distress Neck: no adenopathy, no JVD, supple, symmetrical, trachea midline and thyroid not enlarged, symmetric, no tenderness/mass/nodules Lungs: clear to auscultation bilaterally Heart: regular rate and rhythm and 1/6 sem Abdomen: soft, non-tender; bowel sounds normal; no masses,  no organomegaly Extremities: no edema, redness or tenderness in the calves or thighs Pulses: 2+ and symmetric Neurologic: Grossly normal   Rate: 66  Rhythm: normal sinus rhythm  ECG (independently read by me):  Lab Results:   Recent Labs  05/21/15 0225 05/22/15 1220 05/23/15 0440  NA 133* 137 140  K 3.0* 3.5 3.6  CL 98* 103 107  CO2 $Re'25 25 24  'ogZ$ GLUCOSE 140* 93 92  BUN $Re'13 15 16  'Jnc$ CREATININE 0.98 0.97 0.95  CALCIUM 8.3* 9.0 8.8*  MG  --  2.4  --     Hepatic Function Latest Ref Rng 05/20/2015 03/31/2015 07/27/2013    Total Protein 6.5 - 8.1 g/dL 6.6 7.0 6.8  Albumin 3.5 - 5.0 g/dL 3.8 4.0 3.9  AST 15 - 41 U/L 41 28 24  ALT 17 - 63 U/L 34 30 22  Alk Phosphatase 38 - 126 U/L 74 86 68  Total Bilirubin 0.3 - 1.2 mg/dL 1.2 0.8 0.9  Bilirubin, Direct 0.0 - 0.3 mg/dL - 0.1 0.1     Recent Labs  05/21/15 0225 05/22/15 1220 05/23/15 0440  WBC 12.5* 9.9 9.6  HGB 15.5 16.3 15.7  HCT 42.5 44.9 44.2  MCV 89.1 90.2 90.6  PLT 254 259 259     Recent Labs  05/20/15 1726 05/20/15 2042 05/21/15 0225  TROPONINI 45.94* >65.00* 31.30*    Lab Results  Component Value Date   TSH 2.04 03/31/2015    Recent Labs  05/22/15 1220  HGBA1C 5.6     Recent Labs  05/20/15 1401  PROT 6.6  ALBUMIN 3.8  AST 41  ALT 34  ALKPHOS 74  BILITOT 1.2    Recent Labs  05/20/15 1401  INR 1.10   BNP (last 3 results)  Recent Labs  05/20/15 1401  05/23/15 0440  BNP 317.1* 590.7*    ProBNP (last 3 results) No results for input(s): PROBNP in the last 8760 hours.   Lipid Panel     Component Value Date/Time   CHOL 182 05/21/2015 0225   TRIG 152* 05/21/2015 0225   HDL 30* 05/21/2015 0225   CHOLHDL 6.1 05/21/2015 0225   VLDL 30 05/21/2015 0225   LDLCALC 122* 05/21/2015 0225    Imaging:  05/22/15 Echo: Study Conclusions  - Left ventricle: The cavity size was normal. Systolic function was moderately reduced. The estimated ejection fraction was in the range of 35% to 40%. There is akinesis of the apical septal myocardium. There is akinesis of the apical myocardium. There is akinesis of the midanteroseptal myocardium. There is akinesis of the apicalanterior myocardium. There was an increased relative contribution of atrial contraction to ventricular filling. Doppler parameters are consistent with abnormal left ventricular relaxation (grade 1 diastolic dysfunction). - Aorta: Aortic root dimension: 39 mm (ED). - Ascending aorta: The ascending aorta was mildly  dilated.  Assessment/Plan:   Principal Problem:   STEMI (ST elevation myocardial infarction) Active Problems:   Essential hypertension   COPD (chronic obstructive pulmonary disease)   Day 3 s/p Anterior STEMI with DES stent to LAD; medical therapy for concomitant CAD of Lcx and RCA. Echo reviewed, still with potential for additional recovery of some LV fxn.  Now on atorvastatin 80 mg tolerating for aggrsssive lipid therapy and potential for plaque regression.  Continue DAPT for a minimum of a year. DC today.   Troy Sine, MD, [facility] 05/23/2015, 12:33 PM

## 2015-05-23 NOTE — Discharge Summary (Signed)
CARDIOLOGY DISCHARGE SUMMARY   Patient ID: Jon Baker MRN: [id number] DOB/AGE: Oct 04, 1932 81 y.o.  Admit date: 05/20/2015 Discharge date: 05/23/2015  PCP: Walker Kehr, MD Primary Cardiologist: Dr Gwenlyn Found  Primary Discharge Diagnosis:  STEMI - s/p 3.0 x 28 mm Promus premier DES to the LAD to the LAD Secondary Discharge Diagnosis:    Essential hypertension   COPD (chronic obstructive pulmonary disease)   Hypokalemia   Dyslipidemia   Ischemic cardiomyopathy  Procedures: Cardiac catheterization, coronary arteriogram, left ventriculogram, PTCA and DES to the LAD  Hospital Course: Jon Baker is a 81 y.o. male with a history of hypertension, COPD and claudication symptoms (declined ABIs). He had chest pain and came to the hospital. In the emergency room, his ECG showed anterior ST elevation. His symptoms have improved but he was still having pain. He was taken directly to the Cath Lab.  Cardiac catheterization results are below. He had a 99% stenosis to his LAD that was treated with PTCA and a drug-eluting stent reducing the stenosis to 0. He had other nonobstructive disease. He also had an ischemic cardiomyopathy with an EF of 35%. He was admitted to CCU.  His potassium was noted to be low at 3.0 was supplemented. He was started on aspirin and Brilinta and tolerated the dual antiplatelets therapy well. He was started on a low dose of a beta blocker. He had been on losartan/HCTZ prior to admission for blood pressure control. Once he was further stabilized, the losartan was restarted, but the HCTZ is discontinued for now. He was started on high-dose statin. His LFTs were within normal limits and a lipid profile is below. A hemoglobin A1c was checked and was within normal limits.  He was seen by cardiac rehabilitation and educated on MI restrictions, heart-healthy lifestyle modifications, compliance with medications, and exercise guidelines. He is currently refusing outpatient  rehabilitation.  On 05/23/2015, he was seen by Dr. Claiborne Billings and all data were reviewed. He ambulated with cardiac rehabilitation and had no chest pain or shortness of breath. Dr. Claiborne Billings feels there is potential for recovery of some LV function. He is to continue high-dose statin therapy and dual antiplatelets therapy for a minimum of the year. No further inpatient workup is indicated and he is considered stable for discharge, to follow-up as an outpatient.  Labs:   Lab Results  Component Value Date   WBC 9.6 05/23/2015   HGB 15.7 05/23/2015   HCT 44.2 05/23/2015   MCV 90.6 05/23/2015   PLT 259 05/23/2015    Recent Labs Lab 05/20/15 1401  05/23/15 0440  NA 137  < > 140  K 3.2*  < > 3.6  CL 100*  < > 107  CO2 26  < > 24  BUN 18  < > 16  CREATININE 1.08  < > 0.95  CALCIUM 9.0  < > 8.8*  PROT 6.6  --   --   BILITOT 1.2  --   --   ALKPHOS 74  --   --   ALT 34  --   --   AST 41  --   --   GLUCOSE 131*  < > 92  < > = values in this interval not displayed.  Recent Labs  05/20/15 1726 05/20/15 2042 05/21/15 0225  TROPONINI 45.94* >65.00* 31.30*   Lipid Panel     Component Value Date/Time   CHOL 182 05/21/2015 0225   TRIG 152* 05/21/2015 0225   HDL 30* 05/21/2015 0225  CHOLHDL 6.1 05/21/2015 0225   VLDL 30 05/21/2015 0225   LDLCALC 122* 05/21/2015 0225    B NATRIURETIC PEPTIDE  Date/Time Value Ref Range Status  05/23/2015 04:40 AM 590.7* 0.0 - 100.0 pg/mL Final  05/20/2015 02:01 PM 317.1* 0.0 - 100.0 pg/mL Final    Recent Labs  05/20/15 1401  INR 1.10   Lab Results  Component Value Date   HGBA1C 5.6 05/22/2015      Radiology: Dg Chest Port 1 View  05/20/2015   CLINICAL DATA:  Code STEMI  EXAM: PORTABLE CHEST - 1 VIEW  COMPARISON:  06/04/2013  FINDINGS: The heart size and mediastinal contours are within normal limits. Both lungs are clear. The visualized skeletal structures are unremarkable.  IMPRESSION: No active disease.   Electronically Signed   By: Kathreen Devoid   On: 05/20/2015 15:02    Cardiac Cath: 05/19/2013 Conclusion    FINAL CONCLUSIONS:  TOTAL OCCLUSION OF THE PROXIMAL LAD TREATED SUCCESSFULLY WITH PRIMARY PCI USING A DRUG-ELUTING STENT WITH ADJUNCTIVE BALLOON ANGIOPLASTY AND ASPIRATION THROMBECTOMY  NONOBSTRUCTIVE LCX AND RCA STENOSES  MODERATELY SEVERE SEGMENTAL LV SYSTOLIC DYSFUNCTION. The LVEF is estimated at 35% with akinesis of the mid-anterior wall, LV apex, and inferoapex.  RECOMMENDATIONS:  CCU X 24 HOURS IF NO COMPLICATIONS  CHECK 2D ECHO MONDAY (ALLOW AT LEAST 24 HOURS FOR EARLY LV RECOVERY)  ASA/BRILINTA AT LEAST 12 MONTHS    Coronary Findings    Dominance: Right   Left Main  The vessel exhibits minimal luminal irregularities.     Left Anterior Descending   . Prox LAD lesion, 99% stenosed. calcified, with heavy thrombus . The lesion was not previously treated.   . Angioplasty: Pre-stent angioplasty was performed. A drug-eluting stent was placed. A post-stent angioplasty was not performed. Maximum pressure: 14 atm. The pre-interventional distal flow is decreased (TIMI 1). The post-interventional distal flow is normal (TIMI 3). The intervention was successful. No complications occured at this lesion. IC nitroglycerin was given. There is 99% subtotal occlusion of the proximal LAD with TIMI 1 flow. The patient is treated with Angiomax, oral loading with brilinta 180 mg, and a single bolus and Aggrastat on the table. A cougar wire is initially attempted to cross the lesion. However, there is severe tortuosity at the LAD origin and I was unable to direct a wire across the lesion. This created a non-system delay for primary PCI. The cougar wire was left in the diagonal branch and a whisper wire was used to successfully cross the lesion. The vessel was predilated with a 2.5 mm balloon. Aspiration thrombectomy was performed. Ultimately the vessel was stented with a 3.0 x 28 mm Promus premier DES deployed at 14 atm. The stent  was not postdilated because there was a good step down off the distal LAD and the stent appeared well expanded. Because of heavy thrombus I was concerned that aggressive postdilatation would lead to distal embolization and no reflow.  . There is no residual stenosis post intervention.       Left Circumflex   . Mid Cx lesion, 50% stenosed. calcified, diffuse .     Right Coronary Artery  The vessel exhibits minimal luminal irregularities.   . Prox RCA lesion, 40% stenosed. calcified, diffuse .   [name] Right Posterior Descending Artery   The vessel is large in size.      EKG: 05/21/2015 Sinus rhythm, previously seen ST elevation has improved  Echo: 05/22/2015 Conclusions - Left ventricle: The cavity size was normal. Systolic  function was moderately reduced. The estimated ejection fraction was in the range of 35% to 40%. There is akinesis of the apical septal myocardium. There is akinesis of the apical myocardium. There is akinesis of the midanteroseptal myocardium. There is akinesis of the apicalanterior myocardium. There was an increased relative contribution of atrial contraction to ventricular filling. Doppler parameters are consistent with abnormal left ventricular relaxation (grade 1 diastolic dysfunction). - Aorta: Aortic root dimension: 39 mm (ED). - Ascending aorta: The ascending aorta was mildly dilated.  FOLLOW UP PLANS AND APPOINTMENTS No Known Allergies   Medication List    STOP taking these medications        losartan-hydrochlorothiazide 50-12.5 MG per tablet  Commonly known as:  HYZAAR      TAKE these medications        aspirin EC 81 MG tablet  Take 1 tablet (81 mg total) by mouth daily.     atorvastatin 80 MG tablet  Commonly known as:  LIPITOR  Take 1 tablet (80 mg total) by mouth daily at 6 PM.     cholecalciferol 1000 UNITS tablet  Commonly known as:  VITAMIN D  Take 1 tablet (1,000 Units total) by mouth daily.     losartan 25 MG  tablet  Commonly known as:  COZAAR  Take 1 tablet (25 mg total) by mouth daily.     metoprolol tartrate 25 MG tablet  Commonly known as:  LOPRESSOR  Take 0.5 tablets (12.5 mg total) by mouth 2 (two) times daily.     nitroGLYCERIN 0.4 MG SL tablet  Commonly known as:  NITROSTAT  Place 1 tablet (0.4 mg total) under the tongue every 5 (five) minutes as needed for chest pain.     ticagrelor 90 MG Tabs tablet  Commonly known as:  BRILINTA  Take 1 tablet (90 mg total) by mouth 2 (two) times daily.        Discharge Instructions    Diet - low sodium heart healthy    Complete by:  As directed      Increase activity slowly    Complete by:  As directed           Follow-up Information    Follow up with Quay Burow, MD.   Specialties:  Cardiology, Radiology   Why:  The office will call.   Contact information:   [place] [address] [phone]       BRING ALL MEDICATIONS WITH YOU TO FOLLOW UP APPOINTMENTS  Time spent with patient to include physician time: 42 min Signed: Rosaria Ferries, PA-C 05/23/2015, 1:25 PM Co-Sign MD

## 2015-05-23 NOTE — Discharge Instructions (Signed)
PLEASE REMEMBER TO BRING ALL OF YOUR MEDICATIONS TO EACH OF YOUR FOLLOW-UP OFFICE VISITS. ° °PLEASE ATTEND ALL SCHEDULED FOLLOW-UP APPOINTMENTS.  ° °Activity: Increase activity slowly as tolerated. You may shower, but no soaking baths (or swimming) for 1 week. No driving for 2 days. No lifting over 5 lbs for 1 week. No sexual activity for 1 week.  ° °You May Return to Work: in 1 week (if applicable) ° °Wound Care: You may wash cath site gently with soap and water. Keep cath site clean and dry. If you notice pain, swelling, bleeding or pus at your cath site, please call [phone]. ° ° ° °Cardiac Cath Site Care °Refer to this sheet in the next few weeks. These instructions provide you with information on caring for yourself after your procedure. Your caregiver may also give you more specific instructions. Your treatment has been planned according to current medical practices, but problems sometimes occur. Call your caregiver if you have any problems or questions after your procedure. °HOME CARE INSTRUCTIONS °· You may shower 24 hours after the procedure. Remove the bandage (dressing) and gently wash the site with plain soap and water. Gently pat the site dry.  °· Do not apply powder or lotion to the site.  °· Do not sit in a bathtub, swimming pool, or whirlpool for 5 to 7 days.  °· No bending, squatting, or lifting anything over 10 pounds (4.5 kg) as directed by your caregiver.  °· Inspect the site at least twice daily.  °· Do not drive home if you are discharged the same day of the procedure. Have someone else drive you.  °· You may drive 24 hours after the procedure unless otherwise instructed by your caregiver.  °What to expect: °· Any bruising will usually fade within 1 to 2 weeks.  °· Blood that collects in the tissue (hematoma) may be painful to the touch. It should usually decrease in size and tenderness within 1 to 2 weeks.  °SEEK IMMEDIATE MEDICAL CARE IF: °· You have unusual pain at the site or down the  affected limb.  °· You have redness, warmth, swelling, or pain at the site.  °· You have drainage (other than a small amount of blood on the dressing).  °· You have chills.  °· You have a fever or persistent symptoms for more than 72 hours.  °· You have a fever and your symptoms suddenly get worse.  °· Your leg becomes pale, cool, tingly, or numb.  °· You have heavy bleeding from the site. Hold pressure on the site.  °Document Released: 12/28/2010 Document Revised: 11/14/2011 Document Reviewed: 12/28/2010 °Exit[employer]® Patient Information ©2012 [employer]. ° °

## 2015-05-24 ENCOUNTER — Telehealth: Payer: Self-pay | Admitting: *Deleted

## 2015-05-24 NOTE — Telephone Encounter (Signed)
Pt was on tcm list d/c 05/23/15 had cardiac Catheterization pt will f/u with Dr. Allyson Sabal...Jon Baker

## 2015-06-02 NOTE — Telephone Encounter (Signed)
Spoke with patient's wife, patient was unavailable. Patient contacted regarding discharge from [facility] on 05/23/15.  Patient understands to follow up with provider Dr Allyson Sabal on 06/06/15 at 9:00a at [facility] Patient understands discharge instructions? yes Patient understands medications and regiment? yes Patient understands to bring all medications to this visit? yes

## 2015-06-05 ENCOUNTER — Encounter: Payer: Self-pay | Admitting: Physician Assistant

## 2015-06-05 DIAGNOSIS — E785 Hyperlipidemia, unspecified: Secondary | ICD-10-CM | POA: Insufficient documentation

## 2015-06-05 DIAGNOSIS — I255 Ischemic cardiomyopathy: Secondary | ICD-10-CM | POA: Insufficient documentation

## 2015-06-05 DIAGNOSIS — I2510 Atherosclerotic heart disease of native coronary artery without angina pectoris: Secondary | ICD-10-CM | POA: Insufficient documentation

## 2015-06-05 NOTE — Progress Notes (Addendum)
Cardiology Office Note Date:  06/06/2015  Patient ID:  Jon Baker, Jon Baker 02-02-1932, MRN [id number] PCP:  Sonda Primes, MD  Cardiologist:  Allyson Sabal  Chief Complaint: follow-up STEMI  History of Present Illness: Jon Baker is a 81 y.o. male with history of HTN, COPD, dyslipidemia, claudication (previously declined ABIs), RBBB and recently diagnosed CAD with anterior STEMI s/p PCI to LAD who presents for post-hospital follow-up. He had recently presented to the ER with chest pain and anterior ST elevation. Cardiac cath was notable for 99% LAD that was treated with DES (otherwise nonobstructive disease). EF was 35%. He was started on aspirin, Brilinta, and high dose statin. 2D echo showed EF 35-40% with +WMA, grade 1 dd, mildly dilated ascending aorta. He did have hypokalemia down to 3.0 requiring repletion (Mg was 2.4).  He comes in today for post-hospital visit. He says overall he is doing well without any further chest pain. He is gradually getting back into activity. He has declined cardiac rehab. He does report he gets somewhat more SOB now than before his MI when lying back - he has history of chronic orthopnea but says it's a little more noticeable now. He also reports that sometimes he will be just sitting in his recliner and feel SOB for a minute without any particular associated sx including CP, palpitations, diaphoresis, dizziness or nausea. It usually resolves on its own. Inhalers haven't really made a difference. There doesn't seem to be a relationship of timing to medication. He drinks coffee about 2 hours before Brilinta in the morning. He doesn't really get SOB with exertion. No LEE, weight gain, tachycardia, hypoxia, presyncope, syncope or bleeding reported.   Past Medical History  Diagnosis Date  . DJD (degenerative joint disease)   . HTN (hypertension)   . Claudication     a. 2009, saw  cardiology, declined ABIs.  [name] COPD (chronic obstructive pulmonary disease)     states  he has been told by MD he has copd  . Pancreatitis, gallstone 05/2013  . STEMI (ST elevation myocardial infarction) 05/20/15    3.0 x 28 mm Promus premier DES to the LAD  . Dyslipidemia 2016  . Ischemic cardiomyopathy     a. 05/2015: EF 35% at cath, 35-40% by echo.  [name] CAD (coronary artery disease)     a. STEMI 05/2015 s/p DES to LAD.  [name] RBBB     Past Surgical History  Procedure Laterality Date  . Myleogram  1979  . Total knee arthroplasty Right 02-2011    Dr Despina Hick  . Glanglian cyst removal Left 1970's  . Cholecystectomy N/A 06/06/2013    Procedure: LAPAROSCOPIC CHOLECYSTECTOMY WITH INTRAOPERATIVE CHOLANGIOGRAM;  Surgeon: Liz Malady, MD;  Location: [facility] OR;  Service: General;  Laterality: N/A;  . Cardiac catheterization N/A 05/20/2015    Procedure: Left Heart Cath and Coronary Angiography;  Surgeon: [name], MD; LAD 99%, circumflex 50%, RCA 40%, EF 35% with akinesis in the mid anterior wall apex and inferior apex   . Cardiac catheterization N/A 05/20/2015    Procedure: Coronary Stent Intervention;  Surgeon: [name], MD;  3.0 x 28 mm Promus premier DES to the LAD    Current Outpatient Prescriptions  Medication Sig Dispense Refill  . albuterol (PROVENTIL HFA;VENTOLIN HFA) 108 (90 BASE) MCG/ACT inhaler Inhale 2 puffs into the lungs every 6 (six) hours as needed for wheezing or shortness of breath.    [name] aspirin EC 81 MG tablet Take 1 tablet (81 mg total) by mouth daily.  100 tablet 3  . atorvastatin (LIPITOR) 80 MG tablet Take 1 tablet (80 mg total) by mouth daily at 6 PM. 30 tablet 11  . cholecalciferol (VITAMIN D) 1000 UNITS tablet Take 1,000 Units by mouth daily.    [name] losartan (COZAAR) 25 MG tablet Take 1 tablet (25 mg total) by mouth daily. 30 tablet 11  . metoprolol tartrate (LOPRESSOR) 25 MG tablet Take 0.5 tablets (12.5 mg total) by mouth 2 (two) times daily. 40 tablet 11  . nitroGLYCERIN (NITROSTAT) 0.4 MG SL tablet Place 1 tablet (0.4 mg total) under the tongue every 5  (five) minutes as needed for chest pain. 25 tablet 3  . ticagrelor (BRILINTA) 90 MG TABS tablet Take 1 tablet (90 mg total) by mouth 2 (two) times daily. 60 tablet 11   No current facility-administered medications for this visit.    Allergies:   Review of patient's allergies indicates no known allergies.   Social History:  The patient  reports that he quit smoking about 27 years ago. He has never used smokeless tobacco. He reports that he does not drink alcohol or use illicit drugs.   Family History:  The patient's family history includes Cancer in his sister; Coronary artery disease in his father and mother. There is no history of Diabetes.  ROS:  Please see the history of present illness.  All other systems are reviewed and otherwise negative.   PHYSICAL EXAM:  VS:  BP 122/86 mmHg  Pulse 56  Ht 5\' 10"  (1.778 m)  Wt 206 lb 3.2 oz (93.532 kg)  BMI 29.59 kg/m2  SpO2 98% on RA. BMI: Body mass index is 29.59 kg/(m^2). Well nourished, well developed WM in no acute distress HEENT: normocephalic, atraumatic Neck: no JVD, carotid bruits or masses Cardiac:  normal S1, S2; RRR; no murmurs, rubs, or gallops Lungs:  clear to auscultation bilaterally, no wheezing, rhonchi or rales Abd: soft, nontender, no hepatomegaly, + BS MS: no deformity or atrophy Ext: no edema. Right radial cath site without hematoma or ecchymosis, good pulse. Skin: warm and dry, no rash Neuro:  moves all extremities spontaneously, no focal abnormalities noted, follows commands Psych: euthymic mood, full affect  EKG:  Done today shows SB 56bpm, RBBB, Qtc , deep TWI in V2-V4 likely representative of evolution of recent MI, anteroseptal infarct, nonspecific changes otherwise. Reviewed with Dr. Katrinka Blazing.  Recent Labs: 03/31/2015: TSH 2.04 05/20/2015: ALT 34 05/22/2015: Magnesium 2.4 05/23/2015: B Natriuretic Peptide 590.7*; BUN 16; Creatinine, Ser 0.95; Hemoglobin 15.7; Platelets 259; Potassium 3.6; Sodium 140    05/21/2015: Cholesterol 182; HDL 30*; LDL Cholesterol 122*; Total CHOL/HDL Ratio 6.1; Triglycerides 152*; VLDL 30   Estimated Creatinine Clearance: 70 mL/min (by C-G formula based on Cr of 0.95).   Wt Readings from Last 3 Encounters:  06/06/15 206 lb 3.2 oz (93.532 kg)  05/23/15 206 lb 9.6 oz (93.713 kg)  03/31/15 207 lb (93.895 kg)     Other studies reviewed: Additional studies/records reviewed today include: summarized above  ASSESSMENT AND PLAN:  1. Dyspnea - he has chronic dyspnea and in the past has been told he has COPD (50 years former tobacco abuse). However, since MI, he has had some increase in orthopnea as well as random episodes of unprovoked dyspnea that do not appear exertional. I wonder if this is related to Brilinta. Lungs sound clear and weight is unchanged. Will check BMET and BNP today. If BNP is no longer elevated compared to hospital stay, will talk to Dr. Allyson Sabal about switching  to Effient. If BNP is up suggestive of possible volume overload, will consider addition of Lasix versus spironolactone given LV dysfunction and h/o hypokalemia. ADDENDUM:  I discussed with Dr. Allyson Sabal. Potassium level is much better. BNP is mildly elevated but less than what it was in the hospital. Since lungs were clear and weight is unchanged, Dr. Allyson Sabal feels like this is more likely to be related to Brilinta than CHF. He wants to switch the patient from Brilinta to Plavix. He recommends for the patient to take his last dose of Brilinta in the evening (then no further doses), then take Plavix 300mg  the next day, and then the following day after that, start Plavix 75mg  daily. He did not think the patient was a good candidate for Effient given his age. Patient should call us if symptoms don't improve with this switch. 2. CAD with recent anterior STEMI s/p PCI to LAD - see above regarding Brilinta. Continue low dose BB. Continue statin. 3. Ischemic cardiomyopathy/chronic systolic CHF - see above. Reviewed  daily weights, salt and fluid restriction. He is on metoprolol rather than carvedilol but has a history of COPD thus will continue. Continue ARB. Based on labs above, spironolactone may be considered for addition. Recheck echocardiogram in 3 months to reassess LV. 4. Essential hypertension - follow. 5. Dyslipidemia - f/u LFTs/lipids in 6-8 weeks.  Disposition: F/u with Dr. Allyson Sabal in 6-8 weeks.  Current medicines are reviewed at length with the patient today.  The patient did not have any concerns regarding medicines.  Thomasene Mohair PA-C 06/06/2015 9:50 AM     [facility] [address] [place] [address] [phone] (office)  [phone] (fax)

## 2015-06-06 ENCOUNTER — Encounter: Payer: Self-pay | Admitting: Physician Assistant

## 2015-06-06 ENCOUNTER — Ambulatory Visit (INDEPENDENT_AMBULATORY_CARE_PROVIDER_SITE_OTHER): Payer: Medicare Other | Admitting: Physician Assistant

## 2015-06-06 VITALS — BP 122/86 | HR 56 | Ht 70.0 in | Wt 206.2 lb

## 2015-06-06 DIAGNOSIS — I2510 Atherosclerotic heart disease of native coronary artery without angina pectoris: Secondary | ICD-10-CM | POA: Diagnosis not present

## 2015-06-06 DIAGNOSIS — Z9861 Coronary angioplasty status: Secondary | ICD-10-CM | POA: Diagnosis not present

## 2015-06-06 DIAGNOSIS — I5022 Chronic systolic (congestive) heart failure: Secondary | ICD-10-CM

## 2015-06-06 DIAGNOSIS — R0600 Dyspnea, unspecified: Secondary | ICD-10-CM

## 2015-06-06 DIAGNOSIS — I255 Ischemic cardiomyopathy: Secondary | ICD-10-CM

## 2015-06-06 DIAGNOSIS — I2109 ST elevation (STEMI) myocardial infarction involving other coronary artery of anterior wall: Secondary | ICD-10-CM | POA: Diagnosis not present

## 2015-06-06 DIAGNOSIS — E785 Hyperlipidemia, unspecified: Secondary | ICD-10-CM

## 2015-06-06 DIAGNOSIS — I10 Essential (primary) hypertension: Secondary | ICD-10-CM | POA: Diagnosis not present

## 2015-06-06 LAB — BASIC METABOLIC PANEL
BUN: 16 mg/dL (ref 6–23)
CALCIUM: 9.2 mg/dL (ref 8.4–10.5)
CO2: 26 meq/L (ref 19–32)
CREATININE: 0.96 mg/dL (ref 0.40–1.50)
Chloride: 102 mEq/L (ref 96–112)
GFR: 79.88 mL/min (ref >60.00)
Glucose, Bld: 108 mg/dL — ABNORMAL HIGH (ref 70–99)
Potassium: 3.9 mEq/L (ref 3.5–5.1)
Sodium: 136 mEq/L (ref 135–145)

## 2015-06-06 LAB — BRAIN NATRIURETIC PEPTIDE: PRO B NATRI PEPTIDE: 299 pg/mL — AB (ref 0.0–100.0)

## 2015-06-06 NOTE — Patient Instructions (Signed)
Medication Instructions:  None  Labwork: BMET and BNP today  Your physician recommends that you return for lab work in: 6-8 weeks (CMET, Lipids)  Testing/Procedures: Your physician has requested that you have an echocardiogram in 3 months. Echocardiography is a painless test that uses sound waves to create images of your heart. It provides your doctor with information about the size and shape of your heart and how well your heart's chambers and valves are working. This procedure takes approximately one hour. There are no restrictions for this procedure.   Follow-Up: Your physician recommends that you schedule a follow-up appointment in: 6-8 weeks with Dr. Allyson Sabal.   Any Other Special Instructions Will Be Listed Below (If Applicable).

## 2015-06-09 ENCOUNTER — Telehealth: Payer: Self-pay | Admitting: Cardiovascular Disease

## 2015-06-09 MED ORDER — CLOPIDOGREL BISULFATE 75 MG PO TABS: 75.0000 mg | ORAL_TABLET | Freq: Every day | ORAL | Status: DC

## 2015-06-09 NOTE — Telephone Encounter (Signed)
Called patient back with message written below. Medication has been ordered. Patient verbalized understanding of instructions.  Notes Recorded by Laurann Montana, PA-C on 06/06/2015 at 3:40 PM Please call patient. I discussed with Dr. Allyson Sabal. Potassium level is much better. BNP is mildly elevated but less than what it was in the hospital. Since lungs were clear and weight is unchanged, Dr. Allyson Sabal feels like this is more likely to be related to Brilinta than CHF. He wants to switch the patient from Brilinta to Plavix. He recommends for the patient to take his last dose of Brilinta in the evening (then no further doses), then take Plavix 300mg  the next day, and then the following day after that, start Plavix 75mg  daily. He did not think the patient was a good candidate for Effient given his age. Patient should call us if symptoms don't improve with this switch. The patient should also follow up with primary care doctor for mildly elevated blood sugars. Recent A1C was normal but he may have some glucose intolerance

## 2015-06-09 NOTE — Telephone Encounter (Signed)
Pt says he is returning a call from Wednesday and Thursday,he does not know who called.

## 2015-06-09 NOTE — Telephone Encounter (Signed)
Called patient back. Informed patient that the only change is discontinuing the Brilinta and starting the Plavix. Patient verbalized understanding.

## 2015-06-09 NOTE — Telephone Encounter (Signed)
New problem   Pt need to speak to [facility] concerning previous conversation she had with you. Pt want to know if pt need to continue taking the aspirin and other meds that was prescribe.

## 2015-06-09 NOTE — Telephone Encounter (Signed)
New message ° ° ° ° °Pt returning Pam's call regarding results.  Please call to advise °

## 2015-06-19 DIAGNOSIS — H25099 Other age-related incipient cataract, unspecified eye: Secondary | ICD-10-CM | POA: Diagnosis not present

## 2015-06-19 DIAGNOSIS — I10 Essential (primary) hypertension: Secondary | ICD-10-CM | POA: Diagnosis not present

## 2015-06-28 ENCOUNTER — Encounter: Payer: Self-pay | Admitting: Internal Medicine

## 2015-06-28 ENCOUNTER — Ambulatory Visit (INDEPENDENT_AMBULATORY_CARE_PROVIDER_SITE_OTHER): Payer: Medicare Other | Admitting: Internal Medicine

## 2015-06-28 VITALS — BP 150/88 | HR 62 | Temp 97.5°F | Resp 16 | Ht 70.5 in | Wt 208.1 lb

## 2015-06-28 DIAGNOSIS — N320 Bladder-neck obstruction: Secondary | ICD-10-CM

## 2015-06-28 DIAGNOSIS — J301 Allergic rhinitis due to pollen: Secondary | ICD-10-CM

## 2015-06-28 DIAGNOSIS — J309 Allergic rhinitis, unspecified: Secondary | ICD-10-CM | POA: Insufficient documentation

## 2015-06-28 DIAGNOSIS — Z0000 Encounter for general adult medical examination without abnormal findings: Secondary | ICD-10-CM | POA: Diagnosis not present

## 2015-06-28 DIAGNOSIS — J449 Chronic obstructive pulmonary disease, unspecified: Secondary | ICD-10-CM

## 2015-06-28 DIAGNOSIS — I2101 ST elevation (STEMI) myocardial infarction involving left main coronary artery: Secondary | ICD-10-CM

## 2015-06-28 MED ORDER — TERAZOSIN HCL 5 MG PO CAPS: 5.0000 mg | ORAL_CAPSULE | Freq: Every day | ORAL | Status: DC

## 2015-06-28 MED ORDER — LORATADINE 10 MG PO TABS: 10.0000 mg | ORAL_TABLET | Freq: Every day | ORAL | Status: DC

## 2015-06-28 NOTE — Progress Notes (Signed)
Pre visit review using our clinic review tool, if applicable. No additional management support is needed unless otherwise documented below in the visit note. 

## 2015-06-28 NOTE — Assessment & Plan Note (Signed)

## 2015-06-28 NOTE — Progress Notes (Signed)
Subjective:  Patient ID: Jon Baker, male    DOB: November 12, 1932  Age: 81 y.o. MRN: [id number]  CC: No chief complaint on file.   HPI   [name] presents for a well exam. F/u CAD. C/o frequent urination at night  Jon Baker is a 81 y.o. male with history of HTN, COPD, dyslipidemia, claudication (previously declined ABIs), RBBB and recently diagnosed CAD with anterior STEMI s/p PCI to LAD. He had recently presented to the ER with chest pain and anterior ST elevation. Cardiac cath was notable for 99% LAD that was treated with DES (otherwise nonobstructive disease). EF was 35%. He was started on aspirin, Brilinta, and high dose statin. 2D echo showed EF 35-40% with +WMA, grade 1 dd, mildly dilated ascending aorta.    Past Medical History  Diagnosis Date  . DJD (degenerative joint disease)   . HTN (hypertension)   . Claudication     a. 2009, saw  cardiology, declined ABIs.  [name] COPD (chronic obstructive pulmonary disease)     states he has been told by MD he has copd  . Pancreatitis, gallstone 05/2013  . STEMI (ST elevation myocardial infarction) 05/20/15    3.0 x 28 mm Promus premier DES to the LAD  . Dyslipidemia 2016  . Ischemic cardiomyopathy     a. 05/2015: EF 35% at cath, 35-40% by echo.  [name] CAD (coronary artery disease)     a. STEMI 05/2015 s/p DES to LAD.  [name] RBBB    Past Surgical History  Procedure Laterality Date  . Myleogram  1979  . Total knee arthroplasty Right 02-2011    Dr Despina Hick  . Glanglian cyst removal Left 1970's  . Cholecystectomy N/A 06/06/2013    Procedure: LAPAROSCOPIC CHOLECYSTECTOMY WITH INTRAOPERATIVE CHOLANGIOGRAM;  Surgeon: Liz Malady, MD;  Location: [facility] OR;  Service: General;  Laterality: N/A;  . Cardiac catheterization N/A 05/20/2015    Procedure: Left Heart Cath and Coronary Angiography;  Surgeon: [name], MD; LAD 99%, circumflex 50%, RCA 40%, EF 35% with akinesis in the mid anterior wall apex and inferior apex   . Cardiac  catheterization N/A 05/20/2015    Procedure: Coronary Stent Intervention;  Surgeon: [name], MD;  3.0 x 28 mm Promus premier DES to the LAD    reports that he quit smoking about 27 years ago. He has never used smokeless tobacco. He reports that he does not drink alcohol or use illicit drugs. family history includes Cancer in his sister; Coronary artery disease in his father and mother. There is no history of Diabetes. No Known Allergies   Outpatient Prescriptions Prior to Visit  Medication Sig Dispense Refill  . albuterol (PROVENTIL HFA;VENTOLIN HFA) 108 (90 BASE) MCG/ACT inhaler Inhale 2 puffs into the lungs every 6 (six) hours as needed for wheezing or shortness of breath.    [name] aspirin EC 81 MG tablet Take 1 tablet (81 mg total) by mouth daily. 100 tablet 3  . atorvastatin (LIPITOR) 80 MG tablet Take 1 tablet (80 mg total) by mouth daily at 6 PM. 30 tablet 11  . cholecalciferol (VITAMIN D) 1000 UNITS tablet Take 1,000 Units by mouth daily.    . clopidogrel (PLAVIX) 75 MG tablet Take 1 tablet (75 mg total) by mouth daily. 06/09/2015 take 300 mg by mouth for first dose only. 90 tablet 3  . losartan (COZAAR) 25 MG tablet Take 1 tablet (25 mg total) by mouth daily. 30 tablet 11  . metoprolol tartrate (LOPRESSOR) 25 MG tablet Take  0.5 tablets (12.5 mg total) by mouth 2 (two) times daily. 40 tablet 11  . nitroGLYCERIN (NITROSTAT) 0.4 MG SL tablet Place 1 tablet (0.4 mg total) under the tongue every 5 (five) minutes as needed for chest pain. 25 tablet 3   No facility-administered medications prior to visit.    ROS Review of Systems  Constitutional: Negative for appetite change, fatigue and unexpected weight change.  HENT: Negative for congestion, nosebleeds, sneezing, sore throat and trouble swallowing.   Eyes: Negative for itching and visual disturbance.  Respiratory: Negative for cough, chest tightness, shortness of breath and wheezing.   Cardiovascular: Negative for chest pain,  palpitations and leg swelling.  Gastrointestinal: Negative for nausea, diarrhea, blood in stool and abdominal distention.  Genitourinary: Negative for frequency and hematuria.  Musculoskeletal: Positive for arthralgias. Negative for back pain, joint swelling, gait problem and neck pain.  Skin: Negative for rash and wound.  Neurological: Negative for dizziness, tremors, speech difficulty and weakness.  Psychiatric/Behavioral: Negative for sleep disturbance, dysphoric mood and agitation. The patient is not nervous/anxious.     Objective:  BP 150/88 mmHg  Pulse 62  Temp(Src) 97.5 F (36.4 C) (Oral)  Resp 16  Ht 5' 10.5" (1.791 m)  Wt 208 lb 1.9 oz (94.403 kg)  BMI 29.43 kg/m2  SpO2 96%  BP Readings from Last 3 Encounters:  06/28/15 150/88  06/06/15 122/86  05/23/15 139/91    Wt Readings from Last 3 Encounters:  06/28/15 208 lb 1.9 oz (94.403 kg)  06/06/15 206 lb 3.2 oz (93.532 kg)  05/23/15 206 lb 9.6 oz (93.713 kg)    Physical Exam  Constitutional: He is oriented to person, place, and time. He appears well-developed. No distress.  NAD  HENT:  Mouth/Throat: Oropharynx is clear and moist.  Eyes: Conjunctivae are normal. Pupils are equal, round, and reactive to light.  Neck: Normal range of motion. No JVD present. No thyromegaly present.  Cardiovascular: Normal rate, regular rhythm, normal heart sounds and intact distal pulses.  Exam reveals no gallop and no friction rub.   No murmur heard. Pulmonary/Chest: Effort normal and breath sounds normal. No respiratory distress. He has no wheezes. He has no rales. He exhibits no tenderness.  Abdominal: Soft. Bowel sounds are normal. He exhibits no distension and no mass. There is no tenderness. There is no rebound and no guarding.  Musculoskeletal: Normal range of motion. He exhibits no edema or tenderness.  Lymphadenopathy:    He has no cervical adenopathy.  Neurological: He is alert and oriented to person, place, and time. He has  normal reflexes. No cranial nerve deficit. He exhibits normal muscle tone. He displays a negative Romberg sign. Coordination and gait normal.  Skin: Skin is warm and dry. No rash noted.  Psychiatric: He has a normal mood and affect. His behavior is normal. Judgment and thought content normal.    Lab Results  Component Value Date   WBC 9.6 05/23/2015   HGB 15.7 05/23/2015   HCT 44.2 05/23/2015   PLT 259 05/23/2015   GLUCOSE 108* 06/06/2015   CHOL 182 05/21/2015   TRIG 152* 05/21/2015   HDL 30* 05/21/2015   LDLCALC 122* 05/21/2015   ALT 34 05/20/2015   AST 41 05/20/2015   NA 136 06/06/2015   K 3.9 06/06/2015   CL 102 06/06/2015   CREATININE 0.96 06/06/2015   BUN 16 06/06/2015   CO2 26 06/06/2015   TSH 2.04 03/31/2015   PSA 4.51* 03/31/2015   INR 1.10 05/20/2015  HGBA1C 5.6 05/22/2015    Dg Chest Port 1 View  05/20/2015   CLINICAL DATA:  Code STEMI  EXAM: PORTABLE CHEST - 1 VIEW  COMPARISON:  06/04/2013  FINDINGS: The heart size and mediastinal contours are within normal limits. Both lungs are clear. The visualized skeletal structures are unremarkable.  IMPRESSION: No active disease.   Electronically Signed   By: Elige Ko   On: 05/20/2015 15:02    Assessment & Plan:   Diagnoses and all orders for this visit:  Well adult exam  Chronic obstructive pulmonary disease, unspecified COPD, unspecified chronic bronchitis type  ST elevation myocardial infarction involving left main coronary artery  Bladder neck obstruction  Allergic rhinitis due to pollen  Other orders -     terazosin (HYTRIN) 5 MG capsule; Take 1 capsule (5 mg total) by mouth at bedtime. -     loratadine (CLARITIN) 10 MG tablet; Take 1 tablet (10 mg total) by mouth daily.  I am having Mr. Moscato start on terazosin and loratadine. I am also having him maintain his aspirin EC, atorvastatin, losartan, metoprolol tartrate, nitroGLYCERIN, cholecalciferol, albuterol, and clopidogrel.  Meds ordered this  encounter  Medications  . terazosin (HYTRIN) 5 MG capsule    Sig: Take 1 capsule (5 mg total) by mouth at bedtime.    Dispense:  30 capsule    Refill:  11  . loratadine (CLARITIN) 10 MG tablet    Sig: Take 1 tablet (10 mg total) by mouth daily.    Dispense:  100 tablet    Refill:  3     Follow-up: Return in about 6 months (around 12/29/2015) for a follow-up visit.  Sonda Primes, MD

## 2015-06-28 NOTE — Addendum Note (Signed)
Addended by: [name] on: 06/28/2015 10:41 PM   Modules accepted: Level of Service

## 2015-06-28 NOTE — Assessment & Plan Note (Signed)
Chronic Flomax did not help 7/16 try Hytrin

## 2015-06-28 NOTE — Patient Instructions (Signed)
Preventive Care for Adults A healthy lifestyle and preventive care can promote health and wellness. Preventive health guidelines for men include the following key practices:  A routine yearly physical is a good way to check with your health care provider about your health and preventative screening. It is a chance to share any concerns and updates on your health and to receive a thorough exam.  Visit your dentist for a routine exam and preventative care every 6 months. Brush your teeth twice a day and floss once a day. Good oral hygiene prevents tooth decay and gum disease.  The frequency of eye exams is based on your age, health, family medical history, use of contact lenses, and other factors. Follow your health care provider's recommendations for frequency of eye exams.  Eat a healthy diet. Foods such as vegetables, fruits, whole grains, low-fat dairy products, and lean protein foods contain the nutrients you need without too many calories. Decrease your intake of foods high in solid fats, added sugars, and salt. Eat the right amount of calories for you.Get information about a proper diet from your health care provider, if necessary.  Regular physical exercise is one of the most important things you can do for your health. Most adults should get at least 150 minutes of moderate-intensity exercise (any activity that increases your heart rate and causes you to sweat) each week. In addition, most adults need muscle-strengthening exercises on 2 or more days a week.  Maintain a healthy weight. The body mass index (BMI) is a screening tool to identify possible weight problems. It provides an estimate of body fat based on height and weight. Your health care provider can find your BMI and can help you achieve or maintain a healthy weight.For adults 20 years and older:  A BMI below 18.5 is considered underweight.  A BMI of 18.5 to 24.9 is normal.  A BMI of 25 to 29.9 is considered overweight.  A BMI  of 30 and above is considered obese.  Maintain normal blood lipids and cholesterol levels by exercising and minimizing your intake of saturated fat. Eat a balanced diet with plenty of fruit and vegetables. Blood tests for lipids and cholesterol should begin at age 50 and be repeated every 5 years. If your lipid or cholesterol levels are high, you are over 50, or you are at high risk for heart disease, you may need your cholesterol levels checked more frequently.Ongoing high lipid and cholesterol levels should be treated with medicines if diet and exercise are not working.  If you smoke, find out from your health care provider how to quit. If you do not use tobacco, do not start.  Lung cancer screening is recommended for adults aged 73-80 years who are at high risk for developing lung cancer because of a history of smoking. A yearly low-dose CT scan of the lungs is recommended for people who have at least a 30-pack-year history of smoking and are a current smoker or have quit within the past 15 years. A pack year of smoking is smoking an average of 1 pack of cigarettes a day for 1 year (for example: 1 pack a day for 30 years or 2 packs a day for 15 years). Yearly screening should continue until the smoker has stopped smoking for at least 15 years. Yearly screening should be stopped for people who develop a health problem that would prevent them from having lung cancer treatment.  If you choose to drink alcohol, do not have more than  2 drinks per day. One drink is considered to be 12 ounces (355 mL) of beer, 5 ounces (148 mL) of wine, or 1.5 ounces (44 mL) of liquor.  Avoid use of street drugs. Do not share needles with anyone. Ask for help if you need support or instructions about stopping the use of drugs.  High blood pressure causes heart disease and increases the risk of stroke. Your blood pressure should be checked at least every 1-2 years. Ongoing high blood pressure should be treated with  medicines, if weight loss and exercise are not effective.  If you are 45-79 years old, ask your health care provider if you should take aspirin to prevent heart disease.  Diabetes screening involves taking a blood sample to check your fasting blood sugar level. This should be done once every 3 years, after age 45, if you are within normal weight and without risk factors for diabetes. Testing should be considered at a younger age or be carried out more frequently if you are overweight and have at least 1 risk factor for diabetes.  Colorectal cancer can be detected and often prevented. Most routine colorectal cancer screening begins at the age of 50 and continues through age 75. However, your health care provider may recommend screening at an earlier age if you have risk factors for colon cancer. On a yearly basis, your health care provider may provide home test kits to check for hidden blood in the stool. Use of a small camera at the end of a tube to directly examine the colon (sigmoidoscopy or colonoscopy) can detect the earliest forms of colorectal cancer. Talk to your health care provider about this at age 50, when routine screening begins. Direct exam of the colon should be repeated every 5-10 years through age 75, unless early forms of precancerous polyps or small growths are found.  People who are at an increased risk for hepatitis B should be screened for this virus. You are considered at high risk for hepatitis B if:  You were born in a country where hepatitis B occurs often. Talk with your health care provider about which countries are considered high risk.  Your parents were born in a high-risk country and you have not received a shot to protect against hepatitis B (hepatitis B vaccine).  You have HIV or AIDS.  You use needles to inject street drugs.  You live with, or have sex with, someone who has hepatitis B.  You are a man who has sex with other men (MSM).  You get hemodialysis  treatment.  You take certain medicines for conditions such as cancer, organ transplantation, and autoimmune conditions.  Hepatitis C blood testing is recommended for all people born from 1945 through 1965 and any individual with known risks for hepatitis C.  Practice safe sex. Use condoms and avoid high-risk sexual practices to reduce the spread of sexually transmitted infections (STIs). STIs include gonorrhea, chlamydia, syphilis, trichomonas, herpes, HPV, and human immunodeficiency virus (HIV). Herpes, HIV, and HPV are viral illnesses that have no cure. They can result in disability, cancer, and death.  If you are at risk of being infected with HIV, it is recommended that you take a prescription medicine daily to prevent HIV infection. This is called preexposure prophylaxis (PrEP). You are considered at risk if:  You are a man who has sex with other men (MSM) and have other risk factors.  You are a heterosexual man, are sexually active, and are at increased risk for HIV infection.    You take drugs by injection.  You are sexually active with a partner who has HIV.  Talk with your health care provider about whether you are at high risk of being infected with HIV. If you choose to begin PrEP, you should first be tested for HIV. You should then be tested every 3 months for as long as you are taking PrEP.  A one-time screening for abdominal aortic aneurysm (AAA) and surgical repair of large AAAs by ultrasound are recommended for men ages 32 to 67 years who are current or former smokers.  Healthy men should no longer receive prostate-specific antigen (PSA) blood tests as part of routine cancer screening. Talk with your health care provider about prostate cancer screening.  Testicular cancer screening is not recommended for adult males who have no symptoms. Screening includes self-exam, a health care provider exam, and other screening tests. Consult with your health care provider about any symptoms  you have or any concerns you have about testicular cancer.  Use sunscreen. Apply sunscreen liberally and repeatedly throughout the day. You should seek shade when your shadow is shorter than you. Protect yourself by wearing long sleeves, pants, a wide-brimmed hat, and sunglasses year round, whenever you are outdoors.  Once a month, do a whole-body skin exam, using a mirror to look at the skin on your back. Tell your health care provider about new moles, moles that have irregular borders, moles that are larger than a pencil eraser, or moles that have changed in shape or color.  Stay current with required vaccines (immunizations).  Influenza vaccine. All adults should be immunized every year.  Tetanus, diphtheria, and acellular pertussis (Td, Tdap) vaccine. An adult who has not previously received Tdap or who does not know his vaccine status should receive 1 dose of Tdap. This initial dose should be followed by tetanus and diphtheria toxoids (Td) booster doses every 10 years. Adults with an unknown or incomplete history of completing a 3-dose immunization series with Td-containing vaccines should begin or complete a primary immunization series including a Tdap dose. Adults should receive a Td booster every 10 years.  Varicella vaccine. An adult without evidence of immunity to varicella should receive 2 doses or a second dose if he has previously received 1 dose.  Human papillomavirus (HPV) vaccine. Males aged 68-21 years who have not received the vaccine previously should receive the 3-dose series. Males aged 22-26 years may be immunized. Immunization is recommended through the age of 6 years for any male who has sex with males and did not get any or all doses earlier. Immunization is recommended for any person with an immunocompromised condition through the age of 49 years if he did not get any or all doses earlier. During the 3-dose series, the second dose should be obtained 4-8 weeks after the first  dose. The third dose should be obtained 24 weeks after the first dose and 16 weeks after the second dose.  Zoster vaccine. One dose is recommended for adults aged 50 years or older unless certain conditions are present.  Measles, mumps, and rubella (MMR) vaccine. Adults born before [birth year] generally are considered immune to measles and mumps. Adults born in [birth year] or later should have 1 or more doses of MMR vaccine unless there is a contraindication to the vaccine or there is laboratory evidence of immunity to each of the three diseases. A routine second dose of MMR vaccine should be obtained at least 28 days after the first dose for students attending postsecondary  schools, health care workers, or international travelers. People who received inactivated measles vaccine or an unknown type of measles vaccine during 1963-1967 should receive 2 doses of MMR vaccine. People who received inactivated mumps vaccine or an unknown type of mumps vaccine before 1979 and are at high risk for mumps infection should consider immunization with 2 doses of MMR vaccine. Unvaccinated health care workers born before 1957 who lack laboratory evidence of measles, mumps, or rubella immunity or laboratory confirmation of disease should consider measles and mumps immunization with 2 doses of MMR vaccine or rubella immunization with 1 dose of MMR vaccine.  Pneumococcal 13-valent conjugate (PCV13) vaccine. When indicated, a person who is uncertain of his immunization history and has no record of immunization should receive the PCV13 vaccine. An adult aged 19 years or older who has certain medical conditions and has not been previously immunized should receive 1 dose of PCV13 vaccine. This PCV13 should be followed with a dose of pneumococcal polysaccharide (PPSV23) vaccine. The PPSV23 vaccine dose should be obtained at least 8 weeks after the dose of PCV13 vaccine. An adult aged 19 years or older who has certain medical conditions and  previously received 1 or more doses of PPSV23 vaccine should receive 1 dose of PCV13. The PCV13 vaccine dose should be obtained 1 or more years after the last PPSV23 vaccine dose.  Pneumococcal polysaccharide (PPSV23) vaccine. When PCV13 is also indicated, PCV13 should be obtained first. All adults aged 65 years and older should be immunized. An adult younger than age 65 years who has certain medical conditions should be immunized. Any person who resides in a nursing home or long-term care facility should be immunized. An adult smoker should be immunized. People with an immunocompromised condition and certain other conditions should receive both PCV13 and PPSV23 vaccines. People with human immunodeficiency virus (HIV) infection should be immunized as soon as possible after diagnosis. Immunization during chemotherapy or radiation therapy should be avoided. Routine use of PPSV23 vaccine is not recommended for American Indians, [place] Natives, or people younger than 65 years unless there are medical conditions that require PPSV23 vaccine. When indicated, people who have unknown immunization and have no record of immunization should receive PPSV23 vaccine. One-time revaccination 5 years after the first dose of PPSV23 is recommended for people aged 19-64 years who have chronic kidney failure, nephrotic syndrome, asplenia, or immunocompromised conditions. People who received 1-2 doses of PPSV23 before age 65 years should receive another dose of PPSV23 vaccine at age 65 years or later if at least 5 years have passed since the previous dose. Doses of PPSV23 are not needed for people immunized with PPSV23 at or after age 65 years.  Meningococcal vaccine. Adults with asplenia or persistent complement component deficiencies should receive 2 doses of quadrivalent meningococcal conjugate (MenACWY-D) vaccine. The doses should be obtained at least 2 months apart. Microbiologists working with certain meningococcal bacteria,  military recruits, people at risk during an outbreak, and people who travel to or live in countries with a high rate of meningitis should be immunized. A first-year college student up through age 21 years who is living in a residence hall should receive a dose if he did not receive a dose on or after his 16th birthday. Adults who have certain high-risk conditions should receive one or more doses of vaccine.  Hepatitis A vaccine. Adults who wish to be protected from this disease, have certain high-risk conditions, work with hepatitis A-infected animals, work in hepatitis A research labs, or   travel to or work in countries with a high rate of hepatitis A should be immunized. Adults who were previously unvaccinated and who anticipate close contact with an international adoptee during the first 60 days after arrival in the [place] from a country with a high rate of hepatitis A should be immunized.  Hepatitis B vaccine. Adults should be immunized if they wish to be protected from this disease, have certain high-risk conditions, may be exposed to blood or other infectious body fluids, are household contacts or sex partners of hepatitis B positive people, are clients or workers in certain care facilities, or travel to or work in countries with a high rate of hepatitis B.  Haemophilus influenzae type b (Hib) vaccine. A previously unvaccinated person with asplenia or sickle cell disease or having a scheduled splenectomy should receive 1 dose of Hib vaccine. Regardless of previous immunization, a recipient of a hematopoietic stem cell transplant should receive a 3-dose series 6-12 months after his successful transplant. Hib vaccine is not recommended for adults with HIV infection. Preventive Service / Frequency Ages 52 to 17  Blood pressure check.** / Every 1 to 2 years.  Lipid and cholesterol check.** / Every 5 years beginning at age 69.  Hepatitis C blood test.** / For any individual with known risks for  hepatitis C.  Skin self-exam. / Monthly.  Influenza vaccine. / Every year.  Tetanus, diphtheria, and acellular pertussis (Tdap, Td) vaccine.** / Consult your health care provider. 1 dose of Td every 10 years.  Varicella vaccine.** / Consult your health care provider.  HPV vaccine. / 3 doses over 6 months, if 72 or younger.  Measles, mumps, rubella (MMR) vaccine.** / You need at least 1 dose of MMR if you were born in 1957 or later. You may also need a second dose.  Pneumococcal 13-valent conjugate (PCV13) vaccine.** / Consult your health care provider.  Pneumococcal polysaccharide (PPSV23) vaccine.** / 1 to 2 doses if you smoke cigarettes or if you have certain conditions.  Meningococcal vaccine.** / 1 dose if you are age 35 to 60 years and a Market researcher living in a residence hall, or have one of several medical conditions. You may also need additional booster doses.  Hepatitis A vaccine.** / Consult your health care provider.  Hepatitis B vaccine.** / Consult your health care provider.  Haemophilus influenzae type b (Hib) vaccine.** / Consult your health care provider. Ages 35 to 8  Blood pressure check.** / Every 1 to 2 years.  Lipid and cholesterol check.** / Every 5 years beginning at age 57.  Lung cancer screening. / Every year if you are aged 44-80 years and have a 30-pack-year history of smoking and currently smoke or have quit within the past 15 years. Yearly screening is stopped once you have quit smoking for at least 15 years or develop a health problem that would prevent you from having lung cancer treatment.  Fecal occult blood test (FOBT) of stool. / Every year beginning at age 55 and continuing until age 73. You may not have to do this test if you get a colonoscopy every 10 years.  Flexible sigmoidoscopy** or colonoscopy.** / Every 5 years for a flexible sigmoidoscopy or every 10 years for a colonoscopy beginning at age 28 and continuing until age  1.  Hepatitis C blood test.** / For all people born from [birth year] through 1965 and any individual with known risks for hepatitis C.  Skin self-exam. / Monthly.  Influenza vaccine. / Every  year.  Tetanus, diphtheria, and acellular pertussis (Tdap/Td) vaccine.** / Consult your health care provider. 1 dose of Td every 10 years.  Varicella vaccine.** / Consult your health care provider.  Zoster vaccine.** / 1 dose for adults aged 53 years or older.  Measles, mumps, rubella (MMR) vaccine.** / You need at least 1 dose of MMR if you were born in 1957 or later. You may also need a second dose.  Pneumococcal 13-valent conjugate (PCV13) vaccine.** / Consult your health care provider.  Pneumococcal polysaccharide (PPSV23) vaccine.** / 1 to 2 doses if you smoke cigarettes or if you have certain conditions.  Meningococcal vaccine.** / Consult your health care provider.  Hepatitis A vaccine.** / Consult your health care provider.  Hepatitis B vaccine.** / Consult your health care provider.  Haemophilus influenzae type b (Hib) vaccine.** / Consult your health care provider. Ages 77 and over  Blood pressure check.** / Every 1 to 2 years.  Lipid and cholesterol check.**/ Every 5 years beginning at age 85.  Lung cancer screening. / Every year if you are aged 55-80 years and have a 30-pack-year history of smoking and currently smoke or have quit within the past 15 years. Yearly screening is stopped once you have quit smoking for at least 15 years or develop a health problem that would prevent you from having lung cancer treatment.  Fecal occult blood test (FOBT) of stool. / Every year beginning at age 33 and continuing until age 11. You may not have to do this test if you get a colonoscopy every 10 years.  Flexible sigmoidoscopy** or colonoscopy.** / Every 5 years for a flexible sigmoidoscopy or every 10 years for a colonoscopy beginning at age 28 and continuing until age 73.  Hepatitis C blood  test.** / For all people born from [birth year] through 1965 and any individual with known risks for hepatitis C.  Abdominal aortic aneurysm (AAA) screening.** / A one-time screening for ages 50 to 27 years who are current or former smokers.  Skin self-exam. / Monthly.  Influenza vaccine. / Every year.  Tetanus, diphtheria, and acellular pertussis (Tdap/Td) vaccine.** / 1 dose of Td every 10 years.  Varicella vaccine.** / Consult your health care provider.  Zoster vaccine.** / 1 dose for adults aged 34 years or older.  Pneumococcal 13-valent conjugate (PCV13) vaccine.** / Consult your health care provider.  Pneumococcal polysaccharide (PPSV23) vaccine.** / 1 dose for all adults aged 63 years and older.  Meningococcal vaccine.** / Consult your health care provider.  Hepatitis A vaccine.** / Consult your health care provider.  Hepatitis B vaccine.** / Consult your health care provider.  Haemophilus influenzae type b (Hib) vaccine.** / Consult your health care provider. **Family history and personal history of risk and conditions may change your health care provider's recommendations. Document Released: 01/21/2002 Document Revised: 11/30/2013 Document Reviewed: 04/22/2011 [facility] Patient Information 2015 [place], [place]. This information is not intended to replace advice given to you by your health care provider. Make sure you discuss any questions you have with your health care provider.

## 2015-06-28 NOTE — Assessment & Plan Note (Signed)
CAD with anterior STEMI s/p PCI to LAD. He had recently presented to the ER with chest pain and anterior ST elevation. Cardiac cath was notable for 99% LAD that was treated with DES (otherwise nonobstructive disease). EF was 35%. He was started on aspirin, Brilinta, and high dose statin. 2D echo showed EF 35-40% with +WMA, grade 1 dd, mildly dilated ascending aorta.

## 2015-06-28 NOTE — Assessment & Plan Note (Signed)
Chronic Loratidine 10 mg/d

## 2015-06-28 NOTE — Assessment & Plan Note (Signed)
Proair MDI 

## 2015-07-12 ENCOUNTER — Other Ambulatory Visit: Payer: Self-pay

## 2015-07-12 ENCOUNTER — Other Ambulatory Visit: Payer: Self-pay | Admitting: *Deleted

## 2015-07-12 MED ORDER — ATORVASTATIN CALCIUM 80 MG PO TABS: 80.0000 mg | ORAL_TABLET | Freq: Every day | ORAL | Status: DC

## 2015-07-20 ENCOUNTER — Other Ambulatory Visit: Payer: Self-pay | Admitting: *Deleted

## 2015-07-20 ENCOUNTER — Ambulatory Visit (INDEPENDENT_AMBULATORY_CARE_PROVIDER_SITE_OTHER): Payer: Medicare Other | Admitting: Cardiovascular Disease

## 2015-07-20 ENCOUNTER — Other Ambulatory Visit (INDEPENDENT_AMBULATORY_CARE_PROVIDER_SITE_OTHER): Payer: Medicare Other | Admitting: *Deleted

## 2015-07-20 ENCOUNTER — Encounter: Payer: Self-pay | Admitting: Cardiovascular Disease

## 2015-07-20 VITALS — BP 124/78 | HR 60 | Ht 70.5 in | Wt 212.7 lb

## 2015-07-20 DIAGNOSIS — E782 Mixed hyperlipidemia: Secondary | ICD-10-CM | POA: Diagnosis not present

## 2015-07-20 DIAGNOSIS — I10 Essential (primary) hypertension: Secondary | ICD-10-CM | POA: Diagnosis not present

## 2015-07-20 DIAGNOSIS — I2109 ST elevation (STEMI) myocardial infarction involving other coronary artery of anterior wall: Secondary | ICD-10-CM | POA: Diagnosis not present

## 2015-07-20 DIAGNOSIS — I255 Ischemic cardiomyopathy: Secondary | ICD-10-CM | POA: Diagnosis not present

## 2015-07-20 DIAGNOSIS — E785 Hyperlipidemia, unspecified: Secondary | ICD-10-CM

## 2015-07-20 DIAGNOSIS — R0600 Dyspnea, unspecified: Secondary | ICD-10-CM

## 2015-07-20 DIAGNOSIS — Z79899 Other long term (current) drug therapy: Secondary | ICD-10-CM

## 2015-07-20 LAB — LIPID PANEL
CHOLESTEROL: 125 mg/dL (ref 0–200)
HDL: 25.4 mg/dL — ABNORMAL LOW (ref >39.00)
LDL Cholesterol: 72 mg/dL (ref 0–99)
NonHDL: 99.5
TRIGLYCERIDES: 136 mg/dL (ref 0.0–149.0)
Total CHOL/HDL Ratio: 5
VLDL: 27.2 mg/dL (ref 0.0–40.0)

## 2015-07-20 LAB — COMPREHENSIVE METABOLIC PANEL
ALT: 39 U/L (ref 0–53)
AST: 46 U/L — AB (ref 0–37)
Albumin: 3.9 g/dL (ref 3.5–5.2)
Alkaline Phosphatase: 81 U/L (ref 39–117)
BUN: 20 mg/dL (ref 6–23)
CALCIUM: 9 mg/dL (ref 8.4–10.5)
CO2: 20 mEq/L (ref 19–32)
Chloride: 105 mEq/L (ref 96–112)
Creatinine, Ser: 1.03 mg/dL (ref 0.40–1.50)
GFR: 73.62 mL/min (ref >60.00)
GLUCOSE: 94 mg/dL (ref 70–99)
POTASSIUM: 4.5 meq/L (ref 3.5–5.1)
Sodium: 139 mEq/L (ref 135–145)
Total Bilirubin: 0.8 mg/dL (ref 0.2–1.2)
Total Protein: 6.9 g/dL (ref 6.0–8.3)

## 2015-07-20 NOTE — Progress Notes (Signed)
07/20/2015 Jon Baker   06/24/1932  [id number]  Primary Physician Sonda Primes, MD Primary Cardiologist: Runell Gess MD [name]   HPI:  Jon Baker is an 81 year old married Caucasian male, retired Neurosurgeon, with a history of hypertension and dyslipidemia as well as COPD followed by Dr. Jennette Banker cough. He was seen by myself in [facility] emergency room on 05/20/15 with an EKG that showed anterior ST segment elevation myocardial infarction. He was taken to the cardiac catheterization laboratory by Dr. Excell Seltzer where he had a 99% proximal LAD lesion. Went aspiration thrombectomy and stenting using a drug-eluting stent. Otherwise he had noncritical CAD with an anteroapical wall motion and LV, and ejection fraction of 35-40%. His Proventil was ultimately changed to Plavix because of symptoms. He does have COPD and dyspnea on exertion but denies chest pain.   Current Outpatient Prescriptions  Medication Sig Dispense Refill  . albuterol (PROVENTIL HFA;VENTOLIN HFA) 108 (90 BASE) MCG/ACT inhaler Inhale 2 puffs into the lungs every 6 (six) hours as needed for wheezing or shortness of breath.    [name] aspirin EC 81 MG tablet Take 1 tablet (81 mg total) by mouth daily. 100 tablet 3  . atorvastatin (LIPITOR) 80 MG tablet Take 1 tablet (80 mg total) by mouth daily at 6 PM. 90 tablet 0  . cholecalciferol (VITAMIN D) 1000 UNITS tablet Take 1,000 Units by mouth daily.    . clopidogrel (PLAVIX) 75 MG tablet Take 1 tablet (75 mg total) by mouth daily. 06/09/2015 take 300 mg by mouth for first dose only. 90 tablet 3  . loratadine (CLARITIN) 10 MG tablet Take 1 tablet (10 mg total) by mouth daily. 100 tablet 3  . losartan (COZAAR) 25 MG tablet Take 1 tablet (25 mg total) by mouth daily. 30 tablet 11  . metoprolol tartrate (LOPRESSOR) 25 MG tablet Take 0.5 tablets (12.5 mg total) by mouth 2 (two) times daily. 40 tablet 11  . nitroGLYCERIN (NITROSTAT) 0.4 MG SL tablet Place  1 tablet (0.4 mg total) under the tongue every 5 (five) minutes as needed for chest pain. 25 tablet 3  . terazosin (HYTRIN) 5 MG capsule Take 1 capsule (5 mg total) by mouth at bedtime. 30 capsule 11   No current facility-administered medications for this visit.    No Known Allergies  Social History   Social History  . Marital Status: Married    Spouse Name: N/A  . Number of Children: N/A  . Years of Education: N/A   Occupational History  . Not on file.   Social History Main Topics  . Smoking status: Former Smoker    Quit date: 12/10/1987  . Smokeless tobacco: Never Used  . Alcohol Use: No  . Drug Use: No  . Sexual Activity: Not on file   Other Topics Concern  . Not on file   Social History Narrative     Review of Systems: General: negative for chills, fever, night sweats or weight changes.  Cardiovascular: negative for chest pain, dyspnea on exertion, edema, orthopnea, palpitations, paroxysmal nocturnal dyspnea or shortness of breath Dermatological: negative for rash Respiratory: negative for cough or wheezing Urologic: negative for hematuria Abdominal: negative for nausea, vomiting, diarrhea, bright red blood per rectum, melena, or hematemesis Neurologic: negative for visual changes, syncope, or dizziness All other systems reviewed and are otherwise negative except as noted above.    Blood pressure 124/78, pulse 60, height 5' 10.5" (1.791 m), weight 212 lb 11.2 oz (96.48 kg).  General appearance: alert and no distress Neck: no adenopathy, no carotid bruit, no JVD, supple, symmetrical, trachea midline and thyroid not enlarged, symmetric, no tenderness/mass/nodules Lungs: clear to auscultation bilaterally Heart: regular rate and rhythm, S1, S2 normal, no murmur, click, rub or gallop Extremities: extremities normal, atraumatic, no cyanosis or edema  EKG not performed today  ASSESSMENT AND PLAN:   STEMI (ST elevation myocardial infarction) History of anterior  STEMI 05/20/15 with PCI and stenting of proximal LAD with a drug-eluting stent by Dr. Excell Seltzer. Right radial approach. He had minimal CAD otherwise with an EF in the 35% range. Follow-up 2-D echo performed 05/22/15 confirmed an ejection fraction of 35-40% with akinesia of the apical septum and hypokinesia of the anteroseptal myocardium. He has chronic shortness of breath but denies chest pain. He was changed from Dilantin to Plavix on 73 most likely because of shortness of breath. I'm going to get a P2 Y 12. Verify now test and obtain a 2-D echocardiogram next month for LV function. I will see him back in 3 months.  HYPERLIPIDEMIA History of hyperlipidemia currently on atorvastatin 80 mg with a recent cholesterol performed 07/20/15 revealing a total cholesterol 125, LDL 72 and HDL of 25.  Essential hypertension History of hypertension blood pressures measured at 124/78. He is on losartan and metoprolol. Continue current meds at current dosing      Runell Gess MD [facility], [facility] 07/20/2015 4:27 PM

## 2015-07-20 NOTE — Assessment & Plan Note (Signed)
History of anterior STEMI 05/20/15 with PCI and stenting of proximal LAD with a drug-eluting stent by Dr. Excell Seltzer. Right radial approach. He had minimal CAD otherwise with an EF in the 35% range. Follow-up 2-D echo performed 05/22/15 confirmed an ejection fraction of 35-40% with akinesia of the apical septum and hypokinesia of the anteroseptal myocardium. He has chronic shortness of breath but denies chest pain. He was changed from Dilantin to Plavix on 73 most likely because of shortness of breath. I'm going to get a P2 Y 12. Verify now test and obtain a 2-D echocardiogram next month for LV function. I will see him back in 3 months.

## 2015-07-20 NOTE — Addendum Note (Signed)
Addended by: Tonita Phoenix on: 07/20/2015 08:19 AM   Modules accepted: Orders

## 2015-07-20 NOTE — Assessment & Plan Note (Signed)
History of hyperlipidemia currently on atorvastatin 80 mg with a recent cholesterol performed 07/20/15 revealing a total cholesterol 125, LDL 72 and HDL of 25.

## 2015-07-20 NOTE — Addendum Note (Signed)
Addended by: Tonita Phoenix on: 07/20/2015 08:20 AM   Modules accepted: Orders

## 2015-07-20 NOTE — Patient Instructions (Signed)
  We will see you back in follow up in 3 months with Dr Allyson Sabal.   Dr Allyson Sabal has ordered: Have blood work done to check how well your Plavix is working. The blood work must be done at [facility].

## 2015-07-20 NOTE — Assessment & Plan Note (Signed)
History of hypertension blood pressures measured at 124/78. He is on losartan and metoprolol. Continue current meds at current dosing

## 2015-07-20 NOTE — Addendum Note (Signed)
Addended by: Debraann Livingstone K on: 07/20/2015 08:20 AM   Modules accepted: Orders  

## 2015-07-21 ENCOUNTER — Telehealth: Payer: Self-pay

## 2015-07-21 ENCOUNTER — Other Ambulatory Visit (HOSPITAL_COMMUNITY)
Admission: RE | Admit: 2015-07-21 | Discharge: 2015-07-21 | Disposition: A | Discharge status: Home / Self Care | Payer: Medicare Other | Source: Ambulatory Visit | Attending: Cardiovascular Disease | Admitting: Cardiovascular Disease

## 2015-07-21 DIAGNOSIS — E782 Mixed hyperlipidemia: Secondary | ICD-10-CM | POA: Insufficient documentation

## 2015-07-21 DIAGNOSIS — I10 Essential (primary) hypertension: Secondary | ICD-10-CM | POA: Diagnosis not present

## 2015-07-21 DIAGNOSIS — R899 Unspecified abnormal finding in specimens from other organs, systems and tissues: Secondary | ICD-10-CM

## 2015-07-21 LAB — PLATELET INHIBITION P2Y12: Platelet Function  P2Y12: 176 [PRU] — ABNORMAL LOW (ref 194–418)

## 2015-07-21 NOTE — Telephone Encounter (Signed)
Called patient about lab results. Per Ronie Spies PA, Cholesterol panel was already reviewed by Dr. Allyson Sabal - good improvement in LDL from 122->72 on atorvastatin. CMET looks mostly good except AST has minimally bumped. Please recheck LFTs only in about 1-2 weeks to make sure stable. Schedule and ordered LFTs for 08/01/2015. Patient agreed to date.

## 2015-07-24 ENCOUNTER — Telehealth: Payer: Self-pay | Admitting: Cardiovascular Disease

## 2015-07-24 NOTE — Telephone Encounter (Signed)
Patient is returning a call from [place].

## 2015-07-24 NOTE — Telephone Encounter (Signed)
Returned call to patient, instructed to continue Plavix per Dr. Hazle Coca note (P2Y12 test results).

## 2015-08-01 ENCOUNTER — Other Ambulatory Visit (INDEPENDENT_AMBULATORY_CARE_PROVIDER_SITE_OTHER): Payer: Medicare Other

## 2015-08-01 DIAGNOSIS — R899 Unspecified abnormal finding in specimens from other organs, systems and tissues: Secondary | ICD-10-CM

## 2015-08-01 LAB — HEPATIC FUNCTION PANEL
ALK PHOS: 82 U/L (ref 39–117)
ALT: 32 U/L (ref 0–53)
AST: 27 U/L (ref 0–37)
Albumin: 3.8 g/dL (ref 3.5–5.2)
BILIRUBIN DIRECT: 0.2 mg/dL (ref 0.0–0.3)
BILIRUBIN TOTAL: 1 mg/dL (ref 0.2–1.2)
Total Protein: 6.5 g/dL (ref 6.0–8.3)

## 2015-08-02 ENCOUNTER — Telehealth: Payer: Self-pay | Admitting: Physician Assistant

## 2015-08-02 NOTE — Telephone Encounter (Signed)
Pt and wife are aware of lab results. 

## 2015-08-02 NOTE — Telephone Encounter (Signed)
New message ° ° ° ° ° °Returning a call to get lab results °

## 2015-09-06 ENCOUNTER — Other Ambulatory Visit: Payer: Self-pay

## 2015-09-06 ENCOUNTER — Ambulatory Visit (HOSPITAL_COMMUNITY): Payer: Medicare Other | Attending: Cardiology

## 2015-09-06 DIAGNOSIS — I071 Rheumatic tricuspid insufficiency: Secondary | ICD-10-CM | POA: Diagnosis not present

## 2015-09-06 DIAGNOSIS — I350 Nonrheumatic aortic (valve) stenosis: Secondary | ICD-10-CM | POA: Diagnosis not present

## 2015-09-06 DIAGNOSIS — I255 Ischemic cardiomyopathy: Secondary | ICD-10-CM | POA: Insufficient documentation

## 2015-09-06 DIAGNOSIS — E785 Hyperlipidemia, unspecified: Secondary | ICD-10-CM | POA: Insufficient documentation

## 2015-09-06 DIAGNOSIS — I10 Essential (primary) hypertension: Secondary | ICD-10-CM | POA: Insufficient documentation

## 2015-09-07 ENCOUNTER — Encounter: Payer: Self-pay | Admitting: Physician Assistant

## 2015-09-07 ENCOUNTER — Telehealth: Payer: Self-pay | Admitting: Cardiovascular Disease

## 2015-09-07 NOTE — Telephone Encounter (Signed)
New Message  Pt calling concerning echo results. Please call back and discuss.

## 2015-09-07 NOTE — Telephone Encounter (Signed)
Patient did not understand the results of his echo.  Talked with wife who says she understood and was thankful for the re-explanation

## 2015-10-25 ENCOUNTER — Ambulatory Visit (INDEPENDENT_AMBULATORY_CARE_PROVIDER_SITE_OTHER): Payer: Medicare Other | Admitting: Cardiovascular Disease

## 2015-10-25 ENCOUNTER — Encounter: Payer: Self-pay | Admitting: Cardiovascular Disease

## 2015-10-25 VITALS — BP 132/90 | HR 51 | Ht 70.0 in | Wt 213.0 lb

## 2015-10-25 DIAGNOSIS — I10 Essential (primary) hypertension: Secondary | ICD-10-CM

## 2015-10-25 DIAGNOSIS — I255 Ischemic cardiomyopathy: Secondary | ICD-10-CM | POA: Diagnosis not present

## 2015-10-25 DIAGNOSIS — E782 Mixed hyperlipidemia: Secondary | ICD-10-CM

## 2015-10-25 DIAGNOSIS — I2109 ST elevation (STEMI) myocardial infarction involving other coronary artery of anterior wall: Secondary | ICD-10-CM | POA: Diagnosis not present

## 2015-10-25 NOTE — Assessment & Plan Note (Signed)
History of hypertension and blood pressure measured today at 130/90. He is on metoprolol and losartan. Continue current meds at current dosing

## 2015-10-25 NOTE — Assessment & Plan Note (Signed)
History of hyperlipidemia or atorvastatin 80 mg a day with lipid profile performed 07/20/15 revealing a total cholesterol 125, LDL 72 HDL 25

## 2015-10-25 NOTE — Patient Instructions (Signed)

## 2015-10-25 NOTE — Assessment & Plan Note (Signed)
Ejection fraction has increased from 35-40% in June up to normal in September.

## 2015-10-25 NOTE — Assessment & Plan Note (Signed)
History of CAD status post inferior STEMI 05/20/15 with cath performed by Dr. Excell Seltzer revealing 99% proximal LAD lesion. He had aspiration thrombectomy and drug-eluting stenting with noncritical CAD otherwise. He had an anteroapical wall motion abnormality with an ejection fraction of 35-40%. Because of shortness of breath his Esperanza Heir was transitioned to Plavix and his P2Y12 level was 176.

## 2015-10-25 NOTE — Progress Notes (Signed)
10/25/2015 [name]   24-Oct-1932  [id number]  Primary Physician Sonda Primes, MD Primary Cardiologist: Runell Gess MD [name]   HPI:  Mr. Haye is an 81 year old married Caucasian male, retired Retail buyer, with a history of hypertension and dyslipidemia as well as COPD followed by Dr. [name] . He was seen by myself in [facility] emergency room on 05/20/15 with an EKG that showed anterior ST segment elevation myocardial infarction. He was taken to the cardiac catheterization laboratory by Dr. Excell Seltzer where he had a 99% proximal LAD lesion. Went aspiration thrombectomy and stenting using a drug-eluting stent. Otherwise he had noncritical CAD with an anteroapical wall motion and LV, and ejection fraction of 35-40%. His Brilenta was ultimately changed to Plavix because of symptoms of shortness of breath.. He does have COPD and dyspnea on exertion but denies chest pain.his PT Y 12. Verify now platelet reactivity test was favorable at 176. Since I saw him. Once ago a repeat 2-D echocardiogram performed 08/29/15 revealed improvement in his ejection fraction up to 60% without wall motion abnormalities.   Current Outpatient Prescriptions  Medication Sig Dispense Refill  . albuterol (PROVENTIL HFA;VENTOLIN HFA) 108 (90 BASE) MCG/ACT inhaler Inhale 2 puffs into the lungs every 6 (six) hours as needed for wheezing or shortness of breath.    [name] aspirin EC 81 MG tablet Take 1 tablet (81 mg total) by mouth daily. 100 tablet 3  . atorvastatin (LIPITOR) 80 MG tablet Take 1 tablet (80 mg total) by mouth daily at 6 PM. 90 tablet 0  . cholecalciferol (VITAMIN D) 1000 UNITS tablet Take 1,000 Units by mouth daily.    . clopidogrel (PLAVIX) 75 MG tablet Take 1 tablet (75 mg total) by mouth daily. 06/09/2015 take 300 mg by mouth for first dose only. 90 tablet 3  . loratadine (CLARITIN) 10 MG tablet Take 1 tablet (10 mg total) by mouth daily. 100 tablet 3  . losartan  (COZAAR) 25 MG tablet Take 1 tablet (25 mg total) by mouth daily. 30 tablet 11  . metoprolol tartrate (LOPRESSOR) 25 MG tablet Take 0.5 tablets (12.5 mg total) by mouth 2 (two) times daily. 40 tablet 11  . nitroGLYCERIN (NITROSTAT) 0.4 MG SL tablet Place 1 tablet (0.4 mg total) under the tongue every 5 (five) minutes as needed for chest pain. 25 tablet 3   No current facility-administered medications for this visit.    No Known Allergies  Social History   Social History  . Marital Status: Married    Spouse Name: N/A  . Number of Children: N/A  . Years of Education: N/A   Occupational History  . Not on file.   Social History Main Topics  . Smoking status: Former Smoker    Quit date: 12/10/1987  . Smokeless tobacco: Never Used  . Alcohol Use: No  . Drug Use: No  . Sexual Activity: Not on file   Other Topics Concern  . Not on file   Social History Narrative     Review of Systems: General: negative for chills, fever, night sweats or weight changes.  Cardiovascular: negative for chest pain, dyspnea on exertion, edema, orthopnea, palpitations, paroxysmal nocturnal dyspnea or shortness of breath Dermatological: negative for rash Respiratory: negative for cough or wheezing Urologic: negative for hematuria Abdominal: negative for nausea, vomiting, diarrhea, bright red blood per rectum, melena, or hematemesis Neurologic: negative for visual changes, syncope, or dizziness All other systems reviewed and are otherwise negative except as noted above.  Blood pressure 132/90, pulse 51, height 5\' 10"  (1.778 m), weight 213 lb (96.616 kg).  General appearance: alert and no distress Neck: no adenopathy, no carotid bruit, no JVD, supple, symmetrical, trachea midline and thyroid not enlarged, symmetric, no tenderness/mass/nodules Lungs: clear to auscultation bilaterally Heart: regular rate and rhythm, S1, S2 normal, no murmur, click, rub or gallop Extremities: extremities normal,  atraumatic, no cyanosis or edema  EKG sinus bradycardia 51 with right bundle branch block and anterior Q waves. I personally reviewed this EKG  ASSESSMENT AND PLAN:   STEMI (ST elevation myocardial infarction) History of CAD status post inferior STEMI 05/20/15 with cath performed by Dr. Excell Seltzer revealing 99% proximal LAD lesion. He had aspiration thrombectomy and drug-eluting stenting with noncritical CAD otherwise. He had an anteroapical wall motion abnormality with an ejection fraction of 35-40%. Because of shortness of breath his Esperanza Heir was transitioned to Plavix and his P2Y12 level was 176.  Ischemic cardiomyopathy Ejection fraction has increased from 35-40% in June up to normal in September.  HYPERLIPIDEMIA History of hyperlipidemia or atorvastatin 80 mg a day with lipid profile performed 07/20/15 revealing a total cholesterol 125, LDL 72 HDL 25  Essential hypertension History of hypertension and blood pressure measured today at 130/90. He is on metoprolol and losartan. Continue current meds at current dosing      Runell Gess MD [facility], [facility] 10/25/2015 2:03 PM

## 2015-12-29 ENCOUNTER — Encounter: Payer: Self-pay | Admitting: Internal Medicine

## 2015-12-29 ENCOUNTER — Ambulatory Visit (INDEPENDENT_AMBULATORY_CARE_PROVIDER_SITE_OTHER): Payer: Medicare Other | Admitting: Internal Medicine

## 2015-12-29 VITALS — BP 118/64 | HR 57 | Wt 215.0 lb

## 2015-12-29 DIAGNOSIS — I10 Essential (primary) hypertension: Secondary | ICD-10-CM | POA: Diagnosis not present

## 2015-12-29 DIAGNOSIS — E782 Mixed hyperlipidemia: Secondary | ICD-10-CM

## 2015-12-29 DIAGNOSIS — R42 Dizziness and giddiness: Secondary | ICD-10-CM

## 2015-12-29 DIAGNOSIS — I2510 Atherosclerotic heart disease of native coronary artery without angina pectoris: Secondary | ICD-10-CM | POA: Diagnosis not present

## 2015-12-29 DIAGNOSIS — I255 Ischemic cardiomyopathy: Secondary | ICD-10-CM

## 2015-12-29 NOTE — Assessment & Plan Note (Signed)
Losartan, Lipitor, Plavix, Toprol, ASA

## 2015-12-29 NOTE — Assessment & Plan Note (Signed)
On Hyzaar, Toprol 

## 2015-12-29 NOTE — Assessment & Plan Note (Signed)
On Lipitor 

## 2015-12-29 NOTE — Progress Notes (Signed)
Pre visit review using our clinic review tool, if applicable. No additional management support is needed unless otherwise documented below in the visit note. 

## 2015-12-29 NOTE — Progress Notes (Signed)
Subjective:  Patient ID: Jon Baker, male    DOB: [birth year]  Age: 82 y.o. MRN: [id number]  CC: No chief complaint on file.   HPI Jon Baker presents for HTN, allergies, CAD f/u. Pt would like to be seen no more than once a year  Outpatient Prescriptions Prior to Visit  Medication Sig Dispense Refill  . albuterol (PROVENTIL HFA;VENTOLIN HFA) 108 (90 BASE) MCG/ACT inhaler Inhale 2 puffs into the lungs every 6 (six) hours as needed for wheezing or shortness of breath.    [name] aspirin EC 81 MG tablet Take 1 tablet (81 mg total) by mouth daily. 100 tablet 3  . atorvastatin (LIPITOR) 80 MG tablet Take 1 tablet (80 mg total) by mouth daily at 6 PM. 90 tablet 0  . cholecalciferol (VITAMIN D) 1000 UNITS tablet Take 1,000 Units by mouth daily.    . clopidogrel (PLAVIX) 75 MG tablet Take 1 tablet (75 mg total) by mouth daily. 06/09/2015 take 300 mg by mouth for first dose only. 90 tablet 3  . loratadine (CLARITIN) 10 MG tablet Take 1 tablet (10 mg total) by mouth daily. 100 tablet 3  . losartan (COZAAR) 25 MG tablet Take 1 tablet (25 mg total) by mouth daily. 30 tablet 11  . metoprolol tartrate (LOPRESSOR) 25 MG tablet Take 0.5 tablets (12.5 mg total) by mouth 2 (two) times daily. 40 tablet 11  . nitroGLYCERIN (NITROSTAT) 0.4 MG SL tablet Place 1 tablet (0.4 mg total) under the tongue every 5 (five) minutes as needed for chest pain. 25 tablet 3   No facility-administered medications prior to visit.    ROS Review of Systems  Constitutional: Negative for appetite change, fatigue and unexpected weight change.  HENT: Negative for congestion, nosebleeds, sneezing, sore throat and trouble swallowing.   Eyes: Negative for itching and visual disturbance.  Respiratory: Negative for cough.   Cardiovascular: Negative for chest pain, palpitations and leg swelling.  Gastrointestinal: Negative for nausea, diarrhea, blood in stool and abdominal distention.  Genitourinary: Negative for frequency and  hematuria.  Musculoskeletal: Negative for back pain, joint swelling, gait problem and neck pain.  Skin: Negative for rash.  Neurological: Negative for dizziness, tremors, speech difficulty and weakness.  Psychiatric/Behavioral: Negative for suicidal ideas, sleep disturbance, dysphoric mood and agitation. The patient is not nervous/anxious.     Objective:  BP 118/64 mmHg  Pulse 57  Wt 215 lb (97.523 kg)  SpO2 94%  BP Readings from Last 3 Encounters:  12/29/15 118/64  10/25/15 132/90  07/20/15 124/78    Wt Readings from Last 3 Encounters:  12/29/15 215 lb (97.523 kg)  10/25/15 213 lb (96.616 kg)  07/20/15 212 lb 11.2 oz (96.48 kg)    Physical Exam  Constitutional: He is oriented to person, place, and time. He appears well-developed. No distress.  NAD  HENT:  Mouth/Throat: Oropharynx is clear and moist.  Eyes: Conjunctivae are normal. Pupils are equal, round, and reactive to light.  Neck: Normal range of motion. No JVD present. No thyromegaly present.  Cardiovascular: Normal rate, regular rhythm, normal heart sounds and intact distal pulses.  Exam reveals no gallop and no friction rub.   No murmur heard. Pulmonary/Chest: Effort normal and breath sounds normal. No respiratory distress. He has no wheezes. He has no rales. He exhibits no tenderness.  Abdominal: Soft. Bowel sounds are normal. He exhibits no distension and no mass. There is no tenderness. There is no rebound and no guarding.  Musculoskeletal: Normal range of motion. He exhibits no  edema or tenderness.  Lymphadenopathy:    He has no cervical adenopathy.  Neurological: He is alert and oriented to person, place, and time. He has normal reflexes. No cranial nerve deficit. He exhibits normal muscle tone. He displays a negative Romberg sign. Coordination and gait normal.  Skin: Skin is warm and dry. No rash noted.  Psychiatric: He has a normal mood and affect. His behavior is normal. Judgment and thought content normal.     Lab Results  Component Value Date   WBC 9.6 05/23/2015   HGB 15.7 05/23/2015   HCT 44.2 05/23/2015   PLT 259 05/23/2015   GLUCOSE 94 07/20/2015   CHOL 125 07/20/2015   TRIG 136.0 07/20/2015   HDL 25.40* 07/20/2015   LDLCALC 72 07/20/2015   ALT 32 08/01/2015   AST 27 08/01/2015   NA 139 07/20/2015   K 4.5 07/20/2015   CL 105 07/20/2015   CREATININE 1.03 07/20/2015   BUN 20 07/20/2015   CO2 20 07/20/2015   TSH 2.04 03/31/2015   PSA 4.51* 03/31/2015   INR 1.10 05/20/2015   HGBA1C 5.6 05/22/2015    No results found.  Assessment & Plan:   Diagnoses and all orders for this visit:  Coronary artery disease involving native coronary artery of native heart without angina pectoris -     Basic metabolic panel; Future  Essential hypertension  Ischemic cardiomyopathy  HYPERLIPIDEMIA -     Basic metabolic panel; Future  DIZZINESS, "imbalance"  I am having Jon Baker maintain his aspirin EC, losartan, metoprolol tartrate, nitroGLYCERIN, cholecalciferol, albuterol, clopidogrel, loratadine, and atorvastatin.  No orders of the defined types were placed in this encounter.     Follow-up: Return in about 1 year (around 12/28/2016) for Wellness Exam.  Sonda Primes, MD

## 2015-12-29 NOTE — Assessment & Plan Note (Signed)
Do not climb ladders

## 2016-05-11 ENCOUNTER — Other Ambulatory Visit: Payer: Self-pay | Admitting: Physician Assistant

## 2016-05-13 NOTE — Telephone Encounter (Signed)
Please advise 

## 2016-05-13 NOTE — Telephone Encounter (Signed)
Rx(s) sent to pharmacy electronically.  

## 2016-05-20 ENCOUNTER — Other Ambulatory Visit: Payer: Self-pay | Admitting: Physician Assistant

## 2016-05-20 NOTE — Telephone Encounter (Signed)
Rx request sent to pharmacy.  

## 2016-05-27 IMAGING — CR DG CHEST 1V PORT
1 series · 1 of 1 positions shown · non-contrast
Comparison: 06/04/2013

CLINICAL DATA: Code STEMI

EXAM:
PORTABLE CHEST - 1 VIEW

[AP]
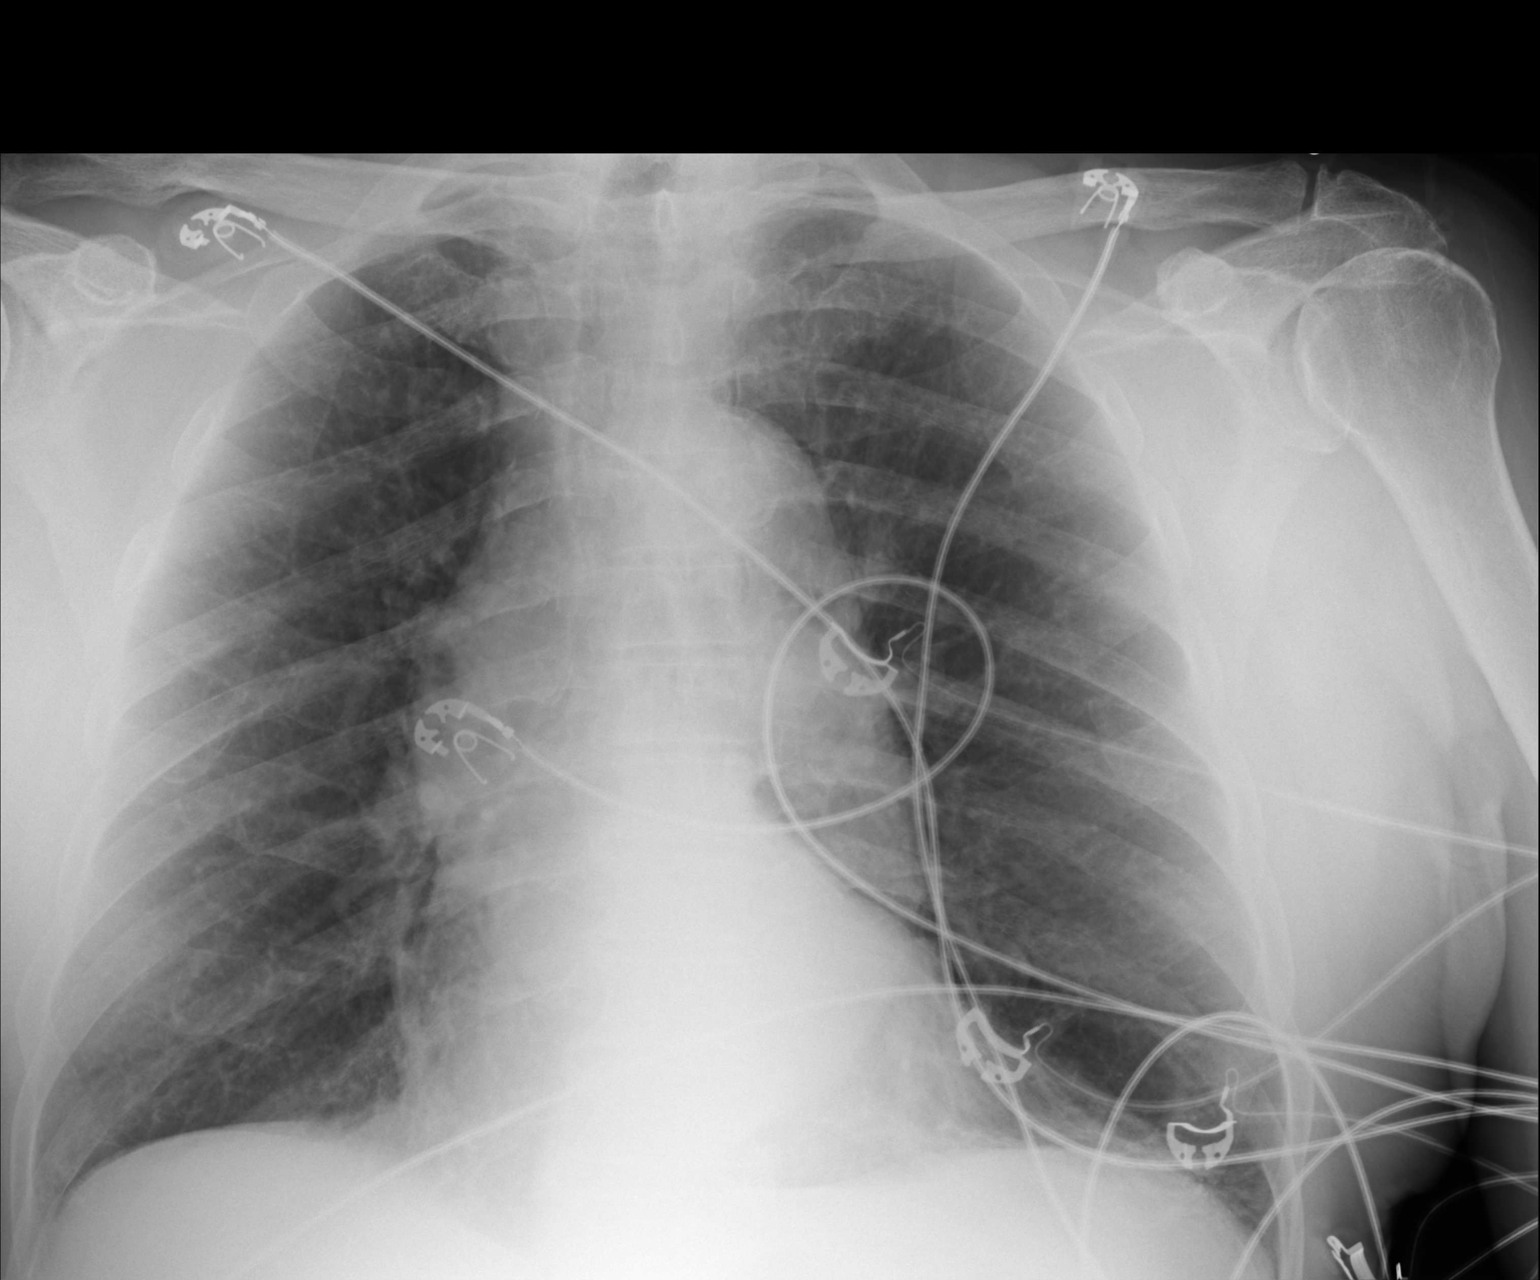

[1 of 1 positions shown; findings below may reference images not displayed]

FINDINGS: The heart size and mediastinal contours are within normal limits.
Both lungs are clear. The visualized skeletal structures are
unremarkable.
IMPRESSION: No active disease.

## 2016-06-02 ENCOUNTER — Other Ambulatory Visit: Payer: Self-pay | Admitting: Physician Assistant

## 2016-06-03 NOTE — Telephone Encounter (Signed)
Rx request sent to pharmacy.  

## 2016-06-26 ENCOUNTER — Other Ambulatory Visit: Payer: Self-pay | Admitting: Physician Assistant

## 2016-07-11 DIAGNOSIS — H52223 Regular astigmatism, bilateral: Secondary | ICD-10-CM | POA: Diagnosis not present

## 2016-07-11 DIAGNOSIS — H25813 Combined forms of age-related cataract, bilateral: Secondary | ICD-10-CM | POA: Diagnosis not present

## 2016-08-02 ENCOUNTER — Other Ambulatory Visit: Payer: Self-pay

## 2016-09-18 DIAGNOSIS — H2513 Age-related nuclear cataract, bilateral: Secondary | ICD-10-CM | POA: Diagnosis not present

## 2016-09-18 DIAGNOSIS — I10 Essential (primary) hypertension: Secondary | ICD-10-CM | POA: Diagnosis not present

## 2016-09-18 DIAGNOSIS — H35053 Retinal neovascularization, unspecified, bilateral: Secondary | ICD-10-CM | POA: Diagnosis not present

## 2016-09-19 ENCOUNTER — Encounter (INDEPENDENT_AMBULATORY_CARE_PROVIDER_SITE_OTHER): Payer: Medicare Other | Admitting: Ophthalmology

## 2016-09-19 DIAGNOSIS — H43813 Vitreous degeneration, bilateral: Secondary | ICD-10-CM

## 2016-09-19 DIAGNOSIS — H35033 Hypertensive retinopathy, bilateral: Secondary | ICD-10-CM

## 2016-09-19 DIAGNOSIS — I10 Essential (primary) hypertension: Secondary | ICD-10-CM | POA: Diagnosis not present

## 2016-09-19 DIAGNOSIS — H34831 Tributary (branch) retinal vein occlusion, right eye, with macular edema: Secondary | ICD-10-CM | POA: Diagnosis not present

## 2016-10-08 ENCOUNTER — Ambulatory Visit (INDEPENDENT_AMBULATORY_CARE_PROVIDER_SITE_OTHER): Payer: Medicare Other | Admitting: Internal Medicine

## 2016-10-08 ENCOUNTER — Encounter: Payer: Self-pay | Admitting: Internal Medicine

## 2016-10-08 DIAGNOSIS — I255 Ischemic cardiomyopathy: Secondary | ICD-10-CM | POA: Diagnosis not present

## 2016-10-08 DIAGNOSIS — K219 Gastro-esophageal reflux disease without esophagitis: Secondary | ICD-10-CM

## 2016-10-08 DIAGNOSIS — J449 Chronic obstructive pulmonary disease, unspecified: Secondary | ICD-10-CM | POA: Diagnosis not present

## 2016-10-08 DIAGNOSIS — I2510 Atherosclerotic heart disease of native coronary artery without angina pectoris: Secondary | ICD-10-CM

## 2016-10-08 DIAGNOSIS — I10 Essential (primary) hypertension: Secondary | ICD-10-CM

## 2016-10-08 MED ORDER — ALBUTEROL SULFATE HFA 108 (90 BASE) MCG/ACT IN AERS: 2.0000 | INHALATION_SPRAY | Freq: Four times a day (QID) | RESPIRATORY_TRACT | 5 refills | Status: DC | PRN

## 2016-10-08 MED ORDER — UMECLIDINIUM-VILANTEROL 62.5-25 MCG/INH IN AEPB: 1.0000 | INHALATION_SPRAY | Freq: Every day | RESPIRATORY_TRACT | 11 refills | Status: DC

## 2016-10-08 MED ORDER — PANTOPRAZOLE SODIUM 40 MG PO TBEC: 40.0000 mg | DELAYED_RELEASE_TABLET | Freq: Every day | ORAL | 11 refills | Status: DC

## 2016-10-08 MED ORDER — CARVEDILOL 25 MG PO TABS: 25.0000 mg | ORAL_TABLET | Freq: Two times a day (BID) | ORAL | 3 refills | Status: DC

## 2016-10-08 MED ORDER — LOSARTAN POTASSIUM 100 MG PO TABS: 100.0000 mg | ORAL_TABLET | Freq: Every day | ORAL | 11 refills | Status: DC

## 2016-10-08 NOTE — Progress Notes (Signed)
Pre visit review using our clinic review tool, if applicable. No additional management support is needed unless otherwise documented below in the visit note. 

## 2016-10-08 NOTE — Assessment & Plan Note (Signed)
Losartan - increase the dose, Lipitor, Plavix, Toprol - d/c, ASA Start Coreg

## 2016-10-08 NOTE — Progress Notes (Signed)
Subjective:  Patient ID: Jon Baker, male    DOB: [birth year]  Age: 82 y.o. MRN: [id number]  CC: No chief complaint on file.   HPI Kanyon Grun presents for HTN, CAD, dyslipidemia, COPD f/u. C/o elevated BP, mucus at night  Outpatient Medications Prior to Visit  Medication Sig Dispense Refill  . albuterol (PROVENTIL HFA;VENTOLIN HFA) 108 (90 BASE) MCG/ACT inhaler Inhale 2 puffs into the lungs every 6 (six) hours as needed for wheezing or shortness of breath.    [name] aspirin EC 81 MG tablet Take 1 tablet (81 mg total) by mouth daily. 100 tablet 3  . atorvastatin (LIPITOR) 80 MG tablet TAKE 1 TABLET (80 MG TOTAL) BY MOUTH DAILY AT 6 PM. 30 tablet 11  . cholecalciferol (VITAMIN D) 1000 UNITS tablet Take 1,000 Units by mouth daily.    . clopidogrel (PLAVIX) 75 MG tablet TAKE 1 TABLET (75 MG TOTAL) BY MOUTH DAILY. 06/09/2015 TAKE 300 MG BY MOUTH FOR FIRST DOSE ONLY. 90 tablet 1  . loratadine (CLARITIN) 10 MG tablet Take 1 tablet (10 mg total) by mouth daily. 100 tablet 3  . losartan (COZAAR) 25 MG tablet TAKE 1 TABLET (25 MG TOTAL) BY MOUTH DAILY. 30 tablet 11  . metoprolol tartrate (LOPRESSOR) 25 MG tablet TAKE 0.5 TABLETS (12.5 MG TOTAL) BY MOUTH 2 (TWO) TIMES DAILY. 90 tablet 1  . nitroGLYCERIN (NITROSTAT) 0.4 MG SL tablet Place 1 tablet (0.4 mg total) under the tongue every 5 (five) minutes as needed for chest pain. 25 tablet 3   No facility-administered medications prior to visit.     ROS Review of Systems  Constitutional: Positive for fatigue. Negative for appetite change and unexpected weight change.  HENT: Positive for congestion. Negative for nosebleeds, sneezing, sore throat and trouble swallowing.   Eyes: Negative for itching and visual disturbance.  Respiratory: Positive for shortness of breath. Negative for cough.   Cardiovascular: Negative for chest pain, palpitations and leg swelling.  Gastrointestinal: Negative for abdominal distention, blood in stool, diarrhea and  nausea.  Genitourinary: Negative for frequency and hematuria.  Musculoskeletal: Negative for back pain, gait problem, joint swelling and neck pain.  Skin: Negative for rash.  Neurological: Negative for dizziness, tremors, speech difficulty and weakness.  Psychiatric/Behavioral: Negative for agitation, dysphoric mood and sleep disturbance. The patient is not nervous/anxious.     Objective:  BP (!) 160/94   Pulse (!) 54   Wt 213 lb (96.6 kg)   SpO2 92%   BMI 30.56 kg/m   BP Readings from Last 3 Encounters:  10/08/16 (!) 160/94  12/29/15 118/64  10/25/15 132/90    Wt Readings from Last 3 Encounters:  10/08/16 213 lb (96.6 kg)  12/29/15 215 lb (97.5 kg)  10/25/15 213 lb (96.6 kg)    Physical Exam  Constitutional: He is oriented to person, place, and time. He appears well-developed. No distress.  NAD  HENT:  Mouth/Throat: Oropharynx is clear and moist.  Eyes: Conjunctivae are normal. Pupils are equal, round, and reactive to light.  Neck: Normal range of motion. No JVD present. No thyromegaly present.  Cardiovascular: Normal rate, regular rhythm, normal heart sounds and intact distal pulses.  Exam reveals no gallop and no friction rub.   No murmur heard. Pulmonary/Chest: Effort normal and breath sounds normal. No respiratory distress. He has no wheezes. He has no rales. He exhibits no tenderness.  Abdominal: Soft. Bowel sounds are normal. He exhibits no distension and no mass. There is no tenderness. There is no rebound  and no guarding.  Musculoskeletal: Normal range of motion. He exhibits no edema or tenderness.  Lymphadenopathy:    He has no cervical adenopathy.  Neurological: He is alert and oriented to person, place, and time. He has normal reflexes. No cranial nerve deficit. He exhibits normal muscle tone. He displays a negative Romberg sign. Coordination and gait normal.  Skin: Skin is warm and dry. No rash noted.  Psychiatric: He has a normal mood and affect. His behavior  is normal. Judgment and thought content normal.     I personally provided Anoro inhaler use teaching. After the teaching patient was able to demonstrate it's use effectively. All questions were answered  Lab Results  Component Value Date   WBC 9.6 05/23/2015   HGB 15.7 05/23/2015   HCT 44.2 05/23/2015   PLT 259 05/23/2015   GLUCOSE 94 07/20/2015   CHOL 125 07/20/2015   TRIG 136.0 07/20/2015   HDL 25.40 (L) 07/20/2015   LDLCALC 72 07/20/2015   ALT 32 08/01/2015   AST 27 08/01/2015   NA 139 07/20/2015   K 4.5 07/20/2015   CL 105 07/20/2015   CREATININE 1.03 07/20/2015   BUN 20 07/20/2015   CO2 20 07/20/2015   TSH 2.04 03/31/2015   PSA 4.51 (H) 03/31/2015   INR 1.10 05/20/2015   HGBA1C 5.6 05/22/2015    No results found.  Assessment & Plan:   There are no diagnoses linked to this encounter. I am having Mr. Harden MoGilbreath maintain his aspirin EC, nitroGLYCERIN, cholecalciferol, albuterol, loratadine, atorvastatin, losartan, clopidogrel, metoprolol tartrate, and BESIVANCE.  Meds ordered this encounter  Medications  . BESIVANCE 0.6 % SUSP    Sig: as directed.     Follow-up: No Follow-up on file.  Sonda PrimesAlex [name], MD

## 2016-10-08 NOTE — Assessment & Plan Note (Signed)
Losartan - increase the dose,  Toprol - d/c,  Start Coreg

## 2016-10-08 NOTE — Patient Instructions (Signed)
Get a foam wedge for bed

## 2016-10-08 NOTE — Assessment & Plan Note (Signed)
Protonix Get a foam wedge for bed

## 2016-10-08 NOTE — Assessment & Plan Note (Signed)
Start Anoro 

## 2016-10-11 ENCOUNTER — Encounter (INDEPENDENT_AMBULATORY_CARE_PROVIDER_SITE_OTHER): Payer: Medicare Other | Admitting: Ophthalmology

## 2016-10-11 DIAGNOSIS — I10 Essential (primary) hypertension: Secondary | ICD-10-CM

## 2016-10-11 DIAGNOSIS — H35033 Hypertensive retinopathy, bilateral: Secondary | ICD-10-CM

## 2016-10-11 DIAGNOSIS — H34831 Tributary (branch) retinal vein occlusion, right eye, with macular edema: Secondary | ICD-10-CM

## 2016-10-11 DIAGNOSIS — H353121 Nonexudative age-related macular degeneration, left eye, early dry stage: Secondary | ICD-10-CM

## 2016-10-11 DIAGNOSIS — H43813 Vitreous degeneration, bilateral: Secondary | ICD-10-CM | POA: Diagnosis not present

## 2016-11-05 ENCOUNTER — Ambulatory Visit (INDEPENDENT_AMBULATORY_CARE_PROVIDER_SITE_OTHER): Payer: Medicare Other | Admitting: Internal Medicine

## 2016-11-05 ENCOUNTER — Encounter: Payer: Self-pay | Admitting: Internal Medicine

## 2016-11-05 DIAGNOSIS — I10 Essential (primary) hypertension: Secondary | ICD-10-CM

## 2016-11-05 DIAGNOSIS — I255 Ischemic cardiomyopathy: Secondary | ICD-10-CM | POA: Diagnosis not present

## 2016-11-05 DIAGNOSIS — I2510 Atherosclerotic heart disease of native coronary artery without angina pectoris: Secondary | ICD-10-CM | POA: Diagnosis not present

## 2016-11-05 DIAGNOSIS — L570 Actinic keratosis: Secondary | ICD-10-CM | POA: Diagnosis not present

## 2016-11-05 DIAGNOSIS — E782 Mixed hyperlipidemia: Secondary | ICD-10-CM

## 2016-11-05 NOTE — Patient Instructions (Signed)
OK to take Carvedilol 1/2 tab twice a day if BP<110/70

## 2016-11-05 NOTE — Assessment & Plan Note (Signed)
On Lipitor 

## 2016-11-05 NOTE — Assessment & Plan Note (Signed)
OK to take Carvedilol 1/2 tab twice a day if BP<110/70  

## 2016-11-05 NOTE — Assessment & Plan Note (Signed)
Coreg, Plavix, ASA, Lipitor

## 2016-11-05 NOTE — Assessment & Plan Note (Signed)
Cryo  

## 2016-11-05 NOTE — Progress Notes (Signed)
Subjective:  Patient ID: Jon Baker, male    DOB: 17-Jun-1932  Age: 82 y.o. MRN: [id number]  CC: Follow-up (4-Wks: HTN; CAD; COPD; GERD)   HPI Jon EarlsKelly Puglia presents for HTN, dyslipidemia, allergies, OA f/u  Outpatient Medications Prior to Visit  Medication Sig Dispense Refill  . albuterol (PROVENTIL HFA;VENTOLIN HFA) 108 (90 Base) MCG/ACT inhaler Inhale 2 puffs into the lungs every 6 (six) hours as needed for wheezing or shortness of breath. 1 Inhaler 5  . aspirin EC 81 MG tablet Take 1 tablet (81 mg total) by mouth daily. 100 tablet 3  . atorvastatin (LIPITOR) 80 MG tablet TAKE 1 TABLET (80 MG TOTAL) BY MOUTH DAILY AT 6 PM. 30 tablet 11  . BESIVANCE 0.6 % SUSP as directed.    . carvedilol (COREG) 25 MG tablet Take 1 tablet (25 mg total) by mouth 2 (two) times daily with a meal. 60 tablet 3  . cholecalciferol (VITAMIN D) 1000 UNITS tablet Take 1,000 Units by mouth daily.    . clopidogrel (PLAVIX) 75 MG tablet TAKE 1 TABLET (75 MG TOTAL) BY MOUTH DAILY. 06/09/2015 TAKE 300 MG BY MOUTH FOR FIRST DOSE ONLY. 90 tablet 1  . loratadine (CLARITIN) 10 MG tablet Take 1 tablet (10 mg total) by mouth daily. 100 tablet 3  . losartan (COZAAR) 100 MG tablet Take 1 tablet (100 mg total) by mouth daily. 30 tablet 11  . nitroGLYCERIN (NITROSTAT) 0.4 MG SL tablet Place 1 tablet (0.4 mg total) under the tongue every 5 (five) minutes as needed for chest pain. 25 tablet 3  . pantoprazole (PROTONIX) 40 MG tablet Take 1 tablet (40 mg total) by mouth daily. 30 tablet 11  . umeclidinium-vilanterol (ANORO ELLIPTA) 62.5-25 MCG/INH AEPB Inhale 1 puff into the lungs daily. 1 each 11   No facility-administered medications prior to visit.     ROS Review of Systems  Constitutional: Negative for appetite change, fatigue and unexpected weight change.  HENT: Negative for congestion, nosebleeds, sneezing, sore throat and trouble swallowing.   Eyes: Negative for itching and visual disturbance.  Respiratory:  Negative for cough.   Cardiovascular: Negative for chest pain, palpitations and leg swelling.  Gastrointestinal: Negative for abdominal distention, blood in stool, diarrhea and nausea.  Genitourinary: Negative for frequency and hematuria.  Musculoskeletal: Positive for arthralgias and back pain. Negative for gait problem, joint swelling and neck pain.  Skin: Negative for rash.  Neurological: Negative for dizziness, tremors, speech difficulty and weakness.  Psychiatric/Behavioral: Negative for agitation, dysphoric mood, sleep disturbance and suicidal ideas. The patient is not nervous/anxious.     Objective:  BP 100/60 (BP Location: Right Arm, Patient Position: Sitting, Cuff Size: Large)   Pulse (!) 45   Temp 97.8 F (36.6 C) (Oral)   Resp 16   Ht 5\' 10"  (1.778 m)   Wt 216 lb (98 kg)   SpO2 92%   BMI 30.99 kg/m   BP Readings from Last 3 Encounters:  11/05/16 100/60  10/08/16 (!) 160/94  12/29/15 118/64    Wt Readings from Last 3 Encounters:  11/05/16 216 lb (98 kg)  10/08/16 213 lb (96.6 kg)  12/29/15 215 lb (97.5 kg)    Physical Exam  Constitutional: He is oriented to person, place, and time. He appears well-developed. No distress.  NAD  HENT:  Mouth/Throat: Oropharynx is clear and moist.  Eyes: Conjunctivae are normal. Pupils are equal, round, and reactive to light.  Neck: Normal range of motion. No JVD present. No thyromegaly present.  Cardiovascular: Normal rate, regular rhythm, normal heart sounds and intact distal pulses.  Exam reveals no gallop and no friction rub.   No murmur heard. Pulmonary/Chest: Effort normal and breath sounds normal. No respiratory distress. He has no wheezes. He has no rales. He exhibits no tenderness.  Abdominal: Soft. Bowel sounds are normal. He exhibits no distension and no mass. There is no tenderness. There is no rebound and no guarding.  Musculoskeletal: Normal range of motion. He exhibits tenderness. He exhibits no edema.    Lymphadenopathy:    He has no cervical adenopathy.  Neurological: He is alert and oriented to person, place, and time. He has normal reflexes. No cranial nerve deficit. He exhibits normal muscle tone. He displays a negative Romberg sign. Coordination and gait normal.  Skin: Skin is warm and dry. No rash noted.  Psychiatric: He has a normal mood and affect. His behavior is normal. Judgment and thought content normal.  LS tender L ear AKs   Procedure Note :     Procedure : Cryosurgery   Indication:  Actinic keratosis(es)   Risks including unsuccessful procedure , bleeding, infection, bruising, scar, a need for a repeat  procedure and others were explained to the patient in detail as well as the benefits. Informed consent was obtained verbally.    1 lesion(s)  in  L ear  was/were treated with liquid nitrogen on a Q-tip in a usual fasion . Band-Aid was applied and antibiotic ointment was given for a later use.   Tolerated well. Complications none.   Postprocedure instructions :     Keep the wounds clean. You can wash them with liquid soap and water. Pat dry with gauze or a Kleenex tissue  Before applying antibiotic ointment and a Band-Aid.   You need to report immediately  if  any signs of infection develop.     Lab Results  Component Value Date   WBC 9.6 05/23/2015   HGB 15.7 05/23/2015   HCT 44.2 05/23/2015   PLT 259 05/23/2015   GLUCOSE 94 07/20/2015   CHOL 125 07/20/2015   TRIG 136.0 07/20/2015   HDL 25.40 (L) 07/20/2015   LDLCALC 72 07/20/2015   ALT 32 08/01/2015   AST 27 08/01/2015   NA 139 07/20/2015   K 4.5 07/20/2015   CL 105 07/20/2015   CREATININE 1.03 07/20/2015   BUN 20 07/20/2015   CO2 20 07/20/2015   TSH 2.04 03/31/2015   PSA 4.51 (H) 03/31/2015   INR 1.10 05/20/2015   HGBA1C 5.6 05/22/2015    No results found.  Assessment & Plan:   There are no diagnoses linked to this encounter. I am having Mr. Mansel maintain his aspirin EC, nitroGLYCERIN,  cholecalciferol, loratadine, atorvastatin, clopidogrel, BESIVANCE, losartan, carvedilol, umeclidinium-vilanterol, albuterol, and pantoprazole.  No orders of the defined types were placed in this encounter.    Follow-up: No Follow-up on file.  Sonda Primes, MD

## 2016-11-05 NOTE — Progress Notes (Signed)
Pre visit review using our clinic review tool, if applicable. No additional management support is needed unless otherwise documented below in the visit note/SLS  

## 2016-11-08 ENCOUNTER — Encounter (INDEPENDENT_AMBULATORY_CARE_PROVIDER_SITE_OTHER): Payer: Medicare Other | Admitting: Ophthalmology

## 2016-11-21 ENCOUNTER — Encounter (INDEPENDENT_AMBULATORY_CARE_PROVIDER_SITE_OTHER): Payer: Medicare Other | Admitting: Ophthalmology

## 2016-11-21 DIAGNOSIS — H35033 Hypertensive retinopathy, bilateral: Secondary | ICD-10-CM | POA: Diagnosis not present

## 2016-11-21 DIAGNOSIS — H43813 Vitreous degeneration, bilateral: Secondary | ICD-10-CM | POA: Diagnosis not present

## 2016-11-21 DIAGNOSIS — H353121 Nonexudative age-related macular degeneration, left eye, early dry stage: Secondary | ICD-10-CM

## 2016-11-21 DIAGNOSIS — I10 Essential (primary) hypertension: Secondary | ICD-10-CM

## 2016-11-21 DIAGNOSIS — H34831 Tributary (branch) retinal vein occlusion, right eye, with macular edema: Secondary | ICD-10-CM

## 2016-11-26 ENCOUNTER — Other Ambulatory Visit: Payer: Self-pay | Admitting: Cardiovascular Disease

## 2016-12-20 ENCOUNTER — Encounter (INDEPENDENT_AMBULATORY_CARE_PROVIDER_SITE_OTHER): Payer: Medicare Other | Admitting: Ophthalmology

## 2016-12-20 DIAGNOSIS — H34831 Tributary (branch) retinal vein occlusion, right eye, with macular edema: Secondary | ICD-10-CM

## 2016-12-20 DIAGNOSIS — H43813 Vitreous degeneration, bilateral: Secondary | ICD-10-CM | POA: Diagnosis not present

## 2016-12-20 DIAGNOSIS — H35033 Hypertensive retinopathy, bilateral: Secondary | ICD-10-CM

## 2016-12-20 DIAGNOSIS — H353121 Nonexudative age-related macular degeneration, left eye, early dry stage: Secondary | ICD-10-CM

## 2016-12-20 DIAGNOSIS — I10 Essential (primary) hypertension: Secondary | ICD-10-CM

## 2017-01-17 ENCOUNTER — Encounter (INDEPENDENT_AMBULATORY_CARE_PROVIDER_SITE_OTHER): Payer: Medicare Other | Admitting: Ophthalmology

## 2017-01-17 DIAGNOSIS — I10 Essential (primary) hypertension: Secondary | ICD-10-CM

## 2017-01-17 DIAGNOSIS — H34831 Tributary (branch) retinal vein occlusion, right eye, with macular edema: Secondary | ICD-10-CM

## 2017-01-17 DIAGNOSIS — H2513 Age-related nuclear cataract, bilateral: Secondary | ICD-10-CM

## 2017-01-17 DIAGNOSIS — H35033 Hypertensive retinopathy, bilateral: Secondary | ICD-10-CM

## 2017-01-17 DIAGNOSIS — H43813 Vitreous degeneration, bilateral: Secondary | ICD-10-CM

## 2017-01-20 ENCOUNTER — Ambulatory Visit (INDEPENDENT_AMBULATORY_CARE_PROVIDER_SITE_OTHER): Payer: Medicare Other | Admitting: Medical

## 2017-01-20 ENCOUNTER — Telehealth: Payer: Self-pay | Admitting: Internal Medicine

## 2017-01-20 ENCOUNTER — Telehealth: Payer: Self-pay | Admitting: Medical

## 2017-01-20 ENCOUNTER — Ambulatory Visit (HOSPITAL_BASED_OUTPATIENT_CLINIC_OR_DEPARTMENT_OTHER)
Admission: RE | Admit: 2017-01-20 | Discharge: 2017-01-20 | Disposition: A | Discharge status: Home / Self Care | Payer: Medicare Other | Source: Ambulatory Visit | Attending: Medical | Admitting: Medical

## 2017-01-20 ENCOUNTER — Encounter: Payer: Self-pay | Admitting: Medical

## 2017-01-20 VITALS — BP 161/84 | HR 50 | Temp 98.2°F | Resp 18 | Ht 70.0 in | Wt 215.4 lb

## 2017-01-20 DIAGNOSIS — J449 Chronic obstructive pulmonary disease, unspecified: Secondary | ICD-10-CM | POA: Insufficient documentation

## 2017-01-20 DIAGNOSIS — R0602 Shortness of breath: Secondary | ICD-10-CM | POA: Diagnosis not present

## 2017-01-20 DIAGNOSIS — I10 Essential (primary) hypertension: Secondary | ICD-10-CM | POA: Diagnosis not present

## 2017-01-20 DIAGNOSIS — R062 Wheezing: Secondary | ICD-10-CM | POA: Diagnosis not present

## 2017-01-20 DIAGNOSIS — J40 Bronchitis, not specified as acute or chronic: Secondary | ICD-10-CM

## 2017-01-20 DIAGNOSIS — I700 Atherosclerosis of aorta: Secondary | ICD-10-CM | POA: Insufficient documentation

## 2017-01-20 MED ORDER — AZITHROMYCIN 250 MG PO TABS: ORAL_TABLET | ORAL | 0 refills | Status: DC

## 2017-01-20 MED ORDER — IPRATROPIUM-ALBUTEROL 0.5-2.5 (3) MG/3ML IN SOLN: 3.0000 mL | Freq: Four times a day (QID) | RESPIRATORY_TRACT | Status: DC

## 2017-01-20 MED ORDER — HYDROCHLOROTHIAZIDE 12.5 MG PO CAPS: 12.5000 mg | ORAL_CAPSULE | Freq: Every day | ORAL | 0 refills | Status: DC

## 2017-01-20 MED ORDER — PREDNISONE 10 MG PO TABS: ORAL_TABLET | ORAL | 0 refills | Status: DC

## 2017-01-20 MED ORDER — METHYLPREDNISOLONE ACETATE 40 MG/ML IJ SUSP
40.0000 mg | Freq: Once | INTRAMUSCULAR | Status: AC
  Administered 2017-01-20: 40 mg via INTRAMUSCULAR

## 2017-01-20 MED ORDER — NITROGLYCERIN 0.4 MG SL SUBL: 0.4000 mg | SUBLINGUAL_TABLET | SUBLINGUAL | 3 refills | Status: AC | PRN

## 2017-01-20 MED ORDER — IPRATROPIUM BROMIDE HFA 17 MCG/ACT IN AERS: 2.0000 | INHALATION_SPRAY | Freq: Four times a day (QID) | RESPIRATORY_TRACT | 3 refills | Status: DC | PRN

## 2017-01-20 NOTE — Progress Notes (Signed)
Subjective:    Patient ID: Jon Baker, male    DOB: [birth year], 83 y.o.   MRN: [id number]  HPI   Pt in with history of sob and wheezing last months. He is coughing with sob. Pt is bringing up mucous when he coughs. Pt has history of smoking but stopped in 1989. Smoked for 50 years. He smoked about 2 packs a day.  Pt states his MD had him on proventil but in past he did not use much. Recently using and it has not helped much.   Pt is not diabetic.  Pt is very wheezy.   Pt bp mild elevated has been elevated in the past. No cardiac or stroke signs or symptoms.   Review of Systems  Constitutional: Positive for fatigue. Negative for chills and fever.  HENT: Negative for congestion, facial swelling, sinus pain and sinus pressure.   Respiratory: Positive for cough and wheezing. Negative for choking and chest tightness.   Cardiovascular: Negative for chest pain and palpitations.  Gastrointestinal: Negative for abdominal pain, nausea and vomiting.  Musculoskeletal: Negative for back pain, gait problem, myalgias and neck stiffness.  Skin: Negative for rash.  Neurological: Negative for dizziness, facial asymmetry, weakness and headaches.  Hematological: Negative for adenopathy. Does not bruise/bleed easily.  Psychiatric/Behavioral: Negative for behavioral problems, confusion and suicidal ideas.    Past Medical History:  Diagnosis Date  . Aortic stenosis    a. Mild by echo 08/2015.   [name] CAD (coronary artery disease)    a. STEMI 05/2015 s/p DES to LAD.  [name] Claudication [facility])    a. 2009, saw  cardiology, declined ABIs.  [name] COPD (chronic obstructive pulmonary disease) (HCC)    states he has been told by MD he has copd  . Depression   . DJD (degenerative joint disease)   . Dyslipidemia 2016  . HTN (hypertension)   . Ischemic cardiomyopathy    a. 05/2015: EF 35% at cath, 35-40% by echo. b. EF improved to 55-60% by echo 08/2015.  [name] Pancreatitis, gallstone 05/2013  . RBBB   . STEMI (ST  elevation myocardial infarction) (HCC) 05/20/15   3.0 x 28 mm Promus premier DES to the LAD     Social History   Social History  . Marital status: Married    Spouse name: N/A  . Number of children: N/A  . Years of education: N/A   Occupational History  . Not on file.   Social History Main Topics  . Smoking status: Former Smoker    Quit date: 12/10/1987  . Smokeless tobacco: Never Used  . Alcohol use No  . Drug use: No  . Sexual activity: Not on file   Other Topics Concern  . Not on file   Social History Narrative  . No narrative on file    Past Surgical History:  Procedure Laterality Date  . CARDIAC CATHETERIZATION N/A 05/20/2015   Procedure: Left Heart Cath and Coronary Angiography;  Surgeon: [name], MD; LAD 99%, circumflex 50%, RCA 40%, EF 35% with akinesis in the mid anterior wall apex and inferior apex   . CARDIAC CATHETERIZATION N/A 05/20/2015   Procedure: Coronary Stent Intervention;  Surgeon: [name], MD;  3.0 x 28 mm Promus premier DES to the LAD  . CHOLECYSTECTOMY N/A 06/06/2013   Procedure: LAPAROSCOPIC CHOLECYSTECTOMY WITH INTRAOPERATIVE CHOLANGIOGRAM;  Surgeon: Liz Malady, MD;  Location: [facility] OR;  Service: General;  Laterality: N/A;  . Glanglian cyst removal Left 1970's  . myleogram  1979  .  TOTAL KNEE ARTHROPLASTY Right 02-2011   Dr Despina Hick    Family History  Problem Relation Age of Onset  . Coronary artery disease Mother     pancreatic cancer  . Coronary artery disease Father     several MI'S  . Diabetes Neg Hx   . Cancer Sister     breast cancer at age 53    No Known Allergies  Current Outpatient Prescriptions on File Prior to Visit  Medication Sig Dispense Refill  . albuterol (PROVENTIL HFA;VENTOLIN HFA) 108 (90 Base) MCG/ACT inhaler Inhale 2 puffs into the lungs every 6 (six) hours as needed for wheezing or shortness of breath. 1 Inhaler 5  . aspirin EC 81 MG tablet Take 1 tablet (81 mg total) by mouth daily. 100 tablet 3  .  atorvastatin (LIPITOR) 80 MG tablet TAKE 1 TABLET (80 MG TOTAL) BY MOUTH DAILY AT 6 PM. 30 tablet 11  . carvedilol (COREG) 25 MG tablet Take 1 tablet (25 mg total) by mouth 2 (two) times daily with a meal. 60 tablet 3  . cholecalciferol (VITAMIN D) 1000 UNITS tablet Take 1,000 Units by mouth daily.    . clopidogrel (PLAVIX) 75 MG tablet TAKE 1 TABLET BY MOUTH DAILY. 06/09/2015 TAKE 4 TABLETS BY MOUTH FOR FIRST DOSE ONLY. 90 tablet 1  . losartan (COZAAR) 100 MG tablet Take 1 tablet (100 mg total) by mouth daily. 30 tablet 11  . nitroGLYCERIN (NITROSTAT) 0.4 MG SL tablet Place 1 tablet (0.4 mg total) under the tongue every 5 (five) minutes as needed for chest pain. 25 tablet 3  . pantoprazole (PROTONIX) 40 MG tablet Take 1 tablet (40 mg total) by mouth daily. 30 tablet 11   No current facility-administered medications on file prior to visit.     BP (!) 161/84 (BP Location: Right Arm, Patient Position: Sitting, Cuff Size: Large)   Pulse (!) 50   Temp 98.2 F (36.8 C) (Oral)   Resp 18   Ht 5\' 10"  (1.778 m)   Wt 215 lb 6 oz (97.7 kg)   SpO2 93%   BMI 30.90 kg/m       Objective:   Physical Exam  General  Mental Status - Alert. General Appearance - Well groomed. Not in acute distress.  Skin Rashes- No Rashes.  HEENT Head- Normal. Ear Auditory Canal - Left- Normal. Right - Normal.Tympanic Membrane- Left- Normal. Right- Normal. Eye Sclera/Conjunctiva- Left- Normal. Right- Normal. Nose & Sinuses Nasal Mucosa- Left-  Boggy and Congested. Right-  Boggy and  Congested.Bilateral maxillary and frontal sinus pressure. Mouth & Throat Lips: Upper Lip- Normal: no dryness, cracking, pallor, cyanosis, or vesicular eruption. Lower Lip-Normal: no dryness, cracking, pallor, cyanosis or vesicular eruption. Buccal Mucosa- Bilateral- No Aphthous ulcers. Oropharynx- No Discharge or Erythema. Tonsils: Characteristics- Bilateral- No Erythema or Congestion. Size/Enlargement- Bilateral- No enlargement.  Discharge- bilateral-None.  Neck Neck- Supple. No Masses.   Chest and Lung Exam Auscultation: Breath Sounds:- even and unlabored at rest but very tight lung field with a lot wheezing.(deeper and clear post neb but still wheezing heard)  Cardiovascular Auscultation:Rythm- Regular, rate and rhythm. Murmurs & Other Heart Sounds:Ausculatation of the heart reveal- No Murmurs.  Lymphatic Head & Neck General Head & Neck Lymphatics: Bilateral: Description- No Localized lymphadenopathy.  Lower ext- faint 1+ pedal edema bilateral.        Assessment & Plan:  For your wheezing today we did a duoneb treatment. Will give you 6 day tape dose of prednisone. Rx atrovent inhaler and continue  your albuterol. Some concern for bronchitis with your wheezing condition. Will rx azithromycin.  Depomedrol 40 mg im in office today.  I want you to get chest xay today.   Will refer you to pulmonologist.  Pt bp mild elevated today. This was the case in October as well. Will get staff to call pt and advise diuretic sent to his pharmacy if his bp remains high above 140/90.Marland KitchenCheck with bp cuff at home if he has. Other wise check in one week with pcp or myself.   Follow up in 7 days or as needed  Shaun Zuccaro, Ramon Dredge, [employer]

## 2017-01-20 NOTE — Telephone Encounter (Signed)
Patient has an appointment with the [place]

## 2017-01-20 NOTE — Telephone Encounter (Signed)
Patient Name: Jon Baker  DOB: September 06, 1932    Initial Comment Caller states father very short of breath   Nurse Assessment      Guidelines    Guideline Title Affirmed Question Affirmed Notes       Final Disposition User   FINAL ATTEMPT MADE - no message left [place], RN, [employer]

## 2017-01-20 NOTE — Progress Notes (Signed)
Pre visit review using our clinic review tool, if applicable. No additional management support is needed unless otherwise documented below in the visit note/SLS  

## 2017-01-20 NOTE — Telephone Encounter (Signed)
Regarding pt blood pressure. High yesterday. Does he have bp cuff at home. I sent in hctz to add on if his bp remains above 140/90. Does he have bp cuff at home to recheck. If not then recheck 1 wk with pcp or with myself.

## 2017-01-20 NOTE — Telephone Encounter (Signed)
Will you get pt in with pulmonologist asap. Like this week if possible.

## 2017-01-20 NOTE — Patient Instructions (Addendum)
For your wheezing today we did a duoneb treatment. Will give you 6 day tape dose of prednisone. Rx atrovent inhaler and continue your albuterol. Some concern for bronchitis with your wheezing condition. Will rx azithromycin.  Depomedrol 40 mg im in office today.  I want you to get chest xay today.   Will refer you to pulmonologist.   Follow up in 7 days or as needed

## 2017-01-20 NOTE — Telephone Encounter (Signed)
pls go to ER Thx

## 2017-01-21 NOTE — Telephone Encounter (Signed)
Referral placed in [facility] work que, awaiting appt

## 2017-01-22 NOTE — Telephone Encounter (Signed)
Copy & Paste from Result Note: Notes Recorded by Tylene Fantasia, RN on 01/21/2017 at 3:22 PM EST Called patient and made him aware. He stated understanding and is in agreement with plan. ------  Notes Recorded by Esperanza Richters, PA-C on 01/20/2017 at 9:52 PM EST No acute infection seen. Continue meds I gave him and follow up with pulmonlogist when scheduled.

## 2017-01-30 ENCOUNTER — Other Ambulatory Visit: Payer: Self-pay | Admitting: Internal Medicine

## 2017-02-03 ENCOUNTER — Ambulatory Visit: Payer: Medicare Other | Admitting: Internal Medicine

## 2017-02-04 ENCOUNTER — Ambulatory Visit (INDEPENDENT_AMBULATORY_CARE_PROVIDER_SITE_OTHER): Payer: Medicare Other | Admitting: Internal Medicine

## 2017-02-04 ENCOUNTER — Encounter: Payer: Self-pay | Admitting: Internal Medicine

## 2017-02-04 DIAGNOSIS — I10 Essential (primary) hypertension: Secondary | ICD-10-CM

## 2017-02-04 DIAGNOSIS — J449 Chronic obstructive pulmonary disease, unspecified: Secondary | ICD-10-CM

## 2017-02-04 DIAGNOSIS — F329 Major depressive disorder, single episode, unspecified: Secondary | ICD-10-CM

## 2017-02-04 DIAGNOSIS — F32A Depression, unspecified: Secondary | ICD-10-CM

## 2017-02-04 MED ORDER — IPRATROPIUM-ALBUTEROL 0.5-2.5 (3) MG/3ML IN SOLN: 3.0000 mL | Freq: Four times a day (QID) | RESPIRATORY_TRACT | 3 refills | Status: DC | PRN

## 2017-02-04 NOTE — Progress Notes (Signed)
Pre-visit discussion using our clinic review tool. No additional management support is needed unless otherwise documented below in the visit note.  

## 2017-02-04 NOTE — Assessment & Plan Note (Signed)
Declined Rx 

## 2017-02-04 NOTE — Progress Notes (Signed)
Subjective:  Patient ID: Jon Baker, male    DOB: 05-Mar-1932  Age: 83 y.o. MRN: [id number]  CC: Follow-up (HTN, would like to discuss change in medication)   HPI Chuong Casebeer presents for URI/COPD f/u  Outpatient Medications Prior to Visit  Medication Sig Dispense Refill  . albuterol (PROVENTIL HFA;VENTOLIN HFA) 108 (90 Base) MCG/ACT inhaler Inhale 2 puffs into the lungs every 6 (six) hours as needed for wheezing or shortness of breath. 1 Inhaler 5  . aspirin EC 81 MG tablet Take 1 tablet (81 mg total) by mouth daily. 100 tablet 3  . atorvastatin (LIPITOR) 80 MG tablet TAKE 1 TABLET (80 MG TOTAL) BY MOUTH DAILY AT 6 PM. 30 tablet 11  . azithromycin (ZITHROMAX) 250 MG tablet Take 2 tablets by mouth on day 1, followed by 1 tablet by mouth daily for 4 days. 6 tablet 0  . carvedilol (COREG) 25 MG tablet TAKE 1 TABLET (25 MG TOTAL) BY MOUTH 2 (TWO) TIMES DAILY WITH A MEAL. 60 tablet 11  . cholecalciferol (VITAMIN D) 1000 UNITS tablet Take 1,000 Units by mouth daily.    . clopidogrel (PLAVIX) 75 MG tablet TAKE 1 TABLET BY MOUTH DAILY. 06/09/2015 TAKE 4 TABLETS BY MOUTH FOR FIRST DOSE ONLY. 90 tablet 1  . hydrochlorothiazide (MICROZIDE) 12.5 MG capsule Take 1 capsule (12.5 mg total) by mouth daily. 30 capsule 0  . ipratropium (ATROVENT HFA) 17 MCG/ACT inhaler Inhale 2 puffs into the lungs every 6 (six) hours as needed for wheezing. 1 Inhaler 3  . losartan (COZAAR) 100 MG tablet Take 1 tablet (100 mg total) by mouth daily. 30 tablet 11  . nitroGLYCERIN (NITROSTAT) 0.4 MG SL tablet Place 1 tablet (0.4 mg total) under the tongue every 5 (five) minutes as needed for chest pain. 25 tablet 3  . pantoprazole (PROTONIX) 40 MG tablet Take 1 tablet (40 mg total) by mouth daily. 30 tablet 11  . predniSONE (DELTASONE) 10 MG tablet 6 TAB PO DAY 1 5 TAB PO DAY 2 4 TAB PO DAY 3 3 TAB PO DAY 4 2 TAB PO DAY 5 1 TAB PO DAY 6 21 tablet 0   Facility-Administered Medications Prior to Visit  Medication  Dose Route Frequency Provider Last Rate Last Dose  . ipratropium-albuterol (DUONEB) 0.5-2.5 (3) MG/3ML nebulizer solution 3 mL  3 mL Nebulization Q6H Edward Saguier, PA-C        ROS Review of Systems  Constitutional: Positive for fatigue. Negative for appetite change and unexpected weight change.  HENT: Negative for congestion, nosebleeds, sneezing, sore throat and trouble swallowing.   Eyes: Negative for itching and visual disturbance.  Respiratory: Positive for shortness of breath and wheezing. Negative for cough.   Cardiovascular: Negative for chest pain, palpitations and leg swelling.  Gastrointestinal: Negative for abdominal distention, blood in stool, diarrhea and nausea.  Genitourinary: Negative for frequency and hematuria.  Musculoskeletal: Positive for arthralgias. Negative for back pain, gait problem, joint swelling and neck pain.  Skin: Negative for rash.  Neurological: Negative for dizziness, tremors, speech difficulty and weakness.  Psychiatric/Behavioral: Positive for sleep disturbance. Negative for agitation, dysphoric mood and suicidal ideas. The patient is nervous/anxious.     Objective:  BP 140/64   Pulse (!) 44   Temp 97.7 F (36.5 C) (Oral)   Resp 16   Ht 5\' 10"  (1.778 m)   Wt 213 lb (96.6 kg)   SpO2 94%   BMI 30.56 kg/m   BP Readings from Last 3 Encounters:  02/04/17  140/64  01/20/17 (!) 161/84  11/05/16 100/60    Wt Readings from Last 3 Encounters:  02/04/17 213 lb (96.6 kg)  01/20/17 215 lb 6 oz (97.7 kg)  11/05/16 216 lb (98 kg)    Physical Exam  Constitutional: He is oriented to person, place, and time. He appears well-developed. No distress.  NAD  HENT:  Mouth/Throat: Oropharynx is clear and moist.  Eyes: Conjunctivae are normal. Pupils are equal, round, and reactive to light.  Neck: Normal range of motion. No JVD present. No thyromegaly present.  Cardiovascular: Regular rhythm and intact distal pulses.  Exam reveals no gallop and no  friction rub.   Murmur heard. bradycardic  Pulmonary/Chest: Effort normal. No respiratory distress. He has wheezes. He has no rales. He exhibits no tenderness.  Abdominal: Soft. Bowel sounds are normal. He exhibits no distension and no mass. There is no tenderness. There is no rebound and no guarding.  Musculoskeletal: Normal range of motion. He exhibits no edema or tenderness.  Lymphadenopathy:    He has no cervical adenopathy.  Neurological: He is alert and oriented to person, place, and time. He has normal reflexes. No cranial nerve deficit. He exhibits normal muscle tone. He displays a negative Romberg sign. Coordination and gait normal.  Skin: Skin is warm and dry. No rash noted.  Psychiatric: He has a normal mood and affect. His behavior is normal. Judgment and thought content normal.    Lab Results  Component Value Date   WBC 9.6 05/23/2015   HGB 15.7 05/23/2015   HCT 44.2 05/23/2015   PLT 259 05/23/2015   GLUCOSE 94 07/20/2015   CHOL 125 07/20/2015   TRIG 136.0 07/20/2015   HDL 25.40 (L) 07/20/2015   LDLCALC 72 07/20/2015   ALT 32 08/01/2015   AST 27 08/01/2015   NA 139 07/20/2015   K 4.5 07/20/2015   CL 105 07/20/2015   CREATININE 1.03 07/20/2015   BUN 20 07/20/2015   CO2 20 07/20/2015   TSH 2.04 03/31/2015   PSA 4.51 (H) 03/31/2015   INR 1.10 05/20/2015   HGBA1C 5.6 05/22/2015    Dg Chest 2 View  Result Date: 01/20/2017 CLINICAL DATA:  Chronic shortness of breath which has worsened over the past 30 days. EXAM: CHEST  2 VIEW COMPARISON:  Single-view of the chest 05/20/2015 and 06/04/2013. FINDINGS: Lungs are clear. Heart size is normal. No pneumothorax or pleural effusion. Tortuous aorta noted. Aortic atherosclerosis is seen. IMPRESSION: No acute disease. Atherosclerosis. Electronically Signed   By: Drusilla Kanner M.D.   On: 01/20/2017 21:18    Assessment & Plan:   There are no diagnoses linked to this encounter. I am having Mr. Waites maintain his aspirin  EC, cholecalciferol, atorvastatin, losartan, albuterol, pantoprazole, clopidogrel, nitroGLYCERIN, predniSONE, azithromycin, ipratropium, hydrochlorothiazide, and carvedilol. We will continue to administer ipratropium-albuterol.  No orders of the defined types were placed in this encounter.    Follow-up: No Follow-up on file.  Sonda Primes, MD

## 2017-02-04 NOTE — Assessment & Plan Note (Signed)
Coreg Losartan 

## 2017-02-04 NOTE — Assessment & Plan Note (Addendum)
Start Duoneb, [place] due to cost Ref to Dr Jamison Neighbor

## 2017-02-06 ENCOUNTER — Telehealth: Payer: Self-pay | Admitting: *Deleted

## 2017-02-06 NOTE — Telephone Encounter (Signed)
Left msg on triage stating we received an order from MD, but order was put in incorrect. We have faxed over an CMN form to be completed and faxed back ASAP...Raechel Chute

## 2017-02-07 NOTE — Telephone Encounter (Signed)
MD signed CMN on yesterday faxed back to [place] yesterday...Raechel Chute

## 2017-02-12 ENCOUNTER — Ambulatory Visit (INDEPENDENT_AMBULATORY_CARE_PROVIDER_SITE_OTHER): Payer: Medicare Other | Admitting: Pulmonary Disease

## 2017-02-12 ENCOUNTER — Other Ambulatory Visit: Payer: Medicare Other

## 2017-02-12 ENCOUNTER — Encounter: Payer: Self-pay | Admitting: Pulmonary Disease

## 2017-02-12 VITALS — BP 128/82 | HR 61 | Ht 70.0 in | Wt 213.0 lb

## 2017-02-12 DIAGNOSIS — Z77090 Contact with and (suspected) exposure to asbestos: Secondary | ICD-10-CM

## 2017-02-12 DIAGNOSIS — H2511 Age-related nuclear cataract, right eye: Secondary | ICD-10-CM | POA: Diagnosis not present

## 2017-02-12 DIAGNOSIS — J449 Chronic obstructive pulmonary disease, unspecified: Secondary | ICD-10-CM

## 2017-02-12 DIAGNOSIS — H2513 Age-related nuclear cataract, bilateral: Secondary | ICD-10-CM | POA: Diagnosis not present

## 2017-02-12 DIAGNOSIS — H353120 Nonexudative age-related macular degeneration, left eye, stage unspecified: Secondary | ICD-10-CM | POA: Diagnosis not present

## 2017-02-12 NOTE — Progress Notes (Signed)
Subjective:    Patient ID: Jon Baker, male    DOB: 1932-01-08, 83 y.o.   MRN: [id number]  HPI Patient was diagnosed with COPD a couple of years ago. He has also been told he has emphysema. Patient reports he has been on inhalers previously but cannot recall them. He reports none of them seemed to help. He reports he has used all of his albuterol rescue inhaler and "isn't renewing it". Recently he was prescribed Duonebs and has a home nebulizer but hasn't started using this. He did do an albuterol treatment yesterday but it didn't seem to have any effect. He reports dyspnea is is main complaint. He reports his dyspnea is largely "the same" but it does seem to have worsened over the last month. Reports his dyspnea is on exertion, even with just walking. He reports he is able to climb 14 steps but has to stop at the top of them sometimes. He wheezes intermittently and also has a cough but this was worse about 3 weeks ago. Does have sinus congestion & drainage chronically but no seasonal variation. No reflux or dyspepsia. No morning brash water taste. He reports he has a heart attack over a year ago and had a stent placed without improvement in his dyspnea. He does reports arthritis and joint pain, especially in his knees. Denies any stiffness in his hands. No joint swelling or erythema. Does have edema in his feet intermittently. He reports frequent urination. He also has urinary incontinence with lifting, bending, over and "any activity".   Review of Systems He reports "numbness" in his legs from his knees down. Denies any recent falls. He reports poor vision, especially in his right eye, and has been diagnosed with cataracts as well as retinal disease. No acute vision loss. No rashes or abnormal bruising. A pertinent 14 point review of systems is negative except as per the history of presenting illness.  No Known Allergies  Current Outpatient Prescriptions on File Prior to Visit  Medication Sig  Dispense Refill  . albuterol (PROVENTIL HFA;VENTOLIN HFA) 108 (90 Base) MCG/ACT inhaler Inhale 2 puffs into the lungs every 6 (six) hours as needed for wheezing or shortness of breath. 1 Inhaler 5  . aspirin EC 81 MG tablet Take 1 tablet (81 mg total) by mouth daily. 100 tablet 3  . atorvastatin (LIPITOR) 80 MG tablet TAKE 1 TABLET (80 MG TOTAL) BY MOUTH DAILY AT 6 PM. 30 tablet 11  . carvedilol (COREG) 25 MG tablet TAKE 1 TABLET (25 MG TOTAL) BY MOUTH 2 (TWO) TIMES DAILY WITH A MEAL. 60 tablet 11  . cholecalciferol (VITAMIN D) 1000 UNITS tablet Take 1,000 Units by mouth daily.    . clopidogrel (PLAVIX) 75 MG tablet TAKE 1 TABLET BY MOUTH DAILY. 06/09/2015 TAKE 4 TABLETS BY MOUTH FOR FIRST DOSE ONLY. 90 tablet 1  . ipratropium-albuterol (DUONEB) 0.5-2.5 (3) MG/3ML SOLN Take 3 mLs by nebulization every 6 (six) hours as needed. 360 mL 3  . losartan (COZAAR) 100 MG tablet Take 1 tablet (100 mg total) by mouth daily. 30 tablet 11  . nitroGLYCERIN (NITROSTAT) 0.4 MG SL tablet Place 1 tablet (0.4 mg total) under the tongue every 5 (five) minutes as needed for chest pain. 25 tablet 3  . pantoprazole (PROTONIX) 40 MG tablet Take 1 tablet (40 mg total) by mouth daily. 30 tablet 11  . hydrochlorothiazide (MICROZIDE) 12.5 MG capsule Take 1 capsule (12.5 mg total) by mouth daily. (Patient not taking: Reported on 02/12/2017) 30 capsule  0   Current Facility-Administered Medications on File Prior to Visit  Medication Dose Route Frequency Provider Last Rate Last Dose  . ipratropium-albuterol (DUONEB) 0.5-2.5 (3) MG/3ML nebulizer solution 3 mL  3 mL Nebulization Q6H Esperanza Richters, PA-C        Past Medical History:  Diagnosis Date  . Aortic stenosis    a. Mild by echo 08/2015.   [name] CAD (coronary artery disease)    a. STEMI 05/2015 s/p DES to LAD.  [name] Claudication [facility])    a. 2009, saw  cardiology, declined ABIs.  [name] COPD (chronic obstructive pulmonary disease) (HCC)    states he has been told by MD he has copd  .  Depression   . DJD (degenerative joint disease)   . Dyslipidemia 2016  . HTN (hypertension)   . Ischemic cardiomyopathy    a. 05/2015: EF 35% at cath, 35-40% by echo. b. EF improved to 55-60% by echo 08/2015.  [name] Pancreatitis, gallstone 05/2013  . RBBB   . STEMI (ST elevation myocardial infarction) (HCC) 05/20/15   3.0 x 28 mm Promus premier DES to the LAD    Past Surgical History:  Procedure Laterality Date  . CARDIAC CATHETERIZATION N/A 05/20/2015   Procedure: Left Heart Cath and Coronary Angiography;  Surgeon: [name], MD; LAD 99%, circumflex 50%, RCA 40%, EF 35% with akinesis in the mid anterior wall apex and inferior apex   . CARDIAC CATHETERIZATION N/A 05/20/2015   Procedure: Coronary Stent Intervention;  Surgeon: [name], MD;  3.0 x 28 mm Promus premier DES to the LAD  . CHOLECYSTECTOMY N/A 06/06/2013   Procedure: LAPAROSCOPIC CHOLECYSTECTOMY WITH INTRAOPERATIVE CHOLANGIOGRAM;  Surgeon: Liz Malady, MD;  Location: [facility] OR;  Service: General;  Laterality: N/A;  . Glanglian cyst removal Left 1970's  . myleogram  1979  . TOTAL KNEE ARTHROPLASTY Right 02-2011   Dr Despina Hick    Family History  Problem Relation Age of Onset  . Pancreatic cancer Mother   . Coronary artery disease Mother   . Coronary artery disease Father     several MI'S  . Breast cancer Sister   . Diabetes Neg Hx   . Lung disease Neg Hx     Social History   Social History  . Marital status: Married    Spouse name: N/A  . Number of children: N/A  . Years of education: N/A   Social History Main Topics  . Smoking status: Former Smoker    Packs/day: 2.00    Years: 49.00    Types: Cigarettes    Start date: 04/26/1939    Quit date: 12/10/1987  . Smokeless tobacco: Never Used  . Alcohol use No  . Drug use: No  . Sexual activity: Not Asked   Other Topics Concern  . None   Social History Narrative   [facility] (02/12/17):   Originally from [place]. Previously service in the [employer] & was in  [place], [place], & [place]. 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 No pets currently. No bird or mold exposure. Doesn't have any hobbies currently. Previously enjoyed square dancing.       Objective:   Physical Exam  BP 128/82 (BP Location: Right Arm, Patient Position: Sitting, Cuff Size: Normal)   Pulse 61   Ht 5\' 10"  (1.778 m)   Wt 213 lb (96.6 kg)   SpO2 93%   BMI 30.56 kg/m  General:  Awake. Alert. No acute  distress. Accompanied by wife today. Integument:  Warm & dry. No rash on exposed skin. No bruising. Extremities:  No cyanosis or clubbing.  Lymphatics:  No appreciated cervical or supraclavicular lymphadenoapthy. HEENT:  Moist mucus membranes. No oral ulcers. No scleral injection or icterus. Moderate bilateral nasal turbinate swelling with erythematous mucosa. Cardiovascular:  Regular  rhythm No edema.  No murmur, rub, or gallop appreciated.  Pulmonary:  Good aeration bilaterally but faint end expiratory wheeze. Symmetric chest wall expansion. No accessory muscle use. Abdomen: Soft. Normal bowel sounds.  Protuberant. Grossly nontender. Musculoskeletal:  Normal bulk and tone. Hand grip strength 5/5 bilaterally. No joint deformity or effusion appreciated. Neurological:  CN 2-12 gr ossly in tact. No meningismus. Moving all 4 extremities equally. Symmetric brachioradialis deep tendon reflexes. Psychiatric:  Mood and affect congruent. Speech normal rhythm, rate & tone.   PFT 01/28/13: FVC 3.50 L (81%) FEV1 1.94 L (70%) (FEV1/FVC 0.55 FEF 25-75 0.71 L (30%) negative bronchodilator response TLC 6.14 L (93%) RV 96% ERV 47% DLCO uncorrected 87%  IMAGING CXR PA/LAT 01/20/17 (personally reviewed by me):  Hyperinflation suggested with flattening of the diaphragms. No parenchymal mass or opacity appreciated. No pleural effusion. Heart normal in size & mediastinum normal in contour.  CARDIAC TTE (09/06/15): LV normal in size with  EF 55-60%. No regional wall motion abnormalities & grade 1 diastolic dysfunction. LA & RA normal in size. RV normal in size and function. Mild aortic stenosis without regurgitation. Aortic root normal in size. No mitral stenosis or regurgitation. No pulmonic stenosis or regurgitation. Trivial tricuspid regurgitation. No pericardial effusion.    Assessment & Plan:  83 y.o. male with mild COPD based on pulmonary function testing from 2014 by my review. Patient does have a known history of asbestos exposure but on my review of his x-ray imaging there is no evidence of pleural or parenchymal disease. I do question whether or not he may benefit from alternative inhaler therapies as I can only ascertain he was tried on Anoro in 2017. Before starting a new medication we will need to establish his severity of lung disease as well as screening him for alpha-1 antitrypsin deficiency. I instructed the patient contact my office if he had any new breathing problems and encouraged him to try using the DuoNeb that was prescribed him to see if this had an effect on his exertional capabilities. I did caution him that he would not likely notice a change in the way he feels.  1. Mild COPD: Screening for alpha-1 antitrypsin deficiency. Repeating full pulmonary function testing as well as 6 minute walk test. Holding off on initiating new inhaled medications at this time. 2. Asbestos exposure: Continuing to monitor with yearly x-ray imaging and full pulmonary function testing.  3. Health maintenance:  Patient declines immunizations today. 4. Follow-up: Return to clinic in 4 weeks or sooner if needed.  Donna Christen Jamison Neighbor, M.D. [facility] Pager:  [phone] After 3pm or if no response, call [phone] 10:08 AM 02/12/17

## 2017-02-12 NOTE — Patient Instructions (Addendum)
   Call me if you have any new breathing problems or questions before your next appointment.  Try using the Duoneb nebulizer medication to see if this helps your breathing but remember to exert yourself afterwards to see if it has an effect because you may feel no different.   I will review your test results when I see you back.   TESTS ORDERED: 1. Serum Alpha-1 Antitrypsin Phenotype 2. Full PFTs on or before appointment 3. on room air on or before next appointment

## 2017-02-15 LAB — ALPHA-1 ANTITRYPSIN PHENOTYPE: A1 ANTITRYPSIN: 89 mg/dL (ref 83–199)

## 2017-02-21 ENCOUNTER — Encounter (INDEPENDENT_AMBULATORY_CARE_PROVIDER_SITE_OTHER): Payer: Medicare Other | Admitting: Ophthalmology

## 2017-02-21 DIAGNOSIS — H35033 Hypertensive retinopathy, bilateral: Secondary | ICD-10-CM

## 2017-02-21 DIAGNOSIS — H2513 Age-related nuclear cataract, bilateral: Secondary | ICD-10-CM

## 2017-02-21 DIAGNOSIS — H34831 Tributary (branch) retinal vein occlusion, right eye, with macular edema: Secondary | ICD-10-CM

## 2017-02-21 DIAGNOSIS — H43813 Vitreous degeneration, bilateral: Secondary | ICD-10-CM

## 2017-02-21 DIAGNOSIS — I10 Essential (primary) hypertension: Secondary | ICD-10-CM

## 2017-02-24 ENCOUNTER — Telehealth: Payer: Self-pay | Admitting: Internal Medicine

## 2017-02-24 NOTE — Telephone Encounter (Signed)
Spoke with patient's wife. She stated that Mr. Jon Baker would like to give office a call back to schedule appt.

## 2017-03-11 DIAGNOSIS — H2511 Age-related nuclear cataract, right eye: Secondary | ICD-10-CM | POA: Diagnosis not present

## 2017-03-11 DIAGNOSIS — H25011 Cortical age-related cataract, right eye: Secondary | ICD-10-CM | POA: Diagnosis not present

## 2017-03-11 DIAGNOSIS — H25041 Posterior subcapsular polar age-related cataract, right eye: Secondary | ICD-10-CM | POA: Diagnosis not present

## 2017-03-12 DIAGNOSIS — H2512 Age-related nuclear cataract, left eye: Secondary | ICD-10-CM | POA: Diagnosis not present

## 2017-03-18 DIAGNOSIS — H2512 Age-related nuclear cataract, left eye: Secondary | ICD-10-CM | POA: Diagnosis not present

## 2017-03-22 ENCOUNTER — Other Ambulatory Visit: Payer: Self-pay | Admitting: Medical

## 2017-04-03 ENCOUNTER — Ambulatory Visit (INDEPENDENT_AMBULATORY_CARE_PROVIDER_SITE_OTHER): Payer: Medicare Other | Admitting: Pulmonary Disease

## 2017-04-03 ENCOUNTER — Telehealth: Payer: Self-pay | Admitting: Pulmonary Disease

## 2017-04-03 ENCOUNTER — Ambulatory Visit (INDEPENDENT_AMBULATORY_CARE_PROVIDER_SITE_OTHER): Payer: Medicare Other | Admitting: *Deleted

## 2017-04-03 ENCOUNTER — Encounter: Payer: Self-pay | Admitting: Pulmonary Disease

## 2017-04-03 VITALS — BP 124/82 | HR 50 | Ht 70.0 in | Wt 215.0 lb

## 2017-04-03 DIAGNOSIS — J984 Other disorders of lung: Secondary | ICD-10-CM | POA: Diagnosis not present

## 2017-04-03 DIAGNOSIS — J449 Chronic obstructive pulmonary disease, unspecified: Secondary | ICD-10-CM | POA: Diagnosis not present

## 2017-04-03 DIAGNOSIS — Z148 Genetic carrier of other disease: Secondary | ICD-10-CM

## 2017-04-03 DIAGNOSIS — Z77090 Contact with and (suspected) exposure to asbestos: Secondary | ICD-10-CM

## 2017-04-03 DIAGNOSIS — E8801 Alpha-1-antitrypsin deficiency: Secondary | ICD-10-CM | POA: Diagnosis not present

## 2017-04-03 DIAGNOSIS — R0609 Other forms of dyspnea: Secondary | ICD-10-CM

## 2017-04-03 LAB — PULMONARY FUNCTION TEST
DL/VA % PRED: 71 %
DL/VA: 3.26 ml/min/mmHg/L
DLCO UNC % PRED: 50 %
DLCO UNC: 16.23 ml/min/mmHg
DLCO cor % pred: 49 %
DLCO cor: 15.97 ml/min/mmHg
FEF 25-75 PRE: 0.34 L/s
FEF 25-75 Post: 1.08 L/sec
FEF2575-%CHANGE-POST: 215 %
FEF2575-%Pred-Post: 59 %
FEF2575-%Pred-Pre: 18 %
FEV1-%Change-Post: 48 %
FEV1-%Pred-Post: 52 %
FEV1-%Pred-Pre: 35 %
FEV1-PRE: 0.97 L
FEV1-Post: 1.43 L
FEV1FVC-%Change-Post: 12 %
FEV1FVC-%PRED-PRE: 71 %
FEV6-%Change-Post: 36 %
FEV6-%PRED-POST: 66 %
FEV6-%Pred-Pre: 48 %
FEV6-POST: 2.41 L
FEV6-Pre: 1.77 L
FEV6FVC-%CHANGE-POST: 2 %
FEV6FVC-%PRED-POST: 102 %
FEV6FVC-%Pred-Pre: 99 %
FVC-%Change-Post: 32 %
FVC-%Pred-Post: 64 %
FVC-%Pred-Pre: 49 %
FVC-Post: 2.53 L
FVC-Pre: 1.92 L
Post FEV1/FVC ratio: 57 %
Post FEV6/FVC ratio: 95 %
Pre FEV1/FVC ratio: 51 %
Pre FEV6/FVC Ratio: 93 %

## 2017-04-03 NOTE — Patient Instructions (Addendum)
   Call me with a list of the inhalers your insurance company will cover and we will pick one to get you a sample to try.  I want to do some breathing and walking tests when you come back to see how your lungs are working on inhaler medication.  I'm checking a CT of your chest to make sure the asbestos is not causing any lung disease.  Remember to talk to your children about your Alpha-1 Antitrypsin carrier status and feel free to call me if they have any questions.   TESTS ORDERED: 1. HRCT Chest w/o 2. Spirometry with bronchodilator challenge & DLCO at next appointment 3. with oxygen titration at next appointment

## 2017-04-03 NOTE — Progress Notes (Signed)
PFT done today. 

## 2017-04-03 NOTE — Addendum Note (Signed)
Addended by: Pamalee Leyden on: 04/03/2017 02:30 PM   Modules accepted: Orders

## 2017-04-03 NOTE — Progress Notes (Signed)
Subjective:    Patient ID: Jon Baker, male    DOB: [birth year], 83 y.o.   MRN: [id number]  C.C.:  Follow-up for Severe COPD & H/O Asbestos Exposure.  HPI Severe COPD: Patient is a carrier for alpha-1 antitrypsin deficiency. Previously tried on Anoro in October 2017 & has been on Duoneb as of February. H reports his level of dyspnea is unchanged. He does have a cough intermittently productive of a clear phlegm. He does note wheezing at times. He reports he does use a Duoneb once daily as well as his albuterol rescue inhaler without symptomatic relief. He has also been using an Atrovent inhaler recently but doesn't feel it is helping either.   H/O Asbestos Exposure:  Patient does have mild restriction on lung volumes.   Review of Systems Reports he does have intermittent chest pain, pressure or exertion. He does have frequent near syncope. He reports infrequent headaches. He reports only "occasional" emesis. Denies any abdominal pain.   No Known Allergies  Current Outpatient Prescriptions on File Prior to Visit  Medication Sig Dispense Refill  . albuterol (PROVENTIL HFA;VENTOLIN HFA) 108 (90 Base) MCG/ACT inhaler Inhale 2 puffs into the lungs every 6 (six) hours as needed for wheezing or shortness of breath. 1 Inhaler 5  . aspirin EC 81 MG tablet Take 1 tablet (81 mg total) by mouth daily. 100 tablet 3  . atorvastatin (LIPITOR) 80 MG tablet TAKE 1 TABLET (80 MG TOTAL) BY MOUTH DAILY AT 6 PM. 30 tablet 11  . carvedilol (COREG) 25 MG tablet TAKE 1 TABLET (25 MG TOTAL) BY MOUTH 2 (TWO) TIMES DAILY WITH A MEAL. 60 tablet 11  . cholecalciferol (VITAMIN D) 1000 UNITS tablet Take 1,000 Units by mouth daily.    . clopidogrel (PLAVIX) 75 MG tablet TAKE 1 TABLET BY MOUTH DAILY. 06/09/2015 TAKE 4 TABLETS BY MOUTH FOR FIRST DOSE ONLY. 90 tablet 1  . hydrochlorothiazide (MICROZIDE) 12.5 MG capsule Take 1 capsule (12.5 mg total) by mouth daily. 30 capsule 0  . ipratropium-albuterol (DUONEB) 0.5-2.5 (3)  MG/3ML SOLN Take 3 mLs by nebulization every 6 (six) hours as needed. 360 mL 3  . losartan (COZAAR) 100 MG tablet Take 1 tablet (100 mg total) by mouth daily. 30 tablet 11  . nitroGLYCERIN (NITROSTAT) 0.4 MG SL tablet Place 1 tablet (0.4 mg total) under the tongue every 5 (five) minutes as needed for chest pain. 25 tablet 3  . pantoprazole (PROTONIX) 40 MG tablet Take 1 tablet (40 mg total) by mouth daily. 30 tablet 11   Current Facility-Administered Medications on File Prior to Visit  Medication Dose Route Frequency Provider Last Rate Last Dose  . ipratropium-albuterol (DUONEB) 0.5-2.5 (3) MG/3ML nebulizer solution 3 mL  3 mL Nebulization Q6H Esperanza Richters, PA-C        Past Medical History:  Diagnosis Date  . Aortic stenosis    a. Mild by echo 08/2015.   [name] CAD (coronary artery disease)    a. STEMI 05/2015 s/p DES to LAD.  [name] [facility])    a. 2009, saw  cardiology, declined ABIs.  [name] COPD (chronic obstructive pulmonary disease) (HCC)    states he has been told by MD he has copd  . Depression   . DJD (degenerative joint disease)   . Dyslipidemia 2016  . HTN (hypertension)   . Ischemic cardiomyopathy    a. 05/2015: EF 35% at cath, 35-40% by echo. b. EF improved to 55-60% by echo 08/2015.  [name] Pancreatitis, gallstone 05/2013  .  RBBB   . STEMI (ST elevation myocardial infarction) (HCC) 05/20/15   3.0 x 28 mm Promus premier DES to the LAD    Past Surgical History:  Procedure Laterality Date  . CARDIAC CATHETERIZATION N/A 05/20/2015   Procedure: Left Heart Cath and Coronary Angiography;  Surgeon: [name], MD; LAD 99%, circumflex 50%, RCA 40%, EF 35% with akinesis in the mid anterior wall apex and inferior apex   . CARDIAC CATHETERIZATION N/A 05/20/2015   Procedure: Coronary Stent Intervention;  Surgeon: [name], MD;  3.0 x 28 mm Promus premier DES to the LAD  . CHOLECYSTECTOMY N/A 06/06/2013   Procedure: LAPAROSCOPIC CHOLECYSTECTOMY WITH INTRAOPERATIVE CHOLANGIOGRAM;   Surgeon: Liz Malady, MD;  Location: [facility] OR;  Service: General;  Laterality: N/A;  . Glanglian cyst removal Left 1970's  . myleogram  1979  . TOTAL KNEE ARTHROPLASTY Right 02-2011   Dr Despina Hick    Family History  Problem Relation Age of Onset  . Pancreatic cancer Mother   . Coronary artery disease Mother   . Coronary artery disease Father     several MI'S  . Breast cancer Sister   . Diabetes Neg Hx   . Lung disease Neg Hx     Social History   Social History  . Marital status: Married    Spouse name: N/A  . Number of children: N/A  . Years of education: N/A   Social History Main Topics  . Smoking status: Former Smoker    Packs/day: 2.00    Years: 49.00    Types: Cigarettes    Start date: 04/26/1939    Quit date: 12/10/1987  . Smokeless tobacco: Never Used  . Alcohol use No  . Drug use: No  . Sexual activity: Not Asked   Other Topics Concern  . None   Social History Narrative   [facility] (02/12/17):   Originally from [facility]. 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 No pets currently. No bird or mold exposure. Doesn't have any hobbies currently. Previously enjoyed square dancing.       Objective:   Physical Exam  BP 124/82 (BP Location: Left Arm, Patient Position: Sitting, Cuff Size: Normal)   Pulse (!) 50   Ht 5\' 10"  (1.778 m)   Wt 215 lb (97.5 kg)   SpO2 93%   BMI 30.85 kg/m   Gen.: No distress. Wife with patient today. Awake. Integument: No rash on exposed skin. Warm and dry. HEENT: Normal bilateral tympanic membranes. Mild bilateral nasal turbinate swelling. No oral ulcers. Pulmonary: Good aeration & clear to auscultation bilaterally. No accessory muscle use on room air. Cardiovascular: Regular rate. Regular rhythm. No edema. Abdomen: Soft. Protuberant. Normal bowel sounds.  PFT 04/03/17: FVC 1.92 L (49%) FEV1 0.97 L (35%)  FEV1/FVC 0.51 FEF 25-75 0.34 L (18%) positive bronchodilator response TLC 4.95 L (70%) RV 115% ERV 112% DLCO corrected 49% 01/28/13: FVC 3.50 L (81%) FEV1 1.94 L (70%) FEV1/FVC 0.55 FEF 25-75 0.71 L (30%) negative bronchodilator response TLC 6.14 L (93%) RV 96% ERV 47% DLCO uncorrected 87%  04/03/17:  Walked 275 meters / Baseline Sat 96% on RA / Nadir Sat 88% @ 4:24 (required 2 L/m with exertion & patient did have wheezing)  IMAGING CXR PA/LAT 01/20/17 (previously reviewed by me):  Hyperinflation suggested with flattening of the diaphragms. No parenchymal mass or opacity appreciated.  No pleural effusion. Heart normal in size & mediastinum normal in contour.  CARDIAC TTE (09/06/15): LV normal in size with EF 55-60%. No regional wall motion abnormalities & grade 1 diastolic dysfunction. LA & RA normal in size. RV normal in size and function. Mild aortic stenosis without regurgitation. Aortic root normal in size. No mitral stenosis or regurgitation. No pulmonic stenosis or regurgitation. Trivial tricuspid regurgitation. No pericardial effusion.  LABS 02/12/17 Alpha-1 antitrypsin: MZ (89)    Assessment & Plan:  83 y.o. male with severe COPD and alpha-1 antitrypsin carrier status. Patient's spirometry today does show a severe amount of obstruction with a significant bronchodilator response. Interestingly he does have mild restriction as well as an oxygen requirement with exertion. Patient also has known history of asbestos exposure. Lengthy discussion with the patient today regarding his alpha-1 antitrypsin carrier status. Certainly with his prior tobacco use this could have contributed to his development of COPD. I am concerned that he may have parenchymal disease from his prior asbestos exposure which could be contributing to his restriction and hypoxia on exertion. Alternatively, the restriction could be due to his central obesity. I instructed the patient to contact my office if he had any new  breathing problems or questions before his next appointment.  1. Severe COPD: Patient will contact my office with list of inhalers covered by his insurance to allow for sample testing and identify an inhaler he can tolerate. Plan for repeat spirometry with bronchodilator challenge and DLCO at next appointment as well as 6 minute walk test with oxygen titration. 2. Hypoxia with exertion: Holding off on oxygen therapy. Initiating treatment with inhaler medication as noted above. Repeat 6 minute walk test with oxygen titration at next appointment. 3. Restrictive lung disease: Checking high-resolution CT chest without contrast. 4. History of asbestos exposure: Checking high-resolution CT chest without contrast. 5. Health maintenance: Patient previously declined immunizations. 6. Follow-up: Return to clinic in 3 weeks or sooner if needed.  Donna Christen Jamison Neighbor, M.D. [facility] Pager:  [phone] After 3pm or if no response, call [phone] 1:59 PM 04/03/17

## 2017-04-03 NOTE — Telephone Encounter (Signed)
lmtcb x1 for pt. 

## 2017-04-03 NOTE — Progress Notes (Signed)
SIX MIN WALK 04/03/2017  Medications Vitamin D 1000 units, Cozaar 100mg   Supplimental Oxygen during Test? (L/min) Yes  O2 Flow Rate 2  Type Continuous  Laps 5  Partial Lap (in Meters) 35  Baseline BP (sitting) 132/84  Baseline Heartrate 53  Baseline Dyspnea (Borg Scale) 3  Baseline Fatigue (Borg Scale) 3  Baseline SPO2 96  BP (sitting) 154/90  Heartrate 73  Dyspnea (Borg Scale) 4  Fatigue (Borg Scale) 5  SPO2 95  BP (sitting) 136/84  Heartrate 57  SPO2 98  Stopped or Paused before Six Minutes Yes  Other Symptoms at end of Exercise pt 's O2 did drop to 88-89% on room air.  pt began wheezing with 36sec left on the clock and 2lpm were applied - sats quickly recovered to 96% 2lpm at rest.  pt resumed walking to finish the test and sats stayed at 95% on 2lpm with slight wheezing at completion of test.  Distance Completed 275

## 2017-04-07 MED ORDER — UMECLIDINIUM BROMIDE 62.5 MCG/INH IN AEPB: 1.0000 | INHALATION_SPRAY | Freq: Every day | RESPIRATORY_TRACT | 0 refills | Status: DC

## 2017-04-07 NOTE — Telephone Encounter (Signed)
Spoke with pt, verified that Incruse is the only covered inhaler on formulary and must try and fail this before any PA can be done for another inhaler.  Sample left up front for pickup at pt request. Nothing further needed at this time.

## 2017-04-10 ENCOUNTER — Encounter (INDEPENDENT_AMBULATORY_CARE_PROVIDER_SITE_OTHER): Payer: Medicare Other | Admitting: Ophthalmology

## 2017-04-10 DIAGNOSIS — H43813 Vitreous degeneration, bilateral: Secondary | ICD-10-CM | POA: Diagnosis not present

## 2017-04-10 DIAGNOSIS — H34831 Tributary (branch) retinal vein occlusion, right eye, with macular edema: Secondary | ICD-10-CM

## 2017-04-10 DIAGNOSIS — H35033 Hypertensive retinopathy, bilateral: Secondary | ICD-10-CM | POA: Diagnosis not present

## 2017-04-10 DIAGNOSIS — I10 Essential (primary) hypertension: Secondary | ICD-10-CM

## 2017-04-11 ENCOUNTER — Ambulatory Visit (INDEPENDENT_AMBULATORY_CARE_PROVIDER_SITE_OTHER)
Admission: RE | Admit: 2017-04-11 | Discharge: 2017-04-11 | Disposition: A | Discharge status: Home / Self Care | Payer: Medicare Other | Source: Ambulatory Visit | Attending: Pulmonary Disease | Admitting: Pulmonary Disease

## 2017-04-11 DIAGNOSIS — J984 Other disorders of lung: Secondary | ICD-10-CM

## 2017-04-11 DIAGNOSIS — R918 Other nonspecific abnormal finding of lung field: Secondary | ICD-10-CM | POA: Diagnosis not present

## 2017-04-11 DIAGNOSIS — J449 Chronic obstructive pulmonary disease, unspecified: Secondary | ICD-10-CM

## 2017-04-11 DIAGNOSIS — Z77090 Contact with and (suspected) exposure to asbestos: Secondary | ICD-10-CM

## 2017-04-15 NOTE — Progress Notes (Signed)
Left message for patient to contact office for results.

## 2017-04-16 ENCOUNTER — Telehealth: Payer: Self-pay | Admitting: Pulmonary Disease

## 2017-04-16 NOTE — Telephone Encounter (Signed)
Roslynn Amble, MD  Pamalee Leyden, RN        Please let the patient know that I reviewed his chest CT scan. There is some scar tissue formation in the bottoms of his lungs that could be due to his prior asbestos exposure. It appears relatively mild and explains the findings on his pulmonary function testing previously. We will review this at his follow-up appointment as planned. Thank you.    Spoke with pt, aware of results/recs.  Nothing further needed.

## 2017-04-22 ENCOUNTER — Telehealth: Payer: Self-pay | Admitting: Physician Assistant

## 2017-04-24 NOTE — Telephone Encounter (Signed)
close encounter °

## 2017-04-25 ENCOUNTER — Ambulatory Visit: Payer: Medicare Other | Admitting: Physician Assistant

## 2017-05-09 ENCOUNTER — Encounter: Payer: Self-pay | Admitting: Cardiovascular Disease

## 2017-05-09 ENCOUNTER — Ambulatory Visit (INDEPENDENT_AMBULATORY_CARE_PROVIDER_SITE_OTHER): Payer: Medicare Other | Admitting: Cardiovascular Disease

## 2017-05-09 DIAGNOSIS — I2102 ST elevation (STEMI) myocardial infarction involving left anterior descending coronary artery: Secondary | ICD-10-CM | POA: Diagnosis not present

## 2017-05-09 DIAGNOSIS — E782 Mixed hyperlipidemia: Secondary | ICD-10-CM

## 2017-05-09 DIAGNOSIS — I10 Essential (primary) hypertension: Secondary | ICD-10-CM | POA: Diagnosis not present

## 2017-05-09 DIAGNOSIS — I2109 ST elevation (STEMI) myocardial infarction involving other coronary artery of anterior wall: Secondary | ICD-10-CM | POA: Diagnosis not present

## 2017-05-09 NOTE — Progress Notes (Signed)
05/09/2017 Jon Baker   04-21-1932  [id number]  Primary Physician [name], Georgina Quint, MD Primary Cardiologist: Runell Gess MD [name]  HPI:  Jon Baker is an 83 year old married Caucasian male, retired Retail buyer, with a history of hypertension and dyslipidemia as well as COPD followed by Dr. [name] . I last saw him in the office 10/25/15. He was seen by myself in [facility] emergency room on 05/20/15 with an EKG that showed anterior ST segment elevation myocardial infarction. He was taken to the cardiac catheterization laboratory by Dr. Excell Seltzer where he had a 99% proximal LAD lesion. Went aspiration thrombectomy and stenting using a drug-eluting stent. Otherwise he had noncritical CAD with an anteroapical wall motion and LV, and ejection fraction of 35-40%. His Brilenta was ultimately changed to Plavix because of symptoms of shortness of breath.. He does have COPD and dyspnea on exertion but denies chest pain.his P2Y12. Verify now platelet reactivity test was favorable at 176. A 2 -D echocardiogram performed 08/29/15 revealed improvement in his ejection fraction up to 60% without wall motion abnormalities. Since I saw him in November 2016 he is done well. He continues to complain of shortness of breath and has seen a pulmonologist who placed him on inhaled bronchodilators.   Current Outpatient Prescriptions  Medication Sig Dispense Refill  . albuterol (PROVENTIL HFA;VENTOLIN HFA) 108 (90 Base) MCG/ACT inhaler Inhale 2 puffs into the lungs every 6 (six) hours as needed for wheezing or shortness of breath. 1 Inhaler 5  . aspirin EC 81 MG tablet Take 1 tablet (81 mg total) by mouth daily. 100 tablet 3  . atorvastatin (LIPITOR) 80 MG tablet TAKE 1 TABLET (80 MG TOTAL) BY MOUTH DAILY AT 6 PM. 30 tablet 11  . carvedilol (COREG) 25 MG tablet TAKE 1 TABLET (25 MG TOTAL) BY MOUTH 2 (TWO) TIMES DAILY WITH A MEAL. 60 tablet 11  . cholecalciferol (VITAMIN D)  1000 UNITS tablet Take 1,000 Units by mouth daily.    . clopidogrel (PLAVIX) 75 MG tablet TAKE 1 TABLET BY MOUTH DAILY. 06/09/2015 TAKE 4 TABLETS BY MOUTH FOR FIRST DOSE ONLY. 90 tablet 1  . hydrochlorothiazide (MICROZIDE) 12.5 MG capsule TAKE 1 CAPSULE (12.5 MG TOTAL) BY MOUTH DAILY. 30 capsule 0  . ipratropium-albuterol (DUONEB) 0.5-2.5 (3) MG/3ML SOLN Take 3 mLs by nebulization every 6 (six) hours as needed. 360 mL 3  . losartan (COZAAR) 100 MG tablet Take 1 tablet (100 mg total) by mouth daily. 30 tablet 11  . nitroGLYCERIN (NITROSTAT) 0.4 MG SL tablet Place 1 tablet (0.4 mg total) under the tongue every 5 (five) minutes as needed for chest pain. 25 tablet 3  . pantoprazole (PROTONIX) 40 MG tablet Take 1 tablet (40 mg total) by mouth daily. 30 tablet 11  . umeclidinium bromide (INCRUSE ELLIPTA) 62.5 MCG/INH AEPB Inhale 1 puff into the lungs daily. 2 each 0   Current Facility-Administered Medications  Medication Dose Route Frequency Provider Last Rate Last Dose  . ipratropium-albuterol (DUONEB) 0.5-2.5 (3) MG/3ML nebulizer solution 3 mL  3 mL Nebulization Q6H Saguier, Ramon Dredge, PA-C        No Known Allergies  Social History   Social History  . Marital status: Married    Spouse name: N/A  . Number of children: N/A  . Years of education: N/A   Occupational History  . Not on file.   Social History Main Topics  . Smoking status: Former Smoker    Packs/day: 2.00  Years: 49.00    Types: Cigarettes    Start date: 04/26/1939    Quit date: 12/10/1987  . Smokeless tobacco: Never Used  . Alcohol use No  . Drug use: No  . Sexual activity: Not on file   Other Topics Concern  . Not on file   Social History Narrative   Jon Baker (02/12/17):   Originally from [facility]. 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 No pets currently. No bird or mold exposure. Doesn't  have any hobbies currently. Previously enjoyed square dancing.      Review of Systems: General: negative for chills, fever, night sweats or weight changes.  Cardiovascular: negative for chest pain, dyspnea on exertion, edema, orthopnea, palpitations, paroxysmal nocturnal dyspnea or shortness of breath Dermatological: negative for rash Respiratory: negative for cough or wheezing Urologic: negative for hematuria Abdominal: negative for nausea, vomiting, diarrhea, bright red blood per rectum, melena, or hematemesis Neurologic: negative for visual changes, syncope, or dizziness All other systems reviewed and are otherwise negative except as noted above.    Blood pressure 120/72, pulse (!) 54, height 5\' 10"  (1.778 m), weight 213 lb (96.6 kg), SpO2 93 %.  General appearance: alert and no distress Neck: no adenopathy, no carotid bruit, no JVD, supple, symmetrical, trachea midline and thyroid not enlarged, symmetric, no tenderness/mass/nodules Lungs: clear to auscultation bilaterally Heart: regular rate and rhythm, S1, S2 normal, no murmur, click, rub or gallop Extremities: extremities normal, atraumatic, no cyanosis or edema  EKG sinus bradycardia at 49 with right bundle branch block and septal Q waves are personally reviewed this EKG.  ASSESSMENT AND PLAN:   HYPERLIPIDEMIA History of hyperlipidemia on atorvastatin followed by his PCP.  Essential hypertension History of hypertension blood pressure measured at 120/72. He is on carvedilol, hydrochlorothiazide and losartan. Continue current meds at current dosing  STEMI (ST elevation myocardial infarction) History of CAD status post acute anterior STEMI treated with aspiration thrombectomy, PCI and stenting of the proximal LAD by Dr. Excell Seltzer 05/20/15. Visit initial ejection fraction was 35-40% which ultimately normalized by 2-D echo 08/29/15. He was transitioned from Rosa Sanchez to Plavix. He is chronically short of breath because of COPD but denies  chest pain.      Runell Gess MD FACP,FACC,[name], [facility] 05/09/2017 11:27 AM

## 2017-05-09 NOTE — Assessment & Plan Note (Signed)
History of hypertension blood pressure measured at 120/72. He is on carvedilol, hydrochlorothiazide and losartan. Continue current meds at current dosing

## 2017-05-09 NOTE — Assessment & Plan Note (Signed)
History of CAD status post acute anterior STEMI treated with aspiration thrombectomy, PCI and stenting of the proximal LAD by Dr. Excell Seltzer 05/20/15. Visit initial ejection fraction was 35-40% which ultimately normalized by 2-D echo 08/29/15. He was transitioned from [place] to Plavix. He is chronically short of breath because of COPD but denies chest pain.

## 2017-05-09 NOTE — Assessment & Plan Note (Signed)
History of hyperlipidemia on atorvastatin followed by his PCP 

## 2017-05-09 NOTE — Patient Instructions (Addendum)

## 2017-05-13 NOTE — Addendum Note (Signed)
Addended by: Beryle Quant on: 05/13/2017 11:16 AM   Modules accepted: Orders

## 2017-05-22 ENCOUNTER — Encounter (INDEPENDENT_AMBULATORY_CARE_PROVIDER_SITE_OTHER): Payer: Medicare Other | Admitting: Ophthalmology

## 2017-05-22 DIAGNOSIS — I10 Essential (primary) hypertension: Secondary | ICD-10-CM | POA: Diagnosis not present

## 2017-05-22 DIAGNOSIS — H35033 Hypertensive retinopathy, bilateral: Secondary | ICD-10-CM | POA: Diagnosis not present

## 2017-05-22 DIAGNOSIS — H34831 Tributary (branch) retinal vein occlusion, right eye, with macular edema: Secondary | ICD-10-CM

## 2017-05-22 DIAGNOSIS — H43813 Vitreous degeneration, bilateral: Secondary | ICD-10-CM | POA: Diagnosis not present

## 2017-05-22 NOTE — Telephone Encounter (Signed)
Addendum: Samples for Incruse were placed up front to pick up 04/07/17. These were never picked up.  Samples have been logged back in and put back on the shelf. Nothing further needed.

## 2017-05-28 ENCOUNTER — Other Ambulatory Visit: Payer: Self-pay | Admitting: Cardiovascular Disease

## 2017-05-29 NOTE — Telephone Encounter (Signed)
Rx(s) sent to pharmacy electronically.  

## 2017-06-19 ENCOUNTER — Other Ambulatory Visit: Payer: Self-pay

## 2017-06-19 MED ORDER — ATORVASTATIN CALCIUM 80 MG PO TABS: ORAL_TABLET | ORAL | 11 refills | Status: DC

## 2017-07-02 ENCOUNTER — Ambulatory Visit: Payer: Medicare Other | Admitting: Pulmonary Disease

## 2017-07-02 ENCOUNTER — Ambulatory Visit: Payer: Medicare Other

## 2017-07-08 ENCOUNTER — Telehealth: Payer: Self-pay | Admitting: Internal Medicine

## 2017-07-08 NOTE — Telephone Encounter (Signed)
Patient Name: Jon Baker  DOB: 02/03/1932    Initial Comment Caller's husband's feet are so swollen and wants to know if he can get a medication called in.    Nurse Assessment  Nurse: Stefano Gaul, RN, Dwana Curd Date/Time (Eastern Time): 07/08/2017 1:19:37 PM  Confirm and document reason for call. If symptomatic, describe symptoms. ---Caller states spouse's feet and ankles are very swollen. He has COPD. legs are swollen up to his knees. No more SOB than usual.  Does the patient have any new or worsening symptoms? ---Yes  Will a triage be completed? ---Yes  Related visit to physician within the last 2 weeks? ---No  Does the PT have any chronic conditions? (i.e. diabetes, asthma, etc.) ---Yes  List chronic conditions. ---MI; COPD  Is this a behavioral health or substance abuse call? ---No     Guidelines    Guideline Title Affirmed Question Affirmed Notes  Leg Swelling and Edema [1] MODERATE leg swelling (e.g., swelling extends up to knees) AND [2] new onset or worsening    Final Disposition User   See Physician within 24 Hours Valentine, RN, Dwana Curd    Comments  spouse states that pt won't come to office for appt. She wants to know if medication can be called into [facility] on [address] rather than making appt. Please call back regarding medication.   Referrals  GO TO FACILITY REFUSED   Disagree/Comply: Disagree  Disagree/Comply Reason: Disagree with instructions

## 2017-07-08 NOTE — Telephone Encounter (Signed)
Please advise in Dr. Plotnikovs absence 

## 2017-07-08 NOTE — Telephone Encounter (Signed)
Needs OV.  

## 2017-07-09 ENCOUNTER — Encounter: Payer: Self-pay | Admitting: Internal Medicine

## 2017-07-09 ENCOUNTER — Ambulatory Visit (INDEPENDENT_AMBULATORY_CARE_PROVIDER_SITE_OTHER): Payer: Medicare Other | Admitting: Internal Medicine

## 2017-07-09 ENCOUNTER — Other Ambulatory Visit (INDEPENDENT_AMBULATORY_CARE_PROVIDER_SITE_OTHER): Payer: Medicare Other

## 2017-07-09 DIAGNOSIS — R0600 Dyspnea, unspecified: Secondary | ICD-10-CM

## 2017-07-09 DIAGNOSIS — R609 Edema, unspecified: Secondary | ICD-10-CM | POA: Insufficient documentation

## 2017-07-09 DIAGNOSIS — R600 Localized edema: Secondary | ICD-10-CM

## 2017-07-09 DIAGNOSIS — I2510 Atherosclerotic heart disease of native coronary artery without angina pectoris: Secondary | ICD-10-CM

## 2017-07-09 DIAGNOSIS — J449 Chronic obstructive pulmonary disease, unspecified: Secondary | ICD-10-CM | POA: Diagnosis not present

## 2017-07-09 LAB — BASIC METABOLIC PANEL
BUN: 14 mg/dL (ref 6–23)
CHLORIDE: 101 meq/L (ref 96–112)
CO2: 31 mEq/L (ref 19–32)
CREATININE: 1.06 mg/dL (ref 0.40–1.50)
Calcium: 8.9 mg/dL (ref 8.4–10.5)
GFR: 70.88 mL/min (ref >60.00)
Glucose, Bld: 119 mg/dL — ABNORMAL HIGH (ref 70–99)
POTASSIUM: 3.6 meq/L (ref 3.5–5.1)
SODIUM: 138 meq/L (ref 135–145)

## 2017-07-09 LAB — BRAIN NATRIURETIC PEPTIDE: PRO B NATRI PEPTIDE: 165 pg/mL — AB (ref 0.0–100.0)

## 2017-07-09 MED ORDER — FUROSEMIDE 40 MG PO TABS: 40.0000 mg | ORAL_TABLET | Freq: Every day | ORAL | 5 refills | Status: DC | PRN

## 2017-07-09 NOTE — Telephone Encounter (Signed)
Please call and schedule pt for an appt. Thank you!

## 2017-07-09 NOTE — Telephone Encounter (Signed)
nevermind

## 2017-07-09 NOTE — Assessment & Plan Note (Signed)
No CP On Coreg

## 2017-07-09 NOTE — Assessment & Plan Note (Signed)
8/18 Furosemide Labs

## 2017-07-09 NOTE — Assessment & Plan Note (Signed)
Labs Lasix

## 2017-07-09 NOTE — Progress Notes (Signed)
Subjective:  Patient ID: Jon Baker, male    DOB: [birth year]  Age: 83 y.o. MRN: [id number]  CC: No chief complaint on file.   HPI Jon Baker presents for leg swelling - worse x 2 weeks. He has to sleep in the recliner x 2 weeks F/u COPD Eating tomatoes w/salt  Outpatient Medications Prior to Visit  Medication Sig Dispense Refill  . albuterol (PROVENTIL HFA;VENTOLIN HFA) 108 (90 Base) MCG/ACT inhaler Inhale 2 puffs into the lungs every 6 (six) hours as needed for wheezing or shortness of breath. 1 Inhaler 5  . aspirin EC 81 MG tablet Take 1 tablet (81 mg total) by mouth daily. 100 tablet 3  . atorvastatin (LIPITOR) 80 MG tablet TAKE 1 TABLET (80 MG TOTAL) BY MOUTH DAILY AT 6 PM. 30 tablet 11  . carvedilol (COREG) 25 MG tablet TAKE 1 TABLET (25 MG TOTAL) BY MOUTH 2 (TWO) TIMES DAILY WITH A MEAL. 60 tablet 11  . cholecalciferol (VITAMIN D) 1000 UNITS tablet Take 1,000 Units by mouth daily.    . clopidogrel (PLAVIX) 75 MG tablet TAKE 1 TABLET BY MOUTH DAILY. 06/09/2015 TAKE 4 TABLETS BY MOUTH FOR FIRST DOSE ONLY. 90 tablet 2  . hydrochlorothiazide (MICROZIDE) 12.5 MG capsule TAKE 1 CAPSULE (12.5 MG TOTAL) BY MOUTH DAILY. 30 capsule 0  . ipratropium-albuterol (DUONEB) 0.5-2.5 (3) MG/3ML SOLN Take 3 mLs by nebulization every 6 (six) hours as needed. 360 mL 3  . losartan (COZAAR) 100 MG tablet Take 1 tablet (100 mg total) by mouth daily. 30 tablet 11  . nitroGLYCERIN (NITROSTAT) 0.4 MG SL tablet Place 1 tablet (0.4 mg total) under the tongue every 5 (five) minutes as needed for chest pain. 25 tablet 3  . pantoprazole (PROTONIX) 40 MG tablet Take 1 tablet (40 mg total) by mouth daily. 30 tablet 11  . umeclidinium bromide (INCRUSE ELLIPTA) 62.5 MCG/INH AEPB Inhale 1 puff into the lungs daily. 2 each 0   Facility-Administered Medications Prior to Visit  Medication Dose Route Frequency Provider Last Rate Last Dose  . ipratropium-albuterol (DUONEB) 0.5-2.5 (3) MG/3ML nebulizer solution 3 mL   3 mL Nebulization Q6H Saguier, Edward, PA-C        ROS Review of Systems  Constitutional: Negative for appetite change, fatigue and unexpected weight change.  HENT: Positive for congestion. Negative for nosebleeds, sneezing, sore throat and trouble swallowing.   Eyes: Negative for itching and visual disturbance.  Respiratory: Positive for cough and shortness of breath.   Cardiovascular: Positive for leg swelling. Negative for chest pain and palpitations.  Gastrointestinal: Negative for abdominal distention, blood in stool, diarrhea and nausea.  Genitourinary: Negative for frequency and hematuria.  Musculoskeletal: Positive for arthralgias. Negative for back pain, gait problem, joint swelling and neck pain.  Skin: Negative for rash.  Neurological: Negative for dizziness, tremors, speech difficulty and weakness.  Psychiatric/Behavioral: Negative for agitation, dysphoric mood and sleep disturbance. The patient is not nervous/anxious.     Objective:  BP 124/82 (BP Location: Left Arm, Patient Position: Sitting, Cuff Size: Large)   Pulse (!) 51   Temp 98.4 F (36.9 C) (Oral)   Ht 5\' 10"  (1.778 m)   Wt 217 lb (98.4 kg)   SpO2 92%   BMI 31.14 kg/m   BP Readings from Last 3 Encounters:  07/09/17 124/82  05/09/17 120/72  04/03/17 124/82    Wt Readings from Last 3 Encounters:  07/09/17 217 lb (98.4 kg)  05/09/17 213 lb (96.6 kg)  04/03/17 215 lb (97.5 kg)  Physical Exam  Constitutional: He is oriented to person, place, and time. He appears well-developed. No distress.  NAD  HENT:  Mouth/Throat: Oropharynx is clear and moist.  Eyes: Pupils are equal, round, and reactive to light. Conjunctivae are normal.  Neck: Normal range of motion. No JVD present. No thyromegaly present.  Cardiovascular: Normal rate, regular rhythm, normal heart sounds and intact distal pulses.  Exam reveals no gallop and no friction rub.   No murmur heard. Pulmonary/Chest: Effort normal. No respiratory  distress. He has wheezes. He has no rales. He exhibits no tenderness.  Abdominal: Soft. Bowel sounds are normal. He exhibits no distension and no mass. There is no tenderness. There is no rebound and no guarding.  Musculoskeletal: Normal range of motion. He exhibits edema. He exhibits no tenderness.  Lymphadenopathy:    He has no cervical adenopathy.  Neurological: He is alert and oriented to person, place, and time. He has normal reflexes. No cranial nerve deficit. He exhibits normal muscle tone. He displays a negative Romberg sign. Coordination and gait normal.  Skin: Skin is warm and dry. No rash noted.  Psychiatric: He has a normal mood and affect. His behavior is normal. Judgment and thought content normal.    Lab Results  Component Value Date   WBC 9.6 05/23/2015   HGB 15.7 05/23/2015   HCT 44.2 05/23/2015   PLT 259 05/23/2015   GLUCOSE 94 07/20/2015   CHOL 125 07/20/2015   TRIG 136.0 07/20/2015   HDL 25.40 (L) 07/20/2015   LDLCALC 72 07/20/2015   ALT 32 08/01/2015   AST 27 08/01/2015   NA 139 07/20/2015   K 4.5 07/20/2015   CL 105 07/20/2015   CREATININE 1.03 07/20/2015   BUN 20 07/20/2015   CO2 20 07/20/2015   TSH 2.04 03/31/2015   PSA 4.51 (H) 03/31/2015   INR 1.10 05/20/2015   HGBA1C 5.6 05/22/2015    Ct Chest High Resolution  Result Date: 04/11/2017 CLINICAL DATA:  Asbestos exposure.  Restrictive lung disease.  COPD. EXAM: CT CHEST WITHOUT CONTRAST TECHNIQUE: Multidetector CT imaging of the chest was performed following the standard protocol without intravenous contrast. High resolution imaging of the lungs, as well as inspiratory and expiratory imaging, was performed. COMPARISON:  01/20/2017 chest radiograph. FINDINGS: Cardiovascular: Normal heart size. No significant pericardial fluid/thickening. Left main, left anterior descending, left circumflex and right coronary atherosclerosis. Atherosclerotic thoracic aorta with ectatic 4.3 cm ascending thoracic aorta. Normal  caliber pulmonary arteries. Mediastinum/Nodes: Dominant hypodense 1.7 cm left thyroid lobe nodule. Unremarkable esophagus. No pathologically enlarged axillary, mediastinal or gross hilar lymph nodes, noting limited sensitivity for the detection of hilar adenopathy on this noncontrast study. Lungs/Pleura: No pneumothorax. No evidence of calcified pleural plaques. No pleural effusion. Moderate centrilobular emphysema with diffuse bronchial wall thickening. No acute consolidative airspace disease, lung masses or significant pulmonary nodules. There is basilar predominant subpleural reticulation and ground-glass attenuation in both lungs with associated mild traction bronchiolectasis. No frank honeycombing. No significant lobular air trapping on the expiration sequence. Upper abdomen: Cholecystectomy. Musculoskeletal: No aggressive appearing focal osseous lesions. Moderate thoracic spondylosis. Nonspecific T5 vertebral sclerotic lesion, probably a benign bone island. IMPRESSION: 1. No calcified pleural plaques or pleural effusions. 2. 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 Follow-up high-resolution chest CT in 12 months recommended to assess temporal pattern stability. 3. Aortic atherosclerosis.  Ectatic 4.3 cm ascending thoracic aorta. Recommend annual imaging followup by CTA or MRA. This recommendation follows 2010 ACCF/AHA/AATS/ACR/ASA/SCA/SCAI/SIR/STS/SVM Guidelines for the Diagnosis and Management of Patients with Thoracic Aortic Disease. Circulation. 2010; 121: Z610-R604. 4. Left main and 3 vessel coronary atherosclerosis. Electronically Signed   By: Delbert Phenix M.D.   On: 04/11/2017 12:57    Assessment & Plan:   There are no diagnoses linked to this encounter. I am having Mr. Harkin maintain  his aspirin EC, cholecalciferol, losartan, albuterol, pantoprazole, nitroGLYCERIN, carvedilol, ipratropium-albuterol, hydrochlorothiazide, umeclidinium bromide, clopidogrel, and atorvastatin. We will continue to administer ipratropium-albuterol.  No orders of the defined types were placed in this encounter.    Follow-up: No Follow-up on file.  Sonda Primes, MD

## 2017-07-09 NOTE — Assessment & Plan Note (Signed)
Worse: use Duoneb qid

## 2017-08-06 ENCOUNTER — Ambulatory Visit (INDEPENDENT_AMBULATORY_CARE_PROVIDER_SITE_OTHER): Payer: Medicare Other | Admitting: Internal Medicine

## 2017-08-06 ENCOUNTER — Encounter: Payer: Self-pay | Admitting: Internal Medicine

## 2017-08-06 DIAGNOSIS — I2510 Atherosclerotic heart disease of native coronary artery without angina pectoris: Secondary | ICD-10-CM | POA: Diagnosis not present

## 2017-08-06 DIAGNOSIS — R0902 Hypoxemia: Secondary | ICD-10-CM

## 2017-08-06 DIAGNOSIS — J449 Chronic obstructive pulmonary disease, unspecified: Secondary | ICD-10-CM | POA: Diagnosis not present

## 2017-08-06 DIAGNOSIS — R600 Localized edema: Secondary | ICD-10-CM

## 2017-08-06 DIAGNOSIS — E782 Mixed hyperlipidemia: Secondary | ICD-10-CM | POA: Diagnosis not present

## 2017-08-06 MED ORDER — METHYLPREDNISOLONE ACETATE 80 MG/ML IJ SUSP
80.0000 mg | Freq: Once | INTRAMUSCULAR | Status: AC
  Administered 2017-08-06: 80 mg via INTRAMUSCULAR

## 2017-08-06 NOTE — Assessment & Plan Note (Signed)
Furosemide prn 

## 2017-08-06 NOTE — Assessment & Plan Note (Signed)
04/03/17:  Walked 275 meters / Baseline Sat 96% on RA / Nadir Sat 88% @ 4:24 (required 2 L/m with exertion & patient did have wheezing)  O2 Steroids discussed - pt declined

## 2017-08-06 NOTE — Assessment & Plan Note (Signed)
Lipitor 

## 2017-08-06 NOTE — Assessment & Plan Note (Addendum)
F/u w/Dr Jamison Neighbor  Cont w/Rx Pt declined po steroids Depomedrol 80 mg IM

## 2017-08-06 NOTE — Patient Instructions (Signed)
   Milk free trial (no milk, ice cream, cheese and yogurt) for 4-6 weeks. OK to use almond, coconut, rice or soy milk. "Almond breeze" brand tastes good.  

## 2017-08-06 NOTE — Addendum Note (Signed)
Addended by: Scarlett Presto on: 08/06/2017 03:55 PM   Modules accepted: Orders

## 2017-08-06 NOTE — Assessment & Plan Note (Signed)
Losartan, Lipitor, Plavix, Toprol, ASA [facility]

## 2017-08-06 NOTE — Progress Notes (Signed)
Subjective:  Patient ID: Jon Baker, male    DOB: 1932-01-21  Age: 82 y.o. MRN: [id number]  CC: No chief complaint on file.   HPI Jon Baker presents for COPD, CAD, dyslipidemia. C/o DOE all the time. C/o phlegm in throat. The pt cancelled his appt w/Dr Jamison Neighbor. He was supposed to have a 6 min walk test w/hypoxemia...  04/03/17:  Walked 275 meters / Baseline Sat 96% on RA / Nadir Sat 88% @ 4:24 (required 2 L/m with exertion & patient did have wheezing)  Outpatient Medications Prior to Visit  Medication Sig Dispense Refill  . albuterol (PROVENTIL HFA;VENTOLIN HFA) 108 (90 Base) MCG/ACT inhaler Inhale 2 puffs into the lungs every 6 (six) hours as needed for wheezing or shortness of breath. 1 Inhaler 5  . aspirin EC 81 MG tablet Take 1 tablet (81 mg total) by mouth daily. 100 tablet 3  . atorvastatin (LIPITOR) 80 MG tablet TAKE 1 TABLET (80 MG TOTAL) BY MOUTH DAILY AT 6 PM. 30 tablet 11  . carvedilol (COREG) 25 MG tablet TAKE 1 TABLET (25 MG TOTAL) BY MOUTH 2 (TWO) TIMES DAILY WITH A MEAL. 60 tablet 11  . cholecalciferol (VITAMIN D) 1000 UNITS tablet Take 1,000 Units by mouth daily.    . clopidogrel (PLAVIX) 75 MG tablet TAKE 1 TABLET BY MOUTH DAILY. 06/09/2015 TAKE 4 TABLETS BY MOUTH FOR FIRST DOSE ONLY. 90 tablet 2  . furosemide (LASIX) 40 MG tablet Take 1 tablet (40 mg total) by mouth daily as needed. 30 tablet 5  . hydrochlorothiazide (MICROZIDE) 12.5 MG capsule TAKE 1 CAPSULE (12.5 MG TOTAL) BY MOUTH DAILY. 30 capsule 0  . ipratropium-albuterol (DUONEB) 0.5-2.5 (3) MG/3ML SOLN Take 3 mLs by nebulization every 6 (six) hours as needed. 360 mL 3  . losartan (COZAAR) 100 MG tablet Take 1 tablet (100 mg total) by mouth daily. 30 tablet 11  . nitroGLYCERIN (NITROSTAT) 0.4 MG SL tablet Place 1 tablet (0.4 mg total) under the tongue every 5 (five) minutes as needed for chest pain. 25 tablet 3  . pantoprazole (PROTONIX) 40 MG tablet Take 1 tablet (40 mg total) by mouth daily. 30 tablet 11    . umeclidinium bromide (INCRUSE ELLIPTA) 62.5 MCG/INH AEPB Inhale 1 puff into the lungs daily. 2 each 0   Facility-Administered Medications Prior to Visit  Medication Dose Route Frequency Provider Last Rate Last Dose  . ipratropium-albuterol (DUONEB) 0.5-2.5 (3) MG/3ML nebulizer solution 3 mL  3 mL Nebulization Q6H Saguier, Edward, PA-C        ROS Review of Systems  Constitutional: Positive for fatigue. Negative for appetite change and unexpected weight change.  HENT: Negative for congestion, nosebleeds, sneezing, sore throat and trouble swallowing.   Eyes: Negative for itching and visual disturbance.  Respiratory: Positive for shortness of breath and wheezing. Negative for cough.   Cardiovascular: Negative for chest pain, palpitations and leg swelling.  Gastrointestinal: Negative for abdominal distention, blood in stool, diarrhea and nausea.  Genitourinary: Negative for frequency and hematuria.  Musculoskeletal: Positive for arthralgias. Negative for back pain, gait problem, joint swelling and neck pain.  Skin: Negative for rash.  Neurological: Positive for headaches. Negative for dizziness, tremors, speech difficulty and weakness.  Psychiatric/Behavioral: Positive for decreased concentration. Negative for agitation, dysphoric mood and sleep disturbance. The patient is not nervous/anxious.     Objective:  BP (!) 142/90 (BP Location: Left Arm, Patient Position: Sitting, Cuff Size: Large)   Pulse 60   Temp 98.2 F (36.8 C) (Oral)  Ht 5\' 10"  (1.778 m)   Wt 214 lb (97.1 kg)   SpO2 95%   BMI 30.71 kg/m   BP Readings from Last 3 Encounters:  08/06/17 (!) 142/90  07/09/17 124/82  05/09/17 120/72    Wt Readings from Last 3 Encounters:  08/06/17 214 lb (97.1 kg)  07/09/17 217 lb (98.4 kg)  05/09/17 213 lb (96.6 kg)    Physical Exam  Constitutional: He is oriented to person, place, and time. He appears well-developed. No distress.  NAD  HENT:  Mouth/Throat: Oropharynx is  clear and moist.  Eyes: Pupils are equal, round, and reactive to light. Conjunctivae are normal.  Neck: Normal range of motion. No JVD present. No thyromegaly present.  Cardiovascular: Normal rate, regular rhythm, normal heart sounds and intact distal pulses.  Exam reveals no gallop and no friction rub.   No murmur heard. Pulmonary/Chest: Effort normal. No respiratory distress. He has wheezes. He has no rales. He exhibits no tenderness.  Abdominal: Soft. Bowel sounds are normal. He exhibits no distension and no mass. There is no tenderness. There is no rebound and no guarding.  Musculoskeletal: Normal range of motion. He exhibits no edema or tenderness.  Lymphadenopathy:    He has no cervical adenopathy.  Neurological: He is alert and oriented to person, place, and time. He has normal reflexes. No cranial nerve deficit. He exhibits normal muscle tone. He displays a negative Romberg sign. Coordination abnormal. Gait normal.  Skin: Skin is warm and dry. No rash noted.  Psychiatric: He has a normal mood and affect. His behavior is normal. Judgment and thought content normal.  ataxic SOB at rest Trace edema B  Lab Results  Component Value Date   WBC 9.6 05/23/2015   HGB 15.7 05/23/2015   HCT 44.2 05/23/2015   PLT 259 05/23/2015   GLUCOSE 119 (H) 07/09/2017   CHOL 125 07/20/2015   TRIG 136.0 07/20/2015   HDL 25.40 (L) 07/20/2015   LDLCALC 72 07/20/2015   ALT 32 08/01/2015   AST 27 08/01/2015   NA 138 07/09/2017   K 3.6 07/09/2017   CL 101 07/09/2017   CREATININE 1.06 07/09/2017   BUN 14 07/09/2017   CO2 31 07/09/2017   TSH 2.04 03/31/2015   PSA 4.51 (H) 03/31/2015   INR 1.10 05/20/2015   HGBA1C 5.6 05/22/2015    Ct Chest High Resolution  Result Date: 04/11/2017 CLINICAL DATA:  Asbestos exposure.  Restrictive lung disease.  COPD. EXAM: CT CHEST WITHOUT CONTRAST TECHNIQUE: Multidetector CT imaging of the chest was performed following the standard protocol without intravenous  contrast. High resolution imaging of the lungs, as well as inspiratory and expiratory imaging, was performed. COMPARISON:  01/20/2017 chest radiograph. FINDINGS: Cardiovascular: Normal heart size. No significant pericardial fluid/thickening. Left main, left anterior descending, left circumflex and right coronary atherosclerosis. Atherosclerotic thoracic aorta with ectatic 4.3 cm ascending thoracic aorta. Normal caliber pulmonary arteries. Mediastinum/Nodes: Dominant hypodense 1.7 cm left thyroid lobe nodule. Unremarkable esophagus. No pathologically enlarged axillary, mediastinal or gross hilar lymph nodes, noting limited sensitivity for the detection of hilar adenopathy on this noncontrast study. Lungs/Pleura: No pneumothorax. No evidence of calcified pleural plaques. No pleural effusion. Moderate centrilobular emphysema with diffuse bronchial wall thickening. No acute consolidative airspace disease, lung masses or significant pulmonary nodules. There is basilar predominant subpleural reticulation and ground-glass attenuation in both lungs with associated mild traction bronchiolectasis. No frank honeycombing. No significant lobular air trapping on the expiration sequence. Upper abdomen: Cholecystectomy. Musculoskeletal: No aggressive appearing focal  osseous lesions. Moderate thoracic spondylosis. Nonspecific T5 vertebral sclerotic lesion, probably a benign bone island. IMPRESSION: 1. No calcified pleural plaques or pleural effusions. 2. 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 Follow-up high-resolution chest CT in 12 months recommended to assess temporal pattern stability. 3. Aortic atherosclerosis. Ectatic 4.3 cm ascending thoracic aorta. Recommend annual imaging followup by CTA or MRA. This  recommendation follows 2010 ACCF/AHA/AATS/ACR/ASA/SCA/SCAI/SIR/STS/SVM Guidelines for the Diagnosis and Management of Patients with Thoracic Aortic Disease. Circulation. 2010; 121: Z610-R604. 4. Left main and 3 vessel coronary atherosclerosis. Electronically Signed   By: Delbert Phenix M.D.   On: 04/11/2017 12:57    Assessment & Plan:   There are no diagnoses linked to this encounter. I am having Mr. Vonderhaar maintain his aspirin EC, cholecalciferol, losartan, albuterol, pantoprazole, nitroGLYCERIN, carvedilol, ipratropium-albuterol, hydrochlorothiazide, umeclidinium bromide, clopidogrel, atorvastatin, and furosemide. We will continue to administer ipratropium-albuterol.  No orders of the defined types were placed in this encounter.    Follow-up: No Follow-up on file.  Sonda Primes, MD

## 2017-08-18 ENCOUNTER — Encounter: Payer: Self-pay | Admitting: Pulmonary Disease

## 2017-08-18 DIAGNOSIS — J449 Chronic obstructive pulmonary disease, unspecified: Secondary | ICD-10-CM | POA: Diagnosis not present

## 2017-08-21 ENCOUNTER — Encounter (INDEPENDENT_AMBULATORY_CARE_PROVIDER_SITE_OTHER): Payer: Medicare Other | Admitting: Ophthalmology

## 2017-08-21 DIAGNOSIS — H43813 Vitreous degeneration, bilateral: Secondary | ICD-10-CM

## 2017-08-21 DIAGNOSIS — H34831 Tributary (branch) retinal vein occlusion, right eye, with macular edema: Secondary | ICD-10-CM | POA: Diagnosis not present

## 2017-08-21 DIAGNOSIS — H353121 Nonexudative age-related macular degeneration, left eye, early dry stage: Secondary | ICD-10-CM

## 2017-08-21 DIAGNOSIS — H35033 Hypertensive retinopathy, bilateral: Secondary | ICD-10-CM | POA: Diagnosis not present

## 2017-08-21 DIAGNOSIS — I10 Essential (primary) hypertension: Secondary | ICD-10-CM | POA: Diagnosis not present

## 2017-08-21 DIAGNOSIS — J449 Chronic obstructive pulmonary disease, unspecified: Secondary | ICD-10-CM | POA: Diagnosis not present

## 2017-08-26 ENCOUNTER — Telehealth: Payer: Self-pay | Admitting: Pulmonary Disease

## 2017-08-26 NOTE — Telephone Encounter (Signed)
OVERNIGHT OXIMETRY 08/18/17:  Performed on room air. Duration 9 hours 44 minutes 21 seconds. Lowest pulse 43 bpm. Lowest saturation 77%. 267.1 minutes with saturation </= 88%.   Please let the patient know that his overnight oxygen test today shows significant hypoxia. Given the abnormalities I do question whether or not he may have  some sleep disordered breathing. as such, I would recommend ordering a sleep study as long as the patient is in agreement. He was supposed to have follow-up with me 3 months from his last appointment on 04/03/17. Please schedule a follow-up appointment for this patient as soon as possible. If he does not wish to undergo sleep testing let me know because we may need to order nocturnal oxygen therapy. Please review my last note as he was also to be scheduled for a 6 minute walk test on room air with possible oxygen titration as well as spirometry with DLCO on or before his appointment. Thank you.

## 2017-08-27 NOTE — Telephone Encounter (Addendum)
Spoke with pt. He is aware his ONO results. He wants to speak to [facility] before making any decision about a sleep study. ROV has been scheduled for 08/28/17. Pt also declined to making an appointment for a or PFT at this time. Will route message to JN to make him aware.

## 2017-08-28 ENCOUNTER — Encounter: Payer: Self-pay | Admitting: Pulmonary Disease

## 2017-08-28 ENCOUNTER — Ambulatory Visit (INDEPENDENT_AMBULATORY_CARE_PROVIDER_SITE_OTHER): Payer: Medicare Other | Admitting: Pulmonary Disease

## 2017-08-28 VITALS — BP 130/70 | HR 54 | Ht 71.5 in | Wt 209.5 lb

## 2017-08-28 DIAGNOSIS — J449 Chronic obstructive pulmonary disease, unspecified: Secondary | ICD-10-CM

## 2017-08-28 DIAGNOSIS — I2510 Atherosclerotic heart disease of native coronary artery without angina pectoris: Secondary | ICD-10-CM | POA: Diagnosis not present

## 2017-08-28 DIAGNOSIS — J849 Interstitial pulmonary disease, unspecified: Secondary | ICD-10-CM | POA: Diagnosis not present

## 2017-08-28 DIAGNOSIS — G4734 Idiopathic sleep related nonobstructive alveolar hypoventilation: Secondary | ICD-10-CM | POA: Diagnosis not present

## 2017-08-28 NOTE — Patient Instructions (Addendum)
   Use the Incruse inhaler we are giving you today by doing 1 inhalation once daily starting tomorrow. Use it at the same time every day.  Call our office for a prescription if this seems to help your breathing.  Use your Duoneb or Ventolin inhaler as needed.  Call us if you have any questions or concerns before your next appointment.  TESTS ORDERED: 1. on room air at next appointment

## 2017-08-28 NOTE — Addendum Note (Signed)
Addended by: Sheran Luz on: 08/28/2017 01:15 PM   Modules accepted: Orders

## 2017-08-28 NOTE — Progress Notes (Signed)
Subjective:    Patient ID: Jon Baker, male    DOB: 1932-06-16, 83 y.o.   MRN: [id number]  C.C.:  Follow-up for Severe COPD, ILD, Nocturnal Hypoxia, & H/O Asbestos Exposure.  HPI Severe COPD: Previously on Anoro and Duonebs. Patient also previously using an Atrovent inhaler. No significant symptomatic relief from these inhaled medications. Patient's insurance would cover Incruse. Samples given to patient after last appointment.  He reports he is using his Duoneb 3-4 times daily. He is unsure if he ever tried using the Incruse inhaler. He reports he does have coughing, wheezing & dyspnea at times.   ILD: Predominantly UIP with basilar predominant pattern. Possibly secondary to previous asbestos exposure. Patient reports he has had "scar tissue" for "years". He believes he may have been told that around 1995.   Nocturnal hypoxia: Seen on overnight oximetry over this month. Question possible sleep-disordered breathing. Patient does snore but has had no witnessed apneas. Patient reports that during his test he had trouble falling asleep and was sleeping in his recliner at the time.   History of asbestos exposure: Patient with no evidence of pleural involvement. Does have ILD on high-resolution CT imaging.   Review of Systems Patient reports he has continued trouble seeing that is worsening. He has trouble seeing especially out of his right eye. He reports he feels "dizzy" and has trouble walking at times. He reports he does have "occasoinal" chest pain that he he is unable to further characterize. He reports he does have intermittent headaches. He describes a "shooting" pain in his head at times. He denies any history of glaucoma. He reports he does get up to urinate frequently at night.   No Known Allergies  Current Outpatient Prescriptions on File Prior to Visit  Medication Sig Dispense Refill  . albuterol (PROVENTIL HFA;VENTOLIN HFA) 108 (90 Base) MCG/ACT inhaler Inhale 2 puffs into the  lungs every 6 (six) hours as needed for wheezing or shortness of breath. 1 Inhaler 5  . aspirin EC 81 MG tablet Take 1 tablet (81 mg total) by mouth daily. 100 tablet 3  . atorvastatin (LIPITOR) 80 MG tablet TAKE 1 TABLET (80 MG TOTAL) BY MOUTH DAILY AT 6 PM. 30 tablet 11  . carvedilol (COREG) 25 MG tablet TAKE 1 TABLET (25 MG TOTAL) BY MOUTH 2 (TWO) TIMES DAILY WITH A MEAL. 60 tablet 11  . cholecalciferol (VITAMIN D) 1000 UNITS tablet Take 1,000 Units by mouth daily.    . clopidogrel (PLAVIX) 75 MG tablet TAKE 1 TABLET BY MOUTH DAILY. 06/09/2015 TAKE 4 TABLETS BY MOUTH FOR FIRST DOSE ONLY. 90 tablet 2  . furosemide (LASIX) 40 MG tablet Take 1 tablet (40 mg total) by mouth daily as needed. 30 tablet 5  . hydrochlorothiazide (MICROZIDE) 12.5 MG capsule TAKE 1 CAPSULE (12.5 MG TOTAL) BY MOUTH DAILY. 30 capsule 0  . ipratropium-albuterol (DUONEB) 0.5-2.5 (3) MG/3ML SOLN Take 3 mLs by nebulization every 6 (six) hours as needed. 360 mL 3  . losartan (COZAAR) 100 MG tablet Take 1 tablet (100 mg total) by mouth daily. 30 tablet 11  . nitroGLYCERIN (NITROSTAT) 0.4 MG SL tablet Place 1 tablet (0.4 mg total) under the tongue every 5 (five) minutes as needed for chest pain. 25 tablet 3  . umeclidinium bromide (INCRUSE ELLIPTA) 62.5 MCG/INH AEPB Inhale 1 puff into the lungs daily. 2 each 0   Current Facility-Administered Medications on File Prior to Visit  Medication Dose Route Frequency Provider Last Rate Last Dose  . ipratropium-albuterol (  DUONEB) 0.5-2.5 (3) MG/3ML nebulizer solution 3 mL  3 mL Nebulization Q6H Saguier, Kateri Mc        Past Medical History:  Diagnosis Date  . Aortic stenosis    a. Mild by echo 08/2015.   [name] CAD (coronary artery disease)    a. STEMI 05/2015 s/p DES to LAD.  [name] Claudication [facility])    a. 2009, saw  cardiology, declined ABIs.  [name] COPD (chronic obstructive pulmonary disease) (HCC)    states he has been told by MD he has copd  . Depression   . DJD (degenerative joint  disease)   . Dyslipidemia 2016  . HTN (hypertension)   . Ischemic cardiomyopathy    a. 05/2015: EF 35% at cath, 35-40% by echo. b. EF improved to 55-60% by echo 08/2015.  [name] Pancreatitis, gallstone 05/2013  . RBBB   . STEMI (ST elevation myocardial infarction) (HCC) 05/20/15   3.0 x 28 mm Promus premier DES to the LAD    Past Surgical History:  Procedure Laterality Date  . CARDIAC CATHETERIZATION N/A 05/20/2015   Procedure: Left Heart Cath and Coronary Angiography;  Surgeon: [name], MD; LAD 99%, circumflex 50%, RCA 40%, EF 35% with akinesis in the mid anterior wall apex and inferior apex   . CARDIAC CATHETERIZATION N/A 05/20/2015   Procedure: Coronary Stent Intervention;  Surgeon: [name], MD;  3.0 x 28 mm Promus premier DES to the LAD  . CHOLECYSTECTOMY N/A 06/06/2013   Procedure: LAPAROSCOPIC CHOLECYSTECTOMY WITH INTRAOPERATIVE CHOLANGIOGRAM;  Surgeon: Liz Malady, MD;  Location: [facility] OR;  Service: General;  Laterality: N/A;  . Glanglian cyst removal Left 1970's  . myleogram  1979  . TOTAL KNEE ARTHROPLASTY Right 02-2011   Dr Despina Hick    Family History  Problem Relation Age of Onset  . Pancreatic cancer Mother   . Coronary artery disease Mother   . Coronary artery disease Father        several MI'S  . Breast cancer Sister   . Diabetes Neg Hx   . Lung disease Neg Hx     Social History   Social History  . Marital status: Married    Spouse name: N/A  . Number of children: N/A  . Years of education: N/A   Social History Main Topics  . Smoking status: Former Smoker    Packs/day: 2.00    Years: 49.00    Types: Cigarettes    Start date: 04/26/1939    Quit date: 12/10/1987  . Smokeless tobacco: Never Used  . Alcohol use No  . Drug use: No  . Sexual activity: Not Asked   Other Topics Concern  . None   Social History Narrative   [facility] (02/12/17):   Originally from [facility]. 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 Does have asbestos exposure through his work. No pets currently. No bird or mold exposure. Doesn't have any hobbies currently. Previously enjoyed square dancing.       Objective:   Physical Exam  BP 130/70 (BP Location: Left Arm, Cuff Size: Normal)   Pulse (!) 54   Ht 5' 11.5" (1.816 m)   Wt 209 lb 8 oz (95 kg)   SpO2 90%   BMI 28.81 kg/m   General:  Awake. Obese male. No distress. Integument:  Warm & dry. No rash on exposed skin. No bruising on exposed skin. Extremities:  No cyanosis  or clubbing.  HEENT:  No scleral icterus. No nasal turbinate swelling. Moist mucous membranes Cardiovascular:  Slightly bradycardic. No edema. Unable to appreciate JVD.  Pulmonary:  Slightly diminished breath sounds bilaterally. Otherwise clear with auscultation. Normal work of breathing. Abdomen: Soft. Normal bowel sounds. Protuberant. Musculoskeletal:  Normal bulk and tone. No joint effusion appreciated.  PFT 04/03/17: FVC 1.92 L (49%) FEV1 0.97 L (35%) FEV1/FVC 0.51 FEF 25-75 0.34 L (18%) positive bronchodilator response TLC 4.95 L (70%) RV 115% ERV 112% DLCO corrected 49% 01/28/13: FVC 3.50 L (81%) FEV1 1.94 L (70%) FEV1/FVC 0.55 FEF 25-75 0.71 L (30%) negative bronchodilator response TLC 6.14 L (93%) RV 96% ERV 47% DLCO uncorrected 87%  04/03/17:  Walked 275 meters / Baseline Sat 96% on RA / Nadir Sat 88% @ 4:24 (required 2 L/m with exertion & patient did have wheezing)  OVERNIGHT OXIMETRY 08/18/17:  Performed on room air. Duration 9 hours 44 minutes 21 seconds. Lowest pulse 43 bpm. Lowest saturation 77%. 267.1 minutes with saturation </= 88%.   IMAGING HRCT CHEST W/O 04/11/17 (personally reviewed by me): Basilar predominant subpleural reticulation with craniocaudal progression consistent with an early UIP pattern. No groundglass opacities, honeycomb changes, or traction bronchiectasis. No parenchymal mass or opacity appreciated otherwise. No  pleural effusion or pleural plaque formation. No pathologic mediastinal adenopathy. No pericardial effusion.  CXR PA/LAT 01/20/17 (previously reviewed by me):  Hyperinflation suggested with flattening of the diaphragms. No parenchymal mass or opacity appreciated. No pleural effusion. Heart normal in size & mediastinum normal in contour.  CARDIAC TTE (09/06/15): LV normal in size with EF 55-60%. No regional wall motion abnormalities & grade 1 diastolic dysfunction. LA & RA normal in size. RV normal in size and function. Mild aortic stenosis without regurgitation. Aortic root normal in size. No mitral stenosis or regurgitation. No pulmonic stenosis or regurgitation. Trivial tricuspid regurgitation. No pericardial effusion.  LABS 02/12/17 Alpha-1 antitrypsin: MZ (89)    Assessment & Plan:  83 y.o. male with previous history of asbestos exposure as well as tobacco use. Reviewing patient's high-resolution CT scan does show a very mild interstitial lung disease with basilar predominant that is likely early UIP. Given his history of asbestos this is the most likely cause. We reviewed his overnight oximetry which did show significant desaturation as well as his previous 6 minute walk test which showed significant desaturation. Patient has been hesitant to initiate oxygen therapy. He does have difficulty sleeping but no other symptoms that would suggest sleep apnea. We reviewed his inhaler and nebulizer regimen today. I instructed the patient to contact me if he had any new breathing problems or questions before his next appointment.  1. Severe COPD: Initiating Incruse. Patient given sample today and instructed on technique. Patient instructed to contact our office for a prescription if this was more effective than his nebulizer regimen. 2. ILD: Possibly secondary to asbestos. Deferring further workup. 3. History of asbestos exposure: Possible parenchymal involvement. 4. Health maintenance: Previously declined  immunizations. 5. Follow-up: Return to clinic in 6 weeks or sooner if needed.  Donna Christen Jamison Neighbor, M.D. [facility] Pager:  [phone] After 3pm or if no response, call [phone] 12:57 PM 08/28/17

## 2017-09-10 ENCOUNTER — Ambulatory Visit: Payer: Medicare Other | Admitting: Internal Medicine

## 2017-10-09 ENCOUNTER — Ambulatory Visit: Payer: Medicare Other

## 2017-10-09 ENCOUNTER — Ambulatory Visit: Payer: Medicare Other | Admitting: Adult Health

## 2017-10-23 ENCOUNTER — Other Ambulatory Visit: Payer: Self-pay | Admitting: Internal Medicine

## 2017-11-11 ENCOUNTER — Ambulatory Visit (INDEPENDENT_AMBULATORY_CARE_PROVIDER_SITE_OTHER): Payer: Medicare Other | Admitting: Internal Medicine

## 2017-11-11 ENCOUNTER — Encounter: Payer: Self-pay | Admitting: Internal Medicine

## 2017-11-11 VITALS — BP 154/88 | HR 54 | Temp 98.3°F | Ht 71.5 in | Wt 215.0 lb

## 2017-11-11 DIAGNOSIS — R600 Localized edema: Secondary | ICD-10-CM | POA: Diagnosis not present

## 2017-11-11 DIAGNOSIS — I509 Heart failure, unspecified: Secondary | ICD-10-CM | POA: Insufficient documentation

## 2017-11-11 DIAGNOSIS — J849 Interstitial pulmonary disease, unspecified: Secondary | ICD-10-CM

## 2017-11-11 DIAGNOSIS — I5042 Chronic combined systolic (congestive) and diastolic (congestive) heart failure: Secondary | ICD-10-CM | POA: Diagnosis not present

## 2017-11-11 DIAGNOSIS — G4734 Idiopathic sleep related nonobstructive alveolar hypoventilation: Secondary | ICD-10-CM

## 2017-11-11 DIAGNOSIS — R0600 Dyspnea, unspecified: Secondary | ICD-10-CM | POA: Diagnosis not present

## 2017-11-11 DIAGNOSIS — I2510 Atherosclerotic heart disease of native coronary artery without angina pectoris: Secondary | ICD-10-CM

## 2017-11-11 MED ORDER — FUROSEMIDE 40 MG PO TABS: 40.0000 mg | ORAL_TABLET | Freq: Every day | ORAL | 11 refills | Status: DC | PRN

## 2017-11-11 NOTE — Assessment & Plan Note (Signed)
Worse - CHF diastolic>systolic Re-start Lasix O2 

## 2017-11-11 NOTE — Addendum Note (Signed)
Addended by: Scarlett Presto on: 11/11/2017 03:23 PM   Modules accepted: Orders

## 2017-11-11 NOTE — Assessment & Plan Note (Signed)
Worse - CHF diastolic>systolic Re-start Lasix O2

## 2017-11-11 NOTE — Progress Notes (Signed)
Subjective:  Patient ID: Jon Baker, male    DOB: 11-11-1932  Age: 83 y.o. MRN: [id number]  CC: No chief complaint on file.   HPI Jon Baker presents for SOB, swelling, stomach enlargement. All is worse. [facility] did overnight oxymetry: O2 sats 77% on 08/18/17. Dr Jamison Neighbor advised him to use MDIs. Out of Furosemide..  Outpatient Medications Prior to Visit  Medication Sig Dispense Refill  . albuterol (PROVENTIL HFA;VENTOLIN HFA) 108 (90 Base) MCG/ACT inhaler Inhale 2 puffs into the lungs every 6 (six) hours as needed for wheezing or shortness of breath. 1 Inhaler 5  . aspirin EC 81 MG tablet Take 1 tablet (81 mg total) by mouth daily. 100 tablet 3  . atorvastatin (LIPITOR) 80 MG tablet TAKE 1 TABLET (80 MG TOTAL) BY MOUTH DAILY AT 6 PM. 30 tablet 11  . carvedilol (COREG) 25 MG tablet TAKE 1 TABLET (25 MG TOTAL) BY MOUTH 2 (TWO) TIMES DAILY WITH A MEAL. 60 tablet 11  . cholecalciferol (VITAMIN D) 1000 UNITS tablet Take 1,000 Units by mouth daily.    . clopidogrel (PLAVIX) 75 MG tablet TAKE 1 TABLET BY MOUTH DAILY. 06/09/2015 TAKE 4 TABLETS BY MOUTH FOR FIRST DOSE ONLY. 90 tablet 2  . hydrochlorothiazide (MICROZIDE) 12.5 MG capsule TAKE 1 CAPSULE (12.5 MG TOTAL) BY MOUTH DAILY. 30 capsule 0  . ipratropium-albuterol (DUONEB) 0.5-2.5 (3) MG/3ML SOLN Take 3 mLs by nebulization every 6 (six) hours as needed. 360 mL 3  . losartan (COZAAR) 100 MG tablet TAKE 1 TABLET BY MOUTH EVERY DAY 30 tablet 11  . nitroGLYCERIN (NITROSTAT) 0.4 MG SL tablet Place 1 tablet (0.4 mg total) under the tongue every 5 (five) minutes as needed for chest pain. 25 tablet 3  . umeclidinium bromide (INCRUSE ELLIPTA) 62.5 MCG/INH AEPB Inhale 1 puff into the lungs daily. 2 each 0  . furosemide (LASIX) 40 MG tablet Take 1 tablet (40 mg total) by mouth daily as needed. 30 tablet 5   Facility-Administered Medications Prior to Visit  Medication Dose Route Frequency Provider Last Rate Last Dose  . ipratropium-albuterol  (DUONEB) 0.5-2.5 (3) MG/3ML nebulizer solution 3 mL  3 mL Nebulization Q6H Saguier, Edward, PA-C        ROS Review of Systems  Constitutional: Positive for fatigue. Negative for appetite change and unexpected weight change.  HENT: Negative for congestion, nosebleeds, sneezing, sore throat and trouble swallowing.   Eyes: Negative for itching and visual disturbance.  Respiratory: Positive for shortness of breath. Negative for cough.   Cardiovascular: Positive for leg swelling. Negative for chest pain and palpitations.  Gastrointestinal: Positive for abdominal distention. Negative for blood in stool, diarrhea and nausea.  Genitourinary: Negative for frequency and hematuria.  Musculoskeletal: Negative for back pain, gait problem, joint swelling and neck pain.  Skin: Negative for rash.  Neurological: Negative for dizziness, tremors, speech difficulty and weakness.  Psychiatric/Behavioral: Negative for agitation, dysphoric mood and sleep disturbance. The patient is not nervous/anxious.     Objective:  BP (!) 154/88 (BP Location: Left Arm, Patient Position: Sitting, Cuff Size: Large)   Pulse (!) 54   Temp 98.3 F (36.8 C) (Oral)   Ht 5' 11.5" (1.816 m)   Wt 215 lb (97.5 kg)   SpO2 93%   BMI 29.57 kg/m   BP Readings from Last 3 Encounters:  11/11/17 (!) 154/88  08/28/17 130/70  08/06/17 (!) 142/90    Wt Readings from Last 3 Encounters:  11/11/17 215 lb (97.5 kg)  08/28/17 209 lb 8  oz (95 kg)  08/06/17 214 lb (97.1 kg)    Physical Exam  Constitutional: He is oriented to person, place, and time. He appears well-developed. No distress.  NAD  HENT:  Mouth/Throat: Oropharynx is clear and moist.  Eyes: Conjunctivae are normal. Pupils are equal, round, and reactive to light.  Neck: Normal range of motion. No JVD present. No thyromegaly present.  Cardiovascular: Normal rate, regular rhythm, normal heart sounds and intact distal pulses. Exam reveals no gallop and no friction rub.  No  murmur heard. Pulmonary/Chest: Effort normal. No respiratory distress. He has no wheezes. He has rales. He exhibits no tenderness.  Abdominal: Soft. Bowel sounds are normal. He exhibits no distension and no mass. There is no tenderness. There is no rebound and no guarding.  Musculoskeletal: Normal range of motion. He exhibits edema. He exhibits no tenderness.  Lymphadenopathy:    He has no cervical adenopathy.  Neurological: He is alert and oriented to person, place, and time. He has normal reflexes. No cranial nerve deficit. He exhibits normal muscle tone. He displays a negative Romberg sign. Coordination and gait normal.  Skin: Skin is warm and dry. No rash noted.  Psychiatric: He has a normal mood and affect. His behavior is normal. Judgment and thought content normal.  dyspneic LEs w/trace edema  Lab Results  Component Value Date   WBC 9.6 05/23/2015   HGB 15.7 05/23/2015   HCT 44.2 05/23/2015   PLT 259 05/23/2015   GLUCOSE 119 (H) 07/09/2017   CHOL 125 07/20/2015   TRIG 136.0 07/20/2015   HDL 25.40 (L) 07/20/2015   LDLCALC 72 07/20/2015   ALT 32 08/01/2015   AST 27 08/01/2015   NA 138 07/09/2017   K 3.6 07/09/2017   CL 101 07/09/2017   CREATININE 1.06 07/09/2017   BUN 14 07/09/2017   CO2 31 07/09/2017   TSH 2.04 03/31/2015   PSA 4.51 (H) 03/31/2015   INR 1.10 05/20/2015   HGBA1C 5.6 05/22/2015    Ct Chest High Resolution  Result Date: 04/11/2017 CLINICAL DATA:  Asbestos exposure.  Restrictive lung disease.  COPD. EXAM: CT CHEST WITHOUT CONTRAST TECHNIQUE: Multidetector CT imaging of the chest was performed following the standard protocol without intravenous contrast. High resolution imaging of the lungs, as well as inspiratory and expiratory imaging, was performed. COMPARISON:  01/20/2017 chest radiograph. FINDINGS: Cardiovascular: Normal heart size. No significant pericardial fluid/thickening. Left main, left anterior descending, left circumflex and right coronary  atherosclerosis. Atherosclerotic thoracic aorta with ectatic 4.3 cm ascending thoracic aorta. Normal caliber pulmonary arteries. Mediastinum/Nodes: Dominant hypodense 1.7 cm left thyroid lobe nodule. Unremarkable esophagus. No pathologically enlarged axillary, mediastinal or gross hilar lymph nodes, noting limited sensitivity for the detection of hilar adenopathy on this noncontrast study. Lungs/Pleura: No pneumothorax. No evidence of calcified pleural plaques. No pleural effusion. Moderate centrilobular emphysema with diffuse bronchial wall thickening. No acute consolidative airspace disease, lung masses or significant pulmonary nodules. There is basilar predominant subpleural reticulation and ground-glass attenuation in both lungs with associated mild traction bronchiolectasis. No frank honeycombing. No significant lobular air trapping on the expiration sequence. Upper abdomen: Cholecystectomy. Musculoskeletal: No aggressive appearing focal osseous lesions. Moderate thoracic spondylosis. Nonspecific T5 vertebral sclerotic lesion, probably a benign bone island. IMPRESSION: 1. No calcified pleural plaques or pleural effusions. 2. 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 Follow-up high-resolution  chest CT in 12 months recommended to assess temporal pattern stability. 3. Aortic atherosclerosis. Ectatic 4.3 cm ascending thoracic aorta. Recommend annual imaging followup by CTA or MRA. This recommendation follows 2010 ACCF/AHA/AATS/ACR/ASA/SCA/SCAI/SIR/STS/SVM Guidelines for the Diagnosis and Management of Patients with Thoracic Aortic Disease. Circulation. 2010; 121: [id number]. 4. Left main and 3 vessel coronary atherosclerosis. Electronically Signed   By: Delbert PhenixJason A Poff M.D.   On: 04/11/2017 12:57     Assessment & Plan:   There are no diagnoses linked to this encounter. I am having Pedro EarlsKelly Rail maintain his aspirin EC, cholecalciferol, albuterol, nitroGLYCERIN, carvedilol, ipratropium-albuterol, hydrochlorothiazide, umeclidinium bromide, clopidogrel, atorvastatin, losartan, and furosemide. We will continue to administer ipratropium-albuterol.  Meds ordered this encounter  Medications  . furosemide (LASIX) 40 MG tablet    Sig: Take 1 tablet (40 mg total) by mouth daily as needed.    Dispense:  30 tablet    Refill:  11     Follow-up: No Follow-up on file.  Sonda PrimesAlex Cash Duce, MD

## 2017-11-11 NOTE — Assessment & Plan Note (Signed)
[facility] did overnight oxymetry: O2 sats 77% on 08/18/17.

## 2017-11-11 NOTE — Assessment & Plan Note (Signed)
Start O2 at night [facility] did overnight oxymetry: O2 sats 77% on 08/18/17.

## 2017-11-20 ENCOUNTER — Encounter (INDEPENDENT_AMBULATORY_CARE_PROVIDER_SITE_OTHER): Payer: Medicare Other | Admitting: Ophthalmology

## 2017-11-20 ENCOUNTER — Telehealth: Payer: Self-pay | Admitting: Adult Health

## 2017-11-21 NOTE — Telephone Encounter (Signed)
Pt is seeing TP on 12/18 with a 11mw prior.  Will route to JJ to make aware that asap order is being requested on this date.

## 2017-11-25 ENCOUNTER — Ambulatory Visit (INDEPENDENT_AMBULATORY_CARE_PROVIDER_SITE_OTHER): Payer: Medicare Other | Admitting: *Deleted

## 2017-11-25 ENCOUNTER — Encounter: Payer: Self-pay | Admitting: Adult Health

## 2017-11-25 ENCOUNTER — Ambulatory Visit (INDEPENDENT_AMBULATORY_CARE_PROVIDER_SITE_OTHER): Payer: Medicare Other | Admitting: Adult Health

## 2017-11-25 VITALS — BP 134/60 | HR 60 | Ht 70.0 in | Wt 211.2 lb

## 2017-11-25 DIAGNOSIS — J9611 Chronic respiratory failure with hypoxia: Secondary | ICD-10-CM | POA: Insufficient documentation

## 2017-11-25 DIAGNOSIS — J849 Interstitial pulmonary disease, unspecified: Secondary | ICD-10-CM

## 2017-11-25 DIAGNOSIS — I2510 Atherosclerotic heart disease of native coronary artery without angina pectoris: Secondary | ICD-10-CM

## 2017-11-25 DIAGNOSIS — J449 Chronic obstructive pulmonary disease, unspecified: Secondary | ICD-10-CM

## 2017-11-25 MED ORDER — BUDESONIDE-FORMOTEROL FUMARATE 160-4.5 MCG/ACT IN AERO: 2.0000 | INHALATION_SPRAY | Freq: Two times a day (BID) | RESPIRATORY_TRACT | 0 refills | Status: DC

## 2017-11-25 NOTE — Telephone Encounter (Signed)
Pt is scheduled to see TP today at 1430 Noted, thank you - will place O2 order at today's visit Will sign off

## 2017-11-25 NOTE — Patient Instructions (Addendum)
Begin oxygen 3 L. Begin Synmbicort 2 puffs Twice daily  , rinse after use.  Use DuoNeb nebulizer 4 times daily. Mucinex DM twice daily as needed for cough and congestion Follow-up in 2 weeks Ricci Dirocco NP or Byrum MD and As needed   Please contact office for sooner follow up if symptoms do not improve or worsen or seek emergency care

## 2017-11-25 NOTE — Progress Notes (Signed)
@Patient  ID: Jon Baker, male    DOB: 11/03/1932, 83 y.o.   MRN: [id number]  Chief Complaint  Patient presents with  . Follow-up    Referring provider: [name], Georgina Quint, MD  HPI: 83 year old male former smoker followed for severe COPD (MZ Phenotype) , ILD, nocturnal hypoxemia, history of asbestosis exposure  TEST [name]  PFT 04/03/17: FVC 1.92 L (49%) FEV1 0.97 L (35%) FEV1/FVC 0.51 FEF 25-75 0.34 L (18%) positive bronchodilator response TLC 4.95 L (70%) RV 115% ERV 112% DLCO corrected 49% 01/28/13: FVC 3.50 L (81%) FEV1 1.94 L (70%) FEV1/FVC 0.55 FEF 25-75 0.71 L (30%) negative bronchodilator response TLC 6.14 L (93%) RV 96% ERV 47% DLCO uncorrected 87%  04/03/17: Walked 275 meters / Baseline Sat 96% on RA / Nadir Sat 88% @ 4:24 (required 2 L/m with exertion &patient did have wheezing)  OVERNIGHT OXIMETRY 08/18/17: Performed on room air. Duration 9 hours 44 minutes 21 seconds. Lowest pulse 43 bpm. Lowest saturation 77%. 267.1 minutes with saturation </= 88%.   IMAGING HRCT CHEST W/O 04/11/17 (personally reviewed by me): Basilar predominant subpleural reticulation with craniocaudal progression consistent with an early UIP pattern. No groundglass opacities, honeycomb changes, or traction bronchiectasis. No parenchymal mass or opacity appreciated otherwise. No pleural effusion or pleural plaque formation. No pathologic mediastinal adenopathy. No pericardial effusion.  CXR PA/LAT 01/20/17 (previously reviewed by me):  Hyperinflation suggested with flattening of the diaphragms. No parenchymal mass or opacity appreciated. No pleural effusion. Heart normal in size & mediastinum normal in contour.  CARDIAC TTE (09/06/15): LV normal in size with EF 55-60%. No regional wall motion abnormalities & grade 1 diastolic dysfunction. LA & RA normal in size. RV normal in size and function. Mild aortic stenosis without regurgitation. Aortic root normal in size. No mitral stenosis or  regurgitation. No pulmonic stenosis or regurgitation. Trivial tricuspid regurgitation. No pericardial effusion.  LABS 02/12/17 Alpha-1 antitrypsin: MZ (89)  11/25/2017 follow-up COPD, ILD,: Chronic hypoxic respiratory failure Patient returns for a 65-month follow-up.  Patient has known severe COPD with asthma (signifcant BD response on PFT) .  He is on DuoNeb 3-4 times daily.  He has been tried previously on [employer] , Does not feel inhalers work that well. He gets winded with activity . No increased cough . Does have intermittent wheezing from time to time.   Patient has mild ILD changes on CT chest felt to be possibly secondary to previous asbestos exposure.  Patient has been documented to have exertional desaturations and nocturnal desaturations however is been hesitant to start oxygen.  He is now willing to start oxygen.  O2 saturations on arrival to the office were 83%.  He was able to maintain O2 saturations greater than 90% on oxygen on 3l/m .   gets winded with walking long distance.      No Known Allergies  There is no immunization history for the selected administration types on file for this patient.  Past Medical History:  Diagnosis Date  . Aortic stenosis    a. Mild by echo 08/2015.   [name] CAD (coronary artery disease)    a. STEMI 05/2015 s/p DES to LAD.  [name] Claudication [facility])    a. 2009, saw  cardiology, declined ABIs.  [name] COPD (chronic obstructive pulmonary disease) (HCC)    states he has been told by MD he has copd  . Depression   . DJD (degenerative joint disease)   . Dyslipidemia 2016  . HTN (hypertension)   .  Ischemic cardiomyopathy    a. 05/2015: EF 35% at cath, 35-40% by echo. b. EF improved to 55-60% by echo 08/2015.  [name]. Pancreatitis, gallstone 05/2013  . RBBB   . STEMI (ST elevation myocardial infarction) (HCC) 05/20/15   3.0 x 28 mm Promus premier DES to the LAD    Tobacco History: Social History   Tobacco Use  Smoking Status Former Smoker  .  Packs/day: 2.00  . Years: 49.00  . Pack years: 98.00  . Types: Cigarettes  . Start date: 04/26/1939  . Last attempt to quit: 12/10/1987  . Years since quitting: 29.9  Smokeless Tobacco Never Used   Counseling given: Not Answered   Outpatient Encounter Medications as of 11/25/2017  Medication Sig  . albuterol (PROVENTIL HFA;VENTOLIN HFA) 108 (90 Base) MCG/ACT inhaler Inhale 2 puffs into the lungs every 6 (six) hours as needed for wheezing or shortness of breath.  [name]. aspirin EC 81 MG tablet Take 1 tablet (81 mg total) by mouth daily.  [name]. atorvastatin (LIPITOR) 80 MG tablet TAKE 1 TABLET (80 MG TOTAL) BY MOUTH DAILY AT 6 PM.  . carvedilol (COREG) 25 MG tablet TAKE 1 TABLET (25 MG TOTAL) BY MOUTH 2 (TWO) TIMES DAILY WITH A MEAL.  . cholecalciferol (VITAMIN D) 1000 UNITS tablet Take 1,000 Units by mouth daily.  . clopidogrel (PLAVIX) 75 MG tablet TAKE 1 TABLET BY MOUTH DAILY. 06/09/2015 TAKE 4 TABLETS BY MOUTH FOR FIRST DOSE ONLY.  . furosemide (LASIX) 40 MG tablet Take 1 tablet (40 mg total) by mouth daily as needed.  . hydrochlorothiazide (MICROZIDE) 12.5 MG capsule TAKE 1 CAPSULE (12.5 MG TOTAL) BY MOUTH DAILY.  [name]. ipratropium-albuterol (DUONEB) 0.5-2.5 (3) MG/3ML SOLN Take 3 mLs by nebulization every 6 (six) hours as needed.  [name]. losartan (COZAAR) 100 MG tablet TAKE 1 TABLET BY MOUTH EVERY DAY  . nitroGLYCERIN (NITROSTAT) 0.4 MG SL tablet Place 1 tablet (0.4 mg total) under the tongue every 5 (five) minutes as needed for chest pain.  [name]. umeclidinium bromide (INCRUSE ELLIPTA) 62.5 MCG/INH AEPB Inhale 1 puff into the lungs daily.  . budesonide-formoterol (SYMBICORT) 160-4.5 MCG/ACT inhaler Inhale 2 puffs into the lungs 2 (two) times daily.   Facility-Administered Encounter Medications as of 11/25/2017  Medication  . ipratropium-albuterol (DUONEB) 0.5-2.5 (3) MG/3ML nebulizer solution 3 mL     Review of Systems  Constitutional:   No  weight loss, night sweats,  Fevers, chills, + fatigue, or   lassitude.  HEENT:   No headaches,  Difficulty swallowing,  Tooth/dental problems, or  Sore throat,                No sneezing, itching, ear ache, nasal congestion, post nasal drip,   CV:  No chest pain,  Orthopnea, PND, swelling in lower extremities, anasarca, dizziness, palpitations, syncope.   GI  No heartburn, indigestion, abdominal pain, nausea, vomiting, diarrhea, change in bowel habits, loss of appetite, bloody stools.   Resp:    No chest wall deformity  Skin: no rash or lesions.  GU: no dysuria, change in color of urine, no urgency or frequency.  No flank pain, no hematuria   MS:  No joint pain or swelling.  No decreased range of motion.  No back pain.    Physical Exam  BP 134/60 (BP Location: Left Arm, Cuff Size: Normal)   Pulse 60   Ht 5\' 10"  (1.778 m)   Wt 211 lb 3.2 oz (95.8 kg)   SpO2 95%   BMI 30.30 kg/m  GEN: A/Ox3; pleasant , NAD, elderly    HEENT:  [place]/AT,  EACs-clear, TMs-wnl, NOSE-clear, THROAT-clear, no lesions, no postnasal drip or exudate noted.   NECK:  Supple w/ fair ROM; no JVD; normal carotid impulses w/o bruits; no thyromegaly or nodules palpated; no lymphadenopathy.    RESP  Few scattered rhonchi ,   no accessory muscle use, no dullness to percussion  CARD:  RRR, no m/r/g, tr  peripheral edema, pulses intact, no cyanosis or clubbing.  GI:   Soft & nt; nml bowel sounds; no organomegaly or masses detected.   Musco: Warm bil, no deformities or joint swelling noted.   Neuro: alert, no focal deficits noted.    Skin: Warm, no lesions or rashes    Lab Results:  CBC  BNP   ProBNP  Imaging: No results found.   Assessment & Plan:   COPD, severe (HCC) Severe COPD w/ asthma - would like for him to use a controller.  Will try Symbicort . Samples given  May cont on nebs .   Plan  Patient Instructions  Begin oxygen 3 L. Begin Synmbicort 2 puffs Twice daily  , rinse after use.  Use DuoNeb nebulizer 4 times daily. Mucinex DM twice  daily as needed for cough and congestion Follow-up in 2 weeks Iolani Twilley NP or Byrum MD and As needed   Please contact office for sooner follow up if symptoms do not improve or worsen or seek emergency care       Chronic respiratory failure with hypoxia (HCC) Pt has continued desats with ambulation  He has been documented in past for this as well  We discussed the potential complications of prolonged hypoxia  He is willing to start on oxygen   Plan  Begin Oxygen 3l/m .      Rubye Oaks, NP 11/25/2017

## 2017-11-25 NOTE — Assessment & Plan Note (Signed)
Pt has continued desats with ambulation  He has been documented in past for this as well  We discussed the potential complications of prolonged hypoxia  He is willing to start on oxygen   Plan  Begin Oxygen 3l/m .

## 2017-11-25 NOTE — Progress Notes (Signed)
Patient seen in the office today and instructed on use of Symbicort 160/4.79mcg.  Patient expressed understanding and demonstrated technique. Boone Master, CMA 11/25/17

## 2017-11-25 NOTE — Assessment & Plan Note (Signed)
Severe COPD w/ asthma - would like for him to use a controller.  Will try Symbicort . Samples given  May cont on nebs .   Plan  Patient Instructions  Begin oxygen 3 L. Begin Synmbicort 2 puffs Twice daily  , rinse after use.  Use DuoNeb nebulizer 4 times daily. Mucinex DM twice daily as needed for cough and congestion Follow-up in 2 weeks Parrett NP or Byrum MD and As needed   Please contact office for sooner follow up if symptoms do not improve or worsen or seek emergency care

## 2017-12-10 ENCOUNTER — Ambulatory Visit (INDEPENDENT_AMBULATORY_CARE_PROVIDER_SITE_OTHER): Payer: Medicare Other | Admitting: Adult Health

## 2017-12-10 ENCOUNTER — Encounter: Payer: Self-pay | Admitting: Adult Health

## 2017-12-10 DIAGNOSIS — J449 Chronic obstructive pulmonary disease, unspecified: Secondary | ICD-10-CM

## 2017-12-10 DIAGNOSIS — J849 Interstitial pulmonary disease, unspecified: Secondary | ICD-10-CM | POA: Diagnosis not present

## 2017-12-10 DIAGNOSIS — J9611 Chronic respiratory failure with hypoxia: Secondary | ICD-10-CM | POA: Diagnosis not present

## 2017-12-10 NOTE — Assessment & Plan Note (Signed)
Cont on O2 .  

## 2017-12-10 NOTE — Patient Instructions (Signed)
Continue on Oxygen 3 L. Continue on Synmbicort 2 puffs Twice daily  , rinse after use.  Use DuoNeb nebulizer 4 times daily. Mucinex DM twice daily as needed for cough and congestion Follow-up in 3 months Dr. Delton Coombes  and As needed   Please contact office for sooner follow up if symptoms do not improve or worsen or seek emergency care

## 2017-12-10 NOTE — Assessment & Plan Note (Signed)
Stable on current regimen  Plan Patient Instructions  Continue on Oxygen 3 L. Continue on Synmbicort 2 puffs Twice daily  , rinse after use.  Use DuoNeb nebulizer 4 times daily. Mucinex DM twice daily as needed for cough and congestion Follow-up in 3 months Dr. Delton Coombes  and As needed   Please contact office for sooner follow up if symptoms do not improve or worsen or seek emergency care

## 2017-12-10 NOTE — Progress Notes (Signed)
@Patient  ID: Jon Baker, male    DOB: 27-Jul-1932, 84 y.o.   MRN: [id number]  Chief Complaint  Patient presents with  . Follow-up    COPD     Referring provider: [name], Georgina Quint, MD  HPI: 84 year old male former smoker followed for severe COPD (MZ Phenotype) , ILD, nocturnal hypoxemia, history of asbestosis exposure  TEST Jon Baker  PFT 04/03/17: FVC 1.92 L (49%) FEV1 0.97 L (35%) FEV1/FVC 0.51 FEF 25-75 0.34 L (18%) positive bronchodilator response TLC 4.95 L (70%) RV 115% ERV 112% DLCO corrected 49% 01/28/13: FVC 3.50 L (81%) FEV1 1.94 L (70%) FEV1/FVC 0.55 FEF 25-75 0.71 L (30%) negative bronchodilator response TLC 6.14 L (93%) RV 96% ERV 47% DLCO uncorrected 87%  04/03/17: Walked 275 meters / Baseline Sat 96% on RA / Nadir Sat 88% @ 4:24 (required 2 L/m with exertion &patient did have wheezing)  OVERNIGHT OXIMETRY 08/18/17: Performed on room air. Duration 9 hours 44 minutes 21 seconds. Lowest pulse 43 bpm. Lowest saturation 77%. 267.1 minutes with saturation </= 88%.  IMAGING HRCT CHEST W/O 04/11/17 (personally reviewed by me):Basilar predominant subpleural reticulation with craniocaudal progression consistent with an early UIP pattern. No groundglass opacities, honeycomb changes, or traction bronchiectasis. No parenchymal mass or opacity appreciated otherwise. No pleural effusion or pleural plaque formation. No pathologic mediastinal adenopathy. No pericardial effusion.  CXR PA/LAT 01/20/17 (previously reviewed by me):Hyperinflation suggested with flattening of the diaphragms. No parenchymal mass or opacity appreciated. No pleural effusion. Heart normal in size &mediastinum normal in contour.  CARDIAC TTE (09/06/15): LV normal in size with EF 55-60%. No regional wall motion abnormalities & grade 1 diastolic dysfunction. LA & RA normal in size. RV normal in size and function. Mild aortic stenosis without regurgitation. Aortic root normal in size. No mitral  stenosis or regurgitation. No pulmonic stenosis or regurgitation. Trivial tricuspid regurgitation. No pericardial effusion.  LABS 02/12/17 Alpha-1 antitrypsin: MZ (89)  12/10/2017 Follow up : COPD, ILD , O2 RF  Pt returns for 2 week follow up .He has severe COPD . He was started on Symbicort last visit .  Patient says he is tolerating but unclear if it is helping his breathing or not.  Has had some decreased cough.  He was started on Oxygen at 3l/m last visit . He says he is sleeping much better. Does feels it is helping him with his shortness of breath. Says he mainly wears while sleeping . Does not feel he needs it walking around.  Does feel that his shortness of breath overall is quite a bit better..  Patient has ILD changes on CT chest.  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  Declines flu shot . Declines Pneumonia vaccines.   No Known Allergies  There is no immunization history for the selected administration types on file for this patient.  Past Medical History:  Diagnosis Date  . Aortic stenosis    a. Mild by echo 08/2015.   [name] CAD (coronary artery disease)    a. STEMI 05/2015 s/p DES to LAD.  [name] Claudication [facility])    a. 2009, saw  cardiology, declined ABIs.  [name] COPD (chronic obstructive pulmonary disease) (HCC)    states he has been told by MD he has copd  . Depression   . DJD (degenerative joint disease)   . Dyslipidemia 2016  . HTN (hypertension)   . Ischemic cardiomyopathy    a. 05/2015: EF 35% at cath, 35-40% by echo. b. EF improved to 55-60% by  echo 08/2015.  [name] Pancreatitis, gallstone 05/2013  . RBBB   . STEMI (ST elevation myocardial infarction) (HCC) 05/20/15   3.0 x 28 mm Promus premier DES to the LAD    Tobacco History: Social History   Tobacco Use  Smoking Status Former Smoker  . Packs/day: 2.00  . Years: 49.00  . Pack years: 98.00  . Types: Cigarettes  . Start date: 04/26/1939  . Last attempt to quit: 12/10/1987  . Years since quitting: 30.0    Smokeless Tobacco Never Used   Counseling given: Not Answered   Outpatient Encounter Medications as of 12/10/2017  Medication Sig  . albuterol (PROVENTIL HFA;VENTOLIN HFA) 108 (90 Base) MCG/ACT inhaler Inhale 2 puffs into the lungs every 6 (six) hours as needed for wheezing or shortness of breath.  [name] aspirin EC 81 MG tablet Take 1 tablet (81 mg total) by mouth daily.  [name] atorvastatin (LIPITOR) 80 MG tablet TAKE 1 TABLET (80 MG TOTAL) BY MOUTH DAILY AT 6 PM.  . budesonide-formoterol (SYMBICORT) 160-4.5 MCG/ACT inhaler Inhale 2 puffs into the lungs 2 (two) times daily.  . carvedilol (COREG) 25 MG tablet TAKE 1 TABLET (25 MG TOTAL) BY MOUTH 2 (TWO) TIMES DAILY WITH A MEAL.  . cholecalciferol (VITAMIN D) 1000 UNITS tablet Take 1,000 Units by mouth daily.  . clopidogrel (PLAVIX) 75 MG tablet TAKE 1 TABLET BY MOUTH DAILY. 06/09/2015 TAKE 4 TABLETS BY MOUTH FOR FIRST DOSE ONLY.  . furosemide (LASIX) 40 MG tablet Take 1 tablet (40 mg total) by mouth daily as needed.  . hydrochlorothiazide (MICROZIDE) 12.5 MG capsule TAKE 1 CAPSULE (12.5 MG TOTAL) BY MOUTH DAILY.  [name] ipratropium-albuterol (DUONEB) 0.5-2.5 (3) MG/3ML SOLN Take 3 mLs by nebulization every 6 (six) hours as needed.  [name] losartan (COZAAR) 100 MG tablet TAKE 1 TABLET BY MOUTH EVERY DAY  . nitroGLYCERIN (NITROSTAT) 0.4 MG SL tablet Place 1 tablet (0.4 mg total) under the tongue every 5 (five) minutes as needed for chest pain.  . [DISCONTINUED] umeclidinium bromide (INCRUSE ELLIPTA) 62.5 MCG/INH AEPB Inhale 1 puff into the lungs daily.   Facility-Administered Encounter Medications as of 12/10/2017  Medication  . ipratropium-albuterol (DUONEB) 0.5-2.5 (3) MG/3ML nebulizer solution 3 mL     Review of Systems  Constitutional:   No  weight loss, night sweats,  Fevers, chills, fatigue, or  lassitude.  HEENT:   No headaches,  Difficulty swallowing,  Tooth/dental problems, or  Sore throat,                No sneezing, itching, ear ache, nasal  congestion, post nasal drip,   CV:  No chest pain,  Orthopnea, PND, swelling in lower extremities, anasarca, dizziness, palpitations, syncope.   GI  No heartburn, indigestion, abdominal pain, nausea, vomiting, diarrhea, change in bowel habits, loss of appetite, bloody stools.   Resp: No shortness of breath with exertion or at rest.  No excess mucus, no productive cough,  No non-productive cough,  No coughing up of blood.  No change in color of mucus.  No wheezing.  No chest wall deformity  Skin: no rash or lesions.  GU: no dysuria, change in color of urine, no urgency or frequency.  No flank pain, no hematuria   MS:  No joint pain or swelling.  No decreased range of motion.  No back pain.    Physical Exam  BP 138/80   Pulse 80   Ht 5' 10.5" (1.791 m)   Wt 212 lb 4 oz (96.3 kg)  SpO2 95%   BMI 30.02 kg/m   GEN: A/Ox3; pleasant , NAD, elderly   HEENT:  [place]/AT,  EACs-clear, TMs-wnl, NOSE-clear, THROAT-clear, no lesions, no postnasal drip or exudate noted.   NECK:  Supple w/ fair ROM; no JVD; normal carotid impulses w/o bruits; no thyromegaly or nodules palpated; no lymphadenopathy.    RESP few trace rhonchi,  no accessory muscle use, no dullness to percussion  CARD:  RRR, no m/r/g, no peripheral edema, pulses intact, no cyanosis or clubbing.  GI:   Soft & nt; nml bowel sounds; no organomegaly or masses detected.   Musco: Warm bil, no deformities or joint swelling noted.   Neuro: alert, no focal deficits noted.    Skin: Warm, no lesions or rashes    Lab Results:  CBC  BMET  Imaging: No results found.   Assessment & Plan:   COPD, severe (HCC) Stable on current regimen  Plan Patient Instructions  Continue on Oxygen 3 L. Continue on Synmbicort 2 puffs Twice daily  , rinse after use.  Use DuoNeb nebulizer 4 times daily. Mucinex DM twice daily as needed for cough and congestion Follow-up in 3 months Dr. Delton Coombes  and As needed   Please contact office for sooner  follow up if symptoms do not improve or worsen or seek emergency care        ILD (interstitial lung disease) (HCC) Follow up PFt in 3 months   Chronic respiratory failure with hypoxia (HCC) Cont on O2      Tyshia Fenter, NP 12/10/2017

## 2017-12-10 NOTE — Assessment & Plan Note (Signed)
Follow up PFt in 3 months

## 2017-12-23 ENCOUNTER — Ambulatory Visit: Payer: Medicare Other | Admitting: Internal Medicine

## 2017-12-24 ENCOUNTER — Encounter: Payer: Self-pay | Admitting: Internal Medicine

## 2017-12-24 ENCOUNTER — Other Ambulatory Visit (INDEPENDENT_AMBULATORY_CARE_PROVIDER_SITE_OTHER): Payer: Medicare Other

## 2017-12-24 ENCOUNTER — Ambulatory Visit (INDEPENDENT_AMBULATORY_CARE_PROVIDER_SITE_OTHER)
Admission: RE | Admit: 2017-12-24 | Discharge: 2017-12-24 | Disposition: A | Discharge status: Home / Self Care | Payer: Medicare Other | Source: Ambulatory Visit | Attending: Internal Medicine | Admitting: Internal Medicine

## 2017-12-24 ENCOUNTER — Ambulatory Visit (INDEPENDENT_AMBULATORY_CARE_PROVIDER_SITE_OTHER): Payer: Medicare Other | Admitting: Internal Medicine

## 2017-12-24 DIAGNOSIS — I5042 Chronic combined systolic (congestive) and diastolic (congestive) heart failure: Secondary | ICD-10-CM

## 2017-12-24 DIAGNOSIS — J9611 Chronic respiratory failure with hypoxia: Secondary | ICD-10-CM | POA: Diagnosis not present

## 2017-12-24 DIAGNOSIS — R0902 Hypoxemia: Secondary | ICD-10-CM

## 2017-12-24 DIAGNOSIS — K5901 Slow transit constipation: Secondary | ICD-10-CM | POA: Diagnosis not present

## 2017-12-24 DIAGNOSIS — R1900 Intra-abdominal and pelvic swelling, mass and lump, unspecified site: Secondary | ICD-10-CM | POA: Diagnosis not present

## 2017-12-24 DIAGNOSIS — K5900 Constipation, unspecified: Secondary | ICD-10-CM | POA: Insufficient documentation

## 2017-12-24 LAB — BASIC METABOLIC PANEL
BUN: 19 mg/dL (ref 6–23)
CHLORIDE: 97 meq/L (ref 96–112)
CO2: 39 mEq/L — ABNORMAL HIGH (ref 19–32)
Calcium: 9.4 mg/dL (ref 8.4–10.5)
Creatinine, Ser: 0.99 mg/dL (ref 0.40–1.50)
GFR: 76.61 mL/min (ref >60.00)
Glucose, Bld: 89 mg/dL (ref 70–99)
POTASSIUM: 3.8 meq/L (ref 3.5–5.1)
SODIUM: 141 meq/L (ref 135–145)

## 2017-12-24 NOTE — Progress Notes (Signed)
Subjective:  Patient ID: Jon Baker, male    DOB: December 16, 1931  Age: 84 y.o. MRN: [id number]  CC: No chief complaint on file.   HPI Roderic Lammert presents for COPD/PF, CHF, HTN f/u. Better on O2: using at night only due to variety of reasons. LE swelling is better, abd is still big  Outpatient Medications Prior to Visit  Medication Sig Dispense Refill  . albuterol (PROVENTIL HFA;VENTOLIN HFA) 108 (90 Base) MCG/ACT inhaler Inhale 2 puffs into the lungs every 6 (six) hours as needed for wheezing or shortness of breath. 1 Inhaler 5  . aspirin EC 81 MG tablet Take 1 tablet (81 mg total) by mouth daily. 100 tablet 3  . atorvastatin (LIPITOR) 80 MG tablet TAKE 1 TABLET (80 MG TOTAL) BY MOUTH DAILY AT 6 PM. 30 tablet 11  . budesonide-formoterol (SYMBICORT) 160-4.5 MCG/ACT inhaler Inhale 2 puffs into the lungs 2 (two) times daily. 1 Inhaler 0  . carvedilol (COREG) 25 MG tablet TAKE 1 TABLET (25 MG TOTAL) BY MOUTH 2 (TWO) TIMES DAILY WITH A MEAL. 60 tablet 11  . cholecalciferol (VITAMIN D) 1000 UNITS tablet Take 1,000 Units by mouth daily.    . clopidogrel (PLAVIX) 75 MG tablet TAKE 1 TABLET BY MOUTH DAILY. 06/09/2015 TAKE 4 TABLETS BY MOUTH FOR FIRST DOSE ONLY. 90 tablet 2  . furosemide (LASIX) 40 MG tablet Take 1 tablet (40 mg total) by mouth daily as needed. 30 tablet 11  . hydrochlorothiazide (MICROZIDE) 12.5 MG capsule TAKE 1 CAPSULE (12.5 MG TOTAL) BY MOUTH DAILY. 30 capsule 0  . ipratropium-albuterol (DUONEB) 0.5-2.5 (3) MG/3ML SOLN Take 3 mLs by nebulization every 6 (six) hours as needed. 360 mL 3  . losartan (COZAAR) 100 MG tablet TAKE 1 TABLET BY MOUTH EVERY DAY 30 tablet 11  . nitroGLYCERIN (NITROSTAT) 0.4 MG SL tablet Place 1 tablet (0.4 mg total) under the tongue every 5 (five) minutes as needed for chest pain. 25 tablet 3   Facility-Administered Medications Prior to Visit  Medication Dose Route Frequency Provider Last Rate Last Dose  . ipratropium-albuterol (DUONEB) 0.5-2.5 (3)  MG/3ML nebulizer solution 3 mL  3 mL Nebulization Q6H Saguier, Edward, PA-C        ROS Review of Systems  Constitutional: Positive for fatigue. Negative for appetite change and unexpected weight change.  HENT: Negative for congestion, nosebleeds, sneezing, sore throat and trouble swallowing.   Eyes: Negative for itching and visual disturbance.  Respiratory: Positive for shortness of breath. Negative for cough.   Cardiovascular: Positive for leg swelling. Negative for chest pain and palpitations.  Gastrointestinal: Negative for abdominal distention, blood in stool, diarrhea and nausea.  Genitourinary: Negative for frequency and hematuria.  Musculoskeletal: Negative for back pain, gait problem, joint swelling and neck pain.  Skin: Negative for rash.  Neurological: Negative for dizziness, tremors, speech difficulty and weakness.  Psychiatric/Behavioral: Negative for agitation, dysphoric mood and sleep disturbance. The patient is not nervous/anxious.     Objective:  BP 130/84 (BP Location: Left Arm, Patient Position: Sitting, Cuff Size: Large)   Pulse (!) 49   Temp 98 F (36.7 C) (Oral)   Ht 5' 10.5" (1.791 m)   Wt 210 lb (95.3 kg)   SpO2 96%   BMI 29.71 kg/m   BP Readings from Last 3 Encounters:  12/24/17 130/84  12/10/17 138/80  11/25/17 134/60    Wt Readings from Last 3 Encounters:  12/24/17 210 lb (95.3 kg)  12/10/17 212 lb 4 oz (96.3 kg)  11/25/17 211  lb 3.2 oz (95.8 kg)    Physical Exam  Constitutional: He is oriented to person, place, and time. He appears well-developed. No distress.  NAD  HENT:  Mouth/Throat: Oropharynx is clear and moist.  Eyes: Conjunctivae are normal. Pupils are equal, round, and reactive to light.  Neck: Normal range of motion. No JVD present. No thyromegaly present.  Cardiovascular: Normal rate, regular rhythm, normal heart sounds and intact distal pulses. Exam reveals no gallop and no friction rub.  No murmur heard. Pulmonary/Chest: Effort  normal. No respiratory distress. He has no wheezes. He has rales. He exhibits no tenderness.  Abdominal: Soft. Bowel sounds are normal. He exhibits no distension and no mass. There is no tenderness. There is no rebound and no guarding.  Musculoskeletal: Normal range of motion. He exhibits edema. He exhibits no tenderness.  Lymphadenopathy:    He has no cervical adenopathy.  Neurological: He is alert and oriented to person, place, and time. He has normal reflexes. No cranial nerve deficit. He exhibits normal muscle tone. He displays a negative Romberg sign. Coordination and gait normal.  Skin: Skin is warm and dry. No rash noted.  Psychiatric: He has a normal mood and affect. His behavior is normal. Judgment and thought content normal.  B crackles Trace edema B LE abd - large, feels nl  Lab Results  Component Value Date   WBC 9.6 05/23/2015   HGB 15.7 05/23/2015   HCT 44.2 05/23/2015   PLT 259 05/23/2015   GLUCOSE 119 (H) 07/09/2017   CHOL 125 07/20/2015   TRIG 136.0 07/20/2015   HDL 25.40 (L) 07/20/2015   LDLCALC 72 07/20/2015   ALT 32 08/01/2015   AST 27 08/01/2015   NA 138 07/09/2017   K 3.6 07/09/2017   CL 101 07/09/2017   CREATININE 1.06 07/09/2017   BUN 14 07/09/2017   CO2 31 07/09/2017   TSH 2.04 03/31/2015   PSA 4.51 (H) 03/31/2015   INR 1.10 05/20/2015   HGBA1C 5.6 05/22/2015    Ct Chest High Resolution  Result Date: 04/11/2017 CLINICAL DATA:  Asbestos exposure.  Restrictive lung disease.  COPD. EXAM: CT CHEST WITHOUT CONTRAST TECHNIQUE: Multidetector CT imaging of the chest was performed following the standard protocol without intravenous contrast. High resolution imaging of the lungs, as well as inspiratory and expiratory imaging, was performed. COMPARISON:  01/20/2017 chest radiograph. FINDINGS: Cardiovascular: Normal heart size. No significant pericardial fluid/thickening. Left main, left anterior descending, left circumflex and right coronary atherosclerosis.  Atherosclerotic thoracic aorta with ectatic 4.3 cm ascending thoracic aorta. Normal caliber pulmonary arteries. Mediastinum/Nodes: Dominant hypodense 1.7 cm left thyroid lobe nodule. Unremarkable esophagus. No pathologically enlarged axillary, mediastinal or gross hilar lymph nodes, noting limited sensitivity for the detection of hilar adenopathy on this noncontrast study. Lungs/Pleura: No pneumothorax. No evidence of calcified pleural plaques. No pleural effusion. Moderate centrilobular emphysema with diffuse bronchial wall thickening. No acute consolidative airspace disease, lung masses or significant pulmonary nodules. There is basilar predominant subpleural reticulation and ground-glass attenuation in both lungs with associated mild traction bronchiolectasis. No frank honeycombing. No significant lobular air trapping on the expiration sequence. Upper abdomen: Cholecystectomy. Musculoskeletal: No aggressive appearing focal osseous lesions. Moderate thoracic spondylosis. Nonspecific T5 vertebral sclerotic lesion, probably a benign bone island. IMPRESSION: 1. No calcified pleural plaques or pleural effusions. 2. 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 Follow-up high-resolution chest CT in 12 months recommended to assess temporal pattern stability. 3. Aortic atherosclerosis. Ectatic 4.3 cm ascending thoracic aorta. Recommend annual imaging followup by CTA or MRA. This recommendation follows 2010 ACCF/AHA/AATS/ACR/ASA/SCA/SCAI/SIR/STS/SVM Guidelines for the Diagnosis and Management of Patients with Thoracic Aortic Disease. Circulation. 2010; 121: Z610-R604. 4. Left main and 3 vessel coronary atherosclerosis. Electronically Signed   By: Delbert Phenix M.D.   On: 04/11/2017 12:57    Assessment & Plan:    There are no diagnoses linked to this encounter. I am having [name] maintain his aspirin EC, cholecalciferol, albuterol, nitroGLYCERIN, carvedilol, ipratropium-albuterol, hydrochlorothiazide, clopidogrel, atorvastatin, losartan, furosemide, and budesonide-formoterol. We will continue to administer ipratropium-albuterol.  No orders of the defined types were placed in this encounter.    Follow-up: No Follow-up on file.  Sonda Primes, MD

## 2017-12-24 NOTE — Assessment & Plan Note (Signed)
On O2 

## 2017-12-24 NOTE — Assessment & Plan Note (Signed)
O2

## 2017-12-24 NOTE — Patient Instructions (Addendum)
Take Linzess 1 a day x 2-5 days to clean your bowels You can add Miralax 1-2 scoops a day if needed

## 2017-12-24 NOTE — Assessment & Plan Note (Signed)
X ray Take Linzess 1 a day x 2-5 days to clean your bowels You can add Miralax 1-2 scoops a day if needed

## 2017-12-24 NOTE — Assessment & Plan Note (Signed)
Better on O2 

## 2017-12-26 ENCOUNTER — Telehealth: Payer: Self-pay | Admitting: Internal Medicine

## 2017-12-26 NOTE — Telephone Encounter (Signed)
Wife notified.

## 2017-12-26 NOTE — Telephone Encounter (Signed)
Copied from CRM [id number]. Topic: Quick Communication - See Telephone Encounter >> Dec 26, 2017 10:10 AM Rudi Coco, NT wrote: CRM for notification. See Telephone encounter for:   12/26/17. Pt. Calling to get results from x-ray of the stomach done on wed. Pt. Can be reached at [phone]

## 2018-01-16 ENCOUNTER — Other Ambulatory Visit: Payer: Self-pay | Admitting: Nurse Practitioner

## 2018-01-16 DIAGNOSIS — Z20828 Contact with and (suspected) exposure to other viral communicable diseases: Secondary | ICD-10-CM

## 2018-01-16 MED ORDER — OSELTAMIVIR PHOSPHATE 75 MG PO CAPS: 75.0000 mg | ORAL_CAPSULE | Freq: Every day | ORAL | 0 refills | Status: DC

## 2018-01-16 NOTE — Progress Notes (Signed)
Treated his wife: Jon Baker 01/16/2018. She tested positive for influenza A.

## 2018-01-21 ENCOUNTER — Other Ambulatory Visit: Payer: Self-pay | Admitting: Internal Medicine

## 2018-02-10 ENCOUNTER — Telehealth: Payer: Self-pay | Admitting: Adult Health

## 2018-02-10 NOTE — Telephone Encounter (Signed)
Spoke with patient, he is needing a sample of the Symbicort 160. Placed up front for patient pick up.

## 2018-02-16 ENCOUNTER — Telehealth: Payer: Self-pay | Admitting: Internal Medicine

## 2018-02-16 NOTE — Telephone Encounter (Signed)
Patients wife says this is no emergency today---but patient seems to need more oxygen or something else to breathe better---appt has been made with dr Jordan Likes

## 2018-02-16 NOTE — Telephone Encounter (Signed)
Copied from CRM [id number]. Topic: General - Other >> Feb 16, 2018 11:39 AM Cecelia Byars, RMA wrote: Reason for CRM: patient's daughter is requesting a call back concerning patient's oxygen

## 2018-02-17 ENCOUNTER — Ambulatory Visit (INDEPENDENT_AMBULATORY_CARE_PROVIDER_SITE_OTHER): Payer: Medicare Other | Admitting: Family Medicine

## 2018-02-17 ENCOUNTER — Other Ambulatory Visit (INDEPENDENT_AMBULATORY_CARE_PROVIDER_SITE_OTHER): Payer: Medicare Other

## 2018-02-17 ENCOUNTER — Ambulatory Visit (INDEPENDENT_AMBULATORY_CARE_PROVIDER_SITE_OTHER)
Admission: RE | Admit: 2018-02-17 | Discharge: 2018-02-17 | Disposition: A | Discharge status: Home / Self Care | Payer: Medicare Other | Source: Ambulatory Visit | Attending: Family Medicine | Admitting: Family Medicine

## 2018-02-17 ENCOUNTER — Encounter: Payer: Self-pay | Admitting: Family Medicine

## 2018-02-17 VITALS — BP 132/88 | HR 62 | Temp 98.3°F | Ht 70.0 in | Wt 208.1 lb

## 2018-02-17 DIAGNOSIS — R05 Cough: Secondary | ICD-10-CM

## 2018-02-17 DIAGNOSIS — I5042 Chronic combined systolic (congestive) and diastolic (congestive) heart failure: Secondary | ICD-10-CM | POA: Diagnosis not present

## 2018-02-17 DIAGNOSIS — R059 Cough, unspecified: Secondary | ICD-10-CM

## 2018-02-17 LAB — BASIC METABOLIC PANEL
BUN: 13 mg/dL (ref 6–23)
CHLORIDE: 95 meq/L — AB (ref 96–112)
CO2: 39 mEq/L — ABNORMAL HIGH (ref 19–32)
Calcium: 9.5 mg/dL (ref 8.4–10.5)
Creatinine, Ser: 0.9 mg/dL (ref 0.40–1.50)
GFR: 85.48 mL/min (ref >60.00)
Glucose, Bld: 103 mg/dL — ABNORMAL HIGH (ref 70–99)
Potassium: 3.3 mEq/L — ABNORMAL LOW (ref 3.5–5.1)
SODIUM: 138 meq/L (ref 135–145)

## 2018-02-17 LAB — CBC WITH DIFFERENTIAL/PLATELET
Basophils Absolute: 0 10*3/uL (ref 0.0–0.1)
Basophils Relative: 0.4 % (ref 0.0–3.0)
Eosinophils Absolute: 1.3 10*3/uL — ABNORMAL HIGH (ref 0.0–0.7)
Eosinophils Relative: 11.5 % — ABNORMAL HIGH (ref 0.0–5.0)
HCT: 40.2 % (ref 39.0–52.0)
Hemoglobin: 14.2 g/dL (ref 13.0–17.0)
LYMPHS ABS: 1.7 10*3/uL (ref 0.7–4.0)
Lymphocytes Relative: 14.4 % (ref 12.0–46.0)
MCHC: 35.3 g/dL (ref 30.0–36.0)
MCV: 93.1 fl (ref 78.0–100.0)
MONO ABS: 1.2 10*3/uL — AB (ref 0.1–1.0)
Monocytes Relative: 10.2 % (ref 3.0–12.0)
NEUTROS PCT: 63.5 % (ref 43.0–77.0)
Neutro Abs: 7.3 10*3/uL (ref 1.4–7.7)
Platelets: 286 10*3/uL (ref 150.0–400.0)
RBC: 4.32 Mil/uL (ref 4.22–5.81)
RDW: 13.1 % (ref 11.5–15.5)
WBC: 11.5 10*3/uL — AB (ref 4.0–10.5)

## 2018-02-17 LAB — BRAIN NATRIURETIC PEPTIDE: PRO B NATRI PEPTIDE: 103 pg/mL — AB (ref 0.0–100.0)

## 2018-02-17 MED ORDER — PREDNISONE 50 MG PO TABS: 50.0000 mg | ORAL_TABLET | Freq: Every day | ORAL | 0 refills | Status: DC

## 2018-02-17 MED ORDER — DOXYCYCLINE HYCLATE 100 MG PO TABS: 100.0000 mg | ORAL_TABLET | Freq: Two times a day (BID) | ORAL | 0 refills | Status: DC

## 2018-02-17 NOTE — Progress Notes (Signed)
Jon Baker - 84 y.o. male MRN [id number]  Date of birth: [birth year]  SUBJECTIVE:  Including CC & ROS.  Chief Complaint  Patient presents with  . Breathing Problem    Started last week; Feels like he isn't breathing; Having trouble catching his breath; Goes into cough spells with mucus also    Jon Baker is a 84 y.o. male that is presenting with acute on chronic shortness of breath. He has COPD and combined heart failure.He last saw his pulmonologist a month ago. His symptoms started worsening over the past week and much worse the past 3 days. He is having to sit up in a chair at night. He is having to turn his oxygen amount and wear through the se of the day. He is hag a significant a little to no production. He also has some leg swelling bilaterally and tightness in his belt.He denies any changes medications. He denies having any fevers. His wife was diagnosed with the flu and he was taking Tamiflu..  Review his echo from 2016 shows 55-60% ejection fraction with grade 1 diastolic dysfunction.  Review of CT chest from 2018 shows findings suggestive of a dependent basilar predominant fibrotic interstitial lung disease without honeycombing.   Review of Systems  Constitutional: Negative for fever.  HENT: Negative for congestion.   Eyes: Negative for visual disturbance.  Respiratory: Positive for cough and shortness of breath.   Cardiovascular: Negative for chest pain.  Gastrointestinal: Negative for abdominal pain.  Endocrine: Negative for polyuria.  Musculoskeletal: Negative for gait problem.  Skin: Negative for color change.  Neurological: Negative for weakness.  Hematological: Negative for adenopathy.  Psychiatric/Behavioral: Negative for agitation.    HISTORY: Past Medical, Surgical, Social, and Family History Reviewed & Updated per EMR.   Pertinent Historical Findings include:  Past Medical History:  Diagnosis Date  . Aortic stenosis    a. Mild by echo 08/2015.   [name]  CAD (coronary artery disease)    a. STEMI 05/2015 s/p DES to LAD.  [name] Claudication [facility])    a. 2009, saw  cardiology, declined ABIs.  [name] COPD (chronic obstructive pulmonary disease) (HCC)    states he has been told by MD he has copd  . Depression   . DJD (degenerative joint disease)   . Dyslipidemia 2016  . HTN (hypertension)   . Ischemic cardiomyopathy    a. 05/2015: EF 35% at cath, 35-40% by echo. b. EF improved to 55-60% by echo 08/2015.  [name] Pancreatitis, gallstone 05/2013  . RBBB   . STEMI (ST elevation myocardial infarction) (HCC) 05/20/15   3.0 x 28 mm Promus premier DES to the LAD    Past Surgical History:  Procedure Laterality Date  . CARDIAC CATHETERIZATION N/A 05/20/2015   Procedure: Left Heart Cath and Coronary Angiography;  Surgeon: [name], MD; LAD 99%, circumflex 50%, RCA 40%, EF 35% with akinesis in the mid anterior wall apex and inferior apex   . CARDIAC CATHETERIZATION N/A 05/20/2015   Procedure: Coronary Stent Intervention;  Surgeon: [name], MD;  3.0 x 28 mm Promus premier DES to the LAD  . CHOLECYSTECTOMY N/A 06/06/2013   Procedure: LAPAROSCOPIC CHOLECYSTECTOMY WITH INTRAOPERATIVE CHOLANGIOGRAM;  Surgeon: Liz Malady, MD;  Location: [facility] OR;  Service: General;  Laterality: N/A;  . Glanglian cyst removal Left 1970's  . myleogram  1979  . TOTAL KNEE ARTHROPLASTY Right 02-2011   Dr Despina Hick    No Known Allergies  Family History  Problem Relation Age of Onset  .  Pancreatic cancer Mother   . Coronary artery disease Mother   . Coronary artery disease Father        several MI'S  . Breast cancer Sister   . Diabetes Neg Hx   . Lung disease Neg Hx      Social History   Socioeconomic History  . Marital status: Married    Spouse name: Not on file  . Number of children: Not on file  . Years of education: Not on file  . Highest education level: Not on file  Social Needs  . Financial resource strain: Not on file  . Food insecurity - worry: Not on file    . Food insecurity - inability: Not on file  . Transportation needs - medical: Not on file  . Transportation needs - non-medical: Not on file  Occupational History  . Not on file  Tobacco Use  . Smoking status: Former Smoker    Packs/day: 2.00    Years: 49.00    Pack years: 98.00    Types: Cigarettes    Start date: 04/26/1939    Last attempt to quit: 12/10/1987    Years since quitting: 30.2  . Smokeless tobacco: Never Used  Substance and Sexual Activity  . Alcohol use: No  . Drug use: No  . Sexual activity: Not on file  Other Topics Concern  . Not on file  Social History Narrative   [facility] (02/12/17):   Originally from [facility]. 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 No pets currently. No bird or mold exposure. Doesn't have any hobbies currently. Previously enjoyed square dancing.      PHYSICAL EXAM:  VS: BP 132/88 (BP Location: Left Arm, Patient Position: Sitting, Cuff Size: Normal)   Pulse 62   Temp 98.3 F (36.8 C) (Oral)   Ht 5\' 10"  (1.778 m)   Wt 208 lb 1.9 oz (94.4 kg)   SpO2 94%   BMI 29.86 kg/m  Physical Exam Gen: NAD, alert, cooperative with exam, nasal cannula in place ENT: normal lips, normal nasal mucosa,  Eye: normal EOM, normal conjunctiva and lids CV:  +1 pitting edema, +2 pedal pulses, regular rate and rhythm, S1-S2   Resp: no accessory muscle use, non-labored,no crackles or wheezing, Skin: no rashes, no areas of induration  Neuro: normal tone, normal sensation to touch Psych:  normal insight, alert and oriented MSK: normal gait, normal strength       ASSESSMENT & PLAN:   Cough Unclear see is having a COPD exacerbation versus a heart failure exacerbation or combination of both.  - Chest x-ray, BMP, BNP and CBC - called wife and will send in prednisone and doxy. Advised to follow up if no improvement  - given  indications to seek immediate care.

## 2018-02-17 NOTE — Assessment & Plan Note (Addendum)
Unclear see is having a COPD exacerbation versus a heart failure exacerbation or combination of both.  - Chest x-ray, BMP, BNP and CBC - called wife and will send in prednisone and doxy. Advised to follow up if no improvement  - given indications to seek immediate care.

## 2018-02-17 NOTE — Patient Instructions (Signed)
I will call you with the lab work from today.

## 2018-02-27 MED ORDER — BUDESONIDE-FORMOTEROL FUMARATE 160-4.5 MCG/ACT IN AERO: 2.0000 | INHALATION_SPRAY | Freq: Two times a day (BID) | RESPIRATORY_TRACT | 11 refills | Status: DC

## 2018-02-27 NOTE — Telephone Encounter (Signed)
Needs Symbicort Thx

## 2018-03-06 ENCOUNTER — Other Ambulatory Visit: Payer: Self-pay | Admitting: Cardiovascular Disease

## 2018-03-16 ENCOUNTER — Encounter: Payer: Self-pay | Admitting: Emergency Medicine

## 2018-03-16 ENCOUNTER — Ambulatory Visit (INDEPENDENT_AMBULATORY_CARE_PROVIDER_SITE_OTHER): Payer: Medicare Other | Admitting: Emergency Medicine

## 2018-03-16 DIAGNOSIS — G4734 Idiopathic sleep related nonobstructive alveolar hypoventilation: Secondary | ICD-10-CM

## 2018-03-16 DIAGNOSIS — J849 Interstitial pulmonary disease, unspecified: Secondary | ICD-10-CM | POA: Diagnosis not present

## 2018-03-16 DIAGNOSIS — J449 Chronic obstructive pulmonary disease, unspecified: Secondary | ICD-10-CM | POA: Diagnosis not present

## 2018-03-16 NOTE — Assessment & Plan Note (Signed)
Continue O2 as ordered.  

## 2018-03-16 NOTE — Assessment & Plan Note (Signed)
Difficult history giver. He had an apparent AE 1 month ago, did appear to improved from pred + doxy. He also took his lasix at the time, ? how much impact this had. He has decreased symbicort to qd because he isn';t sur ethat it helps him. He is on DuoNeb so the symbicort may be redundant. I will stoip it, continue the duoneb on a schedule. Continue albuterol prn. He doesn';t want to use o2 during the day. Unclear that he needs it. I will defer walking oximetry for now.

## 2018-03-16 NOTE — Progress Notes (Signed)
Subjective:    Patient ID: Jon Baker, male    DOB: 07-18-1932, 84 y.o.   MRN: [id number]  HPI 84 year old former smoker (98 pack years) with asbestos exposure. He has a history of coronary disease, atrial stenosis,  hypertension, ischemic cardiomyopathy.  He has been followed here by Dr. Jamison Neighbor for COPD, interstitial lung disease (without pleural manifestations), nocturnal hypoxemia on 3L/min. No longer uses O2 w exertion.  He has been treated with symbicort, decreased since last time to qd. He uses DuoNeb tid on a schedule.  He was treated with doxy + pred beginning of March for a suspected AE-COPD. Was experiencing cough and purulent sputum. He had low energy > has been better since he was treated. He has furosemide on his med list prn, hasn't used for a few weeks.    Review of Systems  Past Medical History:  Diagnosis Date  . Aortic stenosis    a. Mild by echo 08/2015.   [name] CAD (coronary artery disease)    a. STEMI 05/2015 s/p DES to LAD.  [name] Claudication [facility])    a. 2009, saw  cardiology, declined ABIs.  [name] COPD (chronic obstructive pulmonary disease) (HCC)    states he has been told by MD he has copd  . Depression   . DJD (degenerative joint disease)   . Dyslipidemia 2016  . HTN (hypertension)   . Ischemic cardiomyopathy    a. 05/2015: EF 35% at cath, 35-40% by echo. b. EF improved to 55-60% by echo 08/2015.  [name] Pancreatitis, gallstone 05/2013  . RBBB   . STEMI (ST elevation myocardial infarction) (HCC) 05/20/15   3.0 x 28 mm Promus premier DES to the LAD     Family History  Problem Relation Age of Onset  . Pancreatic cancer Mother   . Coronary artery disease Mother   . Coronary artery disease Father        several MI'S  . Breast cancer Sister   . Diabetes Neg Hx   . Lung disease Neg Hx      Social History   Socioeconomic History  . Marital status: Married    Spouse name: Not on file  . Number of children: Not on file  . Years of education: Not on file  . Highest  education level: Not on file  Occupational History  . Not on file  Social Needs  . Financial resource strain: Not on file  . Food insecurity:    Worry: Not on file    Inability: Not on file  . Transportation needs:    Medical: Not on file    Non-medical: Not on file  Tobacco Use  . Smoking status: Former Smoker    Packs/day: 2.00    Years: 49.00    Pack years: 98.00    Types: Cigarettes    Start date: 04/26/1939    Last attempt to quit: 12/10/1987    Years since quitting: 30.2  . Smokeless tobacco: Never Used  Substance and Sexual Activity  . Alcohol use: No  . Drug use: No  . Sexual activity: Not on file  Lifestyle  . Physical activity:    Days per week: Not on file    Minutes per session: Not on file  . Stress: Not on file  Relationships  . Social connections:    Talks on phone: Not on file    Gets together: Not on file    Attends religious service: Not on file    Active member of club  or organization: Not on file    Attends meetings of clubs or organizations: Not on file    Relationship status: Not on file  . Intimate partner violence:    Fear of current or ex partner: Not on file    Emotionally abused: Not on file    Physically abused: Not on file    Forced sexual activity: Not on file  Other Topics Concern  . Not on file  Social History Narrative   [facility] (02/12/17):   Originally from [facility]. 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 No pets currently. No bird or mold exposure. Doesn't have any hobbies currently. Previously enjoyed square dancing.   He worked on Medical illustrator as a Curator, asbestos shingles roofing, Network engineer.  [place] native.   No Known Allergies   Outpatient Medications Prior to Visit  Medication Sig Dispense Refill  . albuterol (PROVENTIL HFA;VENTOLIN HFA) 108 (90 Base) MCG/ACT inhaler Inhale 2 puffs into the lungs  every 6 (six) hours as needed for wheezing or shortness of breath. 1 Inhaler 5  . aspirin EC 81 MG tablet Take 1 tablet (81 mg total) by mouth daily. 100 tablet 3  . atorvastatin (LIPITOR) 80 MG tablet TAKE 1 TABLET (80 MG TOTAL) BY MOUTH DAILY AT 6 PM. 30 tablet 11  . budesonide-formoterol (SYMBICORT) 160-4.5 MCG/ACT inhaler Inhale 2 puffs into the lungs 2 (two) times daily. 1 Inhaler 11  . carvedilol (COREG) 25 MG tablet TAKE 1 TABLET (25 MG TOTAL) BY MOUTH 2 (TWO) TIMES DAILY WITH A MEAL. 60 tablet 10  . cholecalciferol (VITAMIN D) 1000 UNITS tablet Take 1,000 Units by mouth daily.    . clopidogrel (PLAVIX) 75 MG tablet Take 1 tablet (75 mg total) by mouth daily. 90 tablet 2  . furosemide (LASIX) 40 MG tablet Take 1 tablet (40 mg total) by mouth daily as needed. 30 tablet 11  . hydrochlorothiazide (MICROZIDE) 12.5 MG capsule TAKE 1 CAPSULE (12.5 MG TOTAL) BY MOUTH DAILY. 30 capsule 0  . ipratropium-albuterol (DUONEB) 0.5-2.5 (3) MG/3ML SOLN Take 3 mLs by nebulization every 6 (six) hours as needed. 360 mL 3  . losartan (COZAAR) 100 MG tablet TAKE 1 TABLET BY MOUTH EVERY DAY 30 tablet 11  . nitroGLYCERIN (NITROSTAT) 0.4 MG SL tablet Place 1 tablet (0.4 mg total) under the tongue every 5 (five) minutes as needed for chest pain. 25 tablet 3  . doxycycline (VIBRA-TABS) 100 MG tablet Take 1 tablet (100 mg total) by mouth 2 (two) times daily. 20 tablet 0  . oseltamivir (TAMIFLU) 75 MG capsule Take 1 capsule (75 mg total) by mouth daily. 10 capsule 0  . predniSONE (DELTASONE) 50 MG tablet Take 1 tablet (50 mg total) by mouth daily with breakfast. For 5 days 5 tablet 0   Facility-Administered Medications Prior to Visit  Medication Dose Route Frequency Provider Last Rate Last Dose  . ipratropium-albuterol (DUONEB) 0.5-2.5 (3) MG/3ML nebulizer solution 3 mL  3 mL Nebulization Q6H Saguier, Edward, PA-C             Objective:   Physical Exam   Vitals:   03/16/18 1448  BP: 128/90  Pulse: 61    SpO2: 96%  Weight: 202 lb (91.6 kg)  Height: 5\' 10"  (1.778 m)   Gen: Pleasant, elderly man, in no distress, somewhat stoic  ENT: No lesions,  mouth clear,  oropharynx clear, no postnasal drip  Neck: No JVD, no TMG, no carotid bruits  Lungs: No use of accessory muscles, no crackles or wheezes.   Cardiovascular: RRR, heart sounds normal, no murmur or gallops, no peripheral edema  Musculoskeletal: No deformities, no cyanosis or clubbing  Neuro: alert, non focal  Skin: Warm, no lesions or rashes    High res CT chest 04/11/17 -  COMPARISON:  01/20/2017 chest radiograph.  FINDINGS: Cardiovascular: Normal heart size. No significant pericardial fluid/thickening. Left main, left anterior descending, left circumflex and right coronary atherosclerosis. Atherosclerotic thoracic aorta with ectatic 4.3 cm ascending thoracic aorta. Normal caliber pulmonary arteries.  Mediastinum/Nodes: Dominant hypodense 1.7 cm left thyroid lobe nodule. Unremarkable esophagus. No pathologically enlarged axillary, mediastinal or gross hilar lymph nodes, noting limited sensitivity for the detection of hilar adenopathy on this noncontrast study.  Lungs/Pleura: No pneumothorax. No evidence of calcified pleural plaques. No pleural effusion. Moderate centrilobular emphysema with diffuse bronchial wall thickening. No acute consolidative airspace disease, lung masses or significant pulmonary nodules. There is basilar predominant subpleural reticulation and ground-glass attenuation in both lungs with associated mild traction bronchiolectasis. No frank honeycombing. No significant lobular air trapping on the expiration sequence.  Upper abdomen: Cholecystectomy.  Musculoskeletal: No aggressive appearing focal osseous lesions. Moderate thoracic spondylosis. Nonspecific T5 vertebral sclerotic lesion, probably a benign bone island.  IMPRESSION: 1. No calcified pleural plaques or pleural effusions. 2.  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 Follow-up high-resolution chest CT in 12 months recommended to assess temporal pattern stability. 3. Aortic atherosclerosis. Ectatic 4.3 cm ascending thoracic aorta. 4. Left main and 3 vessel coronary atherosclerosis.     Assessment & Plan:  COPD, severe (HCC) Difficult history giver. He had an apparent AE 1 month ago, did appear to improved from pred + doxy. He also took his lasix at the time, ? how much impact this had. He has decreased symbicort to qd because he isn';t sur ethat it helps him. He is on DuoNeb so the symbicort may be redundant. I will stoip it, continue the duoneb on a schedule. Continue albuterol prn. He doesn';t want to use o2 during the day. Unclear that he needs it. I will defer walking oximetry for now.   ILD (interstitial lung disease) (HCC) Mild basilar ILD changes, no pleural disease. Need to determine timing of repeat CT vs CXR at his next OV  Nocturnal hypoxia Continue O2 as ordered  Levy Pupa, MD, PhD 03/16/2018, 3:19 PM [facility] [phone] or if no answer [phone]

## 2018-03-16 NOTE — Patient Instructions (Addendum)
Please continue your DuoNeb 3 times a day.  Stop Symbicort Keep albuterol (Ventolin) available to use 2 puffs up to every 4 hours IF NEEDED for shortness of breath, wheezing, or chest tightness.  Continue your heart medications as directed by your Cardiologist. Agree with seeing Cardiology this year, may need to adjust your medications including your furosemide (lasix).  Follow with Dr Delton Coombes in 6 months or sooner if you have any problems

## 2018-03-16 NOTE — Assessment & Plan Note (Signed)
Mild basilar ILD changes, no pleural disease. Need to determine timing of repeat CT vs CXR at his next OV

## 2018-03-24 ENCOUNTER — Encounter: Payer: Self-pay | Admitting: Internal Medicine

## 2018-03-24 ENCOUNTER — Ambulatory Visit (INDEPENDENT_AMBULATORY_CARE_PROVIDER_SITE_OTHER): Payer: Medicare Other | Admitting: Internal Medicine

## 2018-03-24 VITALS — BP 126/82 | HR 48 | Temp 98.0°F | Ht 70.0 in | Wt 202.0 lb

## 2018-03-24 DIAGNOSIS — F329 Major depressive disorder, single episode, unspecified: Secondary | ICD-10-CM | POA: Diagnosis not present

## 2018-03-24 DIAGNOSIS — J849 Interstitial pulmonary disease, unspecified: Secondary | ICD-10-CM

## 2018-03-24 DIAGNOSIS — I10 Essential (primary) hypertension: Secondary | ICD-10-CM | POA: Diagnosis not present

## 2018-03-24 DIAGNOSIS — I2510 Atherosclerotic heart disease of native coronary artery without angina pectoris: Secondary | ICD-10-CM | POA: Diagnosis not present

## 2018-03-24 DIAGNOSIS — I5042 Chronic combined systolic (congestive) and diastolic (congestive) heart failure: Secondary | ICD-10-CM | POA: Diagnosis not present

## 2018-03-24 DIAGNOSIS — F32A Depression, unspecified: Secondary | ICD-10-CM

## 2018-03-24 DIAGNOSIS — J9611 Chronic respiratory failure with hypoxia: Secondary | ICD-10-CM | POA: Diagnosis not present

## 2018-03-24 DIAGNOSIS — R600 Localized edema: Secondary | ICD-10-CM

## 2018-03-24 DIAGNOSIS — R0600 Dyspnea, unspecified: Secondary | ICD-10-CM

## 2018-03-24 MED ORDER — PREDNISONE 10 MG PO TABS: ORAL_TABLET | ORAL | 3 refills | Status: DC

## 2018-03-24 NOTE — Assessment & Plan Note (Signed)
Doing well - very busy

## 2018-03-24 NOTE — Assessment & Plan Note (Signed)
Better on O2 

## 2018-03-24 NOTE — Assessment & Plan Note (Signed)
Resolved

## 2018-03-24 NOTE — Assessment & Plan Note (Signed)
An option to take Deltasone daily was discussed - Rx given  Potential benefits of a long term steroid  use as well as potential risks  and complications were explained to the patient and were aknowledged.

## 2018-03-24 NOTE — Assessment & Plan Note (Signed)
4/19 Steroids helped. An option to take Deltasone daily was discussed - Rx given  Potential benefits of a long term steroid  use as well as potential risks  and complications were explained to the patient and were aknowledged.

## 2018-03-24 NOTE — Assessment & Plan Note (Signed)
Losartan Coreg 

## 2018-03-24 NOTE — Assessment & Plan Note (Signed)
Losartan, Lipitor, Plavix, ASA, Coreg 

## 2018-03-24 NOTE — Progress Notes (Signed)
Subjective:  Patient ID: Jon Baker, male    DOB: 11/02/1932  Age: 84 y.o. MRN: [id number]  CC: No chief complaint on file.   HPI Natnael Eppinger presents for PF on O2, DOE, HTN f/u. Using HHN 3/d. Feeling much better after po abx and prednisone. Very active: moving sheds, cutting trees, weeds...  Outpatient Medications Prior to Visit  Medication Sig Dispense Refill  . albuterol (PROVENTIL HFA;VENTOLIN HFA) 108 (90 Base) MCG/ACT inhaler Inhale 2 puffs into the lungs every 6 (six) hours as needed for wheezing or shortness of breath. 1 Inhaler 5  . aspirin EC 81 MG tablet Take 1 tablet (81 mg total) by mouth daily. 100 tablet 3  . atorvastatin (LIPITOR) 80 MG tablet TAKE 1 TABLET (80 MG TOTAL) BY MOUTH DAILY AT 6 PM. 30 tablet 11  . budesonide-formoterol (SYMBICORT) 160-4.5 MCG/ACT inhaler Inhale 2 puffs into the lungs 2 (two) times daily. 1 Inhaler 11  . carvedilol (COREG) 25 MG tablet TAKE 1 TABLET (25 MG TOTAL) BY MOUTH 2 (TWO) TIMES DAILY WITH A MEAL. 60 tablet 10  . cholecalciferol (VITAMIN D) 1000 UNITS tablet Take 1,000 Units by mouth daily.    . clopidogrel (PLAVIX) 75 MG tablet Take 1 tablet (75 mg total) by mouth daily. 90 tablet 2  . furosemide (LASIX) 40 MG tablet Take 1 tablet (40 mg total) by mouth daily as needed. 30 tablet 11  . hydrochlorothiazide (MICROZIDE) 12.5 MG capsule TAKE 1 CAPSULE (12.5 MG TOTAL) BY MOUTH DAILY. 30 capsule 0  . ipratropium-albuterol (DUONEB) 0.5-2.5 (3) MG/3ML SOLN Take 3 mLs by nebulization every 6 (six) hours as needed. 360 mL 3  . losartan (COZAAR) 100 MG tablet TAKE 1 TABLET BY MOUTH EVERY DAY 30 tablet 11  . nitroGLYCERIN (NITROSTAT) 0.4 MG SL tablet Place 1 tablet (0.4 mg total) under the tongue every 5 (five) minutes as needed for chest pain. 25 tablet 3   Facility-Administered Medications Prior to Visit  Medication Dose Route Frequency Provider Last Rate Last Dose  . ipratropium-albuterol (DUONEB) 0.5-2.5 (3) MG/3ML nebulizer  solution 3 mL  3 mL Nebulization Q6H Saguier, Edward, PA-C        ROS Review of Systems  Constitutional: Positive for fatigue. Negative for appetite change and unexpected weight change.  HENT: Negative for congestion, nosebleeds, sneezing, sore throat and trouble swallowing.   Eyes: Negative for itching and visual disturbance.  Respiratory: Positive for shortness of breath. Negative for cough.   Cardiovascular: Negative for chest pain, palpitations and leg swelling.  Gastrointestinal: Negative for abdominal distention, blood in stool, diarrhea and nausea.  Genitourinary: Negative for frequency and hematuria.  Musculoskeletal: Positive for arthralgias, back pain, neck pain and neck stiffness. Negative for gait problem and joint swelling.  Skin: Negative for rash.  Neurological: Negative for dizziness, tremors, speech difficulty and weakness.  Psychiatric/Behavioral: Negative for agitation, dysphoric mood, sleep disturbance and suicidal ideas. The patient is not nervous/anxious.     Objective:  BP 126/82 (BP Location: Left Arm, Patient Position: Sitting, Cuff Size: Large)   Pulse (!) 48   Temp 98 F (36.7 C) (Oral)   Ht 5\' 10"  (1.778 m)   Wt 202 lb (91.6 kg)   SpO2 97%   BMI 28.98 kg/m   BP Readings from Last 3 Encounters:  03/24/18 126/82  03/16/18 128/90  02/17/18 132/88    Wt Readings from Last 3 Encounters:  03/24/18 202 lb (91.6 kg)  03/16/18 202 lb (91.6 kg)  02/17/18 208 lb 1.9 oz (  94.4 kg)    Physical Exam  Constitutional: He is oriented to person, place, and time. He appears well-developed. No distress.  NAD  HENT:  Mouth/Throat: Oropharynx is clear and moist.  Eyes: Pupils are equal, round, and reactive to light. Conjunctivae are normal.  Neck: Normal range of motion. No JVD present. No thyromegaly present.  Cardiovascular: Normal rate, regular rhythm, normal heart sounds and intact distal pulses. Exam reveals no gallop and no friction rub.  No murmur  heard. Pulmonary/Chest: He is in respiratory distress. He has no wheezes. He has rales. He exhibits no tenderness.  Abdominal: Soft. Bowel sounds are normal. He exhibits no distension and no mass. There is no tenderness. There is no rebound and no guarding.  Musculoskeletal: Normal range of motion. He exhibits no edema or tenderness.  Lymphadenopathy:    He has no cervical adenopathy.  Neurological: He is alert and oriented to person, place, and time. He has normal reflexes. No cranial nerve deficit. He exhibits normal muscle tone. He displays a negative Romberg sign. Coordination and gait normal.  Skin: Skin is warm and dry. No rash noted.  Psychiatric: He has a normal mood and affect. His behavior is normal. Judgment and thought content normal.    Lab Results  Component Value Date   WBC 11.5 (H) 02/17/2018   HGB 14.2 02/17/2018   HCT 40.2 02/17/2018   PLT 286.0 02/17/2018   GLUCOSE 103 (H) 02/17/2018   CHOL 125 07/20/2015   TRIG 136.0 07/20/2015   HDL 25.40 (L) 07/20/2015   LDLCALC 72 07/20/2015   ALT 32 08/01/2015   AST 27 08/01/2015   NA 138 02/17/2018   K 3.3 (L) 02/17/2018   CL 95 (L) 02/17/2018   CREATININE 0.90 02/17/2018   BUN 13 02/17/2018   CO2 39 (H) 02/17/2018   TSH 2.04 03/31/2015   PSA 4.51 (H) 03/31/2015   INR 1.10 05/20/2015   HGBA1C 5.6 05/22/2015    Dg Chest 2 View  Result Date: 02/17/2018 CLINICAL DATA:  84 year old male with cough for 6 months. Shortness for breath for the past week. Initial encounter. EXAM: CHEST - 2 VIEW COMPARISON:  04/11/2017 CT.  01/20/2017 chest x-ray. FINDINGS: Chronic lung changes without infiltrate or congestive heart failure. Nodular density anterior aspect right sixth rib may represent nipple shadow. Nipple marker view can be obtained to exclude underlying lung nodule. Calcified markedly tortuous aorta relatively similar to prior exam. Heart size top-normal. No acute osseous abnormality noted. IMPRESSION: Chronic lung changes  without infiltrate or congestive heart failure. Nodular density anterior aspect right sixth rib may represent nipple shadow. Nipple marker view can be obtained to exclude underlying lung nodule. Aortic Atherosclerosis (ICD10-I70.0). Calcified markedly tortuous aorta relatively similar to prior exam. Please see prior CT report which included recommendations for follow-up high-resolution chest CT. Electronically Signed   By: Lacy Duverney M.D.   On: 02/17/2018 15:32    Assessment & Plan:   There are no diagnoses linked to this encounter. I am having [name] maintain his aspirin EC, cholecalciferol, albuterol, nitroGLYCERIN, ipratropium-albuterol, hydrochlorothiazide, atorvastatin, losartan, furosemide, carvedilol, budesonide-formoterol, and clopidogrel. We will continue to administer ipratropium-albuterol.  No orders of the defined types were placed in this encounter.    Follow-up: No follow-ups on file.  Sonda Primes, MD

## 2018-04-03 ENCOUNTER — Other Ambulatory Visit: Payer: Self-pay | Admitting: Internal Medicine

## 2018-05-12 ENCOUNTER — Ambulatory Visit (INDEPENDENT_AMBULATORY_CARE_PROVIDER_SITE_OTHER): Payer: Medicare Other | Admitting: Cardiovascular Disease

## 2018-05-12 ENCOUNTER — Encounter: Payer: Self-pay | Admitting: Cardiovascular Disease

## 2018-05-12 VITALS — BP 146/86 | HR 47 | Wt 199.0 lb

## 2018-05-12 DIAGNOSIS — E782 Mixed hyperlipidemia: Secondary | ICD-10-CM | POA: Diagnosis not present

## 2018-05-12 DIAGNOSIS — I2510 Atherosclerotic heart disease of native coronary artery without angina pectoris: Secondary | ICD-10-CM

## 2018-05-12 DIAGNOSIS — I2102 ST elevation (STEMI) myocardial infarction involving left anterior descending coronary artery: Secondary | ICD-10-CM

## 2018-05-12 DIAGNOSIS — I10 Essential (primary) hypertension: Secondary | ICD-10-CM

## 2018-05-12 NOTE — Assessment & Plan Note (Signed)
History of essential hypertension blood pressure measured at 146/86 he is on hydrochlorothiazide and carvedilol.  Continue current meds at current dosing.Jon Baker

## 2018-05-12 NOTE — Assessment & Plan Note (Signed)
History of CAD status post anterior STEMI 05/20/2015.  He was taken to the Cath Lab by Dr. Excell Seltzer identified a 99% proximal LAD lesion.  He underwent aspiration thrombectomy followed by drug-eluting stent stenting of his proximal LAD.  He had otherwise noncritical CAD with an anteroapical wall motion abnormality and an EF of 35 to 40%.  His EF ultimately improved to normal by 2D echo 08/29/2015.  He transition from Brilinta to Plavix because of shortness of breath and had a P2 Y 12 level of 176.  Currently denies chest pains or shortness of breath.

## 2018-05-12 NOTE — Assessment & Plan Note (Signed)
History of hyperlipidemia on statin therapy. We will check a lipid and liver profile 

## 2018-05-12 NOTE — Patient Instructions (Signed)

## 2018-05-12 NOTE — Progress Notes (Signed)
05/12/2018 Jon Baker   [birth year]  [id number]  Primary Physician [name], Georgina Quint, MD Primary Cardiologist: Runell Gess MD Nicholes Calamity, [place]  HPI:  Jon Baker is a 84 y.o.  married Caucasian male, retired Retail buyer, with a history of hypertension and dyslipidemia as well as COPD followed by Dr. [name] . I last saw him in the office 05/09/2017. He was seen by myself in [facility] emergency room on 05/20/15 with an EKG that showed anterior ST segment elevation myocardial infarction. He was taken to the cardiac catheterization laboratory by Dr. Excell Seltzer where he had a 99% proximal LAD lesion. Went aspiration thrombectomy and stenting using a drug-eluting stent. Otherwise he had noncritical CAD with an anteroapical wall motion and LV, and ejection fraction of 35-40%. His Brilenta was ultimately changed to Plavix because of symptoms of shortness of breath.. He does have COPD and dyspnea on exertion but denies chest pain.his P2Y12. Verify now platelet reactivity test was favorable at 176. A 2 -D echocardiogram performed 08/29/15 revealed improvement in his ejection fraction up to 60% without wall motion abnormalities. Since I saw him in November 2016 he is done well. He continues to complain of shortness of breath and has seen a pulmonologist who placed him on inhaled bronchodilators and continuous oxygen therapy.  Since I saw him a year ago he is remained stable.  He does have chronic shortness of breath but denies chest pain.     No outpatient medications have been marked as taking for the 05/12/18 encounter (Office Visit) with Runell Gess, MD.   Current Facility-Administered Medications for the 05/12/18 encounter (Office Visit) with Runell Gess, MD  Medication  . ipratropium-albuterol (DUONEB) 0.5-2.5 (3) MG/3ML nebulizer solution 3 mL     No Known Allergies  Social History   Socioeconomic History  . Marital status: Married    Spouse name:  Not on file  . Number of children: Not on file  . Years of education: Not on file  . Highest education level: Not on file  Occupational History  . Not on file  Social Needs  . Financial resource strain: Not on file  . Food insecurity:    Worry: Not on file    Inability: Not on file  . Transportation needs:    Medical: Not on file    Non-medical: Not on file  Tobacco Use  . Smoking status: Former Smoker    Packs/day: 2.00    Years: 49.00    Pack years: 98.00    Types: Cigarettes    Start date: 04/26/1939    Last attempt to quit: 12/10/1987    Years since quitting: 30.4  . Smokeless tobacco: Never Used  Substance and Sexual Activity  . Alcohol use: No  . Drug use: No  . Sexual activity: Not on file  Lifestyle  . Physical activity:    Days per week: Not on file    Minutes per session: Not on file  . Stress: Not on file  Relationships  . Social connections:    Talks on phone: Not on file    Gets together: Not on file    Attends religious service: Not on file    Active member of club or organization: Not on file    Attends meetings of clubs or organizations: Not on file    Relationship status: Not on file  . Intimate partner violence:    Fear of current or ex partner: Not on file  Emotionally abused: Not on file    Physically abused: Not on file    Forced sexual activity: Not on file  Other Topics Concern  . Not on file  Social History Narrative   [facility] (02/12/17):   Originally from [facility] 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 No pets currently. No bird or mold exposure. Doesn't have any hobbies currently. Previously enjoyed square dancing.      Review of Systems: General: negative for chills, fever, night sweats or weight changes.  Cardiovascular: negative for chest pain, dyspnea on exertion, edema, orthopnea, palpitations,  paroxysmal nocturnal dyspnea or shortness of breath Dermatological: negative for rash Respiratory: negative for cough or wheezing Urologic: negative for hematuria Abdominal: negative for nausea, vomiting, diarrhea, bright red blood per rectum, melena, or hematemesis Neurologic: negative for visual changes, syncope, or dizziness All other systems reviewed and are otherwise negative except as noted above.    Blood pressure (!) 146/86, pulse (!) 47, weight 199 lb (90.3 kg).  General appearance: alert and no distress Neck: no adenopathy, no carotid bruit, no JVD, supple, symmetrical, trachea midline and thyroid not enlarged, symmetric, no tenderness/mass/nodules Lungs: clear to auscultation bilaterally Heart: regular rate and rhythm, S1, S2 normal, no murmur, click, rub or gallop Extremities: extremities normal, atraumatic, no cyanosis or edema Pulses: 2+ and symmetric Skin: Skin color, texture, turgor normal. No rashes or lesions Neurologic: Alert and oriented X 3, normal strength and tone. Normal symmetric reflexes. Normal coordination and gait  EKG sinus bradycardia 47 with right bundle branch block.  I personally reviewed this EKG.  ASSESSMENT AND PLAN:   HYPERLIPIDEMIA History of hyperlipidemia on statin therapy.  We will check a lipid and liver profile.  Essential hypertension History of essential hypertension blood pressure measured at 146/86 he is on hydrochlorothiazide and carvedilol.  Continue current meds at current dosing.[name]  CAD (coronary artery disease) History of CAD status post anterior STEMI 05/20/2015.  He was taken to the Cath Lab by Dr. Excell Seltzer identified a 99% proximal LAD lesion.  He underwent aspiration thrombectomy followed by drug-eluting stent stenting of his proximal LAD.  He had otherwise noncritical CAD with an anteroapical wall motion abnormality and an EF of 35 to 40%.  His EF ultimately improved to normal by 2D echo 08/29/2015.  He transition from Brilinta to  Plavix because of shortness of breath and had a P2 Y 12 level of 176.  Currently denies chest pains or shortness of breath.      Runell Gess MD FACP,FACC,[name], FSCAI 05/12/2018 2:10 PM

## 2018-06-22 ENCOUNTER — Other Ambulatory Visit: Payer: Self-pay | Admitting: Cardiovascular Disease

## 2018-06-22 NOTE — Telephone Encounter (Signed)
Rx sent to pharmacy   

## 2018-06-24 ENCOUNTER — Encounter: Payer: Self-pay | Admitting: Internal Medicine

## 2018-06-24 ENCOUNTER — Ambulatory Visit (INDEPENDENT_AMBULATORY_CARE_PROVIDER_SITE_OTHER): Payer: Medicare Other | Admitting: Internal Medicine

## 2018-06-24 ENCOUNTER — Other Ambulatory Visit (INDEPENDENT_AMBULATORY_CARE_PROVIDER_SITE_OTHER): Payer: Medicare Other

## 2018-06-24 DIAGNOSIS — R5382 Chronic fatigue, unspecified: Secondary | ICD-10-CM | POA: Insufficient documentation

## 2018-06-24 DIAGNOSIS — I10 Essential (primary) hypertension: Secondary | ICD-10-CM

## 2018-06-24 DIAGNOSIS — I2510 Atherosclerotic heart disease of native coronary artery without angina pectoris: Secondary | ICD-10-CM

## 2018-06-24 DIAGNOSIS — J849 Interstitial pulmonary disease, unspecified: Secondary | ICD-10-CM

## 2018-06-24 DIAGNOSIS — J9611 Chronic respiratory failure with hypoxia: Secondary | ICD-10-CM | POA: Diagnosis not present

## 2018-06-24 LAB — BASIC METABOLIC PANEL
BUN: 20 mg/dL (ref 6–23)
CHLORIDE: 101 meq/L (ref 96–112)
CO2: 35 meq/L — AB (ref 19–32)
CREATININE: 1.07 mg/dL (ref 0.40–1.50)
Calcium: 9 mg/dL (ref 8.4–10.5)
GFR: 69.95 mL/min (ref >60.00)
Glucose, Bld: 105 mg/dL — ABNORMAL HIGH (ref 70–99)
POTASSIUM: 4.1 meq/L (ref 3.5–5.1)
Sodium: 141 mEq/L (ref 135–145)

## 2018-06-24 MED ORDER — PREDNISONE 10 MG PO TABS: ORAL_TABLET | ORAL | 3 refills | Status: DC

## 2018-06-24 NOTE — Assessment & Plan Note (Signed)
O2 prn Deltasone daiy - not taking for ?reason. Stopped for ?reason  Potential benefits of a long term steroid  use as well as potential risks  and complications were explained to the patient and were aknowledged.

## 2018-06-24 NOTE — Assessment & Plan Note (Signed)
O2

## 2018-06-24 NOTE — Assessment & Plan Note (Signed)
Coreg

## 2018-06-24 NOTE — Progress Notes (Signed)
Subjective:  Patient ID: Jon Baker, male    DOB: 01-Dec-1932  Age: 84 y.o. MRN: [id number]  CC: No chief complaint on file.   HPI Jon Baker presents for PF, OA, dyslipidemia f/u  Outpatient Medications Prior to Visit  Medication Sig Dispense Refill  . aspirin EC 81 MG tablet Take 1 tablet (81 mg total) by mouth daily. 100 tablet 3  . atorvastatin (LIPITOR) 80 MG tablet TAKE 1 TABLET BY MOUTH DAILY AT 6 PM 30 tablet 11  . budesonide-formoterol (SYMBICORT) 160-4.5 MCG/ACT inhaler Inhale 2 puffs into the lungs 2 (two) times daily. 1 Inhaler 11  . carvedilol (COREG) 25 MG tablet TAKE 1 TABLET (25 MG TOTAL) BY MOUTH 2 (TWO) TIMES DAILY WITH A MEAL. 60 tablet 10  . cholecalciferol (VITAMIN D) 1000 UNITS tablet Take 1,000 Units by mouth daily.    . clopidogrel (PLAVIX) 75 MG tablet Take 1 tablet (75 mg total) by mouth daily. 90 tablet 2  . furosemide (LASIX) 40 MG tablet Take 1 tablet (40 mg total) by mouth daily as needed. 30 tablet 11  . hydrochlorothiazide (MICROZIDE) 12.5 MG capsule TAKE 1 CAPSULE (12.5 MG TOTAL) BY MOUTH DAILY. 30 capsule 0  . ipratropium-albuterol (DUONEB) 0.5-2.5 (3) MG/3ML SOLN Take 3 mLs by nebulization every 6 (six) hours as needed. 360 mL 3  . losartan (COZAAR) 100 MG tablet TAKE 1 TABLET BY MOUTH EVERY DAY 30 tablet 11  . nitroGLYCERIN (NITROSTAT) 0.4 MG SL tablet Place 1 tablet (0.4 mg total) under the tongue every 5 (five) minutes as needed for chest pain. 25 tablet 3  . predniSONE (DELTASONE) 10 MG tablet 1 po qam pc 30 tablet 3  . VENTOLIN HFA 108 (90 Base) MCG/ACT inhaler INHALE 2 PUFFS INTO THE LUNGS EVERY 6 HOURS AS NEEDED FOR WHEEZING/SHORTNESS OF BREATH 18 Inhaler 2   Facility-Administered Medications Prior to Visit  Medication Dose Route Frequency Provider Last Rate Last Dose  . ipratropium-albuterol (DUONEB) 0.5-2.5 (3) MG/3ML nebulizer solution 3 mL  3 mL Nebulization Q6H Saguier, Edward, PA-C        ROS: Review of Systems    Constitutional: Negative for appetite change, fatigue and unexpected weight change.  HENT: Negative for congestion, nosebleeds, sneezing, sore throat and trouble swallowing.   Eyes: Negative for itching and visual disturbance.  Respiratory: Positive for shortness of breath and wheezing. Negative for cough.   Cardiovascular: Negative for chest pain, palpitations and leg swelling.  Gastrointestinal: Negative for abdominal distention, blood in stool, diarrhea and nausea.  Genitourinary: Negative for frequency and hematuria.  Musculoskeletal: Positive for arthralgias. Negative for back pain, gait problem, joint swelling and neck pain.  Skin: Negative for rash.  Neurological: Negative for dizziness, tremors, speech difficulty and weakness.  Psychiatric/Behavioral: Negative for agitation, dysphoric mood, sleep disturbance and suicidal ideas. The patient is not nervous/anxious.     Objective:  BP 132/78 (BP Location: Left Arm, Patient Position: Sitting, Cuff Size: Large)   Pulse (!) 50   Temp 98.1 F (36.7 C) (Oral)   Ht 5\' 10"  (1.778 m)   Wt 199 lb (90.3 kg)   SpO2 94%   BMI 28.55 kg/m   BP Readings from Last 3 Encounters:  06/24/18 132/78  05/12/18 (!) 146/86  03/24/18 126/82    Wt Readings from Last 3 Encounters:  06/24/18 199 lb (90.3 kg)  05/12/18 199 lb (90.3 kg)  03/24/18 202 lb (91.6 kg)    Physical Exam  Constitutional: He is oriented to person, place, and time.  He appears well-developed. No distress.  NAD  HENT:  Mouth/Throat: Oropharynx is clear and moist.  Eyes: Pupils are equal, round, and reactive to light. Conjunctivae are normal.  Neck: Normal range of motion. No JVD present. No thyromegaly present.  Cardiovascular: Normal rate, regular rhythm, normal heart sounds and intact distal pulses. Exam reveals no gallop and no friction rub.  No murmur heard. Pulmonary/Chest: Effort normal and breath sounds normal. No respiratory distress. He has no wheezes. He has no  rales. He exhibits no tenderness.  Abdominal: Soft. Bowel sounds are normal. He exhibits no distension and no mass. There is no tenderness. There is no rebound and no guarding.  Musculoskeletal: Normal range of motion. He exhibits no edema or tenderness.  Lymphadenopathy:    He has no cervical adenopathy.  Neurological: He is alert and oriented to person, place, and time. He has normal reflexes. No cranial nerve deficit. He exhibits normal muscle tone. He displays a negative Romberg sign. Coordination and gait normal.  Skin: Skin is warm and dry. No rash noted.  Psychiatric: He has a normal mood and affect. His behavior is normal. Judgment and thought content normal.    Lab Results  Component Value Date   WBC 11.5 (H) 02/17/2018   HGB 14.2 02/17/2018   HCT 40.2 02/17/2018   PLT 286.0 02/17/2018   GLUCOSE 103 (H) 02/17/2018   CHOL 125 07/20/2015   TRIG 136.0 07/20/2015   HDL 25.40 (L) 07/20/2015   LDLCALC 72 07/20/2015   ALT 32 08/01/2015   AST 27 08/01/2015   NA 138 02/17/2018   K 3.3 (L) 02/17/2018   CL 95 (L) 02/17/2018   CREATININE 0.90 02/17/2018   BUN 13 02/17/2018   CO2 39 (H) 02/17/2018   TSH 2.04 03/31/2015   PSA 4.51 (H) 03/31/2015   INR 1.10 05/20/2015   HGBA1C 5.6 05/22/2015    Dg Chest 2 View  Result Date: 02/17/2018 CLINICAL DATA:  84 year old male with cough for 6 months. Shortness for breath for the past week. Initial encounter. EXAM: CHEST - 2 VIEW COMPARISON:  04/11/2017 CT.  01/20/2017 chest x-ray. FINDINGS: Chronic lung changes without infiltrate or congestive heart failure. Nodular density anterior aspect right sixth rib may represent nipple shadow. Nipple marker view can be obtained to exclude underlying lung nodule. Calcified markedly tortuous aorta relatively similar to prior exam. Heart size top-normal. No acute osseous abnormality noted. IMPRESSION: Chronic lung changes without infiltrate or congestive heart failure. Nodular density anterior aspect right  sixth rib may represent nipple shadow. Nipple marker view can be obtained to exclude underlying lung nodule. Aortic Atherosclerosis (ICD10-I70.0). Calcified markedly tortuous aorta relatively similar to prior exam. Please see prior CT report which included recommendations for follow-up high-resolution chest CT. Electronically Signed   By: Lacy Duverney M.D.   On: 02/17/2018 15:32    Assessment & Plan:   There are no diagnoses linked to this encounter.   No orders of the defined types were placed in this encounter.    Follow-up: No follow-ups on file.  Sonda Primes, MD

## 2018-06-24 NOTE — Assessment & Plan Note (Signed)
BMET 

## 2018-06-24 NOTE — Patient Instructions (Addendum)
Senakot S or Fleet laxative daily

## 2018-06-24 NOTE — Assessment & Plan Note (Signed)
Losartan, Lipitor, Plavix, ASA, Coreg 

## 2018-09-11 ENCOUNTER — Encounter: Payer: Self-pay | Admitting: Emergency Medicine

## 2018-09-11 ENCOUNTER — Ambulatory Visit (INDEPENDENT_AMBULATORY_CARE_PROVIDER_SITE_OTHER): Payer: Medicare Other | Admitting: Emergency Medicine

## 2018-09-11 DIAGNOSIS — J849 Interstitial pulmonary disease, unspecified: Secondary | ICD-10-CM

## 2018-09-11 DIAGNOSIS — G4734 Idiopathic sleep related nonobstructive alveolar hypoventilation: Secondary | ICD-10-CM | POA: Diagnosis not present

## 2018-09-11 DIAGNOSIS — I2510 Atherosclerotic heart disease of native coronary artery without angina pectoris: Secondary | ICD-10-CM

## 2018-09-11 DIAGNOSIS — J449 Chronic obstructive pulmonary disease, unspecified: Secondary | ICD-10-CM

## 2018-09-11 NOTE — Assessment & Plan Note (Signed)
We will follow his chest x-ray and his clinical status.  Chest x-ray at his next visit.

## 2018-09-11 NOTE — Progress Notes (Signed)
Subjective:    Patient ID: Jon Baker, male    DOB: [birth year], 84 y.o.   MRN: [id number]  HPI 84 year old former smoker (98 pack years) with asbestos exposure. He has a history of coronary disease, atrial stenosis,  hypertension, ischemic cardiomyopathy.  He has been followed here by Dr. Jamison Neighbor for COPD, interstitial lung disease (without pleural manifestations), nocturnal hypoxemia on 3L/min. No longer uses O2 w exertion.  He has been treated with symbicort, decreased since last time to qd. He uses DuoNeb tid on a schedule.  He was treated with doxy + pred beginning of March for a suspected AE-COPD. Was experiencing cough and purulent sputum. He had low energy > has been better since he was treated. He has furosemide on his med list prn, hasn't used for a few weeks.   ROV 09/11/18 --this is a follow-up visit for patient with a history of COPD, asbestos exposure and basilar interstitial changes on CT scan of the chest without pleural disease.  He also has coronary disease hypertension, atrial stenosis, ischemic cardiomyopathy. He reports that his exertional SOB is unchanged. He tells me that he is "waiting to die". He never stopped symbicort, never increased his duoneb to qid. He is taking both bid. He uses albuterol prn - about every other day.    Review of Systems  Past Medical History:  Diagnosis Date  . Aortic stenosis    a. Mild by echo 08/2015.   [name] CAD (coronary artery disease)    a. STEMI 05/2015 s/p DES to LAD.  [name] Claudication [facility])    a. 2009, saw  cardiology, declined ABIs.  [name] COPD (chronic obstructive pulmonary disease) (HCC)    states he has been told by MD he has copd  . Depression   . DJD (degenerative joint disease)   . Dyslipidemia 2016  . HTN (hypertension)   . Ischemic cardiomyopathy    a. 05/2015: EF 35% at cath, 35-40% by echo. b. EF improved to 55-60% by echo 08/2015.  [name] Pancreatitis, gallstone 05/2013  . RBBB   . STEMI (ST elevation myocardial infarction) (HCC)  05/20/15   3.0 x 28 mm Promus premier DES to the LAD     Family History  Problem Relation Age of Onset  . Pancreatic cancer Mother   . Coronary artery disease Mother   . Coronary artery disease Father        several MI'S  . Breast cancer Sister   . Diabetes Neg Hx   . Lung disease Neg Hx      Social History   Socioeconomic History  . Marital status: Married    Spouse name: Not on file  . Number of children: Not on file  . Years of education: Not on file  . Highest education level: Not on file  Occupational History  . Not on file  Social Needs  . Financial resource strain: Not on file  . Food insecurity:    Worry: Not on file    Inability: Not on file  . Transportation needs:    Medical: Not on file    Non-medical: Not on file  Tobacco Use  . Smoking status: Former Smoker    Packs/day: 2.00    Years: 49.00    Pack years: 98.00    Types: Cigarettes    Start date: 04/26/1939    Last attempt to quit: 12/10/1987    Years since quitting: 30.7  . Smokeless tobacco: Never Used  Substance and Sexual Activity  . Alcohol  use: No  . Drug use: No  . Sexual activity: Not on file  Lifestyle  . Physical activity:    Days per week: Not on file    Minutes per session: Not on file  . Stress: Not on file  Relationships  . Social connections:    Talks on phone: Not on file    Gets together: Not on file    Attends religious service: Not on file    Active member of club or organization: Not on file    Attends meetings of clubs or organizations: Not on file    Relationship status: Not on file  . Intimate partner violence:    Fear of current or ex partner: Not on file    Emotionally abused: Not on file    Physically abused: Not on file    Forced sexual activity: Not on file  Other Topics Concern  . Not on file  Social History Narrative   [facility] (02/12/17):   Originally from [facility]. 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 No pets currently. No bird or mold exposure. Doesn't have any hobbies currently. Previously enjoyed square dancing.   He worked on Medical illustrator as a Curator, asbestos shingles roofing, Network engineer.  [place] native.   No Known Allergies   Outpatient Medications Prior to Visit  Medication Sig Dispense Refill  . aspirin EC 81 MG tablet Take 1 tablet (81 mg total) by mouth daily. 100 tablet 3  . atorvastatin (LIPITOR) 80 MG tablet TAKE 1 TABLET BY MOUTH DAILY AT 6 PM 30 tablet 11  . budesonide-formoterol (SYMBICORT) 160-4.5 MCG/ACT inhaler Inhale 2 puffs into the lungs 2 (two) times daily. 1 Inhaler 11  . carvedilol (COREG) 25 MG tablet TAKE 1 TABLET (25 MG TOTAL) BY MOUTH 2 (TWO) TIMES DAILY WITH A MEAL. 60 tablet 10  . cholecalciferol (VITAMIN D) 1000 UNITS tablet Take 1,000 Units by mouth daily.    . clopidogrel (PLAVIX) 75 MG tablet Take 1 tablet (75 mg total) by mouth daily. 90 tablet 2  . furosemide (LASIX) 40 MG tablet Take 1 tablet (40 mg total) by mouth daily as needed. 30 tablet 11  . guaiFENesin (MUCINEX) 600 MG 12 hr tablet Take 600 mg by mouth daily.    . hydrochlorothiazide (MICROZIDE) 12.5 MG capsule TAKE 1 CAPSULE (12.5 MG TOTAL) BY MOUTH DAILY. 30 capsule 0  . ipratropium-albuterol (DUONEB) 0.5-2.5 (3) MG/3ML SOLN Take 3 mLs by nebulization every 6 (six) hours as needed. 360 mL 3  . losartan (COZAAR) 100 MG tablet TAKE 1 TABLET BY MOUTH EVERY DAY 30 tablet 11  . predniSONE (DELTASONE) 10 MG tablet 1 po qam pc 30 tablet 3  . VENTOLIN HFA 108 (90 Base) MCG/ACT inhaler INHALE 2 PUFFS INTO THE LUNGS EVERY 6 HOURS AS NEEDED FOR WHEEZING/SHORTNESS OF BREATH 18 Inhaler 2  . nitroGLYCERIN (NITROSTAT) 0.4 MG SL tablet Place 1 tablet (0.4 mg total) under the tongue every 5 (five) minutes as needed for chest pain. (Patient not taking: Reported on 09/11/2018) 25 tablet 3   Facility-Administered Medications Prior to Visit    Medication Dose Route Frequency Provider Last Rate Last Dose  . ipratropium-albuterol (DUONEB) 0.5-2.5 (3) MG/3ML nebulizer solution 3 mL  3 mL Nebulization Q6H Saguier, Ramon Dredge, PA-C             Objective:   Physical Exam  Vitals:   09/11/18 1532  BP: 124/70  Pulse: (!) 53  SpO2: 93%  Weight: 201 lb 6.4 oz (91.4 kg)  Height: 5\' 10"  (1.778 m)   Gen: Pleasant, elderly man, in no distress, somewhat stoic  ENT: No lesions,  mouth clear,  oropharynx clear, no postnasal drip  Neck: No JVD, no TMG, no carotid bruits  Lungs: No use of accessory muscles, no crackles or wheezes.   Cardiovascular: RRR, heart sounds normal, no murmur or gallops, no peripheral edema  Musculoskeletal: No deformities, no cyanosis or clubbing  Neuro: alert, non focal  Skin: Warm, no lesions or rashes    High res CT chest 04/11/17 -  COMPARISON:  01/20/2017 chest radiograph.  FINDINGS: Cardiovascular: Normal heart size. No significant pericardial fluid/thickening. Left main, left anterior descending, left circumflex and right coronary atherosclerosis. Atherosclerotic thoracic aorta with ectatic 4.3 cm ascending thoracic aorta. Normal caliber pulmonary arteries.  Mediastinum/Nodes: Dominant hypodense 1.7 cm left thyroid lobe nodule. Unremarkable esophagus. No pathologically enlarged axillary, mediastinal or gross hilar lymph nodes, noting limited sensitivity for the detection of hilar adenopathy on this noncontrast study.  Lungs/Pleura: No pneumothorax. No evidence of calcified pleural plaques. No pleural effusion. Moderate centrilobular emphysema with diffuse bronchial wall thickening. No acute consolidative airspace disease, lung masses or significant pulmonary nodules. There is basilar predominant subpleural reticulation and ground-glass attenuation in both lungs with associated mild traction bronchiolectasis. No frank honeycombing. No significant lobular air trapping on the expiration  sequence.  Upper abdomen: Cholecystectomy.  Musculoskeletal: No aggressive appearing focal osseous lesions. Moderate thoracic spondylosis. Nonspecific T5 vertebral sclerotic lesion, probably a benign bone island.  IMPRESSION: 1. No calcified pleural plaques or pleural effusions. 2. 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 Follow-up high-resolution chest CT in 12 months recommended to assess temporal pattern stability. 3. Aortic atherosclerosis. Ectatic 4.3 cm ascending thoracic aorta. 4. Left main and 3 vessel coronary atherosclerosis.     Assessment & Plan:  COPD, severe (HCC) Quite difficult to determine his symptoms, he is stoic and is a poor historian.  He never stopped the Symbicort per our prior discussion.  He wants to stay on the same regimen.  I will leave him on the Symbicort and the DuoNeb's twice a day.  He has albuterol that he can use as needed.  He stopped using oxygen with exertion.  He refused a flu shot  ILD (interstitial lung disease) (HCC) We will follow his chest x-ray and his clinical status.  Chest x-ray at his next visit.  Nocturnal hypoxia Continue his nocturnal oxygen as ordered  Levy Pupa, MD, PhD 09/11/2018, 4:04 PM [facility] [phone] or if no answer [phone]

## 2018-09-11 NOTE — Assessment & Plan Note (Signed)
Continue his nocturnal oxygen as ordered

## 2018-09-11 NOTE — Assessment & Plan Note (Addendum)
Quite difficult to determine his symptoms, he is stoic and is a poor historian.  He never stopped the Symbicort per our prior discussion.  He wants to stay on the same regimen.  I will leave him on the Symbicort and the DuoNeb's twice a day.  He has albuterol that he can use as needed.  He stopped using oxygen with exertion.  He refused a flu shot

## 2018-09-11 NOTE — Patient Instructions (Addendum)
We will plan to repeat your CXR next visit.  Please continue your Symbicort 2 puffs twice a day.  Remember to rinse and gargle after using. Use your DuoNeb twice a day. Keep your albuterol available to use 2 puffs up to every 4 hours if needed for shortness of breath You would benefit from the flu shot.  Please let us know if you change your mind about getting this. Continue your oxygen at night the way you have been using it. Follow with Dr Delton Coombes in 12 months or sooner if you have any problems

## 2018-10-24 ENCOUNTER — Other Ambulatory Visit: Payer: Self-pay | Admitting: Internal Medicine

## 2018-10-27 ENCOUNTER — Other Ambulatory Visit: Payer: Self-pay

## 2018-10-29 ENCOUNTER — Encounter: Payer: Self-pay | Admitting: Internal Medicine

## 2018-10-29 ENCOUNTER — Ambulatory Visit (INDEPENDENT_AMBULATORY_CARE_PROVIDER_SITE_OTHER): Payer: Medicare Other | Admitting: Internal Medicine

## 2018-10-29 DIAGNOSIS — I2510 Atherosclerotic heart disease of native coronary artery without angina pectoris: Secondary | ICD-10-CM

## 2018-10-29 DIAGNOSIS — I255 Ischemic cardiomyopathy: Secondary | ICD-10-CM | POA: Diagnosis not present

## 2018-10-29 DIAGNOSIS — I5042 Chronic combined systolic (congestive) and diastolic (congestive) heart failure: Secondary | ICD-10-CM

## 2018-10-29 MED ORDER — LOSARTAN POTASSIUM 100 MG PO TABS: 100.0000 mg | ORAL_TABLET | Freq: Every day | ORAL | 0 refills | Status: DC

## 2018-10-29 MED ORDER — CARVEDILOL 25 MG PO TABS: 12.5000 mg | ORAL_TABLET | Freq: Two times a day (BID) | ORAL | 11 refills | Status: DC

## 2018-10-29 MED ORDER — CARVEDILOL 25 MG PO TABS: 25.0000 mg | ORAL_TABLET | Freq: Two times a day (BID) | ORAL | 10 refills | Status: DC

## 2018-10-29 NOTE — Assessment & Plan Note (Addendum)
No angina Losartan, Lipitor, Plavix, ASA, Coreg

## 2018-10-29 NOTE — Progress Notes (Signed)
Subjective:  Patient ID: Jon Baker, male    DOB: December 15, 1931  Age: 84 y.o. MRN: [id number]  CC: No chief complaint on file.   HPI [name] presents for PF, CAD, CHF, SOB f/u I feel "drunk" all the time. He has been grinding leaves on the riding mower, taking trash out, grocery shopping On Prednisone now. Using O2 Refused labs  Outpatient Medications Prior to Visit  Medication Sig Dispense Refill  . aspirin EC 81 MG tablet Take 1 tablet (81 mg total) by mouth daily. 100 tablet 3  . atorvastatin (LIPITOR) 80 MG tablet TAKE 1 TABLET BY MOUTH DAILY AT 6 PM 30 tablet 11  . budesonide-formoterol (SYMBICORT) 160-4.5 MCG/ACT inhaler Inhale 2 puffs into the lungs 2 (two) times daily. 1 Inhaler 11  . carvedilol (COREG) 25 MG tablet TAKE 1 TABLET (25 MG TOTAL) BY MOUTH 2 (TWO) TIMES DAILY WITH A MEAL. 60 tablet 10  . cholecalciferol (VITAMIN D) 1000 UNITS tablet Take 1,000 Units by mouth daily.    . clopidogrel (PLAVIX) 75 MG tablet Take 1 tablet (75 mg total) by mouth daily. 90 tablet 2  . furosemide (LASIX) 40 MG tablet Take 1 tablet (40 mg total) by mouth daily as needed. 30 tablet 11  . guaiFENesin (MUCINEX) 600 MG 12 hr tablet Take 600 mg by mouth daily.    . hydrochlorothiazide (MICROZIDE) 12.5 MG capsule TAKE 1 CAPSULE (12.5 MG TOTAL) BY MOUTH DAILY. 30 capsule 0  . ipratropium-albuterol (DUONEB) 0.5-2.5 (3) MG/3ML SOLN Take 3 mLs by nebulization every 6 (six) hours as needed. 360 mL 3  . losartan (COZAAR) 100 MG tablet Take 1 tablet (100 mg total) by mouth daily. Follow-up appt is due must see provider for refills 30 tablet 0  . nitroGLYCERIN (NITROSTAT) 0.4 MG SL tablet Place 1 tablet (0.4 mg total) under the tongue every 5 (five) minutes as needed for chest pain. 25 tablet 3  . predniSONE (DELTASONE) 10 MG tablet 1 po qam pc 30 tablet 3  . VENTOLIN HFA 108 (90 Base) MCG/ACT inhaler INHALE 2 PUFFS INTO THE LUNGS EVERY 6 HOURS AS NEEDED FOR WHEEZING/SHORTNESS OF BREATH 18  Inhaler 2   Facility-Administered Medications Prior to Visit  Medication Dose Route Frequency Provider Last Rate Last Dose  . ipratropium-albuterol (DUONEB) 0.5-2.5 (3) MG/3ML nebulizer solution 3 mL  3 mL Nebulization Q6H Saguier, Edward, PA-C        ROS: Review of Systems  Constitutional: Positive for fatigue. Negative for appetite change and unexpected weight change.  HENT: Negative for congestion, nosebleeds, sneezing, sore throat and trouble swallowing.   Eyes: Negative for itching and visual disturbance.  Respiratory: Positive for shortness of breath. Negative for cough.   Cardiovascular: Negative for chest pain, palpitations and leg swelling.  Gastrointestinal: Negative for abdominal distention, blood in stool, diarrhea and nausea.  Genitourinary: Negative for frequency and hematuria.  Musculoskeletal: Negative for back pain, gait problem, joint swelling and neck pain.  Skin: Negative for rash.  Neurological: Positive for dizziness, weakness and numbness. Negative for tremors and speech difficulty.  Psychiatric/Behavioral: Negative for agitation, dysphoric mood, sleep disturbance and suicidal ideas. The patient is not nervous/anxious.     Objective:  BP 122/76 (BP Location: Left Arm, Patient Position: Sitting, Cuff Size: Large)   Pulse (!) 49   Temp 98 F (36.7 C) (Oral)   Ht 5\' 10"  (1.778 m)   Wt 205 lb (93 kg)   SpO2 94%   BMI 29.41 kg/m   BP Readings  from Last 3 Encounters:  10/29/18 122/76  09/11/18 124/70  06/24/18 132/78    Wt Readings from Last 3 Encounters:  10/29/18 205 lb (93 kg)  09/11/18 201 lb 6.4 oz (91.4 kg)  06/24/18 199 lb (90.3 kg)    Physical Exam  Constitutional: He is oriented to person, place, and time. He appears well-developed. No distress.  NAD  HENT:  Mouth/Throat: Oropharynx is clear and moist.  Eyes: Pupils are equal, round, and reactive to light. Conjunctivae are normal.  Neck: Normal range of motion. No JVD present. No  thyromegaly present.  Cardiovascular: Normal rate, regular rhythm, normal heart sounds and intact distal pulses. Exam reveals no gallop and no friction rub.  No murmur heard. Pulmonary/Chest: Effort normal and breath sounds normal. No respiratory distress. He has no wheezes. He has no rales. He exhibits no tenderness.  Abdominal: Soft. Bowel sounds are normal. He exhibits no distension and no mass. There is no tenderness. There is no rebound and no guarding.  Musculoskeletal: Normal range of motion. He exhibits no edema or tenderness.  Lymphadenopathy:    He has no cervical adenopathy.  Neurological: He is alert and oriented to person, place, and time. He has normal reflexes. No cranial nerve deficit. He exhibits normal muscle tone. He displays a negative Romberg sign. Coordination and gait normal.  Skin: Skin is warm and dry. No rash noted.  Psychiatric: He has a normal mood and affect. His behavior is normal. Judgment and thought content normal.    Lab Results  Component Value Date   WBC 11.5 (H) 02/17/2018   HGB 14.2 02/17/2018   HCT 40.2 02/17/2018   PLT 286.0 02/17/2018   GLUCOSE 105 (H) 06/24/2018   CHOL 125 07/20/2015   TRIG 136.0 07/20/2015   HDL 25.40 (L) 07/20/2015   LDLCALC 72 07/20/2015   ALT 32 08/01/2015   AST 27 08/01/2015   NA 141 06/24/2018   K 4.1 06/24/2018   CL 101 06/24/2018   CREATININE 1.07 06/24/2018   BUN 20 06/24/2018   CO2 35 (H) 06/24/2018   TSH 2.04 03/31/2015   PSA 4.51 (H) 03/31/2015   INR 1.10 05/20/2015   HGBA1C 5.6 05/22/2015    Dg Chest 2 View  Result Date: 02/17/2018 CLINICAL DATA:  84 year old male with cough for 6 months. Shortness for breath for the past week. Initial encounter. EXAM: CHEST - 2 VIEW COMPARISON:  04/11/2017 CT.  01/20/2017 chest x-ray. FINDINGS: Chronic lung changes without infiltrate or congestive heart failure. Nodular density anterior aspect right sixth rib may represent nipple shadow. Nipple marker view can be obtained  to exclude underlying lung nodule. Calcified markedly tortuous aorta relatively similar to prior exam. Heart size top-normal. No acute osseous abnormality noted. IMPRESSION: Chronic lung changes without infiltrate or congestive heart failure. Nodular density anterior aspect right sixth rib may represent nipple shadow. Nipple marker view can be obtained to exclude underlying lung nodule. Aortic Atherosclerosis (ICD10-I70.0). Calcified markedly tortuous aorta relatively similar to prior exam. Please see prior CT report which included recommendations for follow-up high-resolution chest CT. Electronically Signed   By: Lacy Duverney M.D.   On: 02/17/2018 15:32    Assessment & Plan:   There are no diagnoses linked to this encounter.   No orders of the defined types were placed in this encounter.    Follow-up: No follow-ups on file.  Sonda Primes, MD

## 2018-10-29 NOTE — Assessment & Plan Note (Signed)
  Refused all shots, labs

## 2018-10-29 NOTE — Assessment & Plan Note (Addendum)
Discussed: We can try Entresto in place of losartan. His EF was 30 % before 2016 D/c Losartan Reduce Coreg to 12.5 mg bid  He would like not to change anything now  Refused all shots, labs

## 2018-11-22 ENCOUNTER — Other Ambulatory Visit: Payer: Self-pay | Admitting: Internal Medicine

## 2018-12-23 ENCOUNTER — Other Ambulatory Visit: Payer: Self-pay | Admitting: Cardiovascular Disease

## 2019-01-26 ENCOUNTER — Other Ambulatory Visit: Payer: Self-pay | Admitting: Internal Medicine

## 2019-02-18 ENCOUNTER — Other Ambulatory Visit: Payer: Self-pay | Admitting: Internal Medicine

## 2019-02-25 ENCOUNTER — Other Ambulatory Visit: Payer: Self-pay

## 2019-02-25 ENCOUNTER — Encounter: Payer: Self-pay | Admitting: Internal Medicine

## 2019-02-25 ENCOUNTER — Other Ambulatory Visit (INDEPENDENT_AMBULATORY_CARE_PROVIDER_SITE_OTHER): Payer: Medicare Other

## 2019-02-25 ENCOUNTER — Ambulatory Visit (INDEPENDENT_AMBULATORY_CARE_PROVIDER_SITE_OTHER): Payer: Medicare Other | Admitting: Internal Medicine

## 2019-02-25 ENCOUNTER — Other Ambulatory Visit: Payer: Self-pay | Admitting: Internal Medicine

## 2019-02-25 DIAGNOSIS — I2510 Atherosclerotic heart disease of native coronary artery without angina pectoris: Secondary | ICD-10-CM

## 2019-02-25 DIAGNOSIS — R600 Localized edema: Secondary | ICD-10-CM

## 2019-02-25 DIAGNOSIS — J849 Interstitial pulmonary disease, unspecified: Secondary | ICD-10-CM

## 2019-02-25 DIAGNOSIS — I10 Essential (primary) hypertension: Secondary | ICD-10-CM | POA: Diagnosis not present

## 2019-02-25 LAB — HEPATIC FUNCTION PANEL
ALT: 25 U/L (ref 0–53)
AST: 18 U/L (ref 0–37)
Albumin: 3.8 g/dL (ref 3.5–5.2)
Alkaline Phosphatase: 75 U/L (ref 39–117)
Bilirubin, Direct: 0.2 mg/dL (ref 0.0–0.3)
TOTAL PROTEIN: 6.5 g/dL (ref 6.0–8.3)
Total Bilirubin: 1 mg/dL (ref 0.2–1.2)

## 2019-02-25 LAB — BASIC METABOLIC PANEL
BUN: 18 mg/dL (ref 6–23)
CHLORIDE: 96 meq/L (ref 96–112)
CO2: 35 meq/L — AB (ref 19–32)
Calcium: 9.2 mg/dL (ref 8.4–10.5)
Creatinine, Ser: 0.93 mg/dL (ref 0.40–1.50)
GFR: 77.25 mL/min (ref >60.00)
Glucose, Bld: 111 mg/dL — ABNORMAL HIGH (ref 70–99)
POTASSIUM: 3.2 meq/L — AB (ref 3.5–5.1)
Sodium: 139 mEq/L (ref 135–145)

## 2019-02-25 LAB — TSH: TSH: 2.24 u[IU]/mL (ref 0.35–4.50)

## 2019-02-25 MED ORDER — POTASSIUM CHLORIDE CRYS ER 20 MEQ PO TBCR: 20.0000 meq | EXTENDED_RELEASE_TABLET | Freq: Every day | ORAL | 11 refills | Status: DC

## 2019-02-25 NOTE — Assessment & Plan Note (Signed)
Lasix O2 Elastic support socks

## 2019-02-25 NOTE — Assessment & Plan Note (Signed)
O2 Ventolin MDI

## 2019-02-25 NOTE — Assessment & Plan Note (Signed)
Coreg, Losartan

## 2019-02-25 NOTE — Assessment & Plan Note (Signed)
Losartan, Lipitor, Plavix, ASA, Coreg 

## 2019-02-25 NOTE — Patient Instructions (Signed)
Elastic support socks

## 2019-02-25 NOTE — Progress Notes (Signed)
Subjective:  Patient ID: Jon Baker, male    DOB: January 13, 1932  Age: 85 y.o. MRN: [id number]  CC: No chief complaint on file.   HPI Jon Baker presents for PF, resp failure on O2, CAD f/u  Outpatient Medications Prior to Visit  Medication Sig Dispense Refill  . aspirin EC 81 MG tablet Take 1 tablet (81 mg total) by mouth daily. 100 tablet 3  . atorvastatin (LIPITOR) 80 MG tablet TAKE 1 TABLET BY MOUTH DAILY AT 6 PM 30 tablet 11  . carvedilol (COREG) 25 MG tablet Take 1 tablet (25 mg total) by mouth 2 (two) times daily with a meal. 60 tablet 10  . carvedilol (COREG) 25 MG tablet TAKE 1 TABLET (25 MG TOTAL) BY MOUTH 2 (TWO) TIMES DAILY WITH A MEAL. 60 tablet 11  . cholecalciferol (VITAMIN D) 1000 UNITS tablet Take 1,000 Units by mouth daily.    . clopidogrel (PLAVIX) 75 MG tablet TAKE 1 TABLET BY MOUTH EVERY DAY 90 tablet 2  . guaiFENesin (MUCINEX) 600 MG 12 hr tablet Take 600 mg by mouth daily.    [name] ipratropium-albuterol (DUONEB) 0.5-2.5 (3) MG/3ML SOLN Take 3 mLs by nebulization every 6 (six) hours as needed. 360 mL 3  . losartan (COZAAR) 100 MG tablet Take 1 tablet (100 mg total) by mouth daily. 30 tablet 5  . nitroGLYCERIN (NITROSTAT) 0.4 MG SL tablet Place 1 tablet (0.4 mg total) under the tongue every 5 (five) minutes as needed for chest pain. 25 tablet 3  . predniSONE (DELTASONE) 10 MG tablet 1 po qam pc 30 tablet 3  . VENTOLIN HFA 108 (90 Base) MCG/ACT inhaler INHALE 2 PUFFS INTO THE LUNGS EVERY 6 HOURS AS NEEDED FOR WHEEZING/SHORTNESS OF BREATH 18 Inhaler 2  . furosemide (LASIX) 40 MG tablet Take 1 tablet (40 mg total) by mouth daily as needed. 30 tablet 11  . budesonide-formoterol (SYMBICORT) 160-4.5 MCG/ACT inhaler Inhale 2 puffs into the lungs 2 (two) times daily. (Patient not taking: Reported on 02/25/2019) 1 Inhaler 11  . carvedilol (COREG) 25 MG tablet Take 0.5 tablets (12.5 mg total) by mouth 2 (two) times daily with a meal. (Patient not taking: Reported on 02/25/2019)  60 tablet 11  . hydrochlorothiazide (MICROZIDE) 12.5 MG capsule TAKE 1 CAPSULE (12.5 MG TOTAL) BY MOUTH DAILY. (Patient not taking: Reported on 02/25/2019) 30 capsule 0   Facility-Administered Medications Prior to Visit  Medication Dose Route Frequency Provider Last Rate Last Dose  . ipratropium-albuterol (DUONEB) 0.5-2.5 (3) MG/3ML nebulizer solution 3 mL  3 mL Nebulization Q6H Saguier, Edward, PA-C        ROS: Review of Systems  Constitutional: Positive for fatigue. Negative for appetite change and unexpected weight change.  HENT: Negative for congestion, nosebleeds, sneezing, sore throat and trouble swallowing.   Eyes: Negative for itching and visual disturbance.  Respiratory: Positive for shortness of breath. Negative for cough.   Cardiovascular: Positive for leg swelling. Negative for chest pain and palpitations.  Gastrointestinal: Negative for abdominal distention, blood in stool, diarrhea and nausea.  Genitourinary: Negative for frequency and hematuria.  Musculoskeletal: Negative for back pain, gait problem, joint swelling and neck pain.  Skin: Negative for rash.  Neurological: Negative for dizziness, tremors, speech difficulty and weakness.  Psychiatric/Behavioral: Negative for agitation, dysphoric mood and sleep disturbance. The patient is not nervous/anxious.     Objective:  BP 132/84 (BP Location: Left Arm, Patient Position: Sitting, Cuff Size: Large)   Pulse (!) 51   Temp 98 F (36.7 C) (  Oral)   Ht 5\' 10"  (1.778 m)   Wt 220 lb (99.8 kg)   SpO2 94% Comment: 3 liters of o2  BMI 31.57 kg/m   BP Readings from Last 3 Encounters:  02/25/19 132/84  10/29/18 122/76  09/11/18 124/70    Wt Readings from Last 3 Encounters:  02/25/19 220 lb (99.8 kg)  10/29/18 205 lb (93 kg)  09/11/18 201 lb 6.4 oz (91.4 kg)    Physical Exam Constitutional:      General: He is not in acute distress.    Appearance: He is well-developed.     Comments: NAD  Eyes:      Conjunctiva/sclera: Conjunctivae normal.     Pupils: Pupils are equal, round, and reactive to light.  Neck:     Musculoskeletal: Normal range of motion.     Thyroid: No thyromegaly.     Vascular: No JVD.  Cardiovascular:     Rate and Rhythm: Normal rate and regular rhythm.     Heart sounds: Normal heart sounds. No murmur. No friction rub. No gallop.   Pulmonary:     Effort: Pulmonary effort is normal. No respiratory distress.     Breath sounds: Normal breath sounds. No wheezing or rales.  Chest:     Chest wall: No tenderness.  Abdominal:     General: Bowel sounds are normal. There is no distension.     Palpations: Abdomen is soft. There is no mass.     Tenderness: There is no abdominal tenderness. There is no guarding or rebound.  Musculoskeletal: Normal range of motion.        General: No tenderness.  Lymphadenopathy:     Cervical: No cervical adenopathy.  Skin:    General: Skin is warm and dry.     Findings: No rash.  Neurological:     Mental Status: He is alert and oriented to person, place, and time.     Cranial Nerves: No cranial nerve deficit.     Motor: No abnormal muscle tone.     Coordination: Coordination normal.     Gait: Gait abnormal.     Deep Tendon Reflexes: Reflexes are normal and symmetric.  Psychiatric:        Behavior: Behavior normal.        Thought Content: Thought content normal.        Judgment: Judgment normal.   Cane O2 is on decr BS at bases  Lab Results  Component Value Date   WBC 11.5 (H) 02/17/2018   HGB 14.2 02/17/2018   HCT 40.2 02/17/2018   PLT 286.0 02/17/2018   GLUCOSE 105 (H) 06/24/2018   CHOL 125 07/20/2015   TRIG 136.0 07/20/2015   HDL 25.40 (L) 07/20/2015   LDLCALC 72 07/20/2015   ALT 32 08/01/2015   AST 27 08/01/2015   NA 141 06/24/2018   K 4.1 06/24/2018   CL 101 06/24/2018   CREATININE 1.07 06/24/2018   BUN 20 06/24/2018   CO2 35 (H) 06/24/2018   TSH 2.04 03/31/2015   PSA 4.51 (H) 03/31/2015   INR 1.10 05/20/2015    HGBA1C 5.6 05/22/2015    Dg Chest 2 View  Result Date: 02/17/2018 CLINICAL DATA:  85 year old male with cough for 6 months. Shortness for breath for the past week. Initial encounter. EXAM: CHEST - 2 VIEW COMPARISON:  04/11/2017 CT.  01/20/2017 chest x-ray. FINDINGS: Chronic lung changes without infiltrate or congestive heart failure. Nodular density anterior aspect right sixth rib may represent nipple shadow. Nipple marker view  can be obtained to exclude underlying lung nodule. Calcified markedly tortuous aorta relatively similar to prior exam. Heart size top-normal. No acute osseous abnormality noted. IMPRESSION: Chronic lung changes without infiltrate or congestive heart failure. Nodular density anterior aspect right sixth rib may represent nipple shadow. Nipple marker view can be obtained to exclude underlying lung nodule. Aortic Atherosclerosis (ICD10-I70.0). Calcified markedly tortuous aorta relatively similar to prior exam. Please see prior CT report which included recommendations for follow-up high-resolution chest CT. Electronically Signed   By: Lacy DuverneySteven  Olson M.D.   On: 02/17/2018 15:32    Assessment & Plan:   There are no diagnoses linked to this encounter.   No orders of the defined types were placed in this encounter.    Follow-up: No follow-ups on file.  Sonda PrimesAlex Estefani Bateson, MD

## 2019-03-24 ENCOUNTER — Other Ambulatory Visit: Payer: Self-pay | Admitting: Internal Medicine

## 2019-04-22 ENCOUNTER — Telehealth: Payer: Self-pay | Admitting: *Deleted

## 2019-04-22 NOTE — Telephone Encounter (Signed)
I called to schedule Jon Baker his follow up visit,patient refused and stated he's not coming back any more.

## 2019-05-17 ENCOUNTER — Other Ambulatory Visit: Payer: Self-pay | Admitting: Internal Medicine

## 2019-05-19 ENCOUNTER — Telehealth (INDEPENDENT_AMBULATORY_CARE_PROVIDER_SITE_OTHER): Payer: Medicare Other | Admitting: Cardiovascular Disease

## 2019-05-19 ENCOUNTER — Telehealth: Payer: Self-pay

## 2019-05-19 ENCOUNTER — Other Ambulatory Visit: Payer: Self-pay | Admitting: Internal Medicine

## 2019-05-19 VITALS — Ht 70.0 in | Wt 205.0 lb

## 2019-05-19 DIAGNOSIS — J449 Chronic obstructive pulmonary disease, unspecified: Secondary | ICD-10-CM

## 2019-05-19 DIAGNOSIS — E785 Hyperlipidemia, unspecified: Secondary | ICD-10-CM

## 2019-05-19 DIAGNOSIS — I10 Essential (primary) hypertension: Secondary | ICD-10-CM | POA: Diagnosis not present

## 2019-05-19 DIAGNOSIS — I2102 ST elevation (STEMI) myocardial infarction involving left anterior descending coronary artery: Secondary | ICD-10-CM

## 2019-05-19 DIAGNOSIS — E782 Mixed hyperlipidemia: Secondary | ICD-10-CM

## 2019-05-19 DIAGNOSIS — Z79899 Other long term (current) drug therapy: Secondary | ICD-10-CM

## 2019-05-19 DIAGNOSIS — I255 Ischemic cardiomyopathy: Secondary | ICD-10-CM

## 2019-05-19 DIAGNOSIS — J9611 Chronic respiratory failure with hypoxia: Secondary | ICD-10-CM

## 2019-05-19 DIAGNOSIS — I2510 Atherosclerotic heart disease of native coronary artery without angina pectoris: Secondary | ICD-10-CM

## 2019-05-19 DIAGNOSIS — I252 Old myocardial infarction: Secondary | ICD-10-CM

## 2019-05-19 MED ORDER — FUROSEMIDE 40 MG PO TABS: 40.0000 mg | ORAL_TABLET | Freq: Every day | ORAL | 3 refills | Status: DC

## 2019-05-19 NOTE — Patient Instructions (Addendum)
Medication Instructions:  Your physician has recommended you make the following change in your medication:   TAKE FUROSEMIDE (LASIX) 40 MG, ONE TABLET TWICE A DAY FOR 3 DAYS.  AFTER 3 DAYS, TAKE FUROSEMIDE (LASIX) 40 MG, ONE TABLET DAILY.  If you need a refill on your cardiac medications before your next appointment, please call your pharmacy.   Lab work: Your physician recommends that you return for lab work in 1 WEEK: Anna. You will receive a lab slip in the mail  If you have labs (blood work) drawn today and your tests are completely normal, you will receive your results only by: [name] MyChart Message (if you have MyChart) OR . A paper copy in the mail If you have any lab test that is abnormal or we need to change your treatment, we will call you to review the results.  Testing/Procedures: Your physician has requested that you have an echocardiogram. Echocardiography is a painless test that uses sound waves to create images of your heart. It provides your doctor with information about the size and shape of your heart and how well your heart's chambers and valves are working. This procedure takes approximately one hour. There are no restrictions for this procedure. LOCATION: [facility] [place], [place] [address] YOU WILL BE CONTACTED BY A SCHEDULER TO SET UP THIS APPOINTMENT.    Follow-Up: At [facility], you and your health needs are our priority.  As part of our continuing mission to provide you with exceptional heart care, we have created designated Provider Care Teams.  These Care Teams include your primary Cardiologist (physician) and Advanced Practice Providers (APPs -  Physician Assistants and Nurse Practitioners) who all work together to provide you with the care you need, when you need it. You will need a follow up appointment in 3-4 weeks WITH DR. Gwenlyn Found.  You will be contacted by a scheduler to set up this appointment.

## 2019-05-19 NOTE — Progress Notes (Signed)
Virtual Visit via Telephone Note   This visit type was conducted due to national recommendations for restrictions regarding the COVID-19 Pandemic (e.g. social distancing) in an effort to limit this patient's exposure and mitigate transmission in our community.  Due to his co-morbid illnesses, this patient is at least at moderate risk for complications without adequate follow up.  This format is felt to be most appropriate for this patient at this time.  The patient did not have access to video technology/had technical difficulties with video requiring transitioning to audio format only (telephone).  All issues noted in this document were discussed and addressed.  No physical exam could be performed with this format.  Please refer to the patient's chart for his  consent to telehealth for [facility].   Date:  05/19/2019   ID:  Jon Baker, DOB February 24, 1932, MRN [id number]  Patient Location: Home Provider Location: Home  PCP:  [name], MD  Cardiologist: Dr. [name] Electrophysiologist:  None   Evaluation Performed:  Follow-Up Visit  Chief Complaint: Shortness of breath and lower extremity edema  History of Present Illness:    Jon Baker is a 85 y.o.  married Caucasian male, retired Retail buyer, with a history of hypertension and dyslipidemia as well as COPD followed by Dr. [name] . I last saw him in the office 05/12/2018.He was seen by myself in [facility] emergency room on 05/20/15 with an EKG that showed anterior ST segment elevation myocardial infarction. He was taken to the cardiac catheterization laboratory by Dr. Excell Seltzer where he had a 99% proximal LAD lesion. Went aspiration thrombectomy and stenting using a drug-eluting stent. Otherwise he had noncritical CAD with an anteroapical wall motion and LV, and ejection fraction of 35-40%. His Brilenta was ultimately changed to Plavix because of symptoms of shortness of breath.. He does have COPD and  dyspnea on exertion but denies chest pain.his P2Y12. Verify now platelet reactivity test was favorable at 176.A 2-D echocardiogram performed 08/29/15 revealed improvement in his ejection fraction up to 60% without wall motion abnormalities.Since I saw him in November 2016 he is done well. He continues to complain of shortness of breath and has seen a pulmonologist who placed him on inhaled bronchodilators and continuous oxygen therapy.  Since I saw him a year ago he continues to complain of chronic shortness of breath on continuous O2 and bronchodilators per his pulmonologist for COPD.  He also complains of lower extremity edema which is a new finding.  He has chronic cough and mucus production but denies fever or systemic signs or symptoms of COVID-19.  The patient does not have symptoms concerning for COVID-19 infection (fever, chills, cough, or new shortness of breath).    Past Medical History:  Diagnosis Date  . Aortic stenosis    a. Mild by echo 08/2015.   [name] CAD (coronary artery disease)    a. STEMI 05/2015 s/p DES to LAD.  [name] Claudication [facility])    a. 2009, saw  cardiology, declined ABIs.  [name] COPD (chronic obstructive pulmonary disease) (HCC)    states he has been told by MD he has copd  . Depression   . DJD (degenerative joint disease)   . Dyslipidemia 2016  . HTN (hypertension)   . Ischemic cardiomyopathy    a. 05/2015: EF 35% at cath, 35-40% by echo. b. EF improved to 55-60% by echo 08/2015.  [name] Pancreatitis, gallstone 05/2013  . RBBB   . STEMI (ST elevation myocardial infarction) (HCC) 05/20/15  3.0 x 28 mm Promus premier DES to the LAD   Past Surgical History:  Procedure Laterality Date  . CARDIAC CATHETERIZATION N/A 05/20/2015   Procedure: Left Heart Cath and Coronary Angiography;  Surgeon: Tonny BollmanMichael Cooper, MD; LAD 99%, circumflex 50%, RCA 40%, EF 35% with akinesis in the mid anterior wall apex and inferior apex   . CARDIAC CATHETERIZATION N/A 05/20/2015   Procedure: Coronary  Stent Intervention;  Surgeon: Tonny BollmanMichael Cooper, MD;  3.0 x 28 mm Promus premier DES to the LAD  . CHOLECYSTECTOMY N/A 06/06/2013   Procedure: LAPAROSCOPIC CHOLECYSTECTOMY WITH INTRAOPERATIVE CHOLANGIOGRAM;  Surgeon: [name], MD;  Location: [facility] OR;  Service: General;  Laterality: N/A;  . Glanglian cyst removal Left 1970's  . myleogram  1979  . TOTAL KNEE ARTHROPLASTY Right 02-2011   Dr [name]     Current Meds  Medication Sig  . aspirin EC 81 MG tablet Take 1 tablet (81 mg total) by mouth daily.  [name]. atorvastatin (LIPITOR) 80 MG tablet TAKE 1 TABLET BY MOUTH DAILY AT 6 PM  . carvedilol (COREG) 25 MG tablet TAKE 1 TABLET (25 MG TOTAL) BY MOUTH 2 (TWO) TIMES DAILY WITH A MEAL.  . cholecalciferol (VITAMIN D) 1000 UNITS tablet Take 1,000 Units by mouth daily.  . clopidogrel (PLAVIX) 75 MG tablet TAKE 1 TABLET BY MOUTH EVERY DAY  . guaiFENesin (MUCINEX) 600 MG 12 hr tablet Take 600 mg by mouth daily.  [name]. ipratropium-albuterol (DUONEB) 0.5-2.5 (3) MG/3ML SOLN Take 3 mLs by nebulization every 6 (six) hours as needed.  [name]. losartan (COZAAR) 100 MG tablet Take 1 tablet (100 mg total) by mouth daily.  . nitroGLYCERIN (NITROSTAT) 0.4 MG SL tablet Place 1 tablet (0.4 mg total) under the tongue every 5 (five) minutes as needed for chest pain.  . potassium chloride SA (K-DUR,KLOR-CON) 20 MEQ tablet Take 1 tablet (20 mEq total) by mouth daily.  . predniSONE (DELTASONE) 10 MG tablet TAKE 1 TABLET BY MOUTH EVERY MORNING AFTER MEALS  . SYMBICORT 160-4.5 MCG/ACT inhaler TAKE 2 PUFFS BY MOUTH TWICE A DAY  . VENTOLIN HFA 108 (90 Base) MCG/ACT inhaler INHALE 2 PUFFS INTO THE LUNGS EVERY 6 HOURS AS NEEDED FOR WHEEZING/SHORTNESS OF BREATH   Current Facility-Administered Medications for the 05/19/19 encounter (Telemedicine) with [name], [name], MD  Medication  . ipratropium-albuterol (DUONEB) 0.5-2.5 (3) MG/3ML nebulizer solution 3 mL     Allergies:   Patient has no known allergies.   Social History    Tobacco Use  . Smoking status: Former Smoker    Packs/day: 2.00    Years: 49.00    Pack years: 98.00    Types: Cigarettes    Start date: 04/26/1939    Last attempt to quit: 12/10/1987    Years since quitting: 31.4  . Smokeless tobacco: Never Used  Substance Use Topics  . Alcohol use: No  . Drug use: No     Family Hx: The patient's family history includes Breast cancer in his sister; Coronary artery disease in his father and mother; Pancreatic cancer in his mother. There is no history of Diabetes or Lung disease.  ROS:   Please see the history of present illness.     All other systems reviewed and are negative.   Prior CV studies:   The following studies were reviewed today:  None  Labs/Other Tests and Data Reviewed:    EKG:  No ECG reviewed.  Recent Labs: 02/25/2019: ALT 25; BUN 18; Creatinine, Ser 0.93; Potassium 3.2; Sodium 139;  TSH 2.24   Recent Lipid Panel Lab Results  Component Value Date/Time   CHOL 125 07/20/2015 08:20 AM   TRIG 136.0 07/20/2015 08:20 AM   HDL 25.40 (L) 07/20/2015 08:20 AM   CHOLHDL 5 07/20/2015 08:20 AM   LDLCALC 72 07/20/2015 08:20 AM    Wt Readings from Last 3 Encounters:  05/19/19 205 lb (93 kg)  02/25/19 220 lb (99.8 kg)  10/29/18 205 lb (93 kg)     Objective:    Vital Signs:  Ht 5\' 10"  (1.778 m)   Wt 205 lb (93 kg)   BMI 29.41 kg/m    VITAL SIGNS:  reviewed a complete physical exam was not performed today since this was a virtual telemedicine phone visit  ASSESSMENT & PLAN:    1. Coronary artery disease- history of CAD status post anterior STEMI 05/19/2015 treated with proximal LAD PCI drug-eluting stenting by Dr. Burt Knack.  He had no other significant CAD at that time although his EF was 35 to 40% which ultimately improved up to normal 3 months later by echo 08/29/2015. 2. Essential hypertension- on losartan 3. Hyperlipidemia- on atorvastatin 4. COPD- on chronic O2 and inhaled bronchodilators 5. Lower extremity edema- on Lasix  which he takes sporadically  COVID-19 Education: The signs and symptoms of COVID-19 were discussed with the patient and how to seek care for testing (follow up with PCP or arrange E-visit).  The importance of social distancing was discussed today.  Time:   Today, I have spent 7 minutes with the patient with telehealth technology discussing the above problems.     Medication Adjustments/Labs and Tests Ordered: Current medicines are reviewed at length with the patient today.  Concerns regarding medicines are outlined above.   Tests Ordered: No orders of the defined types were placed in this encounter.   Medication Changes: No orders of the defined types were placed in this encounter.   Disposition:  Follow up in 4 week(s)  Signed, Quay Burow, MD  05/19/2019 2:24 PM    [place]

## 2019-05-19 NOTE — Telephone Encounter (Signed)
Reviewed 6/10 AVS instructions with patient and pt daughter; verbalized understanding. Set up mychart with pt daughter and she confirmed access.  AFTER VISIT SUMMARY RELEASED TO [facility]

## 2019-05-25 ENCOUNTER — Other Ambulatory Visit: Payer: Self-pay | Admitting: Pharmacist Clinician (PhC)/ Clinical Pharmacy Specialist

## 2019-05-25 DIAGNOSIS — Z79899 Other long term (current) drug therapy: Secondary | ICD-10-CM | POA: Diagnosis not present

## 2019-05-25 LAB — BASIC METABOLIC PANEL
BUN/Creatinine Ratio: 15 (ref 10–24)
BUN: 15 mg/dL (ref 8–27)
CO2: 32 mmol/L — ABNORMAL HIGH (ref 20–29)
Calcium: 9.2 mg/dL (ref 8.6–10.2)
Chloride: 95 mmol/L — ABNORMAL LOW (ref 96–106)
Creatinine, Ser: 1 mg/dL (ref 0.76–1.27)
GFR calc Af Amer: 79 mL/min/{1.73_m2} (ref >59)
GFR calc non Af Amer: 68 mL/min/{1.73_m2} (ref >59)
Glucose: 123 mg/dL — ABNORMAL HIGH (ref 65–99)
Potassium: 3.2 mmol/L — ABNORMAL LOW (ref 3.5–5.2)
Sodium: 140 mmol/L (ref 134–144)

## 2019-05-25 MED ORDER — POTASSIUM CHLORIDE CRYS ER 20 MEQ PO TBCR: 40.0000 meq | EXTENDED_RELEASE_TABLET | Freq: Every day | ORAL | 11 refills | Status: DC

## 2019-05-31 ENCOUNTER — Telehealth: Payer: Self-pay | Admitting: Cardiovascular Disease

## 2019-05-31 NOTE — Telephone Encounter (Signed)
New Message            Patient is needing a appoitment within 3 to 4 weeks nothing is available until Sept. Pls call to advise

## 2019-05-31 NOTE — Telephone Encounter (Signed)
Spoke with Jon Baker, patient is having to have lab work redrawn and is needing a fu in 4 weeks from the appt 05/19/2019. They would like to do the lab work same day as appt with berry. He has an echo 06-02-2019 also. Will forward to chima, dr berry's nurse to help with scheduling. Not sure if in office or virtual visit preferred.

## 2019-06-01 ENCOUNTER — Telehealth (HOSPITAL_COMMUNITY): Payer: Self-pay | Admitting: Radiology

## 2019-06-01 NOTE — Telephone Encounter (Signed)

## 2019-06-02 ENCOUNTER — Ambulatory Visit (HOSPITAL_COMMUNITY): Payer: Medicare Other | Attending: Cardiology

## 2019-06-02 ENCOUNTER — Other Ambulatory Visit: Payer: Self-pay

## 2019-06-02 DIAGNOSIS — I252 Old myocardial infarction: Secondary | ICD-10-CM | POA: Insufficient documentation

## 2019-06-04 ENCOUNTER — Telehealth: Payer: Self-pay

## 2019-06-04 NOTE — Telephone Encounter (Signed)
    COVID-19 Pre-Screening Questions:  . In the past 7 to 10 days have you had a cough,  shortness of breath, headache, congestion, fever (100 or greater) body aches, chills, sore throat, or sudden loss of taste or sense of smell? NO . Have you been around anyone with known Covid 19? NO . Have you been around anyone who is awaiting Covid 19 test results in the past 7 to 10 days? NO . Have you been around anyone who has been exposed to Covid 19, or has mentioned symptoms of Covid 19 within the past 7 to 10 days? NO   Appt set for 7/1 at 2:15pm. DPR on file. questionnaire reviewed with pt daughter Ebony Hail. She is aware of restrictions for non-patients   If you have any concerns/questions about symptoms patients report during screening (either on the phone or at threshold). Contact the provider seeing the patient or DOD for further guidance.  If neither are available contact a member of the leadership team.

## 2019-06-08 ENCOUNTER — Other Ambulatory Visit: Payer: Self-pay

## 2019-06-08 DIAGNOSIS — Z79899 Other long term (current) drug therapy: Secondary | ICD-10-CM

## 2019-06-09 ENCOUNTER — Other Ambulatory Visit: Payer: Self-pay

## 2019-06-09 ENCOUNTER — Encounter: Payer: Self-pay | Admitting: Cardiovascular Disease

## 2019-06-09 ENCOUNTER — Ambulatory Visit (INDEPENDENT_AMBULATORY_CARE_PROVIDER_SITE_OTHER): Payer: Medicare Other | Admitting: Cardiovascular Disease

## 2019-06-09 VITALS — BP 156/94 | HR 56 | Temp 97.9°F | Ht 71.0 in | Wt 220.0 lb

## 2019-06-09 DIAGNOSIS — Z79899 Other long term (current) drug therapy: Secondary | ICD-10-CM

## 2019-06-09 DIAGNOSIS — I255 Ischemic cardiomyopathy: Secondary | ICD-10-CM | POA: Diagnosis not present

## 2019-06-09 DIAGNOSIS — I5022 Chronic systolic (congestive) heart failure: Secondary | ICD-10-CM

## 2019-06-09 MED ORDER — FUROSEMIDE 40 MG PO TABS: 40.0000 mg | ORAL_TABLET | Freq: Two times a day (BID) | ORAL | 3 refills | Status: DC

## 2019-06-09 MED ORDER — POTASSIUM CHLORIDE CRYS ER 20 MEQ PO TBCR: 80.0000 meq | EXTENDED_RELEASE_TABLET | Freq: Every day | ORAL | 4 refills | Status: AC

## 2019-06-09 NOTE — Assessment & Plan Note (Signed)
Mr. Jon Baker returns today after being seen on 05/19/2019 virtually with complaints of increasing shortness of breath and lower extremity edema.  I did increase his diuretics for several days which resulted in some improvement in his edema although he still complains of abdominal fullness, increasing shortness of breath and lower extremity edema.  He does have a history of severe COPD which is oxygen dependent.  His 2D echo performed 4 years ago showed normal ejection fraction and a repeat 2D echo performed 06/02/2019 revealed a decline in EF from 40 to 40 to 45% with elevated left atrial pressures.  I think Mr. Jon Baker would require right and left heart cath however he has declined this.  I am going to increase his furosemide from 40 mg once a day to twice a day and increase his potassium supplementation as well since his recent K was measured at 3.2.  I will arrange for him to be seen back by an APP in 4 to 6 weeks and might by myself in 3 months.

## 2019-06-09 NOTE — Patient Instructions (Addendum)
Medication Instructions:  Your physician has recommended you make the following change in your medication:   INCREASE YOUR LASIX TO 40 MG, TWICE A DAY  INCREASE YOUR POTASSIUM TO 80 MEQ DAILY  If you need a refill on your cardiac medications before your next appointment, please call your pharmacy.   Lab work: Your physician recommends that you have lab work done today and return for lab work in 7-10 days:  Today's lab work: BASIC METABOLIC PANEL  Repeat lab work: BASIC METABOLIC PANEL  If you have labs (blood work) drawn today and your tests are completely normal, you will receive your results only by: [name] MyChart Message (if you have MyChart) OR . A paper copy in the mail If you have any lab test that is abnormal or we need to change your treatment, we will call you to review the results.  Testing/Procedures: NONE  Follow-Up: At [facility], you and your health needs are our priority.  As part of our continuing mission to provide you with exceptional heart care, we have created designated Provider Care Teams.  These Care Teams include your primary Cardiologist (physician) and Advanced Practice Providers (APPs -  Physician Assistants and Nurse Practitioners) who all work together to provide you with the care you need, when you need it. . You will need a follow up appointment in 4-6  weeks with an APP and in 3 months with Dr. Gwenlyn Found.  You may see one of the following Advanced Practice Providers on your designated Care Team:   . Kerin Ransom, [place] . Almyra Deforest, PA-C . Fabian Sharp, PA-C . Jory Sims, DNP . Rosaria Ferries, PA-C . Roby Lofts, PA-C . Sande Rives, PA-C

## 2019-06-09 NOTE — Progress Notes (Signed)
06/09/2019 Jon Baker   1932-04-18  [id number]  Primary Physician [name], Georgina QuintAleksei V, MD Primary Cardiologist: Runell GessJonathan J Eulalah Rupert MD [name], FACC, [name], [name]  HPI:  Jon Baker is a 85 y.o.  married Caucasian male, retired Retail buyerdeisel mechanic, with a history of hypertension and dyslipidemia as well as COPD followed by Dr. Posey ReaPlotnikov . I last saw him in the office 05/19/2019.[facility] on 05/20/15 with an EKG that showed anterior ST segment elevation myocardial infarction. He was taken to the cardiac catheterization laboratory by Dr. Excell Seltzerooper where he had a 99% proximal LAD lesion. Went aspiration thrombectomy and stenting using a drug-eluting stent. Otherwise he had noncritical CAD with an anteroapical wall motion and LV, and ejection fraction of 35-40%. His Brilenta was ultimately changed to Plavix because of symptoms of shortness of breath.. He does have COPD and dyspnea on exertion but denies chest pain.his P2Y12. Verify now platelet reactivity test was favorable at 176.A 2-D echocardiogram performed 08/29/15 revealed improvement in his ejection fraction up to 60% without wall motion abnormalities.Since I saw him in November 2016 he is done well. He continues to complain of shortness of breath and has seen a pulmonologist who placed him on inhaled bronchodilatorsand continuous oxygen therapy.   He was complaining of increasing shortness of breath on continuous O2 and bronchodilators per his pulmonologist for COPD.  He also complains of lower extremity edema which is a new finding.  He has chronic cough and mucus production but denies fever or systemic signs or symptoms of COVID-19.  I will increase his diuretics initially over the last 3 weeks and get a repeat 2D echo which revealed a decline in his EF to 40 to 45% performed 06/02/2019.  Etiology is unclear why his ejection fraction has declined.   Current Meds  Medication Sig  . aspirin EC  81 MG tablet Take 1 tablet (81 mg total) by mouth daily.  [name]. atorvastatin (LIPITOR) 80 MG tablet TAKE 1 TABLET BY MOUTH DAILY AT 6 PM  . carvedilol (COREG) 25 MG tablet TAKE 1 TABLET (25 MG TOTAL) BY MOUTH 2 (TWO) TIMES DAILY WITH A MEAL.  . cholecalciferol (VITAMIN D) 1000 UNITS tablet Take 1,000 Units by mouth daily.  . clopidogrel (PLAVIX) 75 MG tablet TAKE 1 TABLET BY MOUTH EVERY DAY  . furosemide (LASIX) 40 MG tablet Take 1 tablet (40 mg total) by mouth daily. TAKE 40 MG OF LASIX TWICE A DAY FOR 3 DAYS. AFTER 3 DAYS, DECREASE YOUR LASIX TO 40 MG DAILY.  [name]. guaiFENesin (MUCINEX) 600 MG 12 hr tablet Take 600 mg by mouth daily.  [name]. ipratropium-albuterol (DUONEB) 0.5-2.5 (3) MG/3ML SOLN Take 3 mLs by nebulization every 6 (six) hours as needed.  [name]. losartan (COZAAR) 100 MG tablet TAKE 1 TABLET BY MOUTH EVERY DAY  . nitroGLYCERIN (NITROSTAT) 0.4 MG SL tablet Place 1 tablet (0.4 mg total) under the tongue every 5 (five) minutes as needed for chest pain.  . potassium chloride SA (K-DUR) 20 MEQ tablet Take 2 tablets (40 mEq total) by mouth daily.  . predniSONE (DELTASONE) 10 MG tablet TAKE 1 TABLET BY MOUTH EVERY MORNING AFTER MEALS  . VENTOLIN HFA 108 (90 Base) MCG/ACT inhaler INHALE 2 PUFFS INTO THE LUNGS EVERY 6 HOURS AS NEEDED FOR WHEEZING/SHORTNESS OF BREATH   Current Facility-Administered Medications for the 06/09/19 encounter (Office Visit) with [name], Paidyn Mcferran J, MD  Medication  . ipratropium-albuterol (DUONEB) 0.5-2.5 (3) MG/3ML nebulizer solution 3  mL     No Known Allergies  Social History   Socioeconomic History  . Marital status: Married    Spouse name: Not on file  . Number of children: Not on file  . Years of education: Not on file  . Highest education level: Not on file  Occupational History  . Not on file  Social Needs  . Financial resource strain: Not on file  . Food insecurity    Worry: Not on file    Inability: Not on file  . Transportation needs    Medical: Not on file     Non-medical: Not on file  Tobacco Use  . Smoking status: Former Smoker    Packs/day: 2.00    Years: 49.00    Pack years: 98.00    Types: Cigarettes    Start date: 04/26/1939    Quit date: 12/10/1987    Years since quitting: 31.5  . Smokeless tobacco: Never Used  Substance and Sexual Activity  . Alcohol use: No  . Drug use: No  . Sexual activity: Not on file  Lifestyle  . Physical activity    Days per week: Not on file    Minutes per session: Not on file  . Stress: Not on file  Relationships  . Social Musician on phone: Not on file    Gets together: Not on file    Attends religious service: Not on file    Active member of club or organization: Not on file    Attends meetings of clubs or organizations: Not on file    Relationship status: Not on file  . Intimate partner violence    Fear of current or ex partner: Not on file    Emotionally abused: Not on file    Physically abused: Not on file    Forced sexual activity: Not on file  Other Topics Concern  . Not on file  Social History Narrative   [facility] (02/12/17):   Originally from [place]. 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 No pets currently. No bird or mold exposure. Doesn't have any hobbies currently. Previously enjoyed square dancing.      Review of Systems: General: negative for chills, fever, night sweats or weight changes.  Cardiovascular: negative for chest pain, dyspnea on exertion, edema, orthopnea, palpitations, paroxysmal nocturnal dyspnea or shortness of breath Dermatological: negative for rash Respiratory: negative for cough or wheezing Urologic: negative for hematuria Abdominal: negative for nausea, vomiting, diarrhea, bright red blood per rectum, melena, or hematemesis Neurologic: negative for visual changes, syncope, or dizziness All other systems reviewed  and are otherwise negative except as noted above.    Blood pressure (!) 156/94, pulse (!) 56, temperature 97.9 F (36.6 C), height 5\' 11"  (1.803 m), weight 220 lb (99.8 kg).  General appearance: alert and no distress Neck: no adenopathy, no carotid bruit, no JVD, supple, symmetrical, trachea midline and thyroid not enlarged, symmetric, no tenderness/mass/nodules Lungs: clear to auscultation bilaterally Heart: regular rate and rhythm, S1, S2 normal, no murmur, click, rub or gallop Extremities: 2+ pitting edema bilaterally Pulses: 2+ and symmetric Skin: Skin color, texture, turgor normal. No rashes or lesions Neurologic: Alert and oriented X 3, normal strength and tone. Normal symmetric reflexes. Normal coordination and gait  EKG sinus bradycardia at 58 right bundle branch block and anterior Q waves.  No  change since prior EKG.  Personally reviewed this EKG.  ASSESSMENT AND PLAN:   Chronic systolic heart failure [facility]) Mr. Deringer returns today after being seen on 05/19/2019 virtually with complaints of increasing shortness of breath and lower extremity edema.  I did increase his diuretics for several days which resulted in some improvement in his edema although he still complains of abdominal fullness, increasing shortness of breath and lower extremity edema.  He does have a history of severe COPD which is oxygen dependent.  His 2D echo performed 4 years ago showed normal ejection fraction and a repeat 2D echo performed 06/02/2019 revealed a decline in EF from 40 to 40 to 45% with elevated left atrial pressures.  I think Mr. Tapley would require right and left heart cath however he has declined this.  I am going to increase his furosemide from 40 mg once a day to twice a day and increase his potassium supplementation as well since his recent K was measured at 3.2.  I will arrange for him to be seen back by an APP in 4 to 6 weeks and might by myself in 3 months.      Lorretta Harp MD  FACP,FACC,[name], [facility] 06/09/2019 2:55 PM

## 2019-06-15 ENCOUNTER — Other Ambulatory Visit: Payer: Self-pay | Admitting: Cardiovascular Disease

## 2019-06-24 ENCOUNTER — Ambulatory Visit (INDEPENDENT_AMBULATORY_CARE_PROVIDER_SITE_OTHER): Payer: Medicare Other | Admitting: Internal Medicine

## 2019-06-24 ENCOUNTER — Other Ambulatory Visit: Payer: Self-pay

## 2019-06-24 ENCOUNTER — Ambulatory Visit (INDEPENDENT_AMBULATORY_CARE_PROVIDER_SITE_OTHER)
Admission: RE | Admit: 2019-06-24 | Discharge: 2019-06-24 | Disposition: A | Discharge status: Home / Self Care | Payer: Medicare Other | Source: Ambulatory Visit | Attending: Internal Medicine | Admitting: Internal Medicine

## 2019-06-24 ENCOUNTER — Encounter: Payer: Self-pay | Admitting: Internal Medicine

## 2019-06-24 VITALS — BP 176/102 | HR 73 | Temp 97.7°F | Ht 71.0 in | Wt 215.0 lb

## 2019-06-24 DIAGNOSIS — I700 Atherosclerosis of aorta: Secondary | ICD-10-CM | POA: Diagnosis not present

## 2019-06-24 DIAGNOSIS — R059 Cough, unspecified: Secondary | ICD-10-CM

## 2019-06-24 DIAGNOSIS — I5042 Chronic combined systolic (congestive) and diastolic (congestive) heart failure: Secondary | ICD-10-CM

## 2019-06-24 DIAGNOSIS — Z955 Presence of coronary angioplasty implant and graft: Secondary | ICD-10-CM | POA: Diagnosis not present

## 2019-06-24 DIAGNOSIS — J449 Chronic obstructive pulmonary disease, unspecified: Secondary | ICD-10-CM

## 2019-06-24 DIAGNOSIS — R0600 Dyspnea, unspecified: Secondary | ICD-10-CM | POA: Diagnosis not present

## 2019-06-24 DIAGNOSIS — R0902 Hypoxemia: Secondary | ICD-10-CM

## 2019-06-24 DIAGNOSIS — I255 Ischemic cardiomyopathy: Secondary | ICD-10-CM | POA: Diagnosis not present

## 2019-06-24 DIAGNOSIS — Z87891 Personal history of nicotine dependence: Secondary | ICD-10-CM | POA: Diagnosis not present

## 2019-06-24 DIAGNOSIS — R05 Cough: Secondary | ICD-10-CM | POA: Diagnosis not present

## 2019-06-24 DIAGNOSIS — J849 Interstitial pulmonary disease, unspecified: Secondary | ICD-10-CM

## 2019-06-24 DIAGNOSIS — I2510 Atherosclerotic heart disease of native coronary artery without angina pectoris: Secondary | ICD-10-CM

## 2019-06-24 MED ORDER — CEFUROXIME AXETIL 250 MG PO TABS: 250.0000 mg | ORAL_TABLET | Freq: Two times a day (BID) | ORAL | 0 refills | Status: DC

## 2019-06-24 MED ORDER — BUDESONIDE 0.25 MG/2ML IN SUSP: 0.2500 mg | Freq: Two times a day (BID) | RESPIRATORY_TRACT | 5 refills | Status: AC

## 2019-06-24 MED ORDER — LOSARTAN POTASSIUM 100 MG PO TABS: 100.0000 mg | ORAL_TABLET | Freq: Every day | ORAL | 3 refills | Status: DC

## 2019-06-24 MED ORDER — ATORVASTATIN CALCIUM 80 MG PO TABS: 80.0000 mg | ORAL_TABLET | Freq: Every day | ORAL | 3 refills | Status: DC

## 2019-06-24 MED ORDER — CARVEDILOL 25 MG PO TABS: 25.0000 mg | ORAL_TABLET | Freq: Two times a day (BID) | ORAL | 3 refills | Status: AC

## 2019-06-24 MED ORDER — PREDNISONE 10 MG PO TABS: ORAL_TABLET | ORAL | 1 refills | Status: DC

## 2019-06-24 MED ORDER — METHYLPREDNISOLONE ACETATE 80 MG/ML IJ SUSP
80.0000 mg | Freq: Once | INTRAMUSCULAR | Status: AC
  Administered 2019-06-24: 80 mg via INTRAMUSCULAR

## 2019-06-24 NOTE — Assessment & Plan Note (Signed)
Treat COPD, ILD

## 2019-06-24 NOTE — Assessment & Plan Note (Signed)
O2 3L per [place]

## 2019-06-24 NOTE — Assessment & Plan Note (Signed)
Ceftin bid x 10 d CXR

## 2019-06-24 NOTE — Assessment & Plan Note (Addendum)
Worse F/u w/Dr Lamonte Sakai Steroids increased po Depomedrol Added Pulmicort HHN

## 2019-06-24 NOTE — Assessment & Plan Note (Addendum)
O2 3l [place] Duoneb q 4 h Depomedrol 80 mg IM Prednisone 10 mg: take 4 tabs a day x 3 days; then 3 tabs a day x 4 days; then 2 tabs a day x 4 days, then 1 tab a day . Take pc. Added Pulmicort HHN - new rx F/u w/Dr Lamonte Sakai

## 2019-06-24 NOTE — Assessment & Plan Note (Signed)
Improve compliance w/Furosemide

## 2019-06-24 NOTE — Assessment & Plan Note (Signed)
Losartan, Lipitor, Plavix, ASA, Coreg 

## 2019-06-24 NOTE — Progress Notes (Signed)
Subjective:  Patient ID: Jon Baker, male    DOB: 1932-10-06  Age: 85 y.o. MRN: [id number]  CC: No chief complaint on file.   HPI Jon Baker presents for worsening SOB. C/o wheezing. C/o productive cough. Sitting up helps. Using HHN too often. Can't sleep due to SOB Comes w/dtr Jon Baker F/u CHf - worse, ILD - worse, COPD - worse  Outpatient Medications Prior to Visit  Medication Sig Dispense Refill  . aspirin EC 81 MG tablet Take 1 tablet (81 mg total) by mouth daily. 100 tablet 3  . atorvastatin (LIPITOR) 80 MG tablet TAKE 1 TABLET BY MOUTH DAILY AT 6 PM 30 tablet 11  . carvedilol (COREG) 25 MG tablet TAKE 1 TABLET (25 MG TOTAL) BY MOUTH 2 (TWO) TIMES DAILY WITH A MEAL. 60 tablet 11  . cholecalciferol (VITAMIN D) 1000 UNITS tablet Take 1,000 Units by mouth daily.    . clopidogrel (PLAVIX) 75 MG tablet TAKE 1 TABLET BY MOUTH EVERY DAY 90 tablet 2  . furosemide (LASIX) 40 MG tablet Take 1 tablet (40 mg total) by mouth 2 (two) times daily. 180 tablet 3  . guaiFENesin (MUCINEX) 600 MG 12 hr tablet Take 600 mg by mouth daily.    [name]. ipratropium-albuterol (DUONEB) 0.5-2.5 (3) MG/3ML SOLN Take 3 mLs by nebulization every 6 (six) hours as needed. 360 mL 3  . losartan (COZAAR) 100 MG tablet TAKE 1 TABLET BY MOUTH EVERY DAY 30 tablet 5  . nitroGLYCERIN (NITROSTAT) 0.4 MG SL tablet Place 1 tablet (0.4 mg total) under the tongue every 5 (five) minutes as needed for chest pain. 25 tablet 3  . potassium chloride SA (K-DUR) 20 MEQ tablet Take 4 tablets (80 mEq total) by mouth daily. 180 tablet 4  . predniSONE (DELTASONE) 10 MG tablet TAKE 1 TABLET BY MOUTH EVERY MORNING AFTER MEALS 30 tablet 3  . VENTOLIN HFA 108 (90 Base) MCG/ACT inhaler INHALE 2 PUFFS INTO THE LUNGS EVERY 6 HOURS AS NEEDED FOR WHEEZING/SHORTNESS OF BREATH 18 Inhaler 2   Facility-Administered Medications Prior to Visit  Medication Dose Route Frequency Provider Last Rate Last Dose  . ipratropium-albuterol (DUONEB) 0.5-2.5 (3)  MG/3ML nebulizer solution 3 mL  3 mL Nebulization Q6H Saguier, Edward, PA-C        ROS: Review of Systems  Constitutional: Positive for fatigue. Negative for appetite change, fever and unexpected weight change.  HENT: Negative for congestion, nosebleeds, sneezing, sore throat and trouble swallowing.   Eyes: Negative for itching and visual disturbance.  Respiratory: Positive for chest tightness, shortness of breath and wheezing. Negative for cough.   Cardiovascular: Positive for chest pain and leg swelling. Negative for palpitations.  Gastrointestinal: Negative for abdominal distention, blood in stool, diarrhea and nausea.  Genitourinary: Negative for frequency and hematuria.  Musculoskeletal: Negative for back pain, gait problem, joint swelling and neck pain.  Skin: Negative for rash.  Neurological: Negative for dizziness, tremors, speech difficulty and weakness.  Psychiatric/Behavioral: Positive for decreased concentration. Negative for agitation, dysphoric mood and sleep disturbance. The patient is not nervous/anxious.     Objective:  BP (!) 176/102 (BP Location: Left Arm, Patient Position: Sitting, Cuff Size: Large)   Pulse 73   Temp 97.7 F (36.5 C) (Oral)   Ht 5\' 11"  (1.803 m)   Wt 215 lb (97.5 kg)   SpO2 94% Comment: 3 liters of 02  BMI 29.99 kg/m   BP Readings from Last 3 Encounters:  06/24/19 (!) 176/102  06/09/19 (!) 156/94  02/25/19 132/84  Wt Readings from Last 3 Encounters:  06/24/19 215 lb (97.5 kg)  06/09/19 220 lb (99.8 kg)  05/19/19 205 lb (93 kg)    Physical Exam Constitutional:      General: He is not in acute distress.    Appearance: He is well-developed.     Comments: NAD  Eyes:     Conjunctiva/sclera: Conjunctivae normal.     Pupils: Pupils are equal, round, and reactive to light.  Neck:     Musculoskeletal: Normal range of motion.     Thyroid: No thyromegaly.     Vascular: No JVD.  Cardiovascular:     Rate and Rhythm: Normal rate and  regular rhythm.     Heart sounds: Normal heart sounds. No murmur. No friction rub. No gallop.   Pulmonary:     Effort: Respiratory distress present.     Breath sounds: Wheezing and rhonchi present. No rales.  Chest:     Chest wall: No tenderness.  Abdominal:     General: Bowel sounds are normal. There is no distension.     Palpations: Abdomen is soft. There is no mass.     Tenderness: There is no abdominal tenderness. There is no guarding or rebound.  Musculoskeletal: Normal range of motion.        General: No tenderness.     Right lower leg: Edema present.     Left lower leg: Edema present.  Lymphadenopathy:     Cervical: No cervical adenopathy.  Skin:    General: Skin is warm and dry.     Findings: No rash.  Neurological:     Mental Status: He is alert and oriented to person, place, and time.     Cranial Nerves: No cranial nerve deficit.     Motor: No abnormal muscle tone.     Coordination: Coordination normal.     Gait: Gait normal.     Deep Tendon Reflexes: Reflexes are normal and symmetric.  Psychiatric:        Behavior: Behavior normal.        Thought Content: Thought content normal.        Judgment: Judgment normal.   in resp distress  Lab Results  Component Value Date   WBC 11.5 (H) 02/17/2018   HGB 14.2 02/17/2018   HCT 40.2 02/17/2018   PLT 286.0 02/17/2018   GLUCOSE 123 (H) 05/25/2019   CHOL 125 07/20/2015   TRIG 136.0 07/20/2015   HDL 25.40 (L) 07/20/2015   LDLCALC 72 07/20/2015   ALT 25 02/25/2019   AST 18 02/25/2019   NA 140 05/25/2019   K 3.2 (L) 05/25/2019   CL 95 (L) 05/25/2019   CREATININE 1.00 05/25/2019   BUN 15 05/25/2019   CO2 32 (H) 05/25/2019   TSH 2.24 02/25/2019   PSA 4.51 (H) 03/31/2015   INR 1.10 05/20/2015   HGBA1C 5.6 05/22/2015    Dg Chest 2 View  Result Date: 02/17/2018 CLINICAL DATA:  85 year old male with cough for 6 months. Shortness for breath for the past week. Initial encounter. EXAM: CHEST - 2 VIEW COMPARISON:   04/11/2017 CT.  01/20/2017 chest x-ray. FINDINGS: Chronic lung changes without infiltrate or congestive heart failure. Nodular density anterior aspect right sixth rib may represent nipple shadow. Nipple marker view can be obtained to exclude underlying lung nodule. Calcified markedly tortuous aorta relatively similar to prior exam. Heart size top-normal. No acute osseous abnormality noted. IMPRESSION: Chronic lung changes without infiltrate or congestive heart failure. Nodular density anterior aspect right  sixth rib may represent nipple shadow. Nipple marker view can be obtained to exclude underlying lung nodule. Aortic Atherosclerosis (ICD10-I70.0). Calcified markedly tortuous aorta relatively similar to prior exam. Please see prior CT report which included recommendations for follow-up high-resolution chest CT. Electronically Signed   By: Genia Del M.D.   On: 02/17/2018 15:32    Assessment & Plan:   There are no diagnoses linked to this encounter.   No orders of the defined types were placed in this encounter.    Follow-up: No follow-ups on file.  Walker Kehr, MD

## 2019-06-29 ENCOUNTER — Ambulatory Visit: Payer: Medicare Other | Admitting: Internal Medicine

## 2019-07-08 ENCOUNTER — Other Ambulatory Visit: Payer: Self-pay

## 2019-07-08 ENCOUNTER — Ambulatory Visit (INDEPENDENT_AMBULATORY_CARE_PROVIDER_SITE_OTHER): Payer: Medicare Other | Admitting: Internal Medicine

## 2019-07-08 ENCOUNTER — Encounter: Payer: Self-pay | Admitting: Internal Medicine

## 2019-07-08 DIAGNOSIS — J849 Interstitial pulmonary disease, unspecified: Secondary | ICD-10-CM | POA: Diagnosis not present

## 2019-07-08 DIAGNOSIS — I2510 Atherosclerotic heart disease of native coronary artery without angina pectoris: Secondary | ICD-10-CM | POA: Diagnosis not present

## 2019-07-08 DIAGNOSIS — I255 Ischemic cardiomyopathy: Secondary | ICD-10-CM | POA: Diagnosis not present

## 2019-07-08 DIAGNOSIS — R600 Localized edema: Secondary | ICD-10-CM | POA: Diagnosis not present

## 2019-07-08 DIAGNOSIS — R0600 Dyspnea, unspecified: Secondary | ICD-10-CM | POA: Diagnosis not present

## 2019-07-08 DIAGNOSIS — J449 Chronic obstructive pulmonary disease, unspecified: Secondary | ICD-10-CM | POA: Diagnosis not present

## 2019-07-08 NOTE — Assessment & Plan Note (Signed)
On steroids. °

## 2019-07-08 NOTE — Assessment & Plan Note (Signed)
No cough 

## 2019-07-08 NOTE — Assessment & Plan Note (Signed)
Furosemide Pt declined socks

## 2019-07-08 NOTE — Assessment & Plan Note (Signed)
Restart Duoneb (The pt was not using Duoneb - he thought it was replaced by Budesonide)

## 2019-07-08 NOTE — Assessment & Plan Note (Signed)
No angina 

## 2019-07-08 NOTE — Patient Instructions (Signed)
Restart Duoneb four times a day  Continue Budesonide twice a day

## 2019-07-08 NOTE — Progress Notes (Signed)
Subjective:  Patient ID: Jon Baker, male    DOB: [birth year]  Age: 85 y.o. MRN: [id number]  CC: No chief complaint on file.   HPI Jon Baker presents for ILD, COPD, CAD  The pt was not using Duoneb - he thought it was replaced by Budesonide He was cutting his grass twice this week... F/u DOE Not carrying O2 - stubborn  Outpatient Medications Prior to Visit  Medication Sig Dispense Refill  . aspirin EC 81 MG tablet Take 1 tablet (81 mg total) by mouth daily. 100 tablet 3  . atorvastatin (LIPITOR) 80 MG tablet Take 1 tablet (80 mg total) by mouth daily at 6 PM. 90 tablet 3  . budesonide (PULMICORT) 0.25 MG/2ML nebulizer solution Take 2 mLs (0.25 mg total) by nebulization 2 (two) times a day. 360 mL 5  . carvedilol (COREG) 25 MG tablet Take 1 tablet (25 mg total) by mouth 2 (two) times daily with a meal. 180 tablet 3  . cholecalciferol (VITAMIN D) 1000 UNITS tablet Take 1,000 Units by mouth daily.    . clopidogrel (PLAVIX) 75 MG tablet TAKE 1 TABLET BY MOUTH EVERY DAY 90 tablet 2  . furosemide (LASIX) 40 MG tablet Take 1 tablet (40 mg total) by mouth 2 (two) times daily. 180 tablet 3  . guaiFENesin (MUCINEX) 600 MG 12 hr tablet Take 600 mg by mouth daily.    [name] ipratropium-albuterol (DUONEB) 0.5-2.5 (3) MG/3ML SOLN Take 3 mLs by nebulization every 6 (six) hours as needed. 360 mL 3  . losartan (COZAAR) 100 MG tablet Take 1 tablet (100 mg total) by mouth daily. 90 tablet 3  . nitroGLYCERIN (NITROSTAT) 0.4 MG SL tablet Place 1 tablet (0.4 mg total) under the tongue every 5 (five) minutes as needed for chest pain. 25 tablet 3  . potassium chloride SA (K-DUR) 20 MEQ tablet Take 4 tablets (80 mEq total) by mouth daily. 180 tablet 4  . predniSONE (DELTASONE) 10 MG tablet TAKE 1 TABLET BY MOUTH EVERY MORNING AFTER MEALS 30 tablet 3  . VENTOLIN HFA 108 (90 Base) MCG/ACT inhaler INHALE 2 PUFFS INTO THE LUNGS EVERY 6 HOURS AS NEEDED FOR WHEEZING/SHORTNESS OF BREATH 18 Inhaler 2  .  cefUROXime (CEFTIN) 250 MG tablet Take 1 tablet (250 mg total) by mouth 2 (two) times daily. (Patient not taking: Reported on 07/08/2019) 14 tablet 0  . predniSONE (DELTASONE) 10 MG tablet Prednisone 10 mg: take 4 tabs a day x 3 days; then 3 tabs a day x 4 days; then 2 tabs a day x 4 days, then 1 tab a day as before. Take pc. (Patient not taking: Reported on 07/08/2019) 38 tablet 1   No facility-administered medications prior to visit.     ROS: Review of Systems  Constitutional: Negative for appetite change, fatigue and unexpected weight change.  HENT: Negative for congestion, nosebleeds, sneezing, sore throat and trouble swallowing.   Eyes: Negative for itching and visual disturbance.  Respiratory: Positive for shortness of breath. Negative for cough and wheezing.   Cardiovascular: Negative for chest pain, palpitations and leg swelling.  Gastrointestinal: Negative for abdominal distention, blood in stool, diarrhea and nausea.  Genitourinary: Negative for frequency and hematuria.  Musculoskeletal: Positive for arthralgias and gait problem. Negative for back pain, joint swelling and neck pain.  Skin: Negative for rash.  Neurological: Negative for dizziness, tremors, speech difficulty and weakness.  Psychiatric/Behavioral: Negative for agitation, dysphoric mood, sleep disturbance and suicidal ideas. The patient is not nervous/anxious.  Objective:  BP (!) 196/100 (BP Location: Left Arm, Patient Position: Sitting, Cuff Size: Large)   Pulse 64   Temp 97.6 F (36.4 C) (Oral)   Ht 5\' 11"  (1.803 m)   Wt 217 lb (98.4 kg)   SpO2 93%   BMI 30.27 kg/m   BP Readings from Last 3 Encounters:  07/08/19 (!) 196/100  06/24/19 (!) 176/102  06/09/19 (!) 156/94    Wt Readings from Last 3 Encounters:  07/08/19 217 lb (98.4 kg)  06/24/19 215 lb (97.5 kg)  06/09/19 220 lb (99.8 kg)    Physical Exam Constitutional:      General: He is not in acute distress.    Appearance: He is well-developed.      Comments: NAD  Eyes:     Conjunctiva/sclera: Conjunctivae normal.     Pupils: Pupils are equal, round, and reactive to light.  Neck:     Musculoskeletal: Normal range of motion.     Thyroid: No thyromegaly.     Vascular: No JVD.  Cardiovascular:     Rate and Rhythm: Normal rate and regular rhythm.     Heart sounds: Normal heart sounds. No murmur. No friction rub. No gallop.   Pulmonary:     Effort: Respiratory distress present.     Breath sounds: Normal breath sounds. No wheezing or rales.  Chest:     Chest wall: No tenderness.  Abdominal:     General: Bowel sounds are normal. There is no distension.     Palpations: Abdomen is soft. There is no mass.     Tenderness: There is no abdominal tenderness. There is no guarding or rebound.  Musculoskeletal: Normal range of motion.        General: Swelling present. No tenderness.     Right lower leg: Edema present.     Left lower leg: Edema present.  Lymphadenopathy:     Cervical: No cervical adenopathy.  Skin:    General: Skin is warm and dry.     Findings: No rash.  Neurological:     Mental Status: He is alert and oriented to person, place, and time.     Cranial Nerves: No cranial nerve deficit.     Motor: No abnormal muscle tone.     Coordination: Coordination normal.     Gait: Gait normal.     Deep Tendon Reflexes: Reflexes are normal and symmetric.  Psychiatric:        Behavior: Behavior normal.        Thought Content: Thought content normal.        Judgment: Judgment normal.   DOE Crackles at bases B Edema - trace to 1+ B  Lab Results  Component Value Date   WBC 11.5 (H) 02/17/2018   HGB 14.2 02/17/2018   HCT 40.2 02/17/2018   PLT 286.0 02/17/2018   GLUCOSE 123 (H) 05/25/2019   CHOL 125 07/20/2015   TRIG 136.0 07/20/2015   HDL 25.40 (L) 07/20/2015   LDLCALC 72 07/20/2015   ALT 25 02/25/2019   AST 18 02/25/2019   NA 140 05/25/2019   K 3.2 (L) 05/25/2019   CL 95 (L) 05/25/2019   CREATININE 1.00 05/25/2019    BUN 15 05/25/2019   CO2 32 (H) 05/25/2019   TSH 2.24 02/25/2019   PSA 4.51 (H) 03/31/2015   INR 1.10 05/20/2015   HGBA1C 5.6 05/22/2015    Dg Chest 2 View  Result Date: 06/25/2019 CLINICAL DATA:  Increased dypsnea x 3 days. Hx of MI, HTN,  COPD, asthma, CAD, stent. Ex smoker. EXAM: CHEST - 2 VIEW COMPARISON:  02/17/2018 FINDINGS: Cardiac silhouette is normal in size. No mediastinal or hilar masses. No evidence of adenopathy. Aortic atherosclerosis. Aorta is uncoiled and tortuous. Lungs are hyperexpanded mild peripheral interstitial thickening in the lower lungs. No evidence of pneumonia or pulmonary edema. No pleural effusion or pneumothorax. Skeletal structures are intact. IMPRESSION: 1. No acute cardiopulmonary disease. 2. Stable changes of COPD. Electronically Signed   By: Lajean Manes M.D.   On: 06/25/2019 08:58    Assessment & Plan:   There are no diagnoses linked to this encounter.   No orders of the defined types were placed in this encounter.    Follow-up: No follow-ups on file.  Walker Kehr, MD

## 2019-07-09 ENCOUNTER — Ambulatory Visit: Payer: Medicare Other | Admitting: Physician Assistant

## 2019-07-27 ENCOUNTER — Other Ambulatory Visit: Payer: Self-pay | Admitting: Internal Medicine

## 2019-07-27 MED ORDER — PREDNISONE 10 MG PO TABS: ORAL_TABLET | ORAL | 1 refills | Status: AC

## 2019-07-27 NOTE — Progress Notes (Signed)
Needs Rx refilled 

## 2019-08-19 ENCOUNTER — Encounter: Payer: Self-pay | Admitting: Internal Medicine

## 2019-08-19 ENCOUNTER — Telehealth: Payer: Self-pay | Admitting: Internal Medicine

## 2019-08-19 ENCOUNTER — Other Ambulatory Visit: Payer: Self-pay

## 2019-08-19 ENCOUNTER — Ambulatory Visit (INDEPENDENT_AMBULATORY_CARE_PROVIDER_SITE_OTHER): Payer: Medicare Other | Admitting: Internal Medicine

## 2019-08-19 DIAGNOSIS — I255 Ischemic cardiomyopathy: Secondary | ICD-10-CM

## 2019-08-19 DIAGNOSIS — R600 Localized edema: Secondary | ICD-10-CM

## 2019-08-19 DIAGNOSIS — J849 Interstitial pulmonary disease, unspecified: Secondary | ICD-10-CM | POA: Diagnosis not present

## 2019-08-19 DIAGNOSIS — N320 Bladder-neck obstruction: Secondary | ICD-10-CM | POA: Diagnosis not present

## 2019-08-19 DIAGNOSIS — I2510 Atherosclerotic heart disease of native coronary artery without angina pectoris: Secondary | ICD-10-CM

## 2019-08-19 DIAGNOSIS — I5042 Chronic combined systolic (congestive) and diastolic (congestive) heart failure: Secondary | ICD-10-CM | POA: Diagnosis not present

## 2019-08-19 DIAGNOSIS — I10 Essential (primary) hypertension: Secondary | ICD-10-CM

## 2019-08-19 MED ORDER — AMLODIPINE BESYLATE-VALSARTAN 5-160 MG PO TABS: 1.0000 | ORAL_TABLET | Freq: Every day | ORAL | 3 refills | Status: AC

## 2019-08-19 MED ORDER — IPRATROPIUM-ALBUTEROL 0.5-2.5 (3) MG/3ML IN SOLN: 3.0000 mL | Freq: Four times a day (QID) | RESPIRATORY_TRACT | 3 refills | Status: DC | PRN

## 2019-08-19 NOTE — Assessment & Plan Note (Signed)
Worse Start Valsart- Amlodipine Risks associated with treatment noncompliance were discussed. Compliance was encouraged. Loose 5 lbs NAS Pt refused labs

## 2019-08-19 NOTE — Telephone Encounter (Signed)
[facility]- is calling in to request to have the Rx's for [facility] and also ipratropium-albuterol (DUONEB) 0.5-2.5 (3) MG/3ML SOLN directions updated to Every 6 hours and as needed.    This correction is needed before they are able to ship out to pt.      Please resend.

## 2019-08-19 NOTE — Assessment & Plan Note (Signed)
Losartan, Lipitor, Plavix, ASA, Coreg

## 2019-08-19 NOTE — Assessment & Plan Note (Signed)
Monitor wt 

## 2019-08-19 NOTE — Assessment & Plan Note (Signed)
Pt looks better Cont w/PO steroids, HHN, O2

## 2019-08-19 NOTE — Assessment & Plan Note (Signed)
Better Refused labs

## 2019-08-19 NOTE — Patient Instructions (Signed)

## 2019-08-19 NOTE — Progress Notes (Signed)
Subjective:  Patient ID: Jon Baker, male    DOB: 1932/02/15  Age: 85 y.o. MRN: [id number]  CC: No chief complaint on file.   HPI Jon Baker presents for SOB, fatigue, dyslipidemia f/u SBP 140-180 at home Using O2 Refused labs  Outpatient Medications Prior to Visit  Medication Sig Dispense Refill  . aspirin EC 81 MG tablet Take 1 tablet (81 mg total) by mouth daily. 100 tablet 3  . atorvastatin (LIPITOR) 80 MG tablet Take 1 tablet (80 mg total) by mouth daily at 6 PM. 90 tablet 3  . budesonide (PULMICORT) 0.25 MG/2ML nebulizer solution Take 2 mLs (0.25 mg total) by nebulization 2 (two) times a day. 360 mL 5  . carvedilol (COREG) 25 MG tablet Take 1 tablet (25 mg total) by mouth 2 (two) times daily with a meal. 180 tablet 3  . cholecalciferol (VITAMIN D) 1000 UNITS tablet Take 1,000 Units by mouth daily.    . clopidogrel (PLAVIX) 75 MG tablet TAKE 1 TABLET BY MOUTH EVERY DAY 90 tablet 2  . furosemide (LASIX) 40 MG tablet Take 1 tablet (40 mg total) by mouth 2 (two) times daily. 180 tablet 3  . guaiFENesin (MUCINEX) 600 MG 12 hr tablet Take 600 mg by mouth daily.    [name] ipratropium-albuterol (DUONEB) 0.5-2.5 (3) MG/3ML SOLN Take 3 mLs by nebulization every 6 (six) hours as needed. 360 mL 3  . losartan (COZAAR) 100 MG tablet Take 1 tablet (100 mg total) by mouth daily. 90 tablet 3  . nitroGLYCERIN (NITROSTAT) 0.4 MG SL tablet Place 1 tablet (0.4 mg total) under the tongue every 5 (five) minutes as needed for chest pain. 25 tablet 3  . potassium chloride SA (K-DUR) 20 MEQ tablet Take 4 tablets (80 mEq total) by mouth daily. 180 tablet 4  . predniSONE (DELTASONE) 10 MG tablet TAKE 1 TABLET BY MOUTH EVERY MORNING AFTER MEALS 90 tablet 1  . VENTOLIN HFA 108 (90 Base) MCG/ACT inhaler INHALE 2 PUFFS INTO THE LUNGS EVERY 6 HOURS AS NEEDED FOR WHEEZING/SHORTNESS OF BREATH 18 Inhaler 2   No facility-administered medications prior to visit.     ROS: Review of Systems  Constitutional:  Positive for fatigue. Negative for appetite change and unexpected weight change.  HENT: Negative for congestion, nosebleeds, sneezing, sore throat and trouble swallowing.   Eyes: Negative for itching and visual disturbance.  Respiratory: Positive for shortness of breath. Negative for cough.   Cardiovascular: Negative for chest pain, palpitations and leg swelling.  Gastrointestinal: Negative for abdominal distention, blood in stool, diarrhea and nausea.  Genitourinary: Positive for frequency and urgency. Negative for hematuria.  Musculoskeletal: Positive for arthralgias and gait problem. Negative for back pain, joint swelling and neck pain.  Skin: Negative for rash.  Neurological: Negative for dizziness, tremors, speech difficulty and weakness.  Psychiatric/Behavioral: Negative for agitation, dysphoric mood, sleep disturbance and suicidal ideas. The patient is not nervous/anxious.     Objective:  BP (!) 198/104 (BP Location: Left Arm, Patient Position: Sitting, Cuff Size: Large)   Pulse 64   Temp 98.3 F (36.8 C) (Oral)   Ht 5\' 11"  (1.803 m)   Wt 219 lb (99.3 kg)   SpO2 95% Comment: 3 liters of o2  BMI 30.54 kg/m   BP Readings from Last 3 Encounters:  08/19/19 (!) 198/104  07/08/19 (!) 196/100  06/24/19 (!) 176/102    Wt Readings from Last 3 Encounters:  08/19/19 219 lb (99.3 kg)  07/08/19 217 lb (98.4 kg)  06/24/19 215  lb (97.5 kg)    Physical Exam Constitutional:      General: He is not in acute distress.    Appearance: He is well-developed.     Comments: NAD  Eyes:     Conjunctiva/sclera: Conjunctivae normal.     Pupils: Pupils are equal, round, and reactive to light.  Neck:     Musculoskeletal: Normal range of motion.     Thyroid: No thyromegaly.     Vascular: No JVD.  Cardiovascular:     Rate and Rhythm: Normal rate and regular rhythm.     Heart sounds: Normal heart sounds. No murmur. No friction rub. No gallop.   Pulmonary:     Effort: Pulmonary effort is  normal. No respiratory distress.     Breath sounds: Normal breath sounds. No wheezing or rales.  Chest:     Chest wall: No tenderness.  Abdominal:     General: Bowel sounds are normal. There is no distension.     Palpations: Abdomen is soft. There is no mass.     Tenderness: There is no abdominal tenderness. There is no guarding or rebound.  Musculoskeletal: Normal range of motion.        General: No tenderness.  Lymphadenopathy:     Cervical: No cervical adenopathy.  Skin:    General: Skin is warm and dry.     Findings: No rash.  Neurological:     Mental Status: He is alert and oriented to person, place, and time.     Cranial Nerves: No cranial nerve deficit.     Motor: No abnormal muscle tone.     Coordination: Coordination normal.     Gait: Gait normal.     Deep Tendon Reflexes: Reflexes are normal and symmetric.  Psychiatric:        Behavior: Behavior normal.        Thought Content: Thought content normal.        Judgment: Judgment normal.   dysphoric Looks better Trace edema B  No JVD  Lab Results  Component Value Date   WBC 11.5 (H) 02/17/2018   HGB 14.2 02/17/2018   HCT 40.2 02/17/2018   PLT 286.0 02/17/2018   GLUCOSE 123 (H) 05/25/2019   CHOL 125 07/20/2015   TRIG 136.0 07/20/2015   HDL 25.40 (L) 07/20/2015   LDLCALC 72 07/20/2015   ALT 25 02/25/2019   AST 18 02/25/2019   NA 140 05/25/2019   K 3.2 (L) 05/25/2019   CL 95 (L) 05/25/2019   CREATININE 1.00 05/25/2019   BUN 15 05/25/2019   CO2 32 (H) 05/25/2019   TSH 2.24 02/25/2019   PSA 4.51 (H) 03/31/2015   INR 1.10 05/20/2015   HGBA1C 5.6 05/22/2015    Dg Chest 2 View  Result Date: 06/25/2019 CLINICAL DATA:  Increased dypsnea x 3 days. Hx of MI, HTN, COPD, asthma, CAD, stent. Ex smoker. EXAM: CHEST - 2 VIEW COMPARISON:  02/17/2018 FINDINGS: Cardiac silhouette is normal in size. No mediastinal or hilar masses. No evidence of adenopathy. Aortic atherosclerosis. Aorta is uncoiled and tortuous. Lungs are  hyperexpanded mild peripheral interstitial thickening in the lower lungs. No evidence of pneumonia or pulmonary edema. No pleural effusion or pneumothorax. Skeletal structures are intact. IMPRESSION: 1. No acute cardiopulmonary disease. 2. Stable changes of COPD. Electronically Signed   By: Amie Portland M.D.   On: 06/25/2019 08:58    Assessment & Plan:   There are no diagnoses linked to this encounter.   No orders of the defined  types were placed in this encounter.    Follow-up: No follow-ups on file.  Walker Kehr, MD

## 2019-08-19 NOTE — Assessment & Plan Note (Signed)
Skipping Lasix - discussed

## 2019-08-20 NOTE — Telephone Encounter (Signed)
Waiting for PCP signature to be faxed

## 2019-08-30 DIAGNOSIS — Z961 Presence of intraocular lens: Secondary | ICD-10-CM | POA: Diagnosis not present

## 2019-08-30 DIAGNOSIS — H3530 Unspecified macular degeneration: Secondary | ICD-10-CM | POA: Diagnosis not present

## 2019-08-30 DIAGNOSIS — H524 Presbyopia: Secondary | ICD-10-CM | POA: Diagnosis not present

## 2019-08-30 DIAGNOSIS — I10 Essential (primary) hypertension: Secondary | ICD-10-CM | POA: Diagnosis not present

## 2019-08-30 DIAGNOSIS — H52223 Regular astigmatism, bilateral: Secondary | ICD-10-CM | POA: Diagnosis not present

## 2019-08-30 DIAGNOSIS — H5203 Hypermetropia, bilateral: Secondary | ICD-10-CM | POA: Diagnosis not present

## 2019-09-10 ENCOUNTER — Ambulatory Visit: Payer: Medicare Other | Admitting: Cardiovascular Disease

## 2019-09-16 ENCOUNTER — Other Ambulatory Visit: Payer: Self-pay | Admitting: Cardiovascular Disease

## 2019-09-16 NOTE — Telephone Encounter (Signed)
Pt due for 2 month f/u. Please contact pt for future appointment .

## 2019-10-19 ENCOUNTER — Ambulatory Visit: Payer: Medicare Other | Admitting: Internal Medicine

## 2019-10-27 ENCOUNTER — Other Ambulatory Visit: Payer: Self-pay

## 2019-10-27 ENCOUNTER — Ambulatory Visit (INDEPENDENT_AMBULATORY_CARE_PROVIDER_SITE_OTHER): Payer: Medicare Other | Admitting: Internal Medicine

## 2019-10-27 ENCOUNTER — Encounter: Payer: Self-pay | Admitting: Internal Medicine

## 2019-10-27 ENCOUNTER — Other Ambulatory Visit (INDEPENDENT_AMBULATORY_CARE_PROVIDER_SITE_OTHER): Payer: Medicare Other

## 2019-10-27 VITALS — BP 148/92 | HR 62 | Temp 97.8°F | Ht 71.0 in | Wt 223.0 lb

## 2019-10-27 DIAGNOSIS — I10 Essential (primary) hypertension: Secondary | ICD-10-CM

## 2019-10-27 DIAGNOSIS — I5042 Chronic combined systolic (congestive) and diastolic (congestive) heart failure: Secondary | ICD-10-CM

## 2019-10-27 DIAGNOSIS — R0600 Dyspnea, unspecified: Secondary | ICD-10-CM

## 2019-10-27 DIAGNOSIS — I255 Ischemic cardiomyopathy: Secondary | ICD-10-CM

## 2019-10-27 DIAGNOSIS — I2510 Atherosclerotic heart disease of native coronary artery without angina pectoris: Secondary | ICD-10-CM | POA: Diagnosis not present

## 2019-10-27 LAB — BASIC METABOLIC PANEL
BUN: 18 mg/dL (ref 6–23)
CO2: 35 mEq/L — ABNORMAL HIGH (ref 19–32)
Calcium: 9.3 mg/dL (ref 8.4–10.5)
Chloride: 100 mEq/L (ref 96–112)
Creatinine, Ser: 1.01 mg/dL (ref 0.40–1.50)
GFR: 70.12 mL/min (ref >60.00)
Glucose, Bld: 120 mg/dL — ABNORMAL HIGH (ref 70–99)
Potassium: 4.1 mEq/L (ref 3.5–5.1)
Sodium: 141 mEq/L (ref 135–145)

## 2019-10-27 NOTE — Progress Notes (Signed)
Subjective:  Patient ID: Jon Baker, male    DOB: Jan 08, 1932  Age: 85 y.o. MRN: [id number]  CC: No chief complaint on file.   HPI Jon Baker presents for PF, hypoxia, HTN f/u  Outpatient Medications Prior to Visit  Medication Sig Dispense Refill  . amLODipine-valsartan (EXFORGE) 5-160 MG tablet Take 1 tablet by mouth daily. 90 tablet 3  . aspirin EC 81 MG tablet Take 1 tablet (81 mg total) by mouth daily. 100 tablet 3  . atorvastatin (LIPITOR) 80 MG tablet Take 1 tablet (80 mg total) by mouth daily at 6 PM. 90 tablet 3  . budesonide (PULMICORT) 0.25 MG/2ML nebulizer solution Take 2 mLs (0.25 mg total) by nebulization 2 (two) times a day. 360 mL 5  . carvedilol (COREG) 25 MG tablet Take 1 tablet (25 mg total) by mouth 2 (two) times daily with a meal. 180 tablet 3  . cholecalciferol (VITAMIN D) 1000 UNITS tablet Take 1,000 Units by mouth daily.    . clopidogrel (PLAVIX) 75 MG tablet TAKE 1 TABLET BY MOUTH EVERY DAY 90 tablet 0  . furosemide (LASIX) 40 MG tablet Take 1 tablet (40 mg total) by mouth 2 (two) times daily. 180 tablet 3  . guaiFENesin (MUCINEX) 600 MG 12 hr tablet Take 600 mg by mouth daily.    [name] ipratropium-albuterol (DUONEB) 0.5-2.5 (3) MG/3ML SOLN Take 3 mLs by nebulization every 6 (six) hours as needed. 360 mL 3  . nitroGLYCERIN (NITROSTAT) 0.4 MG SL tablet Place 1 tablet (0.4 mg total) under the tongue every 5 (five) minutes as needed for chest pain. 25 tablet 3  . potassium chloride SA (K-DUR) 20 MEQ tablet Take 4 tablets (80 mEq total) by mouth daily. 180 tablet 4  . predniSONE (DELTASONE) 10 MG tablet TAKE 1 TABLET BY MOUTH EVERY MORNING AFTER MEALS 90 tablet 1  . VENTOLIN HFA 108 (90 Base) MCG/ACT inhaler INHALE 2 PUFFS INTO THE LUNGS EVERY 6 HOURS AS NEEDED FOR WHEEZING/SHORTNESS OF BREATH 18 Inhaler 2   No facility-administered medications prior to visit.     ROS: Review of Systems  Constitutional: Positive for fatigue. Negative for appetite change,  fever and unexpected weight change.  HENT: Negative for congestion, nosebleeds, sneezing, sore throat and trouble swallowing.   Eyes: Negative for itching and visual disturbance.  Respiratory: Positive for shortness of breath. Negative for cough.   Cardiovascular: Negative for chest pain, palpitations and leg swelling.  Gastrointestinal: Negative for abdominal distention, blood in stool, diarrhea and nausea.  Genitourinary: Negative for frequency and hematuria.  Musculoskeletal: Negative for back pain, gait problem, joint swelling and neck pain.  Skin: Negative for rash.  Neurological: Positive for weakness. Negative for dizziness, tremors and speech difficulty.  Psychiatric/Behavioral: Negative for agitation, dysphoric mood, sleep disturbance and suicidal ideas. The patient is not nervous/anxious.     Objective:  BP (!) 148/92 (BP Location: Left Arm, Patient Position: Sitting, Cuff Size: Large)   Pulse 62   Temp 97.8 F (36.6 C) (Oral)   Ht 5\' 11"  (1.803 m)   Wt 223 lb (101.2 kg)   SpO2 98% Comment: 3 liters of o2  BMI 31.10 kg/m   BP Readings from Last 3 Encounters:  10/27/19 (!) 148/92  08/19/19 (!) 198/104  07/08/19 (!) 196/100    Wt Readings from Last 3 Encounters:  10/27/19 223 lb (101.2 kg)  08/19/19 219 lb (99.3 kg)  07/08/19 217 lb (98.4 kg)    Physical Exam Constitutional:      General:  He is not in acute distress.    Appearance: He is well-developed.     Comments: NAD  Eyes:     Conjunctiva/sclera: Conjunctivae normal.     Pupils: Pupils are equal, round, and reactive to light.  Neck:     Musculoskeletal: Normal range of motion.     Thyroid: No thyromegaly.     Vascular: No JVD.  Cardiovascular:     Rate and Rhythm: Normal rate and regular rhythm.     Heart sounds: Normal heart sounds. No murmur. No friction rub. No gallop.   Pulmonary:     Effort: Pulmonary effort is normal. No respiratory distress.     Breath sounds: Normal breath sounds. No wheezing  or rales.  Chest:     Chest wall: No tenderness.  Abdominal:     General: Bowel sounds are normal. There is no distension.     Palpations: Abdomen is soft. There is no mass.     Tenderness: There is no abdominal tenderness. There is no guarding or rebound.  Musculoskeletal: Normal range of motion.        General: No tenderness.     Right lower leg: No edema.     Left lower leg: No edema.  Lymphadenopathy:     Cervical: No cervical adenopathy.  Skin:    General: Skin is warm and dry.     Findings: No rash.  Neurological:     Mental Status: He is alert and oriented to person, place, and time.     Cranial Nerves: No cranial nerve deficit.     Motor: No abnormal muscle tone.     Coordination: Coordination abnormal.     Gait: Gait abnormal.     Deep Tendon Reflexes: Reflexes are normal and symmetric.  Psychiatric:        Behavior: Behavior normal.        Thought Content: Thought content normal.        Judgment: Judgment normal.   decreased BS at bases No edema O2 is on  Lab Results  Component Value Date   WBC 11.5 (H) 02/17/2018   HGB 14.2 02/17/2018   HCT 40.2 02/17/2018   PLT 286.0 02/17/2018   GLUCOSE 123 (H) 05/25/2019   CHOL 125 07/20/2015   TRIG 136.0 07/20/2015   HDL 25.40 (L) 07/20/2015   LDLCALC 72 07/20/2015   ALT 25 02/25/2019   AST 18 02/25/2019   NA 140 05/25/2019   K 3.2 (L) 05/25/2019   CL 95 (L) 05/25/2019   CREATININE 1.00 05/25/2019   BUN 15 05/25/2019   CO2 32 (H) 05/25/2019   TSH 2.24 02/25/2019   PSA 4.51 (H) 03/31/2015   INR 1.10 05/20/2015   HGBA1C 5.6 05/22/2015    Dg Chest 2 View  Result Date: 06/25/2019 CLINICAL DATA:  Increased dypsnea x 3 days. Hx of MI, HTN, COPD, asthma, CAD, stent. Ex smoker. EXAM: CHEST - 2 VIEW COMPARISON:  02/17/2018 FINDINGS: Cardiac silhouette is normal in size. No mediastinal or hilar masses. No evidence of adenopathy. Aortic atherosclerosis. Aorta is uncoiled and tortuous. Lungs are hyperexpanded mild  peripheral interstitial thickening in the lower lungs. No evidence of pneumonia or pulmonary edema. No pleural effusion or pneumothorax. Skeletal structures are intact. IMPRESSION: 1. No acute cardiopulmonary disease. 2. Stable changes of COPD. Electronically Signed   By: Amie Portland M.D.   On: 06/25/2019 08:58    Assessment & Plan:   There are no diagnoses linked to this encounter.   No orders  of the defined types were placed in this encounter.    Follow-up: No follow-ups on file.  Walker Kehr, MD

## 2019-10-31 NOTE — Assessment & Plan Note (Signed)
No angina.  On Plavix and aspirin.  Nitroglycerin as needed

## 2019-10-31 NOTE — Assessment & Plan Note (Signed)
Seems to be compensated clinically.  The patient refused blood work.  Continue with furosemide.  Monitor weights

## 2019-10-31 NOTE — Assessment & Plan Note (Signed)
Doing better on current regimen including oxygen

## 2019-11-03 ENCOUNTER — Other Ambulatory Visit: Payer: Self-pay

## 2019-12-16 ENCOUNTER — Other Ambulatory Visit: Payer: Self-pay | Admitting: Cardiovascular Disease

## 2019-12-31 ENCOUNTER — Other Ambulatory Visit: Payer: Self-pay | Admitting: Internal Medicine

## 2019-12-31 ENCOUNTER — Other Ambulatory Visit: Payer: Self-pay | Admitting: Cardiovascular Disease

## 2020-01-23 ENCOUNTER — Encounter (HOSPITAL_COMMUNITY): Payer: Self-pay | Admitting: Pulmonary Disease

## 2020-01-23 ENCOUNTER — Other Ambulatory Visit: Payer: Self-pay

## 2020-01-23 ENCOUNTER — Emergency Department (HOSPITAL_COMMUNITY): Payer: Medicare Other

## 2020-01-23 ENCOUNTER — Inpatient Hospital Stay (HOSPITAL_COMMUNITY): Payer: Medicare Other

## 2020-01-23 ENCOUNTER — Inpatient Hospital Stay (HOSPITAL_COMMUNITY)
Admission: EM | Admit: 2020-01-23 | Discharge: 2020-02-01 | DRG: 208 | Disposition: A | Discharge status: Skilled Nursing Facility | Payer: Medicare Other | Attending: Internal Medicine | Admitting: Internal Medicine

## 2020-01-23 DIAGNOSIS — I252 Old myocardial infarction: Secondary | ICD-10-CM | POA: Diagnosis not present

## 2020-01-23 DIAGNOSIS — I4510 Unspecified right bundle-branch block: Secondary | ICD-10-CM | POA: Diagnosis present

## 2020-01-23 DIAGNOSIS — A419 Sepsis, unspecified organism: Secondary | ICD-10-CM | POA: Diagnosis not present

## 2020-01-23 DIAGNOSIS — J9602 Acute respiratory failure with hypercapnia: Secondary | ICD-10-CM | POA: Diagnosis not present

## 2020-01-23 DIAGNOSIS — R404 Transient alteration of awareness: Secondary | ICD-10-CM | POA: Diagnosis not present

## 2020-01-23 DIAGNOSIS — Z9861 Coronary angioplasty status: Secondary | ICD-10-CM

## 2020-01-23 DIAGNOSIS — I248 Other forms of acute ischemic heart disease: Secondary | ICD-10-CM | POA: Diagnosis present

## 2020-01-23 DIAGNOSIS — N1831 Chronic kidney disease, stage 3a: Secondary | ICD-10-CM | POA: Diagnosis present

## 2020-01-23 DIAGNOSIS — J9621 Acute and chronic respiratory failure with hypoxia: Secondary | ICD-10-CM | POA: Diagnosis present

## 2020-01-23 DIAGNOSIS — I5043 Acute on chronic combined systolic (congestive) and diastolic (congestive) heart failure: Secondary | ICD-10-CM | POA: Diagnosis present

## 2020-01-23 DIAGNOSIS — F17200 Nicotine dependence, unspecified, uncomplicated: Secondary | ICD-10-CM | POA: Diagnosis present

## 2020-01-23 DIAGNOSIS — J42 Unspecified chronic bronchitis: Secondary | ICD-10-CM

## 2020-01-23 DIAGNOSIS — E785 Hyperlipidemia, unspecified: Secondary | ICD-10-CM | POA: Diagnosis present

## 2020-01-23 DIAGNOSIS — Z4659 Encounter for fitting and adjustment of other gastrointestinal appliance and device: Secondary | ICD-10-CM

## 2020-01-23 DIAGNOSIS — J61 Pneumoconiosis due to asbestos and other mineral fibers: Secondary | ICD-10-CM | POA: Diagnosis present

## 2020-01-23 DIAGNOSIS — I255 Ischemic cardiomyopathy: Secondary | ICD-10-CM | POA: Diagnosis present

## 2020-01-23 DIAGNOSIS — Z452 Encounter for adjustment and management of vascular access device: Secondary | ICD-10-CM | POA: Diagnosis not present

## 2020-01-23 DIAGNOSIS — R2681 Unsteadiness on feet: Secondary | ICD-10-CM | POA: Diagnosis not present

## 2020-01-23 DIAGNOSIS — N179 Acute kidney failure, unspecified: Secondary | ICD-10-CM | POA: Diagnosis present

## 2020-01-23 DIAGNOSIS — I2510 Atherosclerotic heart disease of native coronary artery without angina pectoris: Secondary | ICD-10-CM | POA: Diagnosis present

## 2020-01-23 DIAGNOSIS — I110 Hypertensive heart disease with heart failure: Secondary | ICD-10-CM | POA: Diagnosis present

## 2020-01-23 DIAGNOSIS — J439 Emphysema, unspecified: Secondary | ICD-10-CM | POA: Diagnosis present

## 2020-01-23 DIAGNOSIS — G9340 Encephalopathy, unspecified: Secondary | ICD-10-CM | POA: Diagnosis not present

## 2020-01-23 DIAGNOSIS — J9611 Chronic respiratory failure with hypoxia: Secondary | ICD-10-CM

## 2020-01-23 DIAGNOSIS — J1282 Pneumonia due to coronavirus disease 2019: Secondary | ICD-10-CM

## 2020-01-23 DIAGNOSIS — J9601 Acute respiratory failure with hypoxia: Secondary | ICD-10-CM

## 2020-01-23 DIAGNOSIS — J189 Pneumonia, unspecified organism: Secondary | ICD-10-CM

## 2020-01-23 DIAGNOSIS — I130 Hypertensive heart and chronic kidney disease with heart failure and stage 1 through stage 4 chronic kidney disease, or unspecified chronic kidney disease: Secondary | ICD-10-CM | POA: Diagnosis present

## 2020-01-23 DIAGNOSIS — R0602 Shortness of breath: Secondary | ICD-10-CM | POA: Diagnosis not present

## 2020-01-23 DIAGNOSIS — R319 Hematuria, unspecified: Secondary | ICD-10-CM | POA: Diagnosis present

## 2020-01-23 DIAGNOSIS — E874 Mixed disorder of acid-base balance: Secondary | ICD-10-CM | POA: Diagnosis present

## 2020-01-23 DIAGNOSIS — J9600 Acute respiratory failure, unspecified whether with hypoxia or hypercapnia: Secondary | ICD-10-CM

## 2020-01-23 DIAGNOSIS — Z79899 Other long term (current) drug therapy: Secondary | ICD-10-CM

## 2020-01-23 DIAGNOSIS — J449 Chronic obstructive pulmonary disease, unspecified: Secondary | ICD-10-CM | POA: Diagnosis not present

## 2020-01-23 DIAGNOSIS — Z7401 Bed confinement status: Secondary | ICD-10-CM | POA: Diagnosis not present

## 2020-01-23 DIAGNOSIS — Z66 Do not resuscitate: Secondary | ICD-10-CM | POA: Diagnosis not present

## 2020-01-23 DIAGNOSIS — R41841 Cognitive communication deficit: Secondary | ICD-10-CM | POA: Diagnosis not present

## 2020-01-23 DIAGNOSIS — L89151 Pressure ulcer of sacral region, stage 1: Secondary | ICD-10-CM | POA: Diagnosis present

## 2020-01-23 DIAGNOSIS — J432 Centrilobular emphysema: Secondary | ICD-10-CM | POA: Diagnosis not present

## 2020-01-23 DIAGNOSIS — J9690 Respiratory failure, unspecified, unspecified whether with hypoxia or hypercapnia: Secondary | ICD-10-CM | POA: Diagnosis not present

## 2020-01-23 DIAGNOSIS — R1312 Dysphagia, oropharyngeal phase: Secondary | ICD-10-CM | POA: Diagnosis not present

## 2020-01-23 DIAGNOSIS — R6521 Severe sepsis with septic shock: Secondary | ICD-10-CM

## 2020-01-23 DIAGNOSIS — U071 COVID-19: Principal | ICD-10-CM | POA: Diagnosis present

## 2020-01-23 DIAGNOSIS — R5381 Other malaise: Secondary | ICD-10-CM | POA: Diagnosis not present

## 2020-01-23 DIAGNOSIS — M6259 Muscle wasting and atrophy, not elsewhere classified, multiple sites: Secondary | ICD-10-CM | POA: Diagnosis not present

## 2020-01-23 DIAGNOSIS — I214 Non-ST elevation (NSTEMI) myocardial infarction: Secondary | ICD-10-CM | POA: Diagnosis present

## 2020-01-23 DIAGNOSIS — G9341 Metabolic encephalopathy: Secondary | ICD-10-CM | POA: Diagnosis present

## 2020-01-23 DIAGNOSIS — R0681 Apnea, not elsewhere classified: Secondary | ICD-10-CM | POA: Diagnosis not present

## 2020-01-23 DIAGNOSIS — Z7902 Long term (current) use of antithrombotics/antiplatelets: Secondary | ICD-10-CM

## 2020-01-23 DIAGNOSIS — M6281 Muscle weakness (generalized): Secondary | ICD-10-CM | POA: Diagnosis not present

## 2020-01-23 DIAGNOSIS — Z7952 Long term (current) use of systemic steroids: Secondary | ICD-10-CM

## 2020-01-23 DIAGNOSIS — Z9981 Dependence on supplemental oxygen: Secondary | ICD-10-CM

## 2020-01-23 DIAGNOSIS — I517 Cardiomegaly: Secondary | ICD-10-CM | POA: Diagnosis not present

## 2020-01-23 DIAGNOSIS — R278 Other lack of coordination: Secondary | ICD-10-CM | POA: Diagnosis not present

## 2020-01-23 DIAGNOSIS — F329 Major depressive disorder, single episode, unspecified: Secondary | ICD-10-CM | POA: Diagnosis present

## 2020-01-23 DIAGNOSIS — R0902 Hypoxemia: Secondary | ICD-10-CM | POA: Diagnosis not present

## 2020-01-23 DIAGNOSIS — L8990 Pressure ulcer of unspecified site, unspecified stage: Secondary | ICD-10-CM | POA: Insufficient documentation

## 2020-01-23 DIAGNOSIS — Z20822 Contact with and (suspected) exposure to covid-19: Secondary | ICD-10-CM | POA: Diagnosis not present

## 2020-01-23 DIAGNOSIS — R7989 Other specified abnormal findings of blood chemistry: Secondary | ICD-10-CM | POA: Diagnosis not present

## 2020-01-23 DIAGNOSIS — R2689 Other abnormalities of gait and mobility: Secondary | ICD-10-CM | POA: Diagnosis not present

## 2020-01-23 DIAGNOSIS — R4020 Unspecified coma: Secondary | ICD-10-CM | POA: Diagnosis not present

## 2020-01-23 DIAGNOSIS — M2550 Pain in unspecified joint: Secondary | ICD-10-CM | POA: Diagnosis not present

## 2020-01-23 DIAGNOSIS — Z4682 Encounter for fitting and adjustment of non-vascular catheter: Secondary | ICD-10-CM | POA: Diagnosis not present

## 2020-01-23 LAB — COMPREHENSIVE METABOLIC PANEL
ALT: 79 U/L — ABNORMAL HIGH (ref 0–44)
AST: 97 U/L — ABNORMAL HIGH (ref 15–41)
Albumin: 3.4 g/dL — ABNORMAL LOW (ref 3.5–5.0)
Alkaline Phosphatase: 74 U/L (ref 38–126)
Anion gap: 17 — ABNORMAL HIGH (ref 5–15)
BUN: 24 mg/dL — ABNORMAL HIGH (ref 8–23)
CO2: 21 mmol/L — ABNORMAL LOW (ref 22–32)
Calcium: 8.4 mg/dL — ABNORMAL LOW (ref 8.9–10.3)
Chloride: 100 mmol/L (ref 98–111)
Creatinine, Ser: 1.76 mg/dL — ABNORMAL HIGH (ref 0.61–1.24)
GFR calc Af Amer: 40 mL/min — ABNORMAL LOW (ref >60)
GFR calc non Af Amer: 34 mL/min — ABNORMAL LOW (ref >60)
Glucose, Bld: 91 mg/dL (ref 70–99)
Potassium: 4.8 mmol/L (ref 3.5–5.1)
Sodium: 138 mmol/L (ref 135–145)
Total Bilirubin: 1.6 mg/dL — ABNORMAL HIGH (ref 0.3–1.2)
Total Protein: 6.4 g/dL — ABNORMAL LOW (ref 6.5–8.1)

## 2020-01-23 LAB — URINALYSIS, ROUTINE W REFLEX MICROSCOPIC
Bacteria, UA: NONE SEEN
Bilirubin Urine: NEGATIVE
Glucose, UA: NEGATIVE mg/dL
Hgb urine dipstick: NEGATIVE
Ketones, ur: NEGATIVE mg/dL
Leukocytes,Ua: NEGATIVE
Nitrite: NEGATIVE
Protein, ur: 30 mg/dL — AB
Specific Gravity, Urine: 1.014 (ref 1.005–1.030)
pH: 7 (ref 5.0–8.0)

## 2020-01-23 LAB — STREP PNEUMONIAE URINARY ANTIGEN: Strep Pneumo Urinary Antigen: NEGATIVE

## 2020-01-23 LAB — RAPID URINE DRUG SCREEN, HOSP PERFORMED
Amphetamines: NOT DETECTED
Barbiturates: NOT DETECTED
Benzodiazepines: NOT DETECTED
Cocaine: NOT DETECTED
Opiates: NOT DETECTED
Tetrahydrocannabinol: NOT DETECTED

## 2020-01-23 LAB — POCT I-STAT EG7
Acid-base deficit: 1 mmol/L (ref 0.0–2.0)
Bicarbonate: 27.4 mmol/L (ref 20.0–28.0)
Calcium, Ion: 1.06 mmol/L — ABNORMAL LOW (ref 1.15–1.40)
HCT: 40 % (ref 39.0–52.0)
Hemoglobin: 13.6 g/dL (ref 13.0–17.0)
O2 Saturation: 87 %
Potassium: 3.2 mmol/L — ABNORMAL LOW (ref 3.5–5.1)
Sodium: 141 mmol/L (ref 135–145)
TCO2: 29 mmol/L (ref 22–32)
pCO2, Ven: 58.5 mmHg (ref 44.0–60.0)
pH, Ven: 7.279 (ref 7.250–7.430)
pO2, Ven: 61 mmHg — ABNORMAL HIGH (ref 32.0–45.0)

## 2020-01-23 LAB — LACTIC ACID, PLASMA
Lactic Acid, Venous: 3.8 mmol/L (ref 0.5–1.9)
Lactic Acid, Venous: 3.1 mmol/L (ref 0.5–1.9)

## 2020-01-23 LAB — RESPIRATORY PANEL BY RT PCR (FLU A&B, COVID)
Influenza A by PCR: NEGATIVE
Influenza B by PCR: NEGATIVE
SARS Coronavirus 2 by RT PCR: POSITIVE — AB

## 2020-01-23 LAB — CBC WITH DIFFERENTIAL/PLATELET
Abs Immature Granulocytes: 0.12 10*3/uL — ABNORMAL HIGH (ref 0.00–0.07)
Basophils Absolute: 0.1 10*3/uL (ref 0.0–0.1)
Basophils Relative: 1 %
Eosinophils Absolute: 0.1 10*3/uL (ref 0.0–0.5)
Eosinophils Relative: 1 %
HCT: 45.4 % (ref 39.0–52.0)
Hemoglobin: 14.7 g/dL (ref 13.0–17.0)
Immature Granulocytes: 1 %
Lymphocytes Relative: 8 %
Lymphs Abs: 0.9 10*3/uL (ref 0.7–4.0)
MCH: 33 pg (ref 26.0–34.0)
MCHC: 32.4 g/dL (ref 30.0–36.0)
MCV: 101.8 fL — ABNORMAL HIGH (ref 80.0–100.0)
Monocytes Absolute: 1.1 10*3/uL — ABNORMAL HIGH (ref 0.1–1.0)
Monocytes Relative: 9 %
Neutro Abs: 9.6 10*3/uL — ABNORMAL HIGH (ref 1.7–7.7)
Neutrophils Relative %: 80 %
Platelets: 200 10*3/uL (ref 150–400)
RBC: 4.46 MIL/uL (ref 4.22–5.81)
RDW: 13.1 % (ref 11.5–15.5)
WBC: 12 10*3/uL — ABNORMAL HIGH (ref 4.0–10.5)
nRBC: 0 % (ref 0.0–0.2)

## 2020-01-23 LAB — ETHANOL: Alcohol, Ethyl (B): <10 mg/dL (ref <10)

## 2020-01-23 LAB — TYPE AND SCREEN
ABO/RH(D): O NEG
Antibody Screen: NEGATIVE

## 2020-01-23 LAB — PROTIME-INR
INR: 1.2 (ref 0.8–1.2)
Prothrombin Time: 14.9 seconds (ref 11.4–15.2)

## 2020-01-23 LAB — TROPONIN I (HIGH SENSITIVITY)
Troponin I (High Sensitivity): 935 ng/L (ref <18)
Troponin I (High Sensitivity): 6712 ng/L (ref <18)

## 2020-01-23 LAB — AMMONIA: Ammonia: 47 umol/L — ABNORMAL HIGH (ref 9–35)

## 2020-01-23 LAB — ABO/RH: ABO/RH(D): O NEG

## 2020-01-23 LAB — GLUCOSE, CAPILLARY: Glucose-Capillary: 102 mg/dL — ABNORMAL HIGH (ref 70–99)

## 2020-01-23 LAB — CBG MONITORING, ED: Glucose-Capillary: 92 mg/dL (ref 70–99)

## 2020-01-23 LAB — ECHOCARDIOGRAM LIMITED: Height: 71 in

## 2020-01-23 LAB — BRAIN NATRIURETIC PEPTIDE: B Natriuretic Peptide: 470.3 pg/mL — ABNORMAL HIGH (ref 0.0–100.0)

## 2020-01-23 LAB — HEPARIN LEVEL (UNFRACTIONATED): Heparin Unfractionated: 0.11 IU/mL — ABNORMAL LOW (ref 0.30–0.70)

## 2020-01-23 MED ORDER — SODIUM CHLORIDE 0.9 % IV SOLN
500.0000 mg | Freq: Once | INTRAVENOUS | Status: AC
  Administered 2020-01-23: 500 mg via INTRAVENOUS
  Filled 2020-01-23: qty 500

## 2020-01-23 MED ORDER — SODIUM CHLORIDE 0.9 % IV SOLN
100.0000 mg | Freq: Every day | INTRAVENOUS | Status: AC
  Administered 2020-01-24 – 2020-01-27 (×4): 100 mg via INTRAVENOUS
  Filled 2020-01-23 (×5): qty 20

## 2020-01-23 MED ORDER — FENTANYL BOLUS VIA INFUSION
25.0000 ug | INTRAVENOUS | Status: DC | PRN
  Administered 2020-01-25 (×2): 25 ug via INTRAVENOUS
  Filled 2020-01-23: qty 25

## 2020-01-23 MED ORDER — NOREPINEPHRINE 4 MG/250ML-% IV SOLN
INTRAVENOUS | Status: AC
  Administered 2020-01-23: 2 ug/min via INTRAVENOUS
  Filled 2020-01-23: qty 250

## 2020-01-23 MED ORDER — ROCURONIUM BROMIDE 50 MG/5ML IV SOLN
INTRAVENOUS | Status: AC | PRN
  Administered 2020-01-23: 100 mg via INTRAVENOUS

## 2020-01-23 MED ORDER — SODIUM CHLORIDE 0.9 % IV SOLN
2.0000 g | Freq: Once | INTRAVENOUS | Status: AC
  Administered 2020-01-23: 2 g via INTRAVENOUS
  Filled 2020-01-23: qty 20

## 2020-01-23 MED ORDER — SODIUM CHLORIDE 0.9 % IV SOLN
500.0000 mg | Freq: Every day | INTRAVENOUS | Status: DC
  Administered 2020-01-24 – 2020-01-26 (×3): 500 mg via INTRAVENOUS
  Filled 2020-01-23 (×4): qty 500

## 2020-01-23 MED ORDER — ALBUTEROL SULFATE (2.5 MG/3ML) 0.083% IN NEBU
5.0000 mg | INHALATION_SOLUTION | Freq: Once | RESPIRATORY_TRACT | Status: AC
  Administered 2020-01-23: 13:00:00 5 mg via RESPIRATORY_TRACT
  Filled 2020-01-23: qty 6

## 2020-01-23 MED ORDER — CHLORHEXIDINE GLUCONATE 0.12% ORAL RINSE (MEDLINE KIT)
15.0000 mL | Freq: Two times a day (BID) | OROMUCOSAL | Status: DC
  Administered 2020-01-23 – 2020-02-01 (×13): 15 mL via OROMUCOSAL

## 2020-01-23 MED ORDER — FENTANYL CITRATE (PF) 100 MCG/2ML IJ SOLN
50.0000 ug | INTRAMUSCULAR | Status: DC | PRN
  Administered 2020-01-23: 50 ug via INTRAVENOUS
  Filled 2020-01-23: qty 2

## 2020-01-23 MED ORDER — HEPARIN (PORCINE) 25000 UT/250ML-% IV SOLN: 1150.0000 [IU]/h | INTRAVENOUS | Status: DC

## 2020-01-23 MED ORDER — FENTANYL CITRATE (PF) 100 MCG/2ML IJ SOLN
25.0000 ug | Freq: Once | INTRAMUSCULAR | Status: AC
  Administered 2020-01-24: 25 ug via INTRAVENOUS

## 2020-01-23 MED ORDER — NALOXONE HCL 2 MG/2ML IJ SOSY
PREFILLED_SYRINGE | INTRAMUSCULAR | Status: AC | PRN
  Administered 2020-01-23: 2 mg via INTRAVENOUS

## 2020-01-23 MED ORDER — ETOMIDATE 2 MG/ML IV SOLN
INTRAVENOUS | Status: AC | PRN
  Administered 2020-01-23: 30 mg via INTRAVENOUS

## 2020-01-23 MED ORDER — ACETAMINOPHEN 650 MG RE SUPP
650.0000 mg | Freq: Once | RECTAL | Status: AC
  Administered 2020-01-23: 14:00:00 650 mg via RECTAL
  Filled 2020-01-23: qty 1

## 2020-01-23 MED ORDER — ASPIRIN 325 MG PO TABS
325.0000 mg | ORAL_TABLET | Freq: Every day | ORAL | Status: DC
  Administered 2020-01-23 – 2020-02-01 (×10): 325 mg via ORAL
  Filled 2020-01-23 (×12): qty 1

## 2020-01-23 MED ORDER — SODIUM CHLORIDE 0.9 % IV BOLUS
1000.0000 mL | Freq: Once | INTRAVENOUS | Status: AC
  Administered 2020-01-23: 1000 mL via INTRAVENOUS

## 2020-01-23 MED ORDER — HEPARIN BOLUS VIA INFUSION
4000.0000 [IU] | Freq: Once | INTRAVENOUS | Status: DC
  Filled 2020-01-23: qty 4000

## 2020-01-23 MED ORDER — FENTANYL CITRATE (PF) 100 MCG/2ML IJ SOLN
50.0000 ug | INTRAMUSCULAR | Status: DC | PRN
  Administered 2020-01-23: 50 ug via INTRAVENOUS

## 2020-01-23 MED ORDER — METHYLPREDNISOLONE SODIUM SUCC 125 MG IJ SOLR
125.0000 mg | Freq: Once | INTRAMUSCULAR | Status: AC
  Administered 2020-01-23: 14:00:00 125 mg via INTRAVENOUS
  Filled 2020-01-23: qty 2

## 2020-01-23 MED ORDER — FENTANYL 2500MCG IN NS 250ML (10MCG/ML) PREMIX INFUSION
25.0000 ug/h | INTRAVENOUS | Status: DC
  Administered 2020-01-24: 25 ug/h via INTRAVENOUS
  Filled 2020-01-23: qty 250

## 2020-01-23 MED ORDER — DEXAMETHASONE SODIUM PHOSPHATE 10 MG/ML IJ SOLN
6.0000 mg | Freq: Every day | INTRAMUSCULAR | Status: DC
  Administered 2020-01-24 – 2020-01-27 (×4): 6 mg via INTRAVENOUS
  Filled 2020-01-23 (×5): qty 1

## 2020-01-23 MED ORDER — NALOXONE HCL 2 MG/2ML IJ SOSY
PREFILLED_SYRINGE | INTRAMUSCULAR | Status: AC
  Filled 2020-01-23: qty 2

## 2020-01-23 MED ORDER — ORAL CARE MOUTH RINSE
15.0000 mL | OROMUCOSAL | Status: DC
  Administered 2020-01-23 – 2020-01-26 (×18): 15 mL via OROMUCOSAL

## 2020-01-23 MED ORDER — CHLORHEXIDINE GLUCONATE CLOTH 2 % EX PADS
6.0000 | MEDICATED_PAD | Freq: Every day | CUTANEOUS | Status: DC
  Administered 2020-01-23 – 2020-01-30 (×7): 6 via TOPICAL

## 2020-01-23 MED ORDER — LACTATED RINGERS IV SOLN: INTRAVENOUS | Status: DC

## 2020-01-23 MED ORDER — FENTANYL CITRATE (PF) 100 MCG/2ML IJ SOLN: 50.0000 ug | INTRAMUSCULAR | Status: DC | PRN

## 2020-01-23 MED ORDER — IPRATROPIUM-ALBUTEROL 0.5-2.5 (3) MG/3ML IN SOLN: 3.0000 mL | Freq: Four times a day (QID) | RESPIRATORY_TRACT | Status: DC | PRN

## 2020-01-23 MED ORDER — DEXMEDETOMIDINE HCL IN NACL 400 MCG/100ML IV SOLN: 0.0000 ug/kg/h | INTRAVENOUS | Status: DC

## 2020-01-23 MED ORDER — FENTANYL CITRATE (PF) 100 MCG/2ML IJ SOLN
50.0000 ug | INTRAMUSCULAR | Status: DC | PRN
  Filled 2020-01-23: qty 2

## 2020-01-23 MED ORDER — MIDAZOLAM HCL 2 MG/2ML IJ SOLN: 1.0000 mg | INTRAMUSCULAR | Status: DC | PRN

## 2020-01-23 MED ORDER — NOREPINEPHRINE 4 MG/250ML-% IV SOLN: 0.0000 ug/min | INTRAVENOUS | Status: DC

## 2020-01-23 MED ORDER — SODIUM CHLORIDE 0.9 % IV SOLN
2.0000 g | Freq: Every day | INTRAVENOUS | Status: AC
  Administered 2020-01-24 – 2020-01-29 (×6): 2 g via INTRAVENOUS
  Filled 2020-01-23 (×6): qty 20

## 2020-01-23 MED ORDER — PANTOPRAZOLE SODIUM 40 MG IV SOLR
40.0000 mg | Freq: Every day | INTRAVENOUS | Status: DC
  Administered 2020-01-23 – 2020-01-25 (×3): 40 mg via INTRAVENOUS
  Filled 2020-01-23 (×3): qty 40

## 2020-01-23 MED ORDER — NOREPINEPHRINE 4 MG/250ML-% IV SOLN
INTRAVENOUS | Status: AC
  Administered 2020-01-23: 5 ug/kg/min
  Filled 2020-01-23: qty 250

## 2020-01-23 MED ORDER — ENOXAPARIN SODIUM 40 MG/0.4ML ~~LOC~~ SOLN
40.0000 mg | SUBCUTANEOUS | Status: DC
  Administered 2020-01-23: 40 mg via SUBCUTANEOUS
  Filled 2020-01-23: qty 0.4

## 2020-01-23 MED ORDER — SODIUM CHLORIDE 0.9 % IV SOLN
200.0000 mg | Freq: Once | INTRAVENOUS | Status: AC
  Administered 2020-01-23: 200 mg via INTRAVENOUS
  Filled 2020-01-23: qty 200

## 2020-01-23 MED ORDER — SODIUM CHLORIDE 0.9 % IV BOLUS
1000.0000 mL | Freq: Once | INTRAVENOUS | Status: AC
  Administered 2020-01-23: 15:00:00 1000 mL via INTRAVENOUS

## 2020-01-23 MED ORDER — PROPOFOL 1000 MG/100ML IV EMUL
INTRAVENOUS | Status: AC
  Filled 2020-01-23: qty 100

## 2020-01-23 MED ORDER — ACETAMINOPHEN 650 MG RE SUPP
650.0000 mg | Freq: Once | RECTAL | Status: AC
  Administered 2020-01-23: 14:00:00 650 mg via RECTAL

## 2020-01-23 NOTE — ED Provider Notes (Signed)
[facility] EMERGENCY DEPARTMENT Provider Note   CSN: [id number] Arrival date & time: 01/23/20  1249  LEVEL 5  CAVEAT - UNRESPONSIVE History No chief complaint on file.   Jon Baker is a 86 y.o. male.  HPI 86 year old male presents with respiratory failure and altered mental state.  History is limited and mostly by EMS at first and later by family.  Patient was found unresponsive in his chair this morning.  At first they thought he was sleeping as he often does in the chair but later he was still not responsive.  He wears oxygen chronically for COPD.  He also has CHF and does not always take his fluid pills.  Patient was reportedly fine yesterday and had a Covid vaccine a couple days ago.  EMS reports he was agonal breathing but his respiratory rate has improved recently but he is still unresponsive.  No past medical history on file.  There are no problems to display for this patient.        No family history on file.  Social History   Tobacco Use  . Smoking status: Not on file  Substance Use Topics  . Alcohol use: Not on file  . Drug use: Not on file    Home Medications Prior to Admission medications   Medication Sig Start Date End Date Taking? Authorizing Provider  albuterol (VENTOLIN HFA) 108 (90 Base) MCG/ACT inhaler Inhale 2 puffs into the lungs every 6 (six) hours as needed for wheezing or shortness of breath.   Yes [provider]  clopidogrel (PLAVIX) 75 MG tablet Take 75 mg by mouth daily.   Yes [provider]  ipratropium-albuterol (DUONEB) 0.5-2.5 (3) MG/3ML SOLN Take 3 mLs by nebulization every 6 (six) hours as needed (shortness of breath/wheezing).   Yes [provider]  predniSONE (DELTASONE) 10 MG tablet Take 10 mg by mouth daily. 01/22/20   [provider]    Allergies    Patient has no known allergies.  Review of Systems   Review of Systems  Unable to perform ROS: Patient unresponsive     Physical Exam Updated Vital Signs BP 94/73   Pulse 71   Temp (!) 102.5 F (39.2 C)   Resp 16   Ht 5\' 11"  (1.803 m)   SpO2 100%   Physical Exam Vitals and nursing note reviewed.  Constitutional:      Appearance: He is well-developed. He is obese. He is ill-appearing.  HENT:     Head: Normocephalic and atraumatic.     Right Ear: External ear normal.     Left Ear: External ear normal.     Nose: Nose normal.  Eyes:     General:        Right eye: No discharge.        Left eye: No discharge.     Comments: Pupils small to mid-sized  Cardiovascular:     Rate and Rhythm: Normal rate and regular rhythm.     Heart sounds: Normal heart sounds.  Pulmonary:     Effort: Tachypnea and accessory muscle usage present.     Breath sounds: Decreased breath sounds and wheezing present.     Comments: Patient has shallow respirations with increased RR (~30) Abdominal:     Palpations: Abdomen is soft.     Tenderness: There is no abdominal tenderness.  Musculoskeletal:     Cervical back: Neck supple.     Right lower leg: Edema present.     Left lower  leg: Edema present.     Comments: pitting edema to BLE.   Skin:    General: Skin is warm and dry.  Neurological:     Mental Status: He is unresponsive.     GCS: GCS eye subscore is 1. GCS verbal subscore is 1. GCS motor subscore is 1.     Comments: Patient occasionally seems to have some posturing to bilateral arms, symmetric. Does not respond to painful stimuli  Psychiatric:        Mood and Affect: Mood is not anxious.     ED Results / Procedures / Treatments   Labs (all labs ordered are listed, but only abnormal results are displayed) Labs Reviewed  LACTIC ACID, PLASMA - Abnormal; Notable for the following components:      Result Value   Lactic Acid, Venous 3.8 (*)    All other components within normal limits  BRAIN NATRIURETIC PEPTIDE - Abnormal; Notable for the following components:   B Natriuretic Peptide 470.3 (*)    All  other components within normal limits  CBC WITH DIFFERENTIAL/PLATELET - Abnormal; Notable for the following components:   WBC 12.0 (*)    MCV 101.8 (*)    Neutro Abs 9.6 (*)    Monocytes Absolute 1.1 (*)    Abs Immature Granulocytes 0.12 (*)    All other components within normal limits  URINALYSIS, ROUTINE W REFLEX MICROSCOPIC - Abnormal; Notable for the following components:   Protein, ur 30 (*)    All other components within normal limits  CULTURE, BLOOD (ROUTINE X 2)  CULTURE, BLOOD (ROUTINE X 2)  RESPIRATORY PANEL BY RT PCR (FLU A&B, COVID)  RAPID URINE DRUG SCREEN, HOSP PERFORMED  ETHANOL  COMPREHENSIVE METABOLIC PANEL  LACTIC ACID, PLASMA  PROTIME-INR  AMMONIA  CBG MONITORING, ED  TYPE AND SCREEN  TROPONIN I (HIGH SENSITIVITY)    EKG EKG Interpretation  Date/Time:  Sunday January 23 2020 13:14:57 EST Ventricular Rate:  74 PR Interval:    QRS Duration: 151 QT Interval:  407 QTC Calculation: 452 R Axis:   34 Text Interpretation: Sinus rhythm Right bundle branch block no significant change since earlier in the day Confirmed by Pricilla Loveless [phone]) on 01/23/2020 1:24:39 PM   Radiology DG Chest Portable 1 View  Result Date: 01/23/2020 CLINICAL DATA:  Respiratory failure EXAM: PORTABLE CHEST 1 VIEW COMPARISON:  June 24, 2019 FINDINGS: Endotracheal tube is identified distal tip 6.3 cm from carina. Increased pulmonary interstitium is identified in the bilateral upper lobes and right mid lung. There is no pleural effusion. The aorta is tortuous. The heart size is upper limits are normal. A feeding tube is identified with distal tip not included on film. IMPRESSION: Increased pulmonary interstitium in the bilateral upper lobes and right mid lung, findings are suspicious for developing pneumonias. Electronically Signed   By: Sherian Rein M.D.   On: 01/23/2020 13:59    Procedures Procedure Name: Intubation Date/Time: 01/23/2020 2:27 PM Performed by: Pricilla Loveless,  MD Pre-anesthesia Checklist: Patient identified, Patient being monitored, Emergency Drugs available, Timeout performed and Suction available Oxygen Delivery Method: Non-rebreather mask Preoxygenation: Pre-oxygenation with 100% oxygen Induction Type: Rapid sequence Ventilation: Mask ventilation without difficulty Laryngoscope Size: Glidescope and 3 Grade View: Grade I Tube size: 7.5 mm Number of attempts: 1 Airway Equipment and Method: Video-laryngoscopy Placement Confirmation: ETT inserted through vocal cords under direct vision,  CO2 detector and Breath sounds checked- equal and bilateral Secured at: 23 cm Dental Injury: Teeth and Oropharynx as per pre-operative  assessment      .Critical Care Performed by: Sherwood Gambler, MD Authorized by: Sherwood Gambler, MD   Critical care provider statement:    Critical care time (minutes):  55   Critical care time was exclusive of:  Separately billable procedures and treating other patients   Critical care was necessary to treat or prevent imminent or life-threatening deterioration of the following conditions:  Respiratory failure and sepsis   Critical care was time spent personally by me on the following activities:  Discussions with consultants, evaluation of patient's response to treatment, examination of patient, ordering and performing treatments and interventions, ordering and review of laboratory studies, ordering and review of radiographic studies, pulse oximetry, re-evaluation of patient's condition, obtaining history from patient or surrogate and review of old charts   (including critical care time)  Medications Ordered in ED Medications  fentaNYL (SUBLIMAZE) injection 50 mcg (has no administration in time range)  fentaNYL (SUBLIMAZE) injection 50 mcg (has no administration in time range)  midazolam (VERSED) injection 1 mg (has no administration in time range)  midazolam (VERSED) injection 1 mg (has no administration in time range)   azithromycin (ZITHROMAX) 500 mg in sodium chloride 0.9 % 250 mL IVPB (500 mg Intravenous New Bag/Given 01/23/20 1352)  naloxone (NARCAN) injection ( Intravenous Canceled Entry 01/23/20 1315)  etomidate (AMIDATE) injection (30 mg Intravenous Given 01/23/20 1257)  rocuronium (ZEMURON) injection (100 mg Intravenous Given 01/23/20 1258)  propofol (DIPRIVAN) 1000 MG/100ML infusion (  Paused 01/23/20 1320)  albuterol (PROVENTIL) (2.5 MG/3ML) 0.083% nebulizer solution 5 mg (5 mg Nebulization Given by Other 01/23/20 1329)  sodium chloride 0.9 % bolus 1,000 mL (1,000 mLs Intravenous New Bag/Given 01/23/20 1325)  cefTRIAXone (ROCEPHIN) 2 g in sodium chloride 0.9 % 100 mL IVPB (2 g Intravenous New Bag/Given 01/23/20 1351)  methylPREDNISolone sodium succinate (SOLU-MEDROL) 125 mg/2 mL injection 125 mg (125 mg Intravenous Given 01/23/20 1355)  norepinephrine (LEVOPHED) 4-5 MG/250ML-% infusion SOLN (  Paused 01/23/20 1359)  acetaminophen (TYLENOL) suppository 650 mg (650 mg Rectal Given 01/23/20 1342)  acetaminophen (TYLENOL) suppository 650 mg (650 mg Rectal Given 01/23/20 1355)    ED Course  I have reviewed the triage vital signs and the nursing notes.  Pertinent labs & imaging results that were available during my care of the patient were reviewed by me and considered in my medical decision making (see chart for details).    MDM Rules/Calculators/A&P                      Patient was intubated shortly after arrival as he is not protecting his airway.  My suspicion is he has elevated CO2 though there is difficulty getting an ABG so we will get a VBG.  He otherwise is found to be febrile.  He was treated for pneumonia with Rocephin/azithromycin.  Dropped his blood pressure after propofol had been started, was placed on Levophed and propofol stopped.  Maintaining blood pressures but then dropped again so Levophed placed again.  He is being given IV fluids.  There is probably a congestive heart failure component but at  the same time he likely is volume down with his acute kidney injury as well.  Likely a sepsis component.  I discussed with family including wife and they understand that he is quite ill.  Would like for him to be full code at this time.  Discussed with Eliseo Gum of ICU for admission.  Covid testing currently pending.  Jon Baker was evaluated in  Emergency Department on 01/23/2020 for the symptoms described in the history of present illness. He was evaluated in the context of the global COVID-19 pandemic, which necessitated consideration that the patient might be at risk for infection with the SARS-CoV-2 virus that causes COVID-19. Institutional protocols and algorithms that pertain to the evaluation of patients at risk for COVID-19 are in a state of rapid change based on information released by regulatory bodies including the CDC and federal and state organizations. These policies and algorithms were followed during the patient's care in the ED.  Final Clinical Impression(s) / ED Diagnoses Final diagnoses:  Septic shock (HCC)  Acute respiratory failure, unspecified whether with hypoxia or hypercapnia [facility])    Rx / DC Orders ED Discharge Orders    None       Pricilla Loveless, MD 01/23/20 1542

## 2020-01-23 NOTE — H&P (Signed)
NAME:  Jon Baker, MRN:  [id number], DOB:  16-May-1932, LOS: 0 ADMISSION DATE:  01/23/2020, CONSULTATION DATE:  01/23/20 REFERRING MD:  Criss Alvine - EM, CHIEF COMPLAINT:  Acute respiratory failure, AMS  Brief History   86 yo M with hx Severe COPD and ILD presents after being found unresponsive in chair History of present illness   86 yo M PMH ILD, COPD (followed by Dr. Delton Coombes PCCM), CAD, systolic and diastolic CHF who presents to ED today after being found unresponsive. Found by wife 2/14 unresponsive, with pulse and spontaneously breathing. Received COVID vaccination a few days ago. Febrile to 102.7 in ED. Intubated by EDP for GCS 3; hypotensive following intubation requiring levophed.   Ct H pending COVID-19 pending   Past Medical History  COPD ILD CAD Systolic and diastolic HF  HTN   Significant Hospital Events   2/14 intubated in ED   Consults:    Procedures:  2/14 ETT>   Significant Diagnostic Tests:  2/14 CT H>>>   CT head negative for any significant finding  Chest x-ray showing bilateral upper lobe infiltrates Micro Data:  2/14 SARS Cov2 >> 2/14 Flu A/B >>   Antimicrobials:  Ceftriaxone 2/14>  Azithromycin 2/14>   Interim history/subjective:  Intubated in ED Hypotensive following intubation  Requiring pressors  Objective   Blood pressure (!) 85/69, pulse 65, temperature (!) 102 F (38.9 C), resp. rate 14, height 5\' 11"  (1.803 m), SpO2 98 %.    Vent Mode: PRVC FiO2 (%):  [100 %] 100 % Set Rate:  [16 bmp] 16 bmp Vt Set:  [600 mL] 600 mL PEEP:  [5 cmH20] 5 cmH20 Plateau Pressure:  [24 cmH20] 24 cmH20   Intake/Output Summary (Last 24 hours) at 01/23/2020 1510 Last data filed at 01/23/2020 1452 Gross per 24 hour  Intake 1330.31 ml  Output -  Net 1330.31 ml   There were no vitals filed for this visit.  Examination: General: Elderly gentleman, does not appear to be in respiratory distress HENT: Moist oral mucosa, pupils about 1 mm, mildly reactive  Lungs: Clear breath sounds, rhonchi upper lobes Cardiovascular: S1-S2 appreciated Abdomen: Soft, bowel sounds appreciated Extremities: No clubbing, does have bilateral edema Neuro: Unresponsive, does not follow commands, minimal response to pain GU:   Resolved Hospital Problem list     Assessment & Plan:  Encephalopathy -Cause is unclear at the present time -May be secondary to sepsis -May be secondary to medications  Acute hypoxemic respiratory failure Underlying chronic respiratory failure Continue mechanical ventilation  Target TVol 6-8cc/kgIBW Ventilator associated pneumonia prevention protocol  Multilobar pneumonia Community-acquired pneumonia -Follow cultures -Continue current antibiotic therapy -Send urine Legionella -Urine pneumococcus  Anion gap metabolic acidosis, respiratory alkalosis Lactic acidosis  Elevated troponin -Likely related to demand ischemia  History of congestive heart failure -We will get echocardiogram to assess cardiac function -BNP of 400s  History of chronic respiratory failure on oxygen History of obstructive lung disease -Records not available   Apparently received Covid vaccination 2 days ago, Covid testing pending at present  Admit to ICU  Best practice:  Diet: Tube feeding once tube confirmed Pain/Anxiety/Delirium protocol (if indicated): Fentanyl and Precedex VAP protocol (if indicated): In place DVT prophylaxis: SCD, Lovenox GI prophylaxis: Protonix Glucose control:  Mobility: Bedrest Code Status: Full code Family Communication: Will update Disposition:icu   Labs   CBC: Recent Labs  Lab 01/23/20 1319  WBC 12.0*  NEUTROABS 9.6*  HGB 14.7  HCT 45.4  MCV 101.8*  PLT 200  Basic Metabolic Panel: Recent Labs  Lab 01/23/20 1319  NA 138  K 4.8  CL 100  CO2 21*  GLUCOSE 91  BUN 24*  CREATININE 1.76*  CALCIUM 8.4*   GFR: CrCl cannot be calculated (Unknown ideal weight.). Recent Labs  Lab 01/23/20  1319 01/23/20 1343  WBC 12.0*  --   LATICACIDVEN  --  3.8*    Liver Function Tests: Recent Labs  Lab 01/23/20 1319  AST 97*  ALT 79*  ALKPHOS 74  BILITOT 1.6*  PROT 6.4*  ALBUMIN 3.4*   No results for input(s): LIPASE, AMYLASE in the last 168 hours. Recent Labs  Lab 01/23/20 1343  AMMONIA 47*    ABG No results found for: PHART, PCO2ART, PO2ART, HCO3, TCO2, ACIDBASEDEF, O2SAT   Coagulation Profile: Recent Labs  Lab 01/23/20 1319  INR 1.2    Cardiac Enzymes: No results for input(s): CKTOTAL, CKMB, CKMBINDEX, TROPONINI in the last 168 hours.  HbA1C: No results found for: HGBA1C  CBG: Recent Labs  Lab 01/23/20 1255  GLUCAP 92    Review of Systems:   Unobtainable  Past Medical History  He,  has no past medical history on file.   Surgical History      Social History      Family History   His family history is not on file.   Allergies No Known Allergies   Home Medications  Prior to Admission medications   Medication Sig Start Date End Date Taking? Authorizing Provider  albuterol (VENTOLIN HFA) 108 (90 Base) MCG/ACT inhaler Inhale 2 puffs into the lungs every 6 (six) hours as needed for wheezing or shortness of breath.   Yes [provider]  aspirin EC 81 MG tablet Take 81 mg by mouth daily.   Yes [provider]  atorvastatin (LIPITOR) 80 MG tablet Take 80 mg by mouth at bedtime. 12/25/19  Yes [provider]  budesonide (PULMICORT) 0.25 MG/2ML nebulizer solution Take 0.25 mg by nebulization 2 (two) times daily.   Yes [provider]  carvedilol (COREG) 25 MG tablet Take 25 mg by mouth 2 (two) times daily. 12/25/19  Yes [provider]  cholecalciferol (VITAMIN D3) 25 MCG (1000 UNIT) tablet Take 1,000 Units by mouth daily.   Yes [provider]  clopidogrel (PLAVIX) 75 MG tablet Take 75 mg by mouth daily at 6 PM.    Yes [provider]  guaiFENesin (MUCINEX) 600 MG 12 hr tablet Take 600  mg by mouth daily.   Yes [provider]  ipratropium-albuterol (DUONEB) 0.5-2.5 (3) MG/3ML SOLN Take 3 mLs by nebulization every 6 (six) hours as needed (shortness of breath/wheezing).   Yes [provider]  losartan (COZAAR) 100 MG tablet Take 100 mg by mouth daily. 12/31/19  Yes [provider]  predniSONE (DELTASONE) 10 MG tablet Take 10 mg by mouth daily. 01/22/20  Yes [provider]  Pseudoeph-Doxylamine-DM-APAP (NYQUIL PO) Take 30 mLs by mouth at bedtime.   Yes [provider]  furosemide (LASIX) 40 MG tablet Take 40 mg by mouth.    [provider]  potassium chloride SA (KLOR-CON) 20 MEQ tablet Take 20 mEq by mouth.    [provider]     The patient is critically ill with multiple organ systems failure and requires high complexity decision making for assessment and support, frequent evaluation and titration of therapies, application of advanced monitoring technologies and extensive interpretation of multiple databases. Critical Care Time devoted to patient care services described in  this note independent of APP/resident time (if applicable)  is 32 minutes.   Virl Diamond MD [facility] Personal pager: [phone] If unanswered, please page CCM On-call: [phone]

## 2020-01-23 NOTE — ED Triage Notes (Signed)
Pt LSN last night, was found this morning with agonal respirations and unresponsive in the recliner by family prior to calling EMS. Pt being bagged on arrival to ED.

## 2020-01-23 NOTE — ED Notes (Signed)
Patient clothing, wallet, and gold colored ring given to wife.

## 2020-01-23 NOTE — Progress Notes (Signed)
RRT unable to obtain post intubation ABG notified Pricilla Loveless, MD

## 2020-01-23 NOTE — Progress Notes (Addendum)
Covid positive  Increasing troponin likely consistent with myocardial infarction  Will start on heparin  Hypotension precludes beta-blockade  Will consult cardiology

## 2020-01-23 NOTE — Progress Notes (Signed)
Stopped by patient's bedside  Significantly bloody urine noted  Will discontinue heparin  Remdesivir/Decadron

## 2020-01-23 NOTE — Progress Notes (Signed)
eLink Physician-Brief Progress Note Patient Name: Kala Gassmann DOB: 12/14/1931 MRN: [id number]   Date of Service  01/23/2020  HPI/Events of Note  Called regarding bradycardia in this patient who is 82M with history of prior anterior STEMI in 2016 s/p pLAD stent, severe COPD and ILD, who is now admitted with COVID pneumonia and is intubated for this reason.  Review of telemetry shows sinus brady in mid 50s. Reportedly has been as low as mid-to-high 40s. When stimulated/awake, the patient's HR appropriately increases to 60-80 bpm (sinus rhythm).  He has a significantly elevated troponin consistent with demand ischemia / NSTEMI in the setting of his critical illness. Cardiology saw the patient this afternoon and came to the same assessment. They did not recommend urgent cardiac catheterization for this reason.   eICU Interventions  Some component of this patient's bradycardia may be related to his NSTEMI, though this may or may not be the case especially since his HR picks up to ~70bpm when awake.  The focus of his treatment right now, as recommended by cardiology, should be stabilization of his respiratory status and medical management of NSTEMI (incl. Heparin drip which he is on).  Other changes: - I will order continuous low-dose fentanyl drip for sedation, with fentanyl pushes PRN.     Intervention Category Intermediate Interventions: Arrhythmia - evaluation and management  Marveen Reeks Jacarri Gesner 01/23/2020, 11:11 PM

## 2020-01-23 NOTE — Progress Notes (Signed)
Pharmacy Antibiotic Note  Jon Baker is a 86 y.o. male admitted on 01/23/2020 with acute respiratory failure, AMS.  Pharmacy has been consulted for remdesivir dosing.  Other medical problems include underlying chronic respiratory failure (on oxygen), CAP, anion gap metabolic acidosis, respiratory acidosis, lactic acidosis, elevated troponin, hx HF, hx obstructive lung disease  WBC 12.0, Tmax 102.7, ALT/AST 79/97  Plan: Remdesivir 200 mg IV X 1, followed by remdesivir 100 mg IV daily X 4 doses Monitor WBC, temp, clinical improvement, daily LFTs, cultures  Height: 5\' 11"  (180.3 cm) Weight: 235 lb 7.2 oz (106.8 kg) IBW/kg (Calculated) : 75.3  Temp (24hrs), Avg:101.6 F (38.7 C), Min:96.6 F (35.9 C), Max:102.7 F (39.3 C)  Recent Labs  Lab 01/23/20 1319 01/23/20 1343 01/23/20 1536  WBC 12.0*  --   --   CREATININE 1.76*  --   --   LATICACIDVEN  --  3.8* 3.1*    Estimated Creatinine Clearance: 38.2 mL/min (A) (by C-G formula based on SCr of 1.76 mg/dL (H)).    No Known Allergies  Antimicrobials this admission: 2/14 azithromycin >> 2/14 ceftriaxone >> . Microbiology results: 2/14 BCx X 2: pending 2/14 flu A, flu B: negative 2/14 COVID: positive  Thank you for allowing pharmacy to be a part of this patient's care.  3/14, PharmD, BCPS, [facility] Clinical Pharmacist 01/23/2020 6:46 PM

## 2020-01-23 NOTE — Consult Note (Addendum)
Cardiology Consultation:   Patient ID: Jon Baker MRN: [id number]; DOB: July 07, 1932  Admit date: 01/23/2020 Date of Consult: 01/23/2020  Primary Care Provider: Cassandria Anger, MD Primary Cardiologist: Jay Schlichter Electrophysiologist:  None   *This is an e-consult done due to pt's COVID positivity; pt is intubated and sedated at this time and I was unable to speak with him directly. History obtained from notes by ED and primary team  Patient Profile:   Jon Baker is a 86 y.o. male with a hx of CAD, s/p PCI to LAD in the past, recent drop in LVEF who is being seen today for the evaluation of abnormal troponin at the request of PCCM (Dr. Ander Slade).  History of Present Illness:   Jon Baker has h/o CAD, s/p anterior STEMI on 05/20/15; He was taken to the cardiac catheterization laboratory by Dr. Burt Knack where he had a 99% proximal LAD lesion. Went aspiration thrombectomy and stenting using a drug-eluting stent. Otherwise he had noncritical CAD with an anteroapical wall motion and LV, and ejection fraction of 35-40%. His Brilenta was ultimately changed to Plavix because of symptoms of shortness of breath..  A 2-D echocardiogram performed 08/29/15 revealed improvement in his ejection fraction up to 60% without wall motion abnormalities.However a repeat 2D echo performed 06/02/2019 revealed a decline in EF from 40 to 40 to 45% with elevated left atrial pressures.  Dr. Gwenlyn Found wanted Jon Baker to undergo right and left heart cath however he declined this in July 2020. He is currently admitted after being found unresponsive at home; he is COVID positive and febrile to 102.7 in the ED. He required intubation due to respiratory failure and levophed for hemodynamic support.  We are called to see pt due to abnormal HS troponin, which has gone from 935-->6712.   Heart Pathway Score:     Past Medical History:  Diagnosis Date  . COPD (chronic obstructive pulmonary disease) (HCC)    ILD CAD ICM, most recent LVEF 40-45% HTN dyslipidemia  History reviewed. No pertinent surgical history.   Home Medications:  Prior to Admission medications   Medication Sig Start Date End Date Taking? Authorizing Provider  albuterol (VENTOLIN HFA) 108 (90 Base) MCG/ACT inhaler Inhale 2 puffs into the lungs every 6 (six) hours as needed for wheezing or shortness of breath.   Yes [provider]  aspirin EC 81 MG tablet Take 81 mg by mouth daily.   Yes [provider]  atorvastatin (LIPITOR) 80 MG tablet Take 80 mg by mouth at bedtime. 12/25/19  Yes [provider]  budesonide (PULMICORT) 0.25 MG/2ML nebulizer solution Take 0.25 mg by nebulization 2 (two) times daily.   Yes [provider]  carvedilol (COREG) 25 MG tablet Take 25 mg by mouth 2 (two) times daily. 12/25/19  Yes [provider]  cholecalciferol (VITAMIN D3) 25 MCG (1000 UNIT) tablet Take 1,000 Units by mouth daily.   Yes [provider]  clopidogrel (PLAVIX) 75 MG tablet Take 75 mg by mouth daily at 6 PM.    Yes [provider]  guaiFENesin (MUCINEX) 600 MG 12 hr tablet Take 600 mg by mouth daily.   Yes [provider]  ipratropium-albuterol (DUONEB) 0.5-2.5 (3) MG/3ML SOLN Take 3 mLs by nebulization every 6 (six) hours as needed (shortness of breath/wheezing).   Yes [provider]  losartan (COZAAR) 100 MG tablet Take 100 mg by mouth daily. 12/31/19  Yes [provider]  predniSONE (DELTASONE) 10 MG tablet Take 10 mg by mouth  daily. 01/22/20  Yes [provider]  Pseudoeph-Doxylamine-DM-APAP (NYQUIL PO) Take 30 mLs by mouth at bedtime.   Yes [provider]  furosemide (LASIX) 40 MG tablet Take 40 mg by mouth.    [provider]  potassium chloride SA (KLOR-CON) 20 MEQ tablet Take 20 mEq by mouth.    [provider]    Inpatient Medications: Scheduled Meds: . aspirin  325 mg Oral Daily  .  chlorhexidine gluconate (MEDLINE KIT)  15 mL Mouth Rinse BID  . Chlorhexidine Gluconate Cloth  6 each Topical Daily  . heparin  4,000 Units Intravenous Once  . mouth rinse  15 mL Mouth Rinse 10 times per day  . pantoprazole (PROTONIX) IV  40 mg Intravenous QHS   Continuous Infusions: . [START ON 01/24/2020] azithromycin (ZITHROMAX) 500 MG IVPB (Vial-Mate [employer])    . [START ON 01/24/2020] cefTRIAXone (ROCEPHIN)  IV    . dexmedetomidine (PRECEDEX) IV infusion    . heparin Stopped (01/23/20 1833)  . lactated ringers 100 mL/hr at 01/23/20 1800  . norepinephrine (LEVOPHED) Adult infusion 2 mcg/min (01/23/20 1836)   PRN Meds: fentaNYL (SUBLIMAZE) injection, fentaNYL (SUBLIMAZE) injection, fentaNYL (SUBLIMAZE) injection, ipratropium-albuterol, midazolam, midazolam  Allergies:   No Known Allergies  Social History:   Social History   Socioeconomic History  . Marital status: Married    Spouse name: Not on file  . Number of children: Not on file  . Years of education: Not on file  . Highest education level: Not on file  Occupational History  . Not on file  Tobacco Use  . Smoking status: Not on file  Substance and Sexual Activity  . Alcohol use: Not on file  . Drug use: Not on file  . Sexual activity: Not on file  Other Topics Concern  . Not on file  Social History Narrative  . Not on file   Social Determinants of Health   Financial Resource Strain:   . Difficulty of Paying Living Expenses: Not on file  Food Insecurity:   . Worried About Charity fundraiser in the Last Year: Not on file  . Ran Out of Food in the Last Year: Not on file  Transportation Needs:   . Lack of Transportation (Medical): Not on file  . Lack of Transportation (Non-Medical): Not on file  Physical Activity:   . Days of Exercise per Week: Not on file  . Minutes of Exercise per Session: Not on file  Stress:   . Feeling of Stress : Not on file  Social Connections:   . Frequency of Communication with  Friends and Family: Not on file  . Frequency of Social Gatherings with Friends and Family: Not on file  . Attends Religious Services: Not on file  . Active Member of Clubs or Organizations: Not on file  . Attends Archivist Meetings: Not on file  . Marital Status: Not on file  Intimate Partner Violence:   . Fear of Current or Ex-Partner: Not on file  . Emotionally Abused: Not on file  . Physically Abused: Not on file  . Sexually Abused: Not on file    Family History:   No family history on file.  FHx non-contributory  ROS:  Please see the history of present illness.  Unable to obtain due to pt intubated/sedated     Physical Exam/Data:   Vitals:   01/23/20 1645 01/23/20 1700 01/23/20 1715 01/23/20 1800  BP: 100/74 109/84 (!) 127/92 126/85  Pulse: 63 63 63  66  Resp: _0 Temp: (!) 100.6 F (38.1 C) (!) 100.4 F (38 C) 100.3 F (37.9 C)   TempSrc:      SpO2: 100% 100% 100% 99%  Weight:    106.8 kg  Height:    _1  (1.803 m)    Intake/Output Summary (Last 24 hours) at 01/23/2020 1840 Last data filed at 01/23/2020 1800 Gross per 24 hour  Intake 2514.68 ml  Output 400 ml  Net 2114.68 ml   Last 3 Weights 01/23/2020  Weight (lbs) 235 lb 7.2 oz  Weight (kg) 106.8 kg     Exam not performed; this consult done via phone  EKG:  The EKG was personally reviewed and demonstrates:  NSR with RBBB; no change from July 2020 Telemetry:  Telemetry was personally reviewed and demonstrates:  NSR  Relevant CV Studies: TTE 06-02-19 1. The left ventricle has mild-moderately reduced systolic function, with  an ejection fraction of 40-45%. The cavity size was normal. There is  mildly increased left ventricular wall thickness. Left ventricular  diastolic Doppler parameters are consistent  with impaired relaxation. Elevated mean left atrial pressure.  2. The tricuspid valve is grossly normal.  3. The aortic valve is tricuspid. Moderate calcification of the aortic   valve. Aortic valve regurgitation is trivial by color flow Doppler. Mild  stenosis of the aortic valve.  4. There is mild dilatation of the ascending aorta measuring 40 mm.  5. The inferior vena cava was dilated in size with >50% respiratory  variability.   TTE 01-23-20 1. Left ventricular ejection fraction, by estimation, is 35 to 40%. The  left ventricle has moderately decreased function. The left ventricle  demonstrates regional wall motion abnormalities (see scoring  diagram/findings for description). The left  ventricular internal cavity size was mildly dilated. Left ventricular  diastolic parameters are consistent with Grade I diastolic dysfunction  (impaired relaxation). There is moderate hypokinesis of the left  ventricular, mid-apical anteroseptal wall,  anterior wall and apical segment.  2. Right ventricular systolic function is mildly reduced. The right  ventricular size is mildly enlarged. There is mildly elevated pulmonary  artery systolic pressure.  3. Left atrial size was mildly dilated.  4. The mitral valve is normal in structure and function. No evidence of  mitral valve regurgitation.  5. The aortic valve is tricuspid. Aortic valve regurgitation is not  visualized. Mild to moderate aortic valve sclerosis/calcification is  present, without any evidence of aortic stenosis.  6. The inferior vena cava is dilated in size with <50% respiratory  variability, suggesting right atrial pressure of 15 mmHg. This is a  nonspecific finding during positive pressure ventilation.   05-20-15 LHC FINAL CONCLUSIONS:  TOTAL OCCLUSION OF THE PROXIMAL LAD TREATED SUCCESSFULLY WITH PRIMARY PCI USING A DRUG-ELUTING STENT WITH ADJUNCTIVE BALLOON ANGIOPLASTY AND ASPIRATION THROMBECTOMY  NONOBSTRUCTIVE LCX AND RCA STENOSES  MODERATELY SEVERE SEGMENTAL LV SYSTOLIC DYSFUNCTION   Laboratory Data:  High Sensitivity Troponin:   Recent Labs  Lab 01/23/20 1319 01/23/20 1536   TROPONINIHS 935* 6,712*     Chemistry Recent Labs  Lab 01/23/20 1319 01/23/20 1545  NA 138 141  K 4.8 3.2*  CL 100  --   CO2 21*  --   GLUCOSE 91  --   BUN 24*  --   CREATININE 1.76*  --   CALCIUM 8.4*  --   GFRNONAA 34*  --   GFRAA 40*  --   ANIONGAP 17*  --  Recent Labs  Lab 01/23/20 1319  PROT 6.4*  ALBUMIN 3.4*  AST 97*  ALT 79*  ALKPHOS 74  BILITOT 1.6*   Hematology Recent Labs  Lab 01/23/20 1319 01/23/20 1545  WBC 12.0*  --   RBC 4.46  --   HGB 14.7 13.6  HCT 45.4 40.0  MCV 101.8*  --   MCH 33.0  --   MCHC 32.4  --   RDW 13.1  --   PLT 200  --    BNP Recent Labs  Lab 01/23/20 1319  BNP 470.3*    DDimer No results for input(s): DDIMER in the last 168 hours.   Radiology/Studies:  DG Abd 1 View  Result Date: 01/23/2020 CLINICAL DATA:  Check feeding catheter placement EXAM: ABDOMEN - 1 VIEW COMPARISON:  None. FINDINGS: Gastric catheter is noted with the tip in the distal stomach. Scattered large and small bowel gas is noted. IMPRESSION: Gastric catheter within the distal stomach. Electronically Signed   By: Inez Catalina M.D.   On: 01/23/2020 16:38   CT Head Wo Contrast  Result Date: 01/23/2020 CLINICAL DATA:  Encephalopathy, unresponsive EXAM: CT HEAD WITHOUT CONTRAST TECHNIQUE: Contiguous axial images were obtained from the base of the skull through the vertex without intravenous contrast. COMPARISON:  07/30/2010 FINDINGS: Brain: Confluent hypodensities throughout the periventricular white matter are compatible with age-indeterminate small vessel ischemic changes, favor chronic. No other signs of acute infarct or hemorrhage. Lateral ventricles and remaining midline structures are unremarkable. No acute extra-axial fluid collections. No mass effect. Vascular: No hyperdense vessel or unexpected calcification. Skull: Normal. Negative for fracture or focal lesion. Sinuses/Orbits: Mucoperiosteal thickening is seen within the bilateral ethmoid air cells.  Other: None IMPRESSION: 1. Likely chronic small-vessel ischemic changes throughout the white matter. 2. No acute intracranial process otherwise. Electronically Signed   By: Randa Ngo M.D.   On: 01/23/2020 15:02   DG Chest Portable 1 View  Result Date: 01/23/2020 CLINICAL DATA:  Respiratory failure EXAM: PORTABLE CHEST 1 VIEW COMPARISON:  June 24, 2019 FINDINGS: Endotracheal tube is identified distal tip 6.3 cm from carina. Increased pulmonary interstitium is identified in the bilateral upper lobes and right mid lung. There is no pleural effusion. The aorta is tortuous. The heart size is upper limits are normal. A feeding tube is identified with distal tip not included on film. IMPRESSION: Increased pulmonary interstitium in the bilateral upper lobes and right mid lung, findings are suspicious for developing pneumonias. Electronically Signed   By: Abelardo Diesel M.D.   On: 01/23/2020 13:59   ECHOCARDIOGRAM LIMITED  Result Date: 01/23/2020    ECHOCARDIOGRAM REPORT   Patient Name:   TAMMIE ELLSWORTH Date of Exam: 01/23/2020 Medical Rec #:  [id number]       Height:       71.0 in Accession #:    [id number]      Weight: Date of Birth:  1932-06-09       BSA: Patient Age:    87 years        BP:           99/74 mmHg Patient Gender: M               HR:           62 bpm. Exam Location:  Inpatient Procedure: Limited Echo, Cardiac Doppler and Limited Color Doppler Indications:    Cardiomegaly 429.3  History:        Patient has no prior history of Echocardiogram examinations.  CHF, CAD; COPD and Covid.  Sonographer:    Johny Chess Referring Phys: [id number] [place]  1. Left ventricular ejection fraction, by estimation, is 35 to 40%. The left ventricle has moderately decreased function. The left ventricle demonstrates regional wall motion abnormalities (see scoring diagram/findings for description). The left ventricular internal cavity size was mildly dilated. Left ventricular  diastolic parameters are consistent with Grade I diastolic dysfunction (impaired relaxation). There is moderate hypokinesis of the left ventricular, mid-apical anteroseptal wall, anterior wall and apical segment.  2. Right ventricular systolic function is mildly reduced. The right ventricular size is mildly enlarged. There is mildly elevated pulmonary artery systolic pressure.  3. Left atrial size was mildly dilated.  4. The mitral valve is normal in structure and function. No evidence of mitral valve regurgitation.  5. The aortic valve is tricuspid. Aortic valve regurgitation is not visualized. Mild to moderate aortic valve sclerosis/calcification is present, without any evidence of aortic stenosis.  6. The inferior vena cava is dilated in size with <50% respiratory variability, suggesting right atrial pressure of 15 mmHg. This is a nonspecific finding during positive pressure ventilation. FINDINGS  Left Ventricle: Left ventricular ejection fraction, by estimation, is 35 to 40%. The left ventricle has moderately decreased function. The left ventricle demonstrates regional wall motion abnormalities. Moderate hypokinesis of the left ventricular, mid-apical anteroseptal wall, anterior wall and apical segment. The left ventricular internal cavity size was mildly dilated. There is no left ventricular hypertrophy. Left ventricular diastolic parameters are consistent with Grade I diastolic dysfunction (impaired relaxation). Normal left ventricular filling pressure.  LV Wall Scoring: The mid and distal anterior wall, mid anteroseptal segment, and apex are hypokinetic. Right Ventricle: The right ventricular size is mildly enlarged. No increase in right ventricular wall thickness. Right ventricular systolic function is mildly reduced. There is mildly elevated pulmonary artery systolic pressure. The tricuspid regurgitant  velocity is 1.94 m/s, and with an assumed right atrial pressure of 15 mmHg, the estimated right  ventricular systolic pressure is 64.6 mmHg. Left Atrium: Left atrial size was mildly dilated. Right Atrium: Right atrial size was not well visualized. Pericardium: There is no evidence of pericardial effusion. Mitral Valve: The mitral valve is normal in structure and function. No evidence of mitral valve regurgitation. Tricuspid Valve: The tricuspid valve is normal in structure. Tricuspid valve regurgitation is not demonstrated. Aortic Valve: The aortic valve is tricuspid. Aortic valve regurgitation is not visualized. Mild to moderate aortic valve sclerosis/calcification is present, without any evidence of aortic stenosis. Pulmonic Valve: The pulmonic valve was not well visualized. Pulmonic valve regurgitation is not visualized. Aorta: The aortic root is normal in size and structure. Venous: The inferior vena cava is dilated in size with less than 50% respiratory variability, suggesting right atrial pressure of 15 mmHg. IAS/Shunts: No atrial level shunt detected by color flow Doppler.  LEFT VENTRICLE PLAX 2D LVIDd:         5.90 cm  Diastology LVIDs:         4.80 cm  LV e' lateral:   4.13 cm/s LV PW:         0.80 cm  LV E/e' lateral: 11.0 LV IVS:        1.10 cm  LV e' medial:    4.13 cm/s LVOT diam:     2.20 cm  LV E/e' medial:  11.0 LVOT Area:     3.80 cm  LEFT ATRIUM LA diam:    3.40 cm   AORTA Ao Root  diam: 3.60 cm MITRAL VALVE               TRICUSPID VALVE MV Area (PHT): 3.60 cm    TR Peak grad:   15.1 mmHg MV Decel Time: 211 msec    TR Vmax:        194.00 cm/s MV E velocity: 45.40 cm/s MV A velocity: 87.40 cm/s  SHUNTS MV E/A ratio:  0.52        Systemic Diam: 2.20 cm Mihai Croitoru MD Electronically signed by Sanda Klein MD Signature Date/Time: 01/23/2020/5:37:49 PM    Final        TIMI Risk Score for Unstable Angina or Non-ST Elevation MI:   The patient's TIMI risk score is 5, which indicates a 26% risk of all cause mortality, new or recurrent myocardial infarction or need for urgent revascularization  in the next 14 days.   Assessment and Plan:   1. Abn trop/NSTEMI: pt has known CAD, s/p PCI to LAD in 2016 and w/ recent decrease in LVEF noted in July 2020. LHC recommended at that time, but pt declined. There is no h/o obvious recent anginal sx, but pt has severe COPD and any cardiac sx may have been masked. He is currently COVID +, intubated for respiratory failure likely COVID-related, w/o any acute ST changes. Trop leak likely demand-induced, on top of underlying (possibly obstructive) CAD. Agree w/ heparin IV gtt if tolerated (pt's RN mentions he has hematuria that may prevent therapeutic anti-coagulation at this time), but no indication for urgent LHC at present. Cont supportive medical tx. TTE shows LVEF relatively stable from July 2020. Will plan for further invasive cardiac evaluation once pt more stable.  2. Acute respiratory failure/COVID: mgmt as per primary team  3. HTN/dyslipidemia: pt currently requiring pressor support. Hold home meds as appropriate.     For questions or updates, please contact [place] Please consult [email or web address] for contact info under  Total time spent on this e-consult 60 min.   Signed, Rudean Curt, MD  01/23/2020 6:40 PM

## 2020-01-23 NOTE — Progress Notes (Signed)
  Echocardiogram 2D Echocardiogram has been performed.  Jon Baker 01/23/2020, 5:33 PM

## 2020-01-23 NOTE — Progress Notes (Addendum)
Heparin was not started due to bloody urine. Will hold off on Ac at this time per MD.  ANTICOAGULATION CONSULT NOTE   Pharmacy Consult for Heparin Indication: chest pain/ACS  No Known Allergies  Patient Measurements: Height: 5\' 11"  (180.3 cm) IBW/kg (Calculated) : 75.3 Heparin Dosing Weight: 97.9kg  Vital Signs: Temp: 100.3 F (37.9 C) (02/14 1715) Temp Source: Tympanic (02/14 1258) BP: 127/92 (02/14 1715) Pulse Rate: 63 (02/14 1715)  Labs: Recent Labs    01/23/20 1319 01/23/20 1536 01/23/20 1545  HGB 14.7  --  13.6  HCT 45.4  --  40.0  PLT 200  --   --   LABPROT 14.9  --   --   INR 1.2  --   --   CREATININE 1.76*  --   --   TROPONINIHS 935* 6,712*  --     CrCl cannot be calculated (Unknown ideal weight.).   Medications:  Scheduled:  . aspirin  325 mg Oral Daily  . enoxaparin (LOVENOX) injection  40 mg Subcutaneous Q24H  . pantoprazole (PROTONIX) IV  40 mg Intravenous QHS    Assessment: Patient is a 76 yom that presented to the ED today after being found down and non responsive. After stabilizing the patient and obtaining labs he was found to have an elevated Trop and on repeat Trop increased significantly. Pharmacy has been asked to dose heparin for ACS in this patient.   Goal of Therapy:  Heparin level 0.3-0.7 units/ml Monitor platelets by anticoagulation protocol: Yes   Plan:  - Heparin 4000 units IV x 1 dose  - Heparin drip @ 1150 units/hr  - Heparin level in ~ 6 hours - Monitor patient for s/s of bleeding and cbc while on heparin  83 PharmD. BCPS  01/23/2020,5:41 PM

## 2020-01-24 DIAGNOSIS — U071 COVID-19: Principal | ICD-10-CM

## 2020-01-24 DIAGNOSIS — I214 Non-ST elevation (NSTEMI) myocardial infarction: Secondary | ICD-10-CM

## 2020-01-24 LAB — POCT I-STAT 7, (LYTES, BLD GAS, ICA,H+H)
Bicarbonate: 25.2 mmol/L (ref 20.0–28.0)
Calcium, Ion: 1.09 mmol/L — ABNORMAL LOW (ref 1.15–1.40)
HCT: 35 % — ABNORMAL LOW (ref 39.0–52.0)
Hemoglobin: 11.9 g/dL — ABNORMAL LOW (ref 13.0–17.0)
O2 Saturation: 98 %
Patient temperature: 98.8
Potassium: 3.3 mmol/L — ABNORMAL LOW (ref 3.5–5.1)
Sodium: 139 mmol/L (ref 135–145)
TCO2: 26 mmol/L (ref 22–32)
pCO2 arterial: 41.1 mmHg (ref 32.0–48.0)
pH, Arterial: 7.395 (ref 7.350–7.450)
pO2, Arterial: 115 mmHg — ABNORMAL HIGH (ref 83.0–108.0)

## 2020-01-24 LAB — CBC
HCT: 39 % (ref 39.0–52.0)
Hemoglobin: 12.8 g/dL — ABNORMAL LOW (ref 13.0–17.0)
MCH: 32.4 pg (ref 26.0–34.0)
MCHC: 32.8 g/dL (ref 30.0–36.0)
MCV: 98.7 fL (ref 80.0–100.0)
Platelets: 146 10*3/uL — ABNORMAL LOW (ref 150–400)
RBC: 3.95 MIL/uL — ABNORMAL LOW (ref 4.22–5.81)
RDW: 12.9 % (ref 11.5–15.5)
WBC: 19.4 10*3/uL — ABNORMAL HIGH (ref 4.0–10.5)
nRBC: 0 % (ref 0.0–0.2)

## 2020-01-24 LAB — COMPREHENSIVE METABOLIC PANEL
ALT: 109 U/L — ABNORMAL HIGH (ref 0–44)
AST: 146 U/L — ABNORMAL HIGH (ref 15–41)
Albumin: 2.8 g/dL — ABNORMAL LOW (ref 3.5–5.0)
Alkaline Phosphatase: 58 U/L (ref 38–126)
Anion gap: 16 — ABNORMAL HIGH (ref 5–15)
BUN: 31 mg/dL — ABNORMAL HIGH (ref 8–23)
CO2: 22 mmol/L (ref 22–32)
Calcium: 7.8 mg/dL — ABNORMAL LOW (ref 8.9–10.3)
Chloride: 103 mmol/L (ref 98–111)
Creatinine, Ser: 1.69 mg/dL — ABNORMAL HIGH (ref 0.61–1.24)
GFR calc Af Amer: 42 mL/min — ABNORMAL LOW (ref >60)
GFR calc non Af Amer: 36 mL/min — ABNORMAL LOW (ref >60)
Glucose, Bld: 133 mg/dL — ABNORMAL HIGH (ref 70–99)
Potassium: 3.5 mmol/L (ref 3.5–5.1)
Sodium: 141 mmol/L (ref 135–145)
Total Bilirubin: 1 mg/dL (ref 0.3–1.2)
Total Protein: 5.3 g/dL — ABNORMAL LOW (ref 6.5–8.1)

## 2020-01-24 LAB — HEPARIN LEVEL (UNFRACTIONATED): Heparin Unfractionated: 0.13 IU/mL — ABNORMAL LOW (ref 0.30–0.70)

## 2020-01-24 LAB — GLUCOSE, CAPILLARY
Glucose-Capillary: 124 mg/dL — ABNORMAL HIGH (ref 70–99)
Glucose-Capillary: 124 mg/dL — ABNORMAL HIGH (ref 70–99)
Glucose-Capillary: 137 mg/dL — ABNORMAL HIGH (ref 70–99)

## 2020-01-24 LAB — MAGNESIUM: Magnesium: 1.9 mg/dL (ref 1.7–2.4)

## 2020-01-24 NOTE — Progress Notes (Signed)
Assisted family with video/camera time via elink 

## 2020-01-24 NOTE — Progress Notes (Addendum)
    Chart reviewed.  Issues with hematuria and Foley causing heparin to be stopped.   Echo shows: Left ventricular ejection fraction, by estimation, is 35 to 40%. The  left ventricle has moderately decreased function. The left ventricle  demonstrates regional wall motion abnormalities (see scoring  diagram/findings for description). The left  ventricular internal cavity size was mildly dilated. Left ventricular  diastolic parameters are consistent with Grade I diastolic dysfunction  (impaired relaxation). There is moderate hypokinesis of the left  ventricular, mid-apical anteroseptal wall,  anterior wall and apical segment.  2. Right ventricular systolic function is mildly reduced. The right  ventricular size is mildly enlarged. There is mildly elevated pulmonary  artery systolic pressure.  3. Left atrial size was mildly dilated.  4. The mitral valve is normal in structure and function. No evidence of  mitral valve regurgitation.  5. The aortic valve is tricuspid. Aortic valve regurgitation is not  visualized. Mild to moderate aortic valve sclerosis/calcification is  present, without any evidence of aortic stenosis.  6. The inferior vena cava is dilated in size with <50% respiratory  variability, suggesting right atrial pressure of 15 mmHg. This is a  nonspecific finding during positive pressure ventilation.   Given MI in the setting of COVID, ILD and advanced age, patient's mortality risk is high.  Not a candidate for invasive cardiac testing at this time.  Supportive therapy.  If he recovers and his bleeding issues resolve, could consider further w/u.  Discussed with Dr. Delton Coombes.  There was some question of the patient's wishes as well regarding aggressive treatment.  Plans currently are to not escalate care, and they may soon move to comfort care.  We will be available if questions arise.   Corky Crafts, MD

## 2020-01-24 NOTE — Progress Notes (Signed)
Assisted tele visit to patient with family member.  Jennilyn Esteve M, RN  

## 2020-01-24 NOTE — Progress Notes (Signed)
NAME:  Jon Baker, MRN:  [id number], DOB:  Apr 24, 1932, LOS: 1 ADMISSION DATE:  01/23/2020, CONSULTATION DATE:  01/23/20 REFERRING MD:  Criss Alvine - EM, CHIEF COMPLAINT:  Acute respiratory failure, AMS  Brief History   86 yo M with hx Severe COPD and ILD presents after being found unresponsive in chair History of present illness   86 yo M PMH ILD, COPD (followed by Dr. Delton Coombes PCCM), CAD, systolic and diastolic CHF who presents to ED today after being found unresponsive. Found by wife 2/14 unresponsive, with pulse and spontaneously breathing. Received COVID vaccination a few days ago. Febrile to 102.7 in ED. Intubated by EDP for GCS 3; hypotensive following intubation requiring levophed.   Past Medical History  COPD ILD CAD Systolic and diastolic HF  HTN   Significant Hospital Events   2/14 intubated in ED   Consults:    Procedures:  2/14 ETT>   Significant Diagnostic Tests:  2/14 CT Head >>> chronic small vessel changes, nothing acute Echocardiogram 2/14 >> LVEF 35-40% with regional wall motion abnormalities, grade 1 diastolic dysfunction, mildly reduced RV function and mildly elevated PASP  Micro Data:  2/14 SARS Cov2 >> positive 2/14 Flu A/B >> both negative Blood 2/14 >>   Antimicrobials:  Remdesivir 2/14 > Dexamethasone 2/14 > ------------------- Ceftriaxone 2/14>  Azithromycin 2/14>   Interim history/subjective:  Started on heparin 2/14 for stress non-STEMI.  Developed significant hematuria with clots and Foley catheter obstruction requiring flushing.  Heparin stopped. FiO2 0.50, PEEP 5 Only sedation is fentanyl drip, currently 50 I/O+ 3 L  Objective   Blood pressure 124/83, pulse (!) 46, temperature 99.2 F (37.3 C), temperature source Oral, resp. rate 18, height 5\' 11"  (1.803 m), weight 108.8 kg, SpO2 98 %.    Vent Mode: PRVC FiO2 (%):  [50 %-100 %] 50 % Set Rate:  [16 bmp-18 bmp] 18 bmp Vt Set:  [500 mL-600 mL] 500 mL PEEP:  [5 cmH20] 5 cmH20 Plateau  Pressure:  [15 cmH20-24 cmH20] 15 cmH20   Intake/Output Summary (Last 24 hours) at 01/24/2020 01/26/2020 Last data filed at 01/24/2020 0600 Gross per 24 hour  Intake 4025.14 ml  Output 1000 ml  Net 3025.14 ml   Filed Weights   01/23/20 1800 01/24/20 0500  Weight: 106.8 kg 108.8 kg    Examination: General: Elderly man, intubated, lightly sedated, no respiratory distress HENT: ET tube in good position, no oral lesions or secretions Lungs: Distant, no wheezing, upper zone rhonchi Cardiovascular: Regular, bradycardic 50s, no murmur Abdomen: Nondistended, positive bowel sounds Extremities: 1+ lower extremity pitting edema Neuro: Wakes to voice, tried to speak, did not follow commands some spontaneous movement of his upper extremities  Resolved Hospital Problem list     Assessment & Plan:  Encephalopathy, presumed due to acute infectious illness, toxic metabolic, sepsis PAD protocol currently fentanyl alone.  Minimize sedation as able Treat underlying sepsis  Acute hypoxemic respiratory failure due to bilateral pulmonary infiltrates, COVID-19 pneumonia, and altered mental status Underlying chronic respiratory failure due to COPD, ILD Mechanical ventilation via ARDS protocol, target PRVC 6 cc/kg Wean PEEP and FiO2 as able Goal plateau pressure less than 30, driving pressure less than 15 Paralytics not currently indicated given his gas exchange Consider cycle prone positioning if increasing PEEP Fio2 needs Sedation goal RASS -2 Diuresis as blood pressure and renal function can tolerate, goal CVP 5-8 VAP prevention order set Remdesivir plan for 5 days Dexamethasone plan for 10 days  Multilobar pneumonia Possible community-acquired pneumonia Follow respiratory  culture data, blood cultures Continue empiric azithromycin, ceftriaxone for now Follow procalcitonin Pneumococcal antigen negative, Legionella antigen pending  History of CAD with Non-ST elevation MI, presumed due to stress  from infectious process, respiratory failure. Chronic ischemic cardiomyopathy, echocardiogram as above Appreciate cardiology assistance.  No indication for urgent catheterization, need to treat underlying process Heparin initiated, discontinued due to significant hematuria ASA if he can tolerate, plan to continue.  Plavix on hold Home carvedilol on hold Dose diuretics daily, goal O > I if renal function and blood pressure will tolerate  Acute renal failure (S Cr 1.01 in November 2020) Follow urine output and BMP Diuresis when renal function stabilizes  History of hypertension Hold losartan, furosemide (dose daily)  Anion gap metabolic acidosis, respiratory alkalosis Lactic acidosis, 3.8 > 3.1 Follow BMP, lactate for resolution with treatment of underlying process  Significant hematuria Heparin stopped Flushing Foley catheter and now with effective output.  Defer removal or placement of three-way catheter to avoid any further urethral trauma.  Increase flushes to 200 cc every hour for the next 6 hours. Consider urology consultation if flushes do not clear Follow CBC  Apparent chronic prednisone use New discussed with family, question why he is on this Need to consider adrenal insufficiency given his critical illness depending on course   Best practice:  Diet: Tube feeding once tube confirmed Pain/Anxiety/Delirium protocol (if indicated): Fentanyl  VAP protocol (if indicated): In place DVT prophylaxis: SCD GI prophylaxis: Protonix Glucose control: SSI Mobility: Bedrest Code Status: DNR Family Communication: I spoke with the patient's wife and daughter by phone on 2/15.  Explained his current status current level of support and prognosis.  They informed me that he does have a living will that places limitations on his care.  In fact his wife was not sure that he would have wanted to have been intubated in the first place.  We agreed that we would not escalate his care from here,  placed a DNR orders.  Further we discussed how to proceed.  Family is in favor of very short-term mechanical ventilation (2 to 3 days) to see if he can improve and off to meet criteria for successful extubation.  If he does so then they would not want him reintubated.  Alternatively if he does not improve enough to meet criteria for possible successful extubation then they would want to transition to a withdrawal of care and comfort. Disposition:icu   Labs   CBC: Recent Labs  Lab 01/23/20 1319 01/23/20 1545 01/24/20 0333 01/24/20 0459  WBC 12.0*  --  19.4*  --   NEUTROABS 9.6*  --   --   --   HGB 14.7 13.6 12.8* 11.9*  HCT 45.4 40.0 39.0 35.0*  MCV 101.8*  --  98.7  --   PLT 200  --  146*  --     Basic Metabolic Panel: Recent Labs  Lab 01/23/20 1319 01/23/20 1545 01/24/20 0333 01/24/20 0459  NA 138 141 141 139  K 4.8 3.2* 3.5 3.3*  CL 100  --  103  --   CO2 21*  --  22  --   GLUCOSE 91  --  133*  --   BUN 24*  --  31*  --   CREATININE 1.76*  --  1.69*  --   CALCIUM 8.4*  --  7.8*  --   MG  --   --  1.9  --    GFR: Estimated Creatinine Clearance: 40.1 mL/min (A) (by C-G  formula based on SCr of 1.69 mg/dL (H)). Recent Labs  Lab 01/23/20 1319 01/23/20 1343 01/23/20 1536 01/24/20 0333  WBC 12.0*  --   --  19.4*  LATICACIDVEN  --  3.8* 3.1*  --     Liver Function Tests: Recent Labs  Lab 01/23/20 1319 01/24/20 0333  AST 97* 146*  ALT 79* 109*  ALKPHOS 74 58  BILITOT 1.6* 1.0  PROT 6.4* 5.3*  ALBUMIN 3.4* 2.8*   No results for input(s): LIPASE, AMYLASE in the last 168 hours. Recent Labs  Lab 01/23/20 1343  AMMONIA 47*    ABG    Component Value Date/Time   PHART 7.395 01/24/2020 0459   PCO2ART 41.1 01/24/2020 0459   PO2ART 115.0 (H) 01/24/2020 0459   HCO3 25.2 01/24/2020 0459   TCO2 26 01/24/2020 0459   ACIDBASEDEF 1.0 01/23/2020 1545   O2SAT 98.0 01/24/2020 0459     Coagulation Profile: Recent Labs  Lab 01/23/20 1319  INR 1.2    Cardiac  Enzymes: No results for input(s): CKTOTAL, CKMB, CKMBINDEX, TROPONINI in the last 168 hours.  HbA1C: No results found for: HGBA1C  CBG: Recent Labs  Lab 01/23/20 1255 01/23/20 1810 01/24/20 0759  GLUCAP 92 102* 124*    The patient is critically ill with multiple organ systems failure and requires high complexity decision making for assessment and support, frequent evaluation and titration of therapies, application of advanced monitoring technologies and extensive interpretation of multiple databases. Critical Care Time devoted to patient care services described in this note independent of APP/resident time (if applicable)  is 40 minutes.    Levy Pupa, MD, PhD 01/24/2020, 10:23 AM [facility] [phone] or if no answer [phone]

## 2020-01-24 NOTE — Progress Notes (Signed)
eLink Physician-Brief Progress Note Patient Name: Zevin Nevares DOB: 10-21-1932 MRN: [id number]   Date of Service  01/24/2020  HPI/Events of Note  Upon replacement of foley catheter, a large amount of liquid blood mixed with clots was evacuated.  Manual bladder irrigation was begun with normal saline. The output with each subsequent irrigation/dwell was lighter pink.   eICU Interventions  - Will refrain from changing foley out to 3-way foley due to risk of further trauma and hemorrhage.  - Will order 2-4x 50cc NS flushes via foley every 30 minutes in order to maintain patency / urine no darker red than light pink (grapefruit-like color). Will adjust this frequency PRN.  - In AM, likely consult Urology and ask them for assistance/recommendations.     Intervention Category Major Interventions: Hemorrhage - evaluation and management  Marveen Reeks Zaharah Amir 01/24/2020, 5:04 AM

## 2020-01-24 NOTE — Progress Notes (Signed)
eLink Physician-Brief Progress Note Patient Name: Jon Baker DOB: Apr 09, 1932 MRN: [id number]   Date of Service  01/24/2020  HPI/Events of Note  RN reports foley is no longer draining urine despite bladder scan showing >600cc in bladder. Foley placement in ED had been somewhat traumatic and hematuria had been noted in the lead up to this.  I camera'd into the room while RN manually instilled 50cc saline into the foley and then attempted to aspirate the fluid without success. I.e. manual irrigation failed.   eICU Interventions  Foley will need to be removed and replaced. I have requested the RN do this now.     Intervention Category Major Interventions: Other:  Janae Bridgeman 01/24/2020, 3:12 AM

## 2020-01-25 DIAGNOSIS — J9621 Acute and chronic respiratory failure with hypoxia: Secondary | ICD-10-CM

## 2020-01-25 DIAGNOSIS — L8990 Pressure ulcer of unspecified site, unspecified stage: Secondary | ICD-10-CM | POA: Insufficient documentation

## 2020-01-25 LAB — COMPREHENSIVE METABOLIC PANEL
ALT: 147 U/L — ABNORMAL HIGH (ref 0–44)
AST: 145 U/L — ABNORMAL HIGH (ref 15–41)
Albumin: 2.6 g/dL — ABNORMAL LOW (ref 3.5–5.0)
Alkaline Phosphatase: 51 U/L (ref 38–126)
Anion gap: 11 (ref 5–15)
BUN: 48 mg/dL — ABNORMAL HIGH (ref 8–23)
CO2: 25 mmol/L (ref 22–32)
Calcium: 7.7 mg/dL — ABNORMAL LOW (ref 8.9–10.3)
Chloride: 105 mmol/L (ref 98–111)
Creatinine, Ser: 1.77 mg/dL — ABNORMAL HIGH (ref 0.61–1.24)
GFR calc Af Amer: 40 mL/min — ABNORMAL LOW (ref >60)
GFR calc non Af Amer: 34 mL/min — ABNORMAL LOW (ref >60)
Glucose, Bld: 143 mg/dL — ABNORMAL HIGH (ref 70–99)
Potassium: 3.6 mmol/L (ref 3.5–5.1)
Sodium: 141 mmol/L (ref 135–145)
Total Bilirubin: 0.7 mg/dL (ref 0.3–1.2)
Total Protein: 5.1 g/dL — ABNORMAL LOW (ref 6.5–8.1)

## 2020-01-25 LAB — C-REACTIVE PROTEIN: CRP: 5.3 mg/dL — ABNORMAL HIGH (ref <1.0)

## 2020-01-25 LAB — CBC
HCT: 37.9 % — ABNORMAL LOW (ref 39.0–52.0)
Hemoglobin: 12.6 g/dL — ABNORMAL LOW (ref 13.0–17.0)
MCH: 32.6 pg (ref 26.0–34.0)
MCHC: 33.2 g/dL (ref 30.0–36.0)
MCV: 98.2 fL (ref 80.0–100.0)
Platelets: 162 10*3/uL (ref 150–400)
RBC: 3.86 MIL/uL — ABNORMAL LOW (ref 4.22–5.81)
RDW: 13.1 % (ref 11.5–15.5)
WBC: 24.2 10*3/uL — ABNORMAL HIGH (ref 4.0–10.5)
nRBC: 0 % (ref 0.0–0.2)

## 2020-01-25 LAB — FIBRINOGEN: Fibrinogen: 386 mg/dL (ref 210–475)

## 2020-01-25 LAB — LEGIONELLA PNEUMOPHILA SEROGP 1 UR AG: L. pneumophila Serogp 1 Ur Ag: NEGATIVE

## 2020-01-25 LAB — D-DIMER, QUANTITATIVE: D-Dimer, Quant: 5.39 ug/mL-FEU — ABNORMAL HIGH (ref 0.00–0.50)

## 2020-01-25 LAB — GLUCOSE, CAPILLARY: Glucose-Capillary: 114 mg/dL — ABNORMAL HIGH (ref 70–99)

## 2020-01-25 MED ORDER — ATORVASTATIN CALCIUM 80 MG PO TABS
80.0000 mg | ORAL_TABLET | Freq: Every day | ORAL | Status: DC
  Administered 2020-01-25 – 2020-01-31 (×7): 80 mg via ORAL
  Filled 2020-01-25 (×7): qty 1

## 2020-01-25 MED ORDER — CHLORHEXIDINE GLUCONATE 0.12 % MT SOLN
OROMUCOSAL | Status: AC
  Filled 2020-01-25: qty 15

## 2020-01-25 MED ORDER — BUDESONIDE 0.25 MG/2ML IN SUSP: 0.5000 mg | Freq: Two times a day (BID) | RESPIRATORY_TRACT | Status: DC

## 2020-01-25 MED ORDER — IPRATROPIUM-ALBUTEROL 20-100 MCG/ACT IN AERS
2.0000 | INHALATION_SPRAY | Freq: Four times a day (QID) | RESPIRATORY_TRACT | Status: DC
  Administered 2020-01-25 (×2): 2 via RESPIRATORY_TRACT
  Filled 2020-01-25: qty 4

## 2020-01-25 MED ORDER — IPRATROPIUM-ALBUTEROL 20-100 MCG/ACT IN AERS
2.0000 | INHALATION_SPRAY | Freq: Three times a day (TID) | RESPIRATORY_TRACT | Status: DC
  Administered 2020-01-26: 2 via RESPIRATORY_TRACT
  Filled 2020-01-25: qty 4

## 2020-01-25 MED ORDER — IPRATROPIUM-ALBUTEROL 0.5-2.5 (3) MG/3ML IN SOLN: 3.0000 mL | Freq: Four times a day (QID) | RESPIRATORY_TRACT | Status: DC

## 2020-01-25 MED ORDER — BUDESONIDE 0.25 MG/2ML IN SUSP: 0.2500 mg | Freq: Two times a day (BID) | RESPIRATORY_TRACT | Status: DC

## 2020-01-25 MED ORDER — CARVEDILOL 12.5 MG PO TABS
12.5000 mg | ORAL_TABLET | Freq: Two times a day (BID) | ORAL | Status: DC
  Administered 2020-01-25 – 2020-02-01 (×13): 12.5 mg via ORAL
  Filled 2020-01-25 (×14): qty 1

## 2020-01-25 NOTE — Progress Notes (Signed)
Have spoken with both daughters on the phone this evening. They understand that with pt's co-morbidities, his condition has the potential to decline precipitously. I have assured them that in the event his condition deteriorates, we well notify them expeditiously so they might have an opportunity to visit.  He has presented with waxing and waning cognition after extubation. He has been unable to verbalize where he is when questioned. He appears confused to the function of objects (attempts to wipe his mouth w/ card, seems fidgety with call bell and yankuer). He does appear to interact apropriately with family in virtual visit.

## 2020-01-25 NOTE — Progress Notes (Signed)
eLink Physician-Brief Progress Note Patient Name: Jon Baker DOB: 07-Sep-1932 MRN: [id number]   Date of Service  01/25/2020  HPI/Events of Note  Foley catheter is clotted off.  eICU Interventions  Order entered to replace clotted foley catheter with a new catheter.        Thomasene Lot Alima Naser 01/25/2020, 11:39 PM

## 2020-01-25 NOTE — Progress Notes (Addendum)
Assisted tele visit to patient with wife and daughters.  Candra Wegner M Vladislav Axelson, RN   

## 2020-01-25 NOTE — Progress Notes (Signed)
Patient alert and following all commands.  Extubated patient to 10 L HFNC sat 100%, HR 65, RR 21, good strong productive cough, knew his name and location.  Tolerated well and will continue to monitor.

## 2020-01-25 NOTE — Progress Notes (Signed)
NAME:  Jon Baker, MRN:  [id number], DOB:  [birth year], LOS: 2 ADMISSION DATE:  01/23/2020, CONSULTATION DATE:  01/23/20 REFERRING MD:  Regenia Skeeter - EM, CHIEF COMPLAINT:  Acute respiratory failure, AMS  Brief History   86 yo male smoker brought to ER unresponsive and had fever 102.7.  Intubated in ER and required pressors.  Found to have COVID 19 pneumonia.  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  Past Medical History  COPD, ILD, combined CHF, HTN, CAD, Depression, HLD, Gallstone pancreatitis, RBBB  Significant Hospital Events   2/14 Admit  Consults:  Cardiology  Procedures:  2/14 ETT > 2/16  Significant Diagnostic Tests:  CT Head  2/14 >> chronic small vessel changes, nothing acute Echocardiogram 2/14 >> LVEF 35-40% with regional wall motion abnormalities, grade 1 diastolic dysfunction, mildly reduced RV function and mildly elevated PASP  Micro Data:  2/14 SARS Cov2 >> positive 2/14 Flu A/B >> both negative Blood 2/14 >>   Antimicrobials:  Remdesivir 2/14 > Dexamethasone 2/14 >  COVID Therapy:  Ceftriaxone 2/14 >  Azithromycin 2/14 >   Interim history/subjective:  Pressure support.  Objective   Blood pressure (!) 142/96, pulse (!) 36, temperature 97.9 F (36.6 C), temperature source Axillary, resp. rate 17, height 5\' 11"  (1.803 m), weight 107.4 kg, SpO2 94 %.    Vent Mode: PRVC FiO2 (%):  [40 %] 40 % Set Rate:  [18 bmp] 18 bmp Vt Set:  [500 mL] 500 mL PEEP:  [5 cmH20] 5 cmH20 Pressure Support:  [15 cmH20] 15 cmH20 Plateau Pressure:  [15 cmH20-21 cmH20] 20 cmH20   Intake/Output Summary (Last 24 hours) at 01/25/2020 0930 Last data filed at 01/25/2020 0600 Gross per 24 hour  Intake 3767.96 ml  Output 3750 ml  Net 17.96 ml   Filed Weights   01/23/20 1800 01/24/20 0500 01/25/20 0441  Weight: 106.8 kg 108.8 kg 107.4 kg    Examination:  General -  sedated Eyes - pupils reactive ENT - ETT in place Cardiac - regular rate/rhythm, no murmur Chest - prolonged exhalation, no wheeze, scattered rhonchi Abdomen - soft, non tender, + bowel sounds Extremities - 1+ edema Skin - no rashes Neuro - RASS 0   Resolved Hospital Problem list   Anion gap metabolic acidosis  Assessment & Plan:   Acute on chronic hypoxic respiratory failure from COVID 19 pneumonia with possible bacterial superinfection. 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 - day 3/5 of remdesivir - day 3/10 of decadron - day 3/5 of ABx - f/u CXR intermittently - f/u CRP >> if above 10, then consider tocilizumab - extubation trial 2/16 - goal SpO2 88 to 95%  Elevated troponin from demand ischemia. Hx of CAD, chronic combined CHF, HTN. - seen by cardiology >> supportive care - continue ASA - hold outpt plavix in setting of hematuria - resume lipitor - hold outpt coreg  Acute metabolic encephalopathy 2nd to hypoxia. - monitor mental status after extubation  CKD 3. - f/u BMET  Hematuria. - heparin held - flushing catheter - if progresses, would need urology assessment  Pressure injuries. - stage 1 sacrum, present on admission - wound care  Dysphagia. - speech assessment if he has difficulty swallowing after extubation  Deconditioning. - PT/OT  Steroid induced hyperglycemia. - SSI   Best practice:  Diet: NPO DVT prophylaxis: SCD GI prophylaxis: Protonix Mobility: OOB to  chair Code Status: DNR Disposition: ICU  Labs    CMP Latest Ref Rng & Units 01/25/2020 01/24/2020 01/24/2020  Glucose 70 - 99 mg/dL 283(T) - 517(O)  BUN 8 - 23 mg/dL 16(W) - 73(X)  Creatinine 0.61 - 1.24 mg/dL 1.06(Y) - 6.94(W)  Sodium 135 - 145 mmol/L 141 139 141  Potassium 3.5 - 5.1 mmol/L 3.6 3.3(L) 3.5  Chloride 98 - 111 mmol/L 105 - 103  CO2 22 - 32 mmol/L 25 - 22  Calcium 8.9 - 10.3 mg/dL 7.7(L) - 7.8(L)  Total Protein 6.5 - 8.1 g/dL 5.1(L) -  5.3(L)  Total Bilirubin 0.3 - 1.2 mg/dL 0.7 - 1.0  Alkaline Phos 38 - 126 U/L 51 - 58  AST 15 - 41 U/L 145(H) - 146(H)  ALT 0 - 44 U/L 147(H) - 109(H)    CBC Latest Ref Rng & Units 01/25/2020 01/24/2020 01/24/2020  WBC 4.0 - 10.5 K/uL 24.2(H) - 19.4(H)  Hemoglobin 13.0 - 17.0 g/dL 12.6(L) 11.9(L) 12.8(L)  Hematocrit 39.0 - 52.0 % 37.9(L) 35.0(L) 39.0  Platelets 150 - 400 K/uL 162 - 146(L)    ABG    Component Value Date/Time   PHART 7.395 01/24/2020 0459   PCO2ART 41.1 01/24/2020 0459   PO2ART 115.0 (H) 01/24/2020 0459   HCO3 25.2 01/24/2020 0459   TCO2 26 01/24/2020 0459   ACIDBASEDEF 1.0 01/23/2020 1545   O2SAT 98.0 01/24/2020 0459    CBG (last 3)  Recent Labs    01/24/20 0759 01/24/20 1200 01/24/20 1655  GLUCAP 124* 124* 137*    CC time 33 minutes  Coralyn Helling, MD [facility] 01/25/2020, 11:17 AM

## 2020-01-25 NOTE — Progress Notes (Signed)
Spoke with pt's daughter and wife over the phone.  Updated about current progress.  Discussed whether Mr. Hedglin would want reintubation if needed.  They do not think he would, but want him to participate in the conversation.  I explained that he is still confused after events of past couple of days, and I wouldn't rely on his decision if he said he didn't want reintubation at this time.  Will continue to address this with pt and family as we go ahead.  Also, his blood pressure has been more elevated throughout the day.  He seems okay with swallowing pills.  Will resume coreg at 12.5 mg bid, and titrate up to home dose as tolerated.  Time spent 28 minutes.  Coralyn Helling, MD [facility]Critical [facility] 01/25/2020, 3:58 PM

## 2020-01-26 LAB — CBC
HCT: 39.1 % (ref 39.0–52.0)
Hemoglobin: 13.2 g/dL (ref 13.0–17.0)
MCH: 32.8 pg (ref 26.0–34.0)
MCHC: 33.8 g/dL (ref 30.0–36.0)
MCV: 97 fL (ref 80.0–100.0)
Platelets: 180 10*3/uL (ref 150–400)
RBC: 4.03 MIL/uL — ABNORMAL LOW (ref 4.22–5.81)
RDW: 13 % (ref 11.5–15.5)
WBC: 26 10*3/uL — ABNORMAL HIGH (ref 4.0–10.5)
nRBC: 0 % (ref 0.0–0.2)

## 2020-01-26 LAB — COMPREHENSIVE METABOLIC PANEL
ALT: 177 U/L — ABNORMAL HIGH (ref 0–44)
AST: 135 U/L — ABNORMAL HIGH (ref 15–41)
Albumin: 2.9 g/dL — ABNORMAL LOW (ref 3.5–5.0)
Alkaline Phosphatase: 56 U/L (ref 38–126)
Anion gap: 13 (ref 5–15)
BUN: 41 mg/dL — ABNORMAL HIGH (ref 8–23)
CO2: 25 mmol/L (ref 22–32)
Calcium: 8 mg/dL — ABNORMAL LOW (ref 8.9–10.3)
Chloride: 105 mmol/L (ref 98–111)
Creatinine, Ser: 1.18 mg/dL (ref 0.61–1.24)
GFR calc Af Amer: >60 mL/min (ref >60)
GFR calc non Af Amer: 56 mL/min — ABNORMAL LOW (ref >60)
Glucose, Bld: 112 mg/dL — ABNORMAL HIGH (ref 70–99)
Potassium: 3.7 mmol/L (ref 3.5–5.1)
Sodium: 143 mmol/L (ref 135–145)
Total Bilirubin: 1 mg/dL (ref 0.3–1.2)
Total Protein: 5.8 g/dL — ABNORMAL LOW (ref 6.5–8.1)

## 2020-01-26 LAB — GLUCOSE, CAPILLARY: Glucose-Capillary: 104 mg/dL — ABNORMAL HIGH (ref 70–99)

## 2020-01-26 LAB — C-REACTIVE PROTEIN: CRP: 3 mg/dL — ABNORMAL HIGH (ref <1.0)

## 2020-01-26 MED ORDER — FUROSEMIDE 20 MG PO TABS
20.0000 mg | ORAL_TABLET | Freq: Every day | ORAL | Status: DC
  Administered 2020-01-26 – 2020-01-27 (×2): 20 mg via ORAL
  Filled 2020-01-26 (×2): qty 1

## 2020-01-26 MED ORDER — HYDRALAZINE HCL 20 MG/ML IJ SOLN
5.0000 mg | INTRAMUSCULAR | Status: DC | PRN
  Administered 2020-01-26: 10 mg via INTRAVENOUS
  Administered 2020-01-26 (×2): 5 mg via INTRAVENOUS
  Administered 2020-01-26 – 2020-01-27 (×2): 10 mg via INTRAVENOUS
  Filled 2020-01-26 (×6): qty 1

## 2020-01-26 MED ORDER — ORAL CARE MOUTH RINSE
15.0000 mL | Freq: Two times a day (BID) | OROMUCOSAL | Status: DC
  Administered 2020-01-26 – 2020-02-01 (×13): 15 mL via OROMUCOSAL

## 2020-01-26 MED ORDER — PREDNISONE 10 MG PO TABS: 10.0000 mg | ORAL_TABLET | Freq: Every day | ORAL | Status: DC

## 2020-01-26 MED ORDER — LACTATED RINGERS IV SOLN: INTRAVENOUS | Status: DC | PRN

## 2020-01-26 MED ORDER — GUAIFENESIN-DM 100-10 MG/5ML PO SYRP
5.0000 mL | ORAL_SOLUTION | ORAL | Status: DC | PRN
  Administered 2020-01-26: 5 mL via ORAL
  Filled 2020-01-26: qty 5

## 2020-01-26 MED ORDER — UMECLIDINIUM BROMIDE 62.5 MCG/INH IN AEPB
1.0000 | INHALATION_SPRAY | Freq: Every day | RESPIRATORY_TRACT | Status: DC
  Administered 2020-01-26 – 2020-02-01 (×7): 1 via RESPIRATORY_TRACT
  Filled 2020-01-26 (×2): qty 7

## 2020-01-26 MED ORDER — MOMETASONE FURO-FORMOTEROL FUM 100-5 MCG/ACT IN AERO
2.0000 | INHALATION_SPRAY | Freq: Two times a day (BID) | RESPIRATORY_TRACT | Status: DC
  Administered 2020-01-26 – 2020-02-01 (×12): 2 via RESPIRATORY_TRACT
  Filled 2020-01-26: qty 8.8

## 2020-01-26 MED ORDER — ALBUTEROL SULFATE HFA 108 (90 BASE) MCG/ACT IN AERS
2.0000 | INHALATION_SPRAY | RESPIRATORY_TRACT | Status: DC | PRN
  Administered 2020-01-26 – 2020-01-27 (×5): 2 via RESPIRATORY_TRACT
  Filled 2020-01-26: qty 6.7

## 2020-01-26 MED ORDER — POTASSIUM CHLORIDE CRYS ER 20 MEQ PO TBCR
40.0000 meq | EXTENDED_RELEASE_TABLET | Freq: Once | ORAL | Status: AC
  Administered 2020-01-26: 40 meq via ORAL
  Filled 2020-01-26: qty 2

## 2020-01-26 NOTE — Evaluation (Signed)
Physical Therapy Evaluation Patient Details Name: Daveon Arpino MRN: [id number] DOB: 1932/01/02 Today's Date: 01/26/2020   History of Present Illness  86 yo male smoker brought to ER unresponsive and had fever 102.7.  Intubated in ER and required pressors.  Found to have COVID 19 pneumonia.  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  Clinical Impression  Pt admitted with above diagnosis. Pt able to take pivotal steps to chair with RW with mod assist of 2 due to safety issues as pt not processing and needing a lot of cues for safety as well as assist to move RW.  Will most likely need SNF at d/c.  Pt currently with functional limitations due to the deficits listed below (see PT Problem List). Pt will benefit from skilled PT to increase their independence and safety with mobility to allow discharge to the venue listed below.      Follow Up Recommendations SNF;Supervision/Assistance - 24 hour    Equipment Recommendations  None recommended by PT    Recommendations for Other Services       Precautions / Restrictions Precautions Precautions: Fall Restrictions Weight Bearing Restrictions: No      Mobility  Bed Mobility Overal bed mobility: Needs Assistance Bed Mobility: Supine to Sit     Supine to sit: Mod assist     General bed mobility comments: Assist to come to eOB with PT moving LEs and elevation of trunk.   Transfers Overall transfer level: Needs assistance Equipment used: Rolling walker (2 wheeled) Transfers: Sit to/from [employer] Sit to Stand: Mod assist;+2 physical assistance;From elevated surface Stand pivot transfers: Mod assist;+2 physical assistance;From elevated surface       General transfer comment: Pt needed mod assist and cues to stand for power up and for pivoting to 3n1 and then to recliner due to poor safety awareness and poor motor  planning of activity. HAd to move the Rw for pt and physically assist him to sit to 3N1 and then to recliner.    Ambulation/Gait                Stairs            Wheelchair Mobility    Modified Rankin (Stroke Patients Only)       Balance Overall balance assessment: Needs assistance Sitting-balance support: No upper extremity supported;Feet supported Sitting balance-Leahy Scale: Fair     Standing balance support: Bilateral upper extremity supported;During functional activity Standing balance-Leahy Scale: Poor Standing balance comment: relies on UE support for balance.                              Pertinent Vitals/Pain Pain Assessment: No/denies pain    Home Living Family/patient expects to be discharged to:: Private residence Living Arrangements: Spouse/significant other Available Help at Discharge: Family;Available 24 hours/day Type of Home: House Home Access: Stairs to enter Entrance Stairs-Rails: Right Entrance Stairs-Number of Steps: 4 Home Layout: Two level;Able to live on main level with bedroom/bathroom Home Equipment: Walker - 4 wheels;Bedside commode;Shower seat      Prior Function Level of Independence: Independent               Hand Dominance   Dominant Hand: Right    Extremity/Trunk Assessment   Upper Extremity Assessment Upper Extremity Assessment: Defer to OT evaluation    Lower Extremity Assessment  Lower Extremity Assessment: Generalized weakness    Cervical / Trunk Assessment Cervical / Trunk Assessment: Kyphotic  Communication   Communication: No difficulties  Cognition Arousal/Alertness: Lethargic;Suspect due to medications Behavior During Therapy: Flat affect Overall Cognitive Status: Impaired/Different from baseline Area of Impairment: Orientation;Memory;Following commands;Safety/judgement;Awareness;Problem solving                 Orientation Level: Situation;Place   Memory: Decreased short-term  memory;Decreased recall of precautions Following Commands: Follows one step commands with increased time;Follows one step commands inconsistently Safety/Judgement: Decreased awareness of safety;Decreased awareness of deficits   Problem Solving: Slow processing;Decreased initiation;Difficulty sequencing;Requires verbal cues;Requires tactile cues General Comments: Pt slow to respond to commands, poor motor planning.        General Comments General comments (skin integrity, edema, etc.): 71-84bpm, 94% 6LO2 at rest, 87% with activity on 6L but recovered quickly, 139/110    Exercises General Exercises - Lower Extremity Ankle Circles/Pumps: AROM;Both;5 reps;Supine Long Arc Quad: AROM;Both;5 reps;Seated   Assessment/Plan    PT Assessment Patient needs continued PT services  PT Problem List Decreased activity tolerance;Decreased balance;Decreased mobility;Decreased knowledge of use of DME;Decreased cognition;Decreased safety awareness;Decreased knowledge of precautions       PT Treatment Interventions DME instruction;Gait training;Functional mobility training;Therapeutic activities;Therapeutic exercise;Cognitive remediation;Patient/family education;Balance training;Stair training    PT Goals (Current goals can be found in the Care Plan section)  Acute Rehab PT Goals Patient Stated Goal: to go home PT Goal Formulation: With patient Time For Goal Achievement: 02/09/20 Potential to Achieve Goals: Good    Frequency Min 3X/week   Barriers to discharge        Co-evaluation               AM-PAC PT "6 Clicks" Mobility  Outcome Measure Help needed turning from your back to your side while in a flat bed without using bedrails?: A Lot Help needed moving from lying on your back to sitting on the side of a flat bed without using bedrails?: A Lot Help needed moving to and from a bed to a chair (including a wheelchair)?: A Lot Help needed standing up from a chair using your arms (e.g.,  wheelchair or bedside chair)?: A Lot Help needed to walk in hospital room?: Total Help needed climbing 3-5 steps with a railing? : Total 6 Click Score: 10    End of Session Equipment Utilized During Treatment: Gait belt;Oxygen Activity Tolerance: Patient limited by fatigue Patient left: in chair;with call bell/phone within reach;with chair alarm set Nurse Communication: Mobility status PT Visit Diagnosis: Unsteadiness on feet (R26.81);Muscle weakness (generalized) (M62.81);Other abnormalities of gait and mobility (R26.89)    Time: 7342-8768 PT Time Calculation (min) (ACUTE ONLY): 28 min   Charges:   PT Evaluation $PT Eval Moderate Complexity: 1 Mod PT Treatments $Therapeutic Activity: 8-22 mins        Rashawn Rayman W,PT Acute Rehabilitation Services Pager:  [phone]  Office:  [place] 01/26/2020, 1:52 PM

## 2020-01-26 NOTE — Progress Notes (Signed)
Assisted tele visit to patient with wife and daughters.  Vena Austria, RN

## 2020-01-26 NOTE — Progress Notes (Signed)
Patient arrived on the unit via wheelchair with [facility], NT and on 7 HFNC. Patient required maximum assistance with stand and pivot. Noted to have redness on buttocks and smeared stool. Patient washed up and skin protection applied. Ginger, RN CN assisted with skin check. PT in to assist with assisting patient to chair. Patient noted to be extremely confused constantly pulling at lines and wires and diaphoretic. Ins./exp. wheezing noted throughout. Patient hooked up to room montor and CCMD notified of transfer.

## 2020-01-26 NOTE — Progress Notes (Signed)
eLink Physician-Brief Progress Note Patient Name: Marquett Bertoli DOB: Jan 14, 1932 MRN: [id number]   Date of Service  01/26/2020  HPI/Events of Note  RN calling for cough syrup Pt. Is taking po's without difficulty  eICU Interventions  Robitussin DM Q 4 prn cough ordered     Intervention Category Minor Interventions: Other:  Bevelyn Ngo 01/26/2020, 10:17 PM

## 2020-01-26 NOTE — Progress Notes (Signed)
Updated pt's wife and daughter over the phone about current status.  Informed them about conversation with pt this morning regarding goals of care and confirming DNR status, and plan to transfer to progressive care.  Coralyn Helling, MD [facility] Pulmonary/Critical Care 01/26/2020, 10:34 AM

## 2020-01-26 NOTE — Progress Notes (Signed)
eLink Physician-Brief Progress Note Patient Name: Jcion Buddenhagen DOB: 1932/03/26 MRN: [id number]   Date of Service  01/26/2020  HPI/Events of Note  SBP 170-180's, HR 66  eICU Interventions  Hydralazine 5-10 mg iv Q 4 hours prn SBP > 160 mmHg ordered.        Thomasene Lot Chace Klippel 01/26/2020, 12:57 AM

## 2020-01-26 NOTE — Progress Notes (Signed)
NAME:  Jon Baker, MRN:  [id number], DOB:  21-Apr-1932, LOS: 3 ADMISSION DATE:  01/23/2020, CONSULTATION DATE:  01/23/20 REFERRING MD:  Regenia Skeeter - EM, CHIEF COMPLAINT:  Acute respiratory failure, AMS  Brief History   86 yo male smoker brought to ER unresponsive and had fever 102.7.  Intubated in ER and required pressors.  Found to have COVID 19 pneumonia.  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  Past Medical History  COPD, ILD, combined CHF, HTN, CAD, Depression, HLD, Gallstone pancreatitis, RBBB  Significant Hospital Events   2/14 Admit 2/16 extubated 2/17 transfer out of ICU  Consults:  Cardiology  Procedures:  2/14 ETT > 2/16  Significant Diagnostic Tests:  CT Head  2/14 >> chronic small vessel changes, nothing acute Echocardiogram 2/14 >> LVEF 35-40% with regional wall motion abnormalities, grade 1 diastolic dysfunction, mildly reduced RV function and mildly elevated PASP  Micro Data:  2/14 SARS Cov2 >> positive 2/14 Flu A/B >> both negative Blood 2/14 >>   Antimicrobials:  Remdesivir 2/14 > Dexamethasone 2/14 >  COVID Therapy:  Ceftriaxone 2/14 >  Azithromycin 2/14 >   Interim history/subjective:  Doesn't remember what happened.  Wants to know when he can eat and when he can go home.  Objective   Blood pressure (!) 142/90, pulse 79, temperature 98.1 F (36.7 C), temperature source Oral, resp. rate (!) 23, height 5\' 11"  (1.803 m), weight 106.8 kg, SpO2 98 %.    FiO2 (%):  [40 %] 40 %   Intake/Output Summary (Last 24 hours) at 01/26/2020 0756 Last data filed at 01/26/2020 0751 Gross per 24 hour  Intake 1949.89 ml  Output 2110 ml  Net -160.11 ml   Filed Weights   01/24/20 0500 01/25/20 0441 01/26/20 0412  Weight: 108.8 kg 107.4 kg 106.8 kg    Examination:  General - more alert Eyes - pupils reactive ENT - no sinus tenderness, no stridor  Cardiac - regular rate/rhythm, no murmur Chest - prolonged exhalation, faint b/l wheeze Abdomen - soft, non tender, + bowel sounds Extremities - 1+ edema Skin - no rashes Neuro - moves extremities, follows commands GU - foley in place with red tinged urine    Resolved Hospital Problem list   Anion gap metabolic acidosis, Dysphagia  Assessment & Plan:   Acute on chronic hypoxic respiratory failure from COVID 19 pneumonia with possible bacterial superinfection. 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 - add dulera, spiriva 2/17 - prn albuterol - on 10 mg prednisone daily as outpt >> resume after he has completed course of decadron  Elevated troponin from demand ischemia. Hx of CAD, chronic combined CHF, HTN. - seen by cardiology >> supportive care - continue ASA - hold outpt plavix in setting of hematuria - continue coreg, lipitor - add lasix 1/75  Acute metabolic encephalopathy 2nd to hypoxia. - improved  CKD 3a. - f/u BMET  Hematuria. - heparin held - foley replaced 2/17 due to clot formation - if progresses, would need urology assessment  Pressure injuries. - stage 1 sacrum, present on admission - wound care  Deconditioning. - PT/OT  Steroid induced hyperglycemia. - SSI  Goals of care. - mental status improved 2/17 and pt able to participate in conversation - he confirmed that he would not want reintubation; DNR order in place  Best practice:  Diet: heart healthy DVT prophylaxis: SCD GI prophylaxis: no longer indicated Mobility: OOB to chair Code Status: DNR Disposition: to 5w 2/17; will ask [facility] to assume primary care from 2/18 and PCCM follow as pulmonary consult  Labs    CMP Latest Ref Rng & Units 01/26/2020  01/25/2020 01/24/2020  Glucose 70 - 99 mg/dL 979(G) 921(J) -  BUN 8 - 23 mg/dL 94(R) 74(Y) -  Creatinine 0.61 - 1.24 mg/dL 8.14 4.81(E) -  Sodium 135 - 145 mmol/L 143 141 139  Potassium 3.5 - 5.1 mmol/L 3.7 3.6 3.3(L)  Chloride 98 - 111 mmol/L 105 105 -  CO2 22 - 32 mmol/L 25 25 -  Calcium 8.9 - 10.3 mg/dL 8.0(L) 7.7(L) -  Total Protein 6.5 - 8.1 g/dL 5.6(D) 5.1(L) -  Total Bilirubin 0.3 - 1.2 mg/dL 1.0 0.7 -  Alkaline Phos 38 - 126 U/L 56 51 -  AST 15 - 41 U/L 135(H) 145(H) -  ALT 0 - 44 U/L 177(H) 147(H) -    CBC Latest Ref Rng & Units 01/26/2020 01/25/2020 01/24/2020  WBC 4.0 - 10.5 K/uL 26.0(H) 24.2(H) -  Hemoglobin 13.0 - 17.0 g/dL 14.9 12.6(L) 11.9(L)  Hematocrit 39.0 - 52.0 % 39.1 37.9(L) 35.0(L)  Platelets 150 - 400 K/uL 180 162 -    ABG    Component Value Date/Time   PHART 7.395 01/24/2020 0459   PCO2ART 41.1 01/24/2020 0459   PO2ART 115.0 (H) 01/24/2020 0459   HCO3 25.2 01/24/2020 0459   TCO2 26 01/24/2020 0459   ACIDBASEDEF 1.0 01/23/2020 1545   O2SAT 98.0 01/24/2020 0459    CBG (last 3)  Recent Labs    01/24/20 1200 01/24/20 1655 01/25/20 1931  GLUCAP 124* 137* 114*     Coralyn Helling, MD [place] [facility]monary/Critical Care 01/26/2020, 7:56 AM

## 2020-01-26 NOTE — Progress Notes (Signed)
Patient up in the chair and daughter called to speak to patient. RN notified daughter that patient was very confused. Daughter stated "the doctor said he was very lucid and oriented." Patient has not been oriented since arrival to 5W26 and per ICU nurse. Patient did not recognize daughter and RN heard patient tell daughter "Satira Anis. I'm at [facility]. We got here today and its 'foggy.".Also heard patient tell daughter " well, thank you for the Christmas card and I don't want to keep yall long so I'm going to go. Goodbye.". Patient did not remember who he was talking to when RN asked. Chair alarm posey audible and activated. Callbell within reach. Frequent checks being made on the patient.

## 2020-01-26 NOTE — Progress Notes (Signed)
Occupational Therapy Treatment Patient Details Name: Jon Baker MRN: [id number] DOB: 04-22-1932 Today's Date: 01/26/2020    History of present illness 86 yo male smoker brought to ER unresponsive and had fever 102.7.  Intubated in ER and required pressors.  Found to have COVID 19 pneumonia.  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   OT comments  PTA, Pt was Modified Independent with ADLs and mobility with RW. Presently, pt presents with decreased strength, endurance, cognition, safety, and overall standing balance increasing fall risk. Pt received on 6 L O2 seated in recliner chair. Guided pt in grooming tasks seated in chair Supervision level due to pt frequent attempting to pull lines and leads off. Pt able to demonstrate sit to stand with RW at Min A, but O2 stats dropped quickly in standing to low 80s. Pt with difficulty following safety instructions to sit back in recliner requiring manual OT assist to safely sit in chair. Instructed in pursed lip breathing with pt difficulty following directions, but with seated rest break and trial of new ear probe, stats improved to upper 90s on 6 L O2. Pt perseverating on desire to "rotate bed around" and fidgeting with leads. Recommend SNF based on pt's current functional abilities. Will continue to follow acutely.    Follow Up Recommendations  SNF;Supervision/Assistance - 24 hour    Equipment Recommendations  Other (comment)(To be determined)    Recommendations for Other Services      Precautions / Restrictions Precautions Precautions: Fall Restrictions Weight Bearing Restrictions: No       Mobility Bed Mobility Overal bed mobility: Needs Assistance             General bed mobility comments: Pt in recliner on OT entry  Transfers Overall transfer level: Needs assistance Equipment used: Rolling walker (2  wheeled) Transfers: Sit to/from Stand Sit to Stand: Min assist         General transfer comment: Pt Min A for sit to stand from recliner with RW. Pt attempting to ambulate forward with RW, despite cues to stand statically. Pt did not follow direction to sit in recliner, OT had to manually assist pt back into recliner chair    Balance Overall balance assessment: Needs assistance                                         ADL either performed or assessed with clinical judgement   ADL Overall ADL's : Needs assistance/impaired Eating/Feeding: Supervision/ safety;Sitting   Grooming: Supervision/safety;Wash/dry hands;Wash/dry face;Sitting Grooming Details (indicate cue type and reason): Supervision and cues needed due to pt pulling ear probe Upper Body Bathing: Moderate assistance;Sitting   Lower Body Bathing: Maximal assistance;Sit to/from stand   Upper Body Dressing : Sitting;Moderate assistance   Lower Body Dressing: Total assistance;Sit to/from stand   Toilet Transfer: Minimal assistance;+2 for safety/equipment;Stand-pivot;BSC   Toileting- Clothing Manipulation and Hygiene: Maximal assistance;+2 for safety/equipment;Sit to/from stand       Functional mobility during ADLs: Minimal assistance;Rolling walker General ADL Comments: Pt with decreased attention to task and direction impacting safety during tasks. Pt overall Min A for standing/transfers, but 2 person assist may be needed for safety     Vision Baseline Vision/History: Wears glasses     Perception     Praxis  Cognition Arousal/Alertness: Lethargic Behavior During Therapy: Flat affect;Restless(Pt fidgity with lines) Overall Cognitive Status: Impaired/Different from baseline Area of Impairment: Attention;Memory;Following commands;Safety/judgement;Awareness                   Current Attention Level: Sustained Memory: Decreased short-term memory;Decreased recall of  precautions Following Commands: Follows one step commands inconsistently;Follows one step commands with increased time Safety/Judgement: Decreased awareness of safety;Decreased awareness of deficits Awareness: Emergent Problem Solving: Slow processing;Decreased initiation;Difficulty sequencing;Requires verbal cues;Requires tactile cues General Comments: Pt slow to respond to commands, poor motor planning.          Exercises Exercises: Other exercises Other Exercises Other Exercises: Pursed lip breathing   Shoulder Instructions       General Comments Pt received on 6 L O2 and in upper 90s while seated. Upon standing, O2 quickly dropped to low 80s on 6 L O2. Instructed in pursed lip breathing with pt having difficulty attending to task. Trialed ear probe with improved O2 stats. Pt noted with labored breathing sounds, but increased stats to 90s.     Pertinent Vitals/ Pain       Pain Assessment: No/denies pain  Home Living Family/patient expects to be discharged to:: Private residence Living Arrangements: Spouse/significant other Available Help at Discharge: Family;Available 24 hours/day Type of Home: House Home Access: Stairs to enter [employer] of Steps: 4 Entrance Stairs-Rails: Right Home Layout: Two level;Able to live on main level with bedroom/bathroom     Bathroom Shower/Tub: Teacher, early years/pre: Standard     Home Equipment: Environmental consultant - 4 wheels;Bedside commode;Shower seat          Prior Functioning/Environment Level of Independence: Independent            Frequency  Min 2X/week        Progress Toward Goals  OT Goals(current goals can now be found in the care plan section)     Acute Rehab OT Goals Patient Stated Goal: to go home OT Goal Formulation: With patient Time For Goal Achievement: 02/09/20 Potential to Achieve Goals: Good ADL Goals Pt Will Perform Grooming: with set-up;standing Pt Will Perform Upper Body Bathing:  with supervision;sitting Pt Will Perform Upper Body Dressing: with set-up;sitting Pt Will Transfer to Toilet: with min guard assist;stand pivot transfer;bedside commode Pt Will Perform Toileting - Clothing Manipulation and hygiene: with min guard assist;sit to/from stand Additional ADL Goal #1: Pt will demonstrate implementation of 3 energy conservation strategies in order to maintain stable O2 stats and increase ADL independence  Plan      Co-evaluation                 AM-PAC OT "6 Clicks" Daily Activity     Outcome Measure   Help from another person eating meals?: A Little Help from another person taking care of personal grooming?: A Little Help from another person toileting, which includes using toliet, bedpan, or urinal?: A Lot Help from another person bathing (including washing, rinsing, drying)?: A Lot Help from another person to put on and taking off regular upper body clothing?: A Lot Help from another person to put on and taking off regular lower body clothing?: A Lot 6 Click Score: 14    End of Session Equipment Utilized During Treatment: Gait belt;Rolling walker;Oxygen  OT Visit Diagnosis: Unsteadiness on feet (R26.81);Muscle weakness (generalized) (M62.81)   Activity Tolerance Patient tolerated treatment well;Other (comment)(Limited by destats)   Patient Left in chair;with call bell/phone within reach;with chair alarm set  Nurse Communication Other (comment);Mobility status(Safety concerns and pt restless behavior with lines)        Time: 4128-2081 OT Time Calculation (min): 34 min  Charges: OT General Charges $OT Visit: 1 Visit OT Evaluation $OT Eval Moderate Complexity: 1 Mod OT Treatments $Therapeutic Activity: 8-22 mins  Lorre Munroe, OTR/L   Lorre Munroe 01/26/2020, 3:26 PM

## 2020-01-26 NOTE — Progress Notes (Signed)
eLink Physician-Brief Progress Note Patient Name: Jon Baker DOB: 02-06-1932 MRN: [id number]   Date of Service  01/26/2020  HPI/Events of Note  RN requesting renewal of order to flush patient's foley catheter.  eICU Interventions  Order entered.        Thomasene Lot Mikhaila Roh 01/26/2020, 8:28 PM

## 2020-01-26 NOTE — Progress Notes (Signed)
Pt's daughter Consuella Lose called for update and requesting to facetime with her dad. Gave daughter update and pt and family facetime with nurse assistance on ipad. Will continue to monitor pt. Nelda Marseille, RN

## 2020-01-27 ENCOUNTER — Ambulatory Visit: Payer: Medicare Other | Admitting: Internal Medicine

## 2020-01-27 ENCOUNTER — Inpatient Hospital Stay (HOSPITAL_COMMUNITY): Payer: Medicare Other

## 2020-01-27 DIAGNOSIS — R7989 Other specified abnormal findings of blood chemistry: Secondary | ICD-10-CM

## 2020-01-27 DIAGNOSIS — A419 Sepsis, unspecified organism: Secondary | ICD-10-CM

## 2020-01-27 DIAGNOSIS — J9600 Acute respiratory failure, unspecified whether with hypoxia or hypercapnia: Secondary | ICD-10-CM

## 2020-01-27 DIAGNOSIS — J9611 Chronic respiratory failure with hypoxia: Secondary | ICD-10-CM

## 2020-01-27 DIAGNOSIS — R6521 Severe sepsis with septic shock: Secondary | ICD-10-CM

## 2020-01-27 DIAGNOSIS — J1282 Pneumonia due to coronavirus disease 2019: Secondary | ICD-10-CM

## 2020-01-27 LAB — BASIC METABOLIC PANEL
Anion gap: 11 (ref 5–15)
BUN: 36 mg/dL — ABNORMAL HIGH (ref 8–23)
CO2: 27 mmol/L (ref 22–32)
Calcium: 8 mg/dL — ABNORMAL LOW (ref 8.9–10.3)
Chloride: 103 mmol/L (ref 98–111)
Creatinine, Ser: 1.07 mg/dL (ref 0.61–1.24)
GFR calc Af Amer: >60 mL/min (ref >60)
GFR calc non Af Amer: >60 mL/min (ref >60)
Glucose, Bld: 120 mg/dL — ABNORMAL HIGH (ref 70–99)
Potassium: 3.5 mmol/L (ref 3.5–5.1)
Sodium: 141 mmol/L (ref 135–145)

## 2020-01-27 LAB — CBC
HCT: 37.4 % — ABNORMAL LOW (ref 39.0–52.0)
Hemoglobin: 12.7 g/dL — ABNORMAL LOW (ref 13.0–17.0)
MCH: 32.7 pg (ref 26.0–34.0)
MCHC: 34 g/dL (ref 30.0–36.0)
MCV: 96.4 fL (ref 80.0–100.0)
Platelets: 174 10*3/uL (ref 150–400)
RBC: 3.88 MIL/uL — ABNORMAL LOW (ref 4.22–5.81)
RDW: 13.2 % (ref 11.5–15.5)
WBC: 20.6 10*3/uL — ABNORMAL HIGH (ref 4.0–10.5)
nRBC: 0 % (ref 0.0–0.2)

## 2020-01-27 LAB — PROCALCITONIN: Procalcitonin: 1.65 ng/mL

## 2020-01-27 LAB — BRAIN NATRIURETIC PEPTIDE: B Natriuretic Peptide: 567.4 pg/mL — ABNORMAL HIGH (ref 0.0–100.0)

## 2020-01-27 LAB — D-DIMER, QUANTITATIVE: D-Dimer, Quant: 5.89 ug/mL-FEU — ABNORMAL HIGH (ref 0.00–0.50)

## 2020-01-27 MED ORDER — MAGNESIUM SULFATE IN D5W 1-5 GM/100ML-% IV SOLN
1.0000 g | Freq: Once | INTRAVENOUS | Status: AC
  Administered 2020-01-27: 1 g via INTRAVENOUS
  Filled 2020-01-27: qty 100

## 2020-01-27 MED ORDER — ISOSORB DINITRATE-HYDRALAZINE 20-37.5 MG PO TABS
1.0000 | ORAL_TABLET | Freq: Three times a day (TID) | ORAL | Status: DC
  Administered 2020-01-27 – 2020-01-30 (×10): 1 via ORAL
  Filled 2020-01-27 (×10): qty 1

## 2020-01-27 MED ORDER — FUROSEMIDE 10 MG/ML IJ SOLN
80.0000 mg | Freq: Once | INTRAMUSCULAR | Status: AC
  Administered 2020-01-27: 80 mg via INTRAVENOUS
  Filled 2020-01-27: qty 8

## 2020-01-27 MED ORDER — POTASSIUM CHLORIDE CRYS ER 20 MEQ PO TBCR
40.0000 meq | EXTENDED_RELEASE_TABLET | Freq: Once | ORAL | Status: AC
  Administered 2020-01-27: 40 meq via ORAL
  Filled 2020-01-27: qty 2

## 2020-01-27 NOTE — Progress Notes (Signed)
Spoke with patient's daughter, Revonda Standard, and updated on patient's status.

## 2020-01-27 NOTE — NC FL2 (Signed)
[place] LEVEL OF CARE SCREENING TOOL     IDENTIFICATION  Patient Name: Jon Baker Birthdate: [birth year] Sex: male Admission Date (Current Location): 01/23/2020  [facility] and [place] Number:  Herbalist and Address:  [facility], [place] [address] [place], [place] [address]      Provider Number: [id number]  Attending Physician Name and Address:  Thurnell Lose, MD  Relative Name and Phone Number:  Ebony Hail, daughter, [phone]/ Wife, Enid Derry [phone]    Current Level of Care: Hospital Recommended Level of Care: [place] Prior Approval Number:    Date Approved/Denied:   PASRR Number: [id number]  Discharge Plan: SNF    Current Diagnoses: Patient Active Problem List   Diagnosis Date Noted  . Pressure injury of skin 01/25/2020  . Pneumonia 01/23/2020    Orientation RESPIRATION BLADDER Height & Weight     Self, Time  O2(Nasal cannula 4L) Incontinent, Indwelling catheter Weight: 232 lb 5.8 oz (105.4 kg) Height:  '5\' 11"'$  (180.3 cm)  BEHAVIORAL SYMPTOMS/MOOD NEUROLOGICAL BOWEL NUTRITION STATUS      Incontinent Diet(Please see DC Summary)  AMBULATORY STATUS COMMUNICATION OF NEEDS Skin   Extensive Assist Verbally PU Stage and Appropriate Care(Stage I on sacrum)                       Personal Care Assistance Level of Assistance  Bathing, Feeding, Dressing Bathing Assistance: Maximum assistance Feeding assistance: Limited assistance Dressing Assistance: Limited assistance     Functional Limitations Info  Sight, Hearing, Speech Sight Info: Adequate Hearing Info: Adequate Speech Info: Adequate    SPECIAL CARE FACTORS FREQUENCY  PT (By licensed PT), OT (By licensed OT)     PT Frequency: 5x OT Frequency: 5x            Contractures Contractures Info: Not present    Additional Factors Info  Code Status, Allergies, Isolation Precautions Code Status Info: DNR Allergies Info: NKA      Isolation Precautions Info: COVID+     Current Medications (01/27/2020):  This is the current hospital active medication list Current Facility-Administered Medications  Medication Dose Route Frequency Provider Last Rate Last Admin  . albuterol (VENTOLIN HFA) 108 (90 Base) MCG/ACT inhaler 2 puff  2 puff Inhalation Q4H PRN Chesley Mires, MD   2 puff at 01/27/20 1535  . aspirin tablet 325 mg  325 mg Oral Daily Chesley Mires, MD   325 mg at 01/27/20 0829  . atorvastatin (LIPITOR) tablet 80 mg  80 mg Oral QHS Chesley Mires, MD   80 mg at 01/26/20 2232  . azithromycin (ZITHROMAX) 500 mg in sodium chloride 0.9 % 250 mL IVPB  500 mg Intravenous Daily Chesley Mires, MD 250 mL/hr at 01/26/20 1419 500 mg at 01/26/20 1419  . carvedilol (COREG) tablet 12.5 mg  12.5 mg Oral BID WC Chesley Mires, MD   12.5 mg at 01/27/20 0830  . cefTRIAXone (ROCEPHIN) 2 g in sodium chloride 0.9 % 100 mL IVPB  2 g Intravenous Daily Chesley Mires, MD 200 mL/hr at 01/27/20 1533 2 g at 01/27/20 1533  . chlorhexidine gluconate (MEDLINE KIT) (PERIDEX) 0.12 % solution 15 mL  15 mL Mouth Rinse BID Chesley Mires, MD   15 mL at 01/27/20 0844  . Chlorhexidine Gluconate Cloth 2 % PADS 6 each  6 each Topical Daily Chesley Mires, MD   6 each at 01/27/20 [phone]  . guaiFENesin-dextromethorphan (ROBITUSSIN DM) 100-10 MG/5ML syrup 5 mL  5 mL Oral Q4H PRN Magdalen Spatz, NP   5 mL at 01/26/20 2232  . hydrALAZINE (APRESOLINE) injection 5-10 mg  5-10 mg Intravenous Q4H PRN Chesley Mires, MD   10 mg at 01/27/20 0441  . isosorbide-hydrALAZINE (BIDIL) 20-37.5 MG per tablet 1 tablet  1 tablet Oral TID Thurnell Lose, MD   1 tablet at 01/27/20 1211  . MEDLINE mouth rinse  15 mL Mouth Rinse BID Chesley Mires, MD   15 mL at 01/27/20 0835  . mometasone-formoterol (DULERA) 100-5 MCG/ACT inhaler 2 puff  2 puff Inhalation BID Chesley Mires, MD   2 puff at 01/27/20 0831  . [START ON 02/02/2020] predniSONE (DELTASONE) tablet 10 mg  10 mg Oral Q breakfast Sood, Vineet,  MD      . umeclidinium bromide (INCRUSE ELLIPTA) 62.5 MCG/INH 1 puff  1 puff Inhalation Daily Chesley Mires, MD   1 puff at 01/27/20 6854     Discharge Medications: Please see discharge summary for a list of discharge medications.  Relevant Imaging Results:  Relevant Lab Results:   Additional Information SSN: [place] [id number]  [place], [place]

## 2020-01-27 NOTE — Significant Event (Addendum)
Rapid Response Rounding Note  Overview: Arrival Time: 1530 Event Type: Respiratory   Rounded on Mr. Demicco to find him lying in bed. Pt has accessory muscle use noted in his upper chest and abdomen. Upper airway expiratory wheeze/ high pitched sound heard on expiration. Lung sounds are diminished. Pt endorses his throat feels tight. RR 24, SpO2 98% on 4LNC-humidified, HR 58 bpm.   I spoke with RT and asked if pt could receive PRN Albuterol inhaler.    Interventions: PRN Albuterol inhaler given.   I spoke with Dr. Thedore Mins, who stated that pt's upper airway expiratory wheeze was present at his assessment. No tachypnea and pt is able to maintain oxygen saturation on 4LNC. MD recommended encouraging pt to cough and RN to provide oral suction as needed.  RN to call rapid response for further needs.   Jennye Moccasin

## 2020-01-27 NOTE — Progress Notes (Signed)
PROGRESS NOTE                                                                                                                                                                                                             Patient Demographics:    Jon Baker, is a 86 y.o. male, DOB - 1932/04/29, WFU:[id number]  Admit date - 01/23/2020   Admitting Physician Adewale Clearence Ped, MD  Outpatient Primary MD for the patient is [name], [name], MD  LOS - 4  CC - SOB     Brief Narrative  -  86 yo male smoker brought to ER unresponsive and had fever 102.7.  Intubated in ER and required pressors.  Found to have COVID 19 pneumonia.  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  Significant Hospital Events   2/14 SARS Cov2 >> positive 2/14 Flu A/B >> both negative Blood 2/14 >>  2/14 Admit 2/16 extubated 2/17 transfer out of ICU    Subjective:    Jon Baker today has, No headache, No chest pain, No abdominal pain - No Nausea, No new weakness tingling or numbness, does have cough and some shortness of breath, also has orthopnea.   Assessment  & Plan :     1.  Acute on chronic hypoxic respiratory failure in a patient with advanced COPD who uses 3 L of nasal cannula oxygen at home.  This was due to combination of COVID-19 pneumonia and now looks to have some element of acute on chronic systolic CHF with EF 20%.  He needed to be intubated, was extubated on the 17th of this month, was under the care of ICU and transferred out of ICU on 01/27/2020 under my care.  He is finishing his remdesivir course for COVID-19 pneumonia, steroids being tapered note he takes 10 mg of prednisone chronically at home, will start him on IV Lasix for fluid overload and acute on chronic systolic CHF.  Currently on 4 to 5 L nasal cannula oxygen which is much higher than his home dose, advance  activity, add flutter valve and I-S for pulmonary toiletry, try to taper down oxygen as tolerated.  Monitor closely.  2.  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  On 3 L nasal cannula oxygen at home, oxygen supplementation as needed, continue supportive care.  3.  Acute on chronic systolic CHF.  EF 35  to 40%.  Also had demand mismatch related non-ACS pattern troponin rise.  Placed on IV Lasix, aspirin, Coreg and Lipitor.  Chest pain-free.  Echo noted.  Seen by cardiology this admission.  4.  Acute metabolic encephalopathy secondary to hypoxia.  Improved.  5.  Hematuria.  Has Foley, this started in ICU, monitor.  Currently not on any chemical DVT prophylaxis.  6.  Elevated D-dimer.,  See #5 above, SCDs for now, check lower extremity venous duplex.  7.  CKD 3.  Follow be made.  8.  Deconditioning.  PT OT advance activity.  9. HTN - on Coreg will add Bidil.     Family Communication  :   Daughter Ebony Hail cell phone 01/27/2020  Code Status :  DNR  Disposition Plan  : Stay in the hospital for hypoxic respiratory failure, COVID-19 treatment, treatment of CHF.  Still extremely short of breath, also has ongoing hematuria.  Consults  : PCCM, cardiology  Procedures  :  CT Head  2/14 >> chronic small vessel changes, nothing acute  Echocardiogram 2/14 >> LVEF 35-40% with regional wall motion abnormalities, grade 1 diastolic dysfunction, mildly reduced RV function and mildly elevated PASP   DVT Prophylaxis  : SCDs, not on chemical prophylaxis due to hematuria  Lab Results  Component Value Date   PLT 174 01/27/2020    Diet :  Diet Order            Diet Heart Room service appropriate? Yes; Fluid consistency: Thin  Diet effective now               Inpatient Medications Scheduled Meds: . aspirin  325 mg Oral Daily  . atorvastatin  80 mg Oral QHS  . carvedilol  12.5 mg Oral BID WC  . chlorhexidine gluconate (MEDLINE KIT)  15 mL Mouth Rinse BID  .  Chlorhexidine Gluconate Cloth  6 each Topical Daily  . furosemide  80 mg Intravenous Once  . mouth rinse  15 mL Mouth Rinse BID  . mometasone-formoterol  2 puff Inhalation BID  . potassium chloride  40 mEq Oral Once  . [START ON 02/02/2020] predniSONE  10 mg Oral Q breakfast  . umeclidinium bromide  1 puff Inhalation Daily   Continuous Infusions: . azithromycin (ZITHROMAX) 500 MG IVPB (Vial-Mate [employer]or) 500 mg (01/26/20 1419)  . cefTRIAXone (ROCEPHIN)  IV 2 g (01/26/20 1335)  . magnesium sulfate bolus IVPB     PRN Meds:.albuterol, guaiFENesin-dextromethorphan, hydrALAZINE  Antibiotics  :   Anti-infectives (From admission, onward)   Start     Dose/Rate Route Frequency Ordered Stop   01/24/20 1400  azithromycin (ZITHROMAX) 500 mg in sodium chloride 0.9 % 250 mL IVPB     500 mg 250 mL/hr over 60 Minutes Intravenous Daily 01/23/20 1620 01/28/20 1359   01/24/20 1400  cefTRIAXone (ROCEPHIN) 2 g in sodium chloride 0.9 % 100 mL IVPB     2 g 200 mL/hr over 30 Minutes Intravenous Daily 01/23/20 1620 01/30/20 1359   01/24/20 1000  remdesivir 100 mg in sodium chloride 0.9 % 100 mL IVPB     100 mg 200 mL/hr over 30 Minutes Intravenous Daily 01/23/20 1853 01/27/20 0918   01/23/20 1900  remdesivir 200 mg in sodium chloride 0.9% 250 mL IVPB     200 mg 580 mL/hr over 30 Minutes Intravenous Once 01/23/20 1853 01/23/20 1952   01/23/20 1330  cefTRIAXone (ROCEPHIN) 2 g in sodium chloride 0.9 % 100 mL IVPB     2 g 200  mL/hr over 30 Minutes Intravenous  Once 01/23/20 1323 01/23/20 1421   01/23/20 1330  azithromycin (ZITHROMAX) 500 mg in sodium chloride 0.9 % 250 mL IVPB     500 mg 250 mL/hr over 60 Minutes Intravenous  Once 01/23/20 1324 01/23/20 1452          Objective:   Vitals:   01/27/20 0034 01/27/20 0430 01/27/20 0540 01/27/20 0635  BP: (!) 168/90 (!) 181/102 (!) 164/85   Pulse: 70 72 80   Resp: '18 20 19   '$ Temp: 97.8 F (36.6 C) 98.1 F (36.7 C)    TempSrc:  Oral    SpO2: 96% 95%  92%   Weight:    105.4 kg  Height:        SpO2: 92 % O2 Flow Rate (L/min): 4 L/min FiO2 (%): 40 %  Wt Readings from Last 3 Encounters:  01/27/20 105.4 kg     Intake/Output Summary (Last 24 hours) at 01/27/2020 1001 Last data filed at 01/27/2020 0553 Gross per 24 hour  Intake 2480 ml  Output 3200 ml  Net -720 ml     Physical Exam  Awake, alert, No FN deficits, [place].AT,PERRAL Supple Neck,No JVD, No cervical lymphadenopathy appriciated.  Symmetrical Chest wall movement, Good air movement bilaterally, ++ wheezing and rales RRR,No Gallops, Rubs or new Murmurs, No Parasternal Heave +ve B.Sounds, Abd Soft, No tenderness, No organomegaly appriciated, No rebound - guarding or rigidity.  Foley has bloody urine, No Cyanosis, Clubbing or edema, No new Rash or bruise    Data Review:    CBC Recent Labs  Lab 01/23/20 1319 01/23/20 1545 01/24/20 0333 01/24/20 0459 01/25/20 0326 01/26/20 0310 01/27/20 0500  WBC 12.0*  --  19.4*  --  24.2* 26.0* 20.6*  HGB 14.7   < > 12.8* 11.9* 12.6* 13.2 12.7*  HCT 45.4   < > 39.0 35.0* 37.9* 39.1 37.4*  PLT 200  --  146*  --  162 180 174  MCV 101.8*  --  98.7  --  98.2 97.0 96.4  MCH 33.0  --  32.4  --  32.6 32.8 32.7  MCHC 32.4  --  32.8  --  33.2 33.8 34.0  RDW 13.1  --  12.9  --  13.1 13.0 13.2  LYMPHSABS 0.9  --   --   --   --   --   --   MONOABS 1.1*  --   --   --   --   --   --   EOSABS 0.1  --   --   --   --   --   --   BASOSABS 0.1  --   --   --   --   --   --    < > = values in this interval not displayed.    Chemistries  Recent Labs  Lab 01/23/20 1319 01/23/20 1545 01/24/20 0333 01/24/20 0459 01/25/20 0326 01/26/20 0310 01/27/20 0500  NA 138   < > 141 139 141 143 141  K 4.8   < > 3.5 3.3* 3.6 3.7 3.5  CL 100  --  103  --  105 105 103  CO2 21*  --  22  --  '25 25 27  '$ GLUCOSE 91  --  133*  --  143* 112* 120*  BUN 24*  --  31*  --  48* 41* 36*  CREATININE 1.76*  --  1.69*  --  1.77* 1.18 1.07  CALCIUM 8.4*  --  7.8*   --  7.7* 8.0* 8.0*  MG  --   --  1.9  --   --   --   --   AST 97*  --  146*  --  145* 135*  --   ALT 79*  --  109*  --  147* 177*  --   ALKPHOS 74  --  58  --  51 56  --   BILITOT 1.6*  --  1.0  --  0.7 1.0  --    < > = values in this interval not displayed.   ------------------------------------------------------------------------------------------------------------------ No results for input(s): CHOL, HDL, LDLCALC, TRIG, CHOLHDL, LDLDIRECT in the last 72 hours.  No results found for: HGBA1C ------------------------------------------------------------------------------------------------------------------ No results for input(s): TSH, T4TOTAL, T3FREE, THYROIDAB in the last 72 hours.  Invalid input(s): FREET3 ------------------------------------------------------------------------------------------------------------------ No results for input(s): VITAMINB12, FOLATE, FERRITIN, TIBC, IRON, RETICCTPCT in the last 72 hours.  Coagulation profile Recent Labs  Lab 01/23/20 1319  INR 1.2    Recent Labs    01/25/20 0326 01/27/20 0802  DDIMER 5.39* 5.89*    Cardiac Enzymes No results for input(s): CKMB, TROPONINI, MYOGLOBIN in the last 168 hours.  Invalid input(s): CK ------------------------------------------------------------------------------------------------------------------    Component Value Date/Time   BNP 567.4 (H) 01/27/2020 0802    Micro Results Recent Results (from the past 240 hour(s))  Respiratory Panel by RT PCR (Flu A&B, Covid) - Nasopharyngeal Swab     Status: Abnormal   Collection Time: 01/23/20  1:06 PM   Specimen: Nasopharyngeal Swab  Result Value Ref Range Status   SARS Coronavirus 2 by RT PCR POSITIVE (A) NEGATIVE Final    Comment: RESULT CALLED TO, READ BACK BY AND VERIFIED WITH: P. PULLIAM, RN AT 8469 ON 01/23/20 BY C. JESSUP, MT. (NOTE) SARS-CoV-2 target nucleic acids are DETECTED. SARS-CoV-2 RNA is generally detectable in upper respiratory  specimens  during the acute phase of infection. Positive results are indicative of the presence of the identified virus, but do not rule out bacterial infection or co-infection with other pathogens not detected by the test. Clinical correlation with patient history and other diagnostic information is necessary to determine patient infection status. The expected result is Negative. Fact Sheet for Patients:  PinkCheek.be Fact Sheet for Healthcare Providers: GravelBags.it This test is not yet approved or cleared by the [place] FDA and  has been authorized for detection and/or diagnosis of SARS-CoV-2 by FDA under an Emergency Use Authorization (EUA).  This EUA will remain in effect (meaning this tes t can be used) for the duration of  the COVID-19 declaration under Section 564(b)(1) of the Act, 21 U.S.C. section 360bbb-3(b)(1), unless the authorization is terminated or revoked sooner.    Influenza A by PCR NEGATIVE NEGATIVE Final   Influenza B by PCR NEGATIVE NEGATIVE Final    Comment: (NOTE) The Xpert Xpress SARS-CoV-2/FLU/RSV assay is intended as an aid in  the diagnosis of influenza from Nasopharyngeal swab specimens and  should not be used as a sole basis for treatment. Nasal washings and  aspirates are unacceptable for Xpert Xpress SARS-CoV-2/FLU/RSV  testing. Fact Sheet for Patients: PinkCheek.be Fact Sheet for Healthcare Providers: GravelBags.it This test is not yet approved or cleared by the [place] FDA and  has been authorized for detection and/or diagnosis of SARS-CoV-2 by  FDA under an Emergency Use Authorization (EUA). This EUA will remain  in effect (meaning this test can be used) for the duration of the  Covid-19 declaration under Section 564(b)(1)  of the Act, 21  U.S.C. section 360bbb-3(b)(1), unless the authorization is  terminated or  revoked. Performed at [facility], [place] [address], [place], [place] [address]   Culture, blood (routine x 2)     Status: None (Preliminary result)   Collection Time: 01/23/20  1:21 PM   Specimen: BLOOD RIGHT ARM  Result Value Ref Range Status   Specimen Description BLOOD RIGHT ARM  Final   Special Requests   Final    BOTTLES DRAWN AEROBIC ONLY Blood Culture results may not be optimal due to an inadequate volume of blood received in culture bottles   Culture   Final    NO GROWTH 4 DAYS Performed at [facility], [place] [address] [place], [place] [address]    Report Status PENDING  Incomplete  Culture, blood (routine x 2)     Status: None (Preliminary result)   Collection Time: 01/23/20  1:47 PM   Specimen: BLOOD RIGHT FOREARM  Result Value Ref Range Status   Specimen Description BLOOD RIGHT FOREARM  Final   Special Requests   Final    BOTTLES DRAWN AEROBIC AND ANAEROBIC Blood Culture results may not be optimal due to an inadequate volume of blood received in culture bottles   Culture   Final    NO GROWTH 4 DAYS Performed at [facility], [place] [address], [place], [place] [address]    Report Status PENDING  Incomplete    Radiology Reports DG Abd 1 View  Result Date: 01/23/2020 CLINICAL DATA:  Check feeding catheter placement EXAM: ABDOMEN - 1 VIEW COMPARISON:  None. FINDINGS: Gastric catheter is noted with the tip in the distal stomach. Scattered large and small bowel gas is noted. IMPRESSION: Gastric catheter within the distal stomach. Electronically Signed   By: Inez Catalina M.D.   On: 01/23/2020 16:38   CT Head Wo Contrast  Result Date: 01/23/2020 CLINICAL DATA:  Encephalopathy, unresponsive EXAM: CT HEAD WITHOUT CONTRAST TECHNIQUE: Contiguous axial images were obtained from the base of the skull through the vertex without intravenous contrast. COMPARISON:  07/30/2010 FINDINGS: Brain: Confluent hypodensities throughout the periventricular white matter are  compatible with age-indeterminate small vessel ischemic changes, favor chronic. No other signs of acute infarct or hemorrhage. Lateral ventricles and remaining midline structures are unremarkable. No acute extra-axial fluid collections. No mass effect. Vascular: No hyperdense vessel or unexpected calcification. Skull: Normal. Negative for fracture or focal lesion. Sinuses/Orbits: Mucoperiosteal thickening is seen within the bilateral ethmoid air cells. Other: None IMPRESSION: 1. Likely chronic small-vessel ischemic changes throughout the white matter. 2. No acute intracranial process otherwise. Electronically Signed   By: Randa Ngo M.D.   On: 01/23/2020 15:02   DG Chest Portable 1 View  Result Date: 01/23/2020 CLINICAL DATA:  Respiratory failure EXAM: PORTABLE CHEST 1 VIEW COMPARISON:  June 24, 2019 FINDINGS: Endotracheal tube is identified distal tip 6.3 cm from carina. Increased pulmonary interstitium is identified in the bilateral upper lobes and right mid lung. There is no pleural effusion. The aorta is tortuous. The heart size is upper limits are normal. A feeding tube is identified with distal tip not included on film. IMPRESSION: Increased pulmonary interstitium in the bilateral upper lobes and right mid lung, findings are suspicious for developing pneumonias. Electronically Signed   By: Abelardo Diesel M.D.   On: 01/23/2020 13:59   ECHOCARDIOGRAM LIMITED  Result Date: 01/23/2020    ECHOCARDIOGRAM REPORT   Patient Name:   HALO LASKI Date of Exam:  01/23/2020 Medical Rec #:  [id number]       Height:       71.0 in Accession #:    [id number]      Weight: Date of Birth:  October 15, 1932       BSA: Patient Age:    72 years        BP:           99/74 mmHg Patient Gender: M               HR:           62 bpm. Exam Location:  Inpatient Procedure: Limited Echo, Cardiac Doppler and Limited Color Doppler Indications:    Cardiomegaly 429.3  History:        Patient has no prior history of Echocardiogram  examinations.                 CHF, CAD; COPD and Covid.  Sonographer:    Johny Chess Referring Phys: [id number] [place]  1. Left ventricular ejection fraction, by estimation, is 35 to 40%. The left ventricle has moderately decreased function. The left ventricle demonstrates regional wall motion abnormalities (see scoring diagram/findings for description). The left ventricular internal cavity size was mildly dilated. Left ventricular diastolic parameters are consistent with Grade I diastolic dysfunction (impaired relaxation). There is moderate hypokinesis of the left ventricular, mid-apical anteroseptal wall, anterior wall and apical segment.  2. Right ventricular systolic function is mildly reduced. The right ventricular size is mildly enlarged. There is mildly elevated pulmonary artery systolic pressure.  3. Left atrial size was mildly dilated.  4. The mitral valve is normal in structure and function. No evidence of mitral valve regurgitation.  5. The aortic valve is tricuspid. Aortic valve regurgitation is not visualized. Mild to moderate aortic valve sclerosis/calcification is present, without any evidence of aortic stenosis.  6. The inferior vena cava is dilated in size with <50% respiratory variability, suggesting right atrial pressure of 15 mmHg. This is a nonspecific finding during positive pressure ventilation. FINDINGS  Left Ventricle: Left ventricular ejection fraction, by estimation, is 35 to 40%. The left ventricle has moderately decreased function. The left ventricle demonstrates regional wall motion abnormalities. Moderate hypokinesis of the left ventricular, mid-apical anteroseptal wall, anterior wall and apical segment. The left ventricular internal cavity size was mildly dilated. There is no left ventricular hypertrophy. Left ventricular diastolic parameters are consistent with Grade I diastolic dysfunction (impaired relaxation). Normal left ventricular filling pressure.  LV  Wall Scoring: The mid and distal anterior wall, mid anteroseptal segment, and apex are hypokinetic. Right Ventricle: The right ventricular size is mildly enlarged. No increase in right ventricular wall thickness. Right ventricular systolic function is mildly reduced. There is mildly elevated pulmonary artery systolic pressure. The tricuspid regurgitant  velocity is 1.94 m/s, and with an assumed right atrial pressure of 15 mmHg, the estimated right ventricular systolic pressure is 43.1 mmHg. Left Atrium: Left atrial size was mildly dilated. Right Atrium: Right atrial size was not well visualized. Pericardium: There is no evidence of pericardial effusion. Mitral Valve: The mitral valve is normal in structure and function. No evidence of mitral valve regurgitation. Tricuspid Valve: The tricuspid valve is normal in structure. Tricuspid valve regurgitation is not demonstrated. Aortic Valve: The aortic valve is tricuspid. Aortic valve regurgitation is not visualized. Mild to moderate aortic valve sclerosis/calcification is present, without any evidence of aortic stenosis. Pulmonic Valve: The pulmonic valve was not well visualized. Pulmonic valve regurgitation is not  visualized. Aorta: The aortic root is normal in size and structure. Venous: The inferior vena cava is dilated in size with less than 50% respiratory variability, suggesting right atrial pressure of 15 mmHg. IAS/Shunts: No atrial level shunt detected by color flow Doppler.  LEFT VENTRICLE PLAX 2D LVIDd:         5.90 cm  Diastology LVIDs:         4.80 cm  LV e' lateral:   4.13 cm/s LV PW:         0.80 cm  LV E/e' lateral: 11.0 LV IVS:        1.10 cm  LV e' medial:    4.13 cm/s LVOT diam:     2.20 cm  LV E/e' medial:  11.0 LVOT Area:     3.80 cm  LEFT ATRIUM LA diam:    3.40 cm   AORTA Ao Root diam: 3.60 cm MITRAL VALVE               TRICUSPID VALVE MV Area (PHT): 3.60 cm    TR Peak grad:   15.1 mmHg MV Decel Time: 211 msec    TR Vmax:        194.00 cm/s MV E  velocity: 45.40 cm/s MV A velocity: 87.40 cm/s  SHUNTS MV E/A ratio:  0.52        Systemic Diam: 2.20 cm Dani Gobble Croitoru MD Electronically signed by Sanda Klein MD Signature Date/Time: 01/23/2020/5:37:49 PM    Final     Time Spent in minutes  30   Lala Lund M.D on 01/27/2020 at 10:01 AM  To page go to [email or web address] - password [facility]

## 2020-01-27 NOTE — TOC Initial Note (Signed)
Transition of Care [facility]) - Initial/Assessment Note    Patient Details  Name: Jon Baker MRN: [id number] Date of Birth: [birth year]  [facility] CM/SW Contact:    Benard Halsted, LCSW Phone Number: 01/27/2020, 3:46 PM  Clinical Narrative:                 CSW received consult for possible SNF placement at time of discharge. CSW spoke with patient's daughter Ebony Hail (left voicemail for spouse, Enid Derry) regarding PT recommendation of SNF placement at time of discharge. Patient's daughter reported that patient's spouse may be unable to care for patient at their home given patient's current physical needs and fall risk. They are requesting CIR. CSW explained that CIR is unable to accept patients with COVID until 21 days from first positive test result. Patient's spouse likely prefers to take patient back home rather than SNF, but Ebony Hail provided CSW permission to send referral out to see which facilities are available (except [place]). Patient's daughter expressed being hopeful for rehab and for patient to feel better soon. No further questions reported at this time. CSW to continue to follow and assist with discharge planning needs.     Barriers to Discharge: Continued Medical Work up   Patient Goals and CMS Choice Patient states their goals for this hospitalization and ongoing recovery are:: For patient to become stronger and return home CMS Medicare.gov Compare Post Acute Care list provided to:: Patient Represenative (must comment)(Daughter, Ebony Hail) Choice offered to / list presented to : Adult Children  Expected Discharge Plan and Services   In-house Referral: Clinical Social Work   Post Acute Care Choice: (Uncertain at this time) Living arrangements for the past 2 months: [place]                                      Prior Living Arrangements/Services Living arrangements for the past 2 months: Single Family Home Lives with:: Spouse Patient  language and need for interpreter reviewed:: Yes Do you feel safe going back to the place where you live?: Yes      Need for Family Participation in Patient Care: Yes (Comment) Care giver support system in place?: Yes (comment)   Criminal Activity/Legal Involvement Pertinent to Current Situation/Hospitalization: No - Comment as needed  Activities of Daily Living      Permission Sought/Granted Permission sought to share information with : Facility Sport and exercise psychologist, Family Supports Permission granted to share information with : Yes, Verbal Permission Granted  Share Information with NAME: Shirley/Allison/Elaine  Permission granted to share info w AGENCY: SNFs/CIR/HH  Permission granted to share info w Relationship: Spouse/Daughters  Permission granted to share info w Contact Information: [phone]  Emotional Assessment   Attitude/Demeanor/Rapport: Unable to Assess Affect (typically observed): Unable to Assess Orientation: : Oriented to Self, Oriented to  Time Alcohol / Substance Use: Not Applicable Psych Involvement: No (comment)  Admission diagnosis:  Pneumonia [J18.9] Encounter for feeding tube placement [Z46.59] Septic shock ([place]) [A41.9, R65.21] Acute respiratory failure, unspecified whether with hypoxia or hypercapnia ([place]) [J96.00] Patient Active Problem List   Diagnosis Date Noted  . Pressure injury of skin 01/25/2020  . Pneumonia 01/23/2020   PCP:  Cassandria Anger, MD Pharmacy:   [id number] - [place], [place] [address] Phone: [phone] Fax: [phone]  [place], [place].  [address] [employer] [address] [place] [address] Phone: [phone] Fax: [phone]     Social Determinants of Health (SDOH) Interventions    Readmission Risk Interventions No flowsheet data found.

## 2020-01-27 NOTE — Progress Notes (Signed)
   01/27/20 1840  What Happened  Was fall witnessed? No  Was patient injured? No  Patient found on floor  Found by Staff-comment Bettina Gavia RN and Ronalee Belts, RN)  Stated prior activity to/from bed, chair, or stretcher  Follow Up  MD notified  (Dr. Candiss Norse)  Time MD notified [phone]  Family notified No - patient refusal  Additional tests No  Adult Fall Risk Assessment  Risk Factor Category (scoring not indicated) Fall has occurred during this admission (document High fall risk)  Patient Fall Risk Level High fall risk  Adult Fall Risk Interventions  Required Bundle Interventions *See Row Information* High fall risk - low, moderate, and high requirements implemented  Additional Interventions Use of appropriate toileting equipment (bedpan, BSC, etc.);Room near nurses station  Screening for Fall Injury Risk (To be completed on HIGH fall risk patients) - Assessing Need for Low Bed  Risk For Fall Injury- Low Bed Criteria 85 years or older  Will Implement Low Bed and Floor Mats No - Criteria no longer met for low bed  Vitals  Temp 98 F (36.7 C)  Temp Source Oral  BP (!) 141/118  MAP (mmHg) 127  BP Location Left Arm  BP Method Automatic  Patient Position (if appropriate) Sitting  Pulse Rate 76  Pulse Rate Source Monitor  ECG Heart Rate 80  Resp 19  Oxygen Therapy  SpO2 91 %  O2 Device HFNC  O2 Flow Rate (L/min) 4 L/min  Pain Assessment  Pain Scale 0-10  Pain Score 0  Neurological  Neuro (WDL) X  Level of Consciousness Alert  Orientation Level Oriented to person;Oriented to time;Disoriented to place;Disoriented to situation  Chartered certified accountant commands;Memory impairment  Speech Clear  Pupil Assessment  No  Musculoskeletal  Musculoskeletal (WDL) X  Assistive Device BSC;Front wheel walker  Generalized Weakness Yes  Weight Bearing Restrictions No  Integumentary  Integumentary (WDL) X  Skin Color Appropriate for ethnicity  Skin Condition Dry  Ecchymosis Location Abdomen;Arm   Ecchymosis Location Orientation Right;Left  Ecchymosis Intervention Other (Comment) (assessed)  Moisture Associated Skin Damage Location Buttocks  Moisture Associated Skin Damage Orientation Right;Left  Moisture Associated Skin Damage Intervention Cleansed  Skin Turgor Non-tenting

## 2020-01-27 NOTE — Progress Notes (Signed)
Pt's daughter Consuella Lose called to check on her father. Daughter would like to be called in the morning by doctor to discuss treatment plans for her father. Will continue to monitor pt. Nelda Marseille, RN

## 2020-01-27 NOTE — Progress Notes (Signed)
NAME:  Jon Baker, MRN:  [id number], DOB:  08-13-1932, LOS: 4 ADMISSION DATE:  01/23/2020, CONSULTATION DATE:  01/23/20 REFERRING MD:  Regenia Skeeter - EM, CHIEF COMPLAINT:  Acute respiratory failure, AMS  Brief History   86 yo male smoker brought to ER unresponsive and had fever 102.7.  Intubated in ER and required pressors.  Found to have COVID 19 pneumonia.  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  Past Medical History  COPD, ILD, combined CHF, HTN, CAD, Depression, HLD, Gallstone pancreatitis, RBBB  Significant Hospital Events   2/14 Admit 2/16 extubated 2/17 transfer out of ICU  Consults:  Cardiology  Procedures:  2/14 ETT > 2/16  Significant Diagnostic Tests:  CT Head  2/14 >> chronic small vessel changes, nothing acute Echocardiogram 2/14 >> LVEF 35-40% with regional wall motion abnormalities, grade 1 diastolic dysfunction, mildly reduced RV function and mildly elevated PASP  Micro Data:  2/14 SARS Cov2 >> positive 2/14 Flu A/B >> both negative Blood 2/14 >>   Antimicrobials:  Remdesivir 2/14 > Dexamethasone 2/14 >  COVID Therapy:  Ceftriaxone 2/14 >  Azithromycin 2/14 >   Interim history/subjective:  Breathing comfortably this morning with no complaints. Seems a bit confused and is reaching for IV pumps.   Objective   Blood pressure (!) 164/85, pulse 80, temperature 98.1 F (36.7 C), temperature source Oral, resp. rate 19, height 5\' 11"  (1.803 m), weight 105.4 kg, SpO2 92 %.        Intake/Output Summary (Last 24 hours) at 01/27/2020 1126 Last data filed at 01/27/2020 0830 Gross per 24 hour  Intake 2680 ml  Output 3200 ml  Net -520 ml   Filed Weights   01/25/20 0441 01/26/20 0412 01/27/20 0635  Weight: 107.4 kg 106.8 kg 105.4 kg    Examination: General:  Elderly gentleman in NAD Neuro: Alert, oriented, non-focal. Does seem to have some delirium.  Reaching for IV pole and tubing.  HEENT:  [place]/AT, No JVD noted, PERRL Cardiovascular:  RRR, no MRG Lungs:  Upper airway wheeze only on forceful expiration. Otherwise clear.  Abdomen:  Soft, non-distended, non-tender.  Musculoskeletal:  No acute deformity or ROM limitation.  Skin:  Intact, MMM  Resolved Hospital Problem list   Anion gap metabolic acidosis, Dysphagia  Assessment & Plan:   Acute on chronic hypoxic respiratory failure from COVID 19 pneumonia with possible bacterial superinfection. - day 5/5 of remdesivir - day 5/10 of decadron - day 5/5 of ABx - f/u CXR intermittently - goal SpO2 88 to 95%. 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 - Continue dulera, incruse - prn albuterol - on 10 mg prednisone daily as outpt >> resume after he has completed course of decadron  Elevated troponin from demand ischemia. Hx of CAD, chronic combined CHF, HTN. - Cardiology has signed off with recommendations for supportive care.  - continue ASA - continue coreg, lipitor - diuresis as tolerated  Hematuria. Elevated D Dimer - per primary (venous doppler pending)   Best practice:  Diet: heart healthy DVT prophylaxis: SCD GI prophylaxis: no longer indicated Mobility: OOB to chair Code Status: DNR Disposition: Patient updated  Labs    CMP Latest Ref Rng & Units 01/27/2020 01/26/2020 01/25/2020  Glucose 70 - 99 mg/dL 120(H) 112(H) 143(H)  BUN 8 - 23 mg/dL 36(H) 41(H) 48(H)  Creatinine 0.61 -  1.24 mg/dL 2.55 0.01 6.42(X)  Sodium 135 - 145 mmol/L 141 143 141  Potassium 3.5 - 5.1 mmol/L 3.5 3.7 3.6  Chloride 98 - 111 mmol/L 103 105 105  CO2 22 - 32 mmol/L 27 25 25   Calcium 8.9 - 10.3 mg/dL 8.0(L) 8.0(L) 7.7(L)  Total Protein 6.5 - 8.1 g/dL - 5.8(L) 5.1(L)  Total Bilirubin 0.3 - 1.2 mg/dL - 1.0 0.7  Alkaline Phos 38 - 126 U/L - 56 51  AST 15 - 41 U/L - 135(H) 145(H)  ALT 0 - 44 U/L - 177(H) 147(H)    CBC Latest Ref Rng & Units  01/27/2020 01/26/2020 01/25/2020  WBC 4.0 - 10.5 K/uL 20.6(H) 26.0(H) 24.2(H)  Hemoglobin 13.0 - 17.0 g/dL 12.7(L) 13.2 12.6(L)  Hematocrit 39.0 - 52.0 % 37.4(L) 39.1 37.9(L)  Platelets 150 - 400 K/uL 174 180 162    ABG    Component Value Date/Time   PHART 7.395 01/24/2020 0459   PCO2ART 41.1 01/24/2020 0459   PO2ART 115.0 (H) 01/24/2020 0459   HCO3 25.2 01/24/2020 0459   TCO2 26 01/24/2020 0459   ACIDBASEDEF 1.0 01/23/2020 1545   O2SAT 98.0 01/24/2020 0459    CBG (last 3)  Recent Labs    01/24/20 1655 01/25/20 1931 01/26/20 0738  GLUCAP 137* 114* 104*     01/28/20, AGACNP-BC [facility]  See Amion for personal pager PCCM on call pager [phone]  01/27/2020 11:34 AM

## 2020-01-27 NOTE — Progress Notes (Signed)
Bilateral lower extremity venous duplex has been completed. Preliminary results can be found in CV Proc through chart review.   01/27/20 4:17 PM Olen Cordial RVT

## 2020-01-28 ENCOUNTER — Inpatient Hospital Stay (HOSPITAL_COMMUNITY): Payer: Medicare Other

## 2020-01-28 ENCOUNTER — Telehealth: Payer: Self-pay | Admitting: Internal Medicine

## 2020-01-28 LAB — COMPREHENSIVE METABOLIC PANEL
ALT: 107 U/L — ABNORMAL HIGH (ref 0–44)
AST: 77 U/L — ABNORMAL HIGH (ref 15–41)
Albumin: 2.8 g/dL — ABNORMAL LOW (ref 3.5–5.0)
Alkaline Phosphatase: 44 U/L (ref 38–126)
Anion gap: 9 (ref 5–15)
BUN: 38 mg/dL — ABNORMAL HIGH (ref 8–23)
CO2: 31 mmol/L (ref 22–32)
Calcium: 7.9 mg/dL — ABNORMAL LOW (ref 8.9–10.3)
Chloride: 103 mmol/L (ref 98–111)
Creatinine, Ser: 1.27 mg/dL — ABNORMAL HIGH (ref 0.61–1.24)
GFR calc Af Amer: 59 mL/min — ABNORMAL LOW (ref >60)
GFR calc non Af Amer: 51 mL/min — ABNORMAL LOW (ref >60)
Glucose, Bld: 119 mg/dL — ABNORMAL HIGH (ref 70–99)
Potassium: 3.5 mmol/L (ref 3.5–5.1)
Sodium: 143 mmol/L (ref 135–145)
Total Bilirubin: 0.9 mg/dL (ref 0.3–1.2)
Total Protein: 5 g/dL — ABNORMAL LOW (ref 6.5–8.1)

## 2020-01-28 LAB — CULTURE, BLOOD (ROUTINE X 2)
Culture: NO GROWTH
Culture: NO GROWTH

## 2020-01-28 LAB — CBC WITH DIFFERENTIAL/PLATELET
Abs Immature Granulocytes: 0.11 10*3/uL — ABNORMAL HIGH (ref 0.00–0.07)
Basophils Absolute: 0 10*3/uL (ref 0.0–0.1)
Basophils Relative: 0 %
Eosinophils Absolute: 0 10*3/uL (ref 0.0–0.5)
Eosinophils Relative: 0 %
HCT: 32.7 % — ABNORMAL LOW (ref 39.0–52.0)
Hemoglobin: 11 g/dL — ABNORMAL LOW (ref 13.0–17.0)
Immature Granulocytes: 1 %
Lymphocytes Relative: 3 %
Lymphs Abs: 0.6 10*3/uL — ABNORMAL LOW (ref 0.7–4.0)
MCH: 32.5 pg (ref 26.0–34.0)
MCHC: 33.6 g/dL (ref 30.0–36.0)
MCV: 96.7 fL (ref 80.0–100.0)
Monocytes Absolute: 1.1 10*3/uL — ABNORMAL HIGH (ref 0.1–1.0)
Monocytes Relative: 7 %
Neutro Abs: 14.9 10*3/uL — ABNORMAL HIGH (ref 1.7–7.7)
Neutrophils Relative %: 89 %
Platelets: 150 10*3/uL (ref 150–400)
RBC: 3.38 MIL/uL — ABNORMAL LOW (ref 4.22–5.81)
RDW: 13.1 % (ref 11.5–15.5)
WBC: 16.7 10*3/uL — ABNORMAL HIGH (ref 4.0–10.5)
nRBC: 0 % (ref 0.0–0.2)

## 2020-01-28 LAB — D-DIMER, QUANTITATIVE: D-Dimer, Quant: 7.43 ug/mL-FEU — ABNORMAL HIGH (ref 0.00–0.50)

## 2020-01-28 LAB — BRAIN NATRIURETIC PEPTIDE: B Natriuretic Peptide: 349.6 pg/mL — ABNORMAL HIGH (ref 0.0–100.0)

## 2020-01-28 LAB — PROCALCITONIN: Procalcitonin: 0.85 ng/mL

## 2020-01-28 LAB — C-REACTIVE PROTEIN: CRP: 0.8 mg/dL (ref <1.0)

## 2020-01-28 LAB — MAGNESIUM: Magnesium: 2.1 mg/dL (ref 1.7–2.4)

## 2020-01-28 MED ORDER — SPIRONOLACTONE 25 MG PO TABS
25.0000 mg | ORAL_TABLET | Freq: Once | ORAL | Status: AC
  Administered 2020-01-28: 25 mg via ORAL
  Filled 2020-01-28: qty 1

## 2020-01-28 MED ORDER — ALBUTEROL SULFATE HFA 108 (90 BASE) MCG/ACT IN AERS
4.0000 | INHALATION_SPRAY | RESPIRATORY_TRACT | Status: DC
  Administered 2020-01-28 – 2020-02-01 (×24): 4 via RESPIRATORY_TRACT

## 2020-01-28 MED ORDER — METHYLPREDNISOLONE SODIUM SUCC 125 MG IJ SOLR
60.0000 mg | Freq: Two times a day (BID) | INTRAMUSCULAR | Status: DC
  Administered 2020-01-28 (×2): 60 mg via INTRAVENOUS
  Filled 2020-01-28 (×2): qty 2

## 2020-01-28 MED ORDER — FUROSEMIDE 10 MG/ML IJ SOLN
60.0000 mg | Freq: Once | INTRAMUSCULAR | Status: AC
  Administered 2020-01-28: 09:00:00 60 mg via INTRAVENOUS
  Filled 2020-01-28: qty 6

## 2020-01-28 MED ORDER — AMLODIPINE BESYLATE 10 MG PO TABS
10.0000 mg | ORAL_TABLET | Freq: Every day | ORAL | Status: DC
  Administered 2020-01-28 – 2020-02-01 (×5): 10 mg via ORAL
  Filled 2020-01-28 (×5): qty 1

## 2020-01-28 MED ORDER — POTASSIUM CHLORIDE CRYS ER 20 MEQ PO TBCR
40.0000 meq | EXTENDED_RELEASE_TABLET | Freq: Once | ORAL | Status: AC
  Administered 2020-01-28: 40 meq via ORAL
  Filled 2020-01-28: qty 2

## 2020-01-28 NOTE — Progress Notes (Signed)
PROGRESS NOTE                                                                                                                                                                                                             Patient Demographics:    Jon Baker, is a 86 y.o. male, DOB - 12-28-1931, LGX:[id number]  Admit date - 01/23/2020   Admitting Physician Adewale Clearence Ped, MD  Outpatient Primary MD for the patient is [name], [name], MD  LOS - 5  CC - SOB     Brief Narrative  -  86 yo male smoker brought to ER unresponsive and had fever 102.7.  Intubated in ER and required pressors.  Found to have COVID 19 pneumonia.  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  Significant Hospital Events   2/14 SARS Cov2 >> positive 2/14 Flu A/B >> both negative Blood 2/14 >>  2/14 Admit 2/16 extubated 2/17 transfer out of ICU    Subjective:   Patient in bed is feeling little short of breath, on inspection his oxygen nasal cannula is not in his naris but on his cheeks, denies any chest or abdominal pain, no focal weakness.   Assessment  & Plan :     1.  Acute on chronic hypoxic respiratory failure in a patient with advanced COPD who uses 3 L of nasal cannula oxygen at home along with ILD.  This was due to combination of COVID-19 pneumonia and now looks to have some element of acute on chronic systolic CHF with EF 81% superimposed on his advanced COPD.  He needed to be intubated, was extubated on the 17th of this month, was under the care of ICU and transferred out of ICU on 01/27/2020 under my care.  He is finishing his remdesivir course for COVID-19 pneumonia, continues to have wheezing hence I will continue IV steroids, of note he takes 10 mg of prednisone chronically at home, will continue IV Lasix for fluid overload and acute on chronic systolic CHF.  Currently on 4 to  5 L nasal cannula oxygen which is much higher than his home dose of 3 L, advance activity, add flutter valve and I-S for pulmonary toiletry, try to taper down oxygen as tolerated.  Monitor closely.  2.  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  On 3 L nasal cannula oxygen at home, oxygen  supplementation as needed, continue supportive care, see above.  3.  Acute on chronic systolic CHF.  EF 35 to 40%.  Also had demand mismatch related non-ACS pattern troponin rise.  Placed on IV Lasix, aspirin, Coreg and Lipitor.  Chest pain-free.  Echo noted.  Seen by cardiology this admission.  4.  Acute metabolic encephalopathy secondary to hypoxia.  Improved.  5.  Hematuria.  Has Foley, this started in ICU, monitor.  Currently not on any chemical DVT prophylaxis.  6.  Elevated D-dimer.,  See #5 above, SCDs for now, check lower extremity venous duplex.  7.  CKD 3.  Follow be made.  8.  Deconditioning.  PT OT advance activity.  9. HTN - was on Coreg, BiDil has been added, blood pressure still high will add Norvasc as well and monitor.  10.  AKI .  Baseline creatinine around 1 per daughter who pulled his old records, for now needs diuresis will monitor cautiously.     Family Communication  :   Daughter Jon Baker cell phone 01/27/2020, 01/28/2020  Code Status :  DNR  Disposition Plan  : Stay in the hospital for hypoxic respiratory failure, COVID-19 treatment, treatment of CHF.  Still extremely short of breath, also has ongoing hematuria.  Consults  : PCCM, cardiology  Procedures  :  CT Head  2/14 >> chronic small vessel changes, nothing acute  Echocardiogram 2/14 >> LVEF 35-40% with regional wall motion abnormalities, grade 1 diastolic dysfunction, mildly reduced RV function and mildly elevated PASP   DVT Prophylaxis  : SCDs, not on chemical prophylaxis due to hematuria  Lab Results  Component Value Date   PLT 150 01/28/2020    Diet :  Diet Order            Diet  Heart Room service appropriate? Yes; Fluid consistency: Thin  Diet effective now               Inpatient Medications Scheduled Meds:  albuterol  4 puff Inhalation Q4H   amLODipine  10 mg Oral Daily   aspirin  325 mg Oral Daily   atorvastatin  80 mg Oral QHS   carvedilol  12.5 mg Oral BID WC   chlorhexidine gluconate (MEDLINE KIT)  15 mL Mouth Rinse BID   Chlorhexidine Gluconate Cloth  6 each Topical Daily   isosorbide-hydrALAZINE  1 tablet Oral TID   mouth rinse  15 mL Mouth Rinse BID   methylPREDNISolone (SOLU-MEDROL) injection  60 mg Intravenous Q12H   mometasone-formoterol  2 puff Inhalation BID   umeclidinium bromide  1 puff Inhalation Daily   Continuous Infusions:  cefTRIAXone (ROCEPHIN)  IV 2 g (01/27/20 1533)   PRN Meds:.guaiFENesin-dextromethorphan, hydrALAZINE  Antibiotics  :   Anti-infectives (From admission, onward)   Start     Dose/Rate Route Frequency Ordered Stop   01/24/20 1400  azithromycin (ZITHROMAX) 500 mg in sodium chloride 0.9 % 250 mL IVPB  Status:  Discontinued     500 mg 250 mL/hr over 60 Minutes Intravenous Daily 01/23/20 1620 01/27/20 1605   01/24/20 1400  cefTRIAXone (ROCEPHIN) 2 g in sodium chloride 0.9 % 100 mL IVPB     2 g 200 mL/hr over 30 Minutes Intravenous Daily 01/23/20 1620 01/30/20 1359   01/24/20 1000  remdesivir 100 mg in sodium chloride 0.9 % 100 mL IVPB     100 mg 200 mL/hr over 30 Minutes Intravenous Daily 01/23/20 1853 01/27/20 0918   01/23/20 1900  remdesivir 200 mg in sodium chloride 0.9%  250 mL IVPB     200 mg 580 mL/hr over 30 Minutes Intravenous Once 01/23/20 1853 01/23/20 1952   01/23/20 1330  cefTRIAXone (ROCEPHIN) 2 g in sodium chloride 0.9 % 100 mL IVPB     2 g 200 mL/hr over 30 Minutes Intravenous  Once 01/23/20 1323 01/23/20 1421   01/23/20 1330  azithromycin (ZITHROMAX) 500 mg in sodium chloride 0.9 % 250 mL IVPB     500 mg 250 mL/hr over 60 Minutes Intravenous  Once 01/23/20 1324 01/23/20 1452           Objective:   Vitals:   01/28/20 0000 01/28/20 0519 01/28/20 0520 01/28/20 0800  BP: 131/72  (!) 156/88 (!) 130/117  Pulse: (!) 56  64 70  Resp: 17  (!) 23 17  Temp:   (!) 97.4 F (36.3 C) 98.4 F (36.9 C)  TempSrc:   Oral Axillary  SpO2: 95%  96% 96%  Weight:  104.7 kg    Height:        SpO2: 96 % O2 Flow Rate (L/min): 4 L/min FiO2 (%): 40 %  Wt Readings from Last 3 Encounters:  01/28/20 104.7 kg     Intake/Output Summary (Last 24 hours) at 01/28/2020 0913 Last data filed at 01/28/2020 0518 Gross per 24 hour  Intake 730 ml  Output 2750 ml  Net -2020 ml     Physical Exam  Awake Alert, No new F.N deficits, Normal affect [employer].AT,PERRAL Supple Neck,No JVD, No cervical lymphadenopathy appriciated.  Symmetrical Chest wall movement, Good air movement bilaterally, continues to have intense wheezing and few crackles, some upper airway wheezing but no stridor RRR,No Gallops, Rubs or new Murmurs, No Parasternal Heave +ve B.Sounds, Abd Soft, No tenderness, No organomegaly appriciated, No rebound - guarding or rigidity. No Cyanosis, Clubbing or edema, No new Rash or bruise     Data Review:    CBC Recent Labs  Lab 01/23/20 1319 01/23/20 1545 01/24/20 0333 01/24/20 0333 01/24/20 0459 01/25/20 0326 01/26/20 0310 01/27/20 0500 01/28/20 0713  WBC 12.0*   < > 19.4*  --   --  24.2* 26.0* 20.6* 16.7*  HGB 14.7   < > 12.8*   < > 11.9* 12.6* 13.2 12.7* 11.0*  HCT 45.4   < > 39.0   < > 35.0* 37.9* 39.1 37.4* 32.7*  PLT 200   < > 146*  --   --  162 180 174 150  MCV 101.8*   < > 98.7  --   --  98.2 97.0 96.4 96.7  MCH 33.0   < > 32.4  --   --  32.6 32.8 32.7 32.5  MCHC 32.4   < > 32.8  --   --  33.2 33.8 34.0 33.6  RDW 13.1   < > 12.9  --   --  13.1 13.0 13.2 13.1  LYMPHSABS 0.9  --   --   --   --   --   --   --  0.6*  MONOABS 1.1*  --   --   --   --   --   --   --  1.1*  EOSABS 0.1  --   --   --   --   --   --   --  0.0  BASOSABS 0.1  --   --   --   --   --   --    --  0.0   < > = values in this interval  not displayed.    Chemistries  Recent Labs  Lab 01/23/20 1319 01/23/20 1545 01/24/20 0333 01/24/20 0333 01/24/20 0459 01/25/20 0326 01/26/20 0310 01/27/20 0500 01/28/20 0713  NA 138   < > 141   < > 139 141 143 141 143  K 4.8   < > 3.5   < > 3.3* 3.6 3.7 3.5 3.5  CL 100   < > 103  --   --  105 105 103 103  CO2 21*   < > 22  --   --  _0 GLUCOSE 91   < > 133*  --   --  143* 112* 120* 119*  BUN 24*   < > 31*  --   --  48* 41* 36* 38*  CREATININE 1.76*   < > 1.69*  --   --  1.77* 1.18 1.07 1.27*  CALCIUM 8.4*   < > 7.8*  --   --  7.7* 8.0* 8.0* 7.9*  MG  --   --  1.9  --   --   --   --   --  2.1  AST 97*  --  146*  --   --  145* 135*  --  77*  ALT 79*  --  109*  --   --  147* 177*  --  107*  ALKPHOS 74  --  58  --   --  51 56  --  44  BILITOT 1.6*  --  1.0  --   --  0.7 1.0  --  0.9   < > = values in this interval not displayed.   ------------------------------------------------------------------------------------------------------------------ No results for input(s): CHOL, HDL, LDLCALC, TRIG, CHOLHDL, LDLDIRECT in the last 72 hours.  No results found for: HGBA1C ------------------------------------------------------------------------------------------------------------------ No results for input(s): TSH, T4TOTAL, T3FREE, THYROIDAB in the last 72 hours.  Invalid input(s): FREET3 ------------------------------------------------------------------------------------------------------------------ No results for input(s): VITAMINB12, FOLATE, FERRITIN, TIBC, IRON, RETICCTPCT in the last 72 hours.  Coagulation profile Recent Labs  Lab 01/23/20 1319  INR 1.2    Recent Labs    01/27/20 0802 01/28/20 0713  DDIMER 5.89* 7.43*    Cardiac Enzymes No results for input(s): CKMB, TROPONINI, MYOGLOBIN in the last 168 hours.  Invalid input(s):  CK ------------------------------------------------------------------------------------------------------------------    Component Value Date/Time   BNP 349.6 (H) 01/28/2020 8546    Micro Results Recent Results (from the past 240 hour(s))  Respiratory Panel by RT PCR (Flu A&B, Covid) - Nasopharyngeal Swab     Status: Abnormal   Collection Time: 01/23/20  1:06 PM   Specimen: Nasopharyngeal Swab  Result Value Ref Range Status   SARS Coronavirus 2 by RT PCR POSITIVE (A) NEGATIVE Final    Comment: RESULT CALLED TO, READ BACK BY AND VERIFIED WITH: P. PULLIAM, RN AT 2703 ON 01/23/20 BY C. JESSUP, MT. (NOTE) SARS-CoV-2 target nucleic acids are DETECTED. SARS-CoV-2 RNA is generally detectable in upper respiratory specimens  during the acute phase of infection. Positive results are indicative of the presence of the identified virus, but do not rule out bacterial infection or co-infection with other pathogens not detected by the test. Clinical correlation with patient history and other diagnostic information is necessary to determine patient infection status. The expected result is Negative. Fact Sheet for Patients:  PinkCheek.be Fact Sheet for Healthcare Providers: GravelBags.it This test is not yet approved or cleared by the [place] FDA and  has been authorized for detection and/or diagnosis of SARS-CoV-2 by FDA  under an Emergency Use Authorization (EUA).  This EUA will remain in effect (meaning this tes t can be used) for the duration of  the COVID-19 declaration under Section 564(b)(1) of the Act, 21 U.S.C. section 360bbb-3(b)(1), unless the authorization is terminated or revoked sooner.    Influenza A by PCR NEGATIVE NEGATIVE Final   Influenza B by PCR NEGATIVE NEGATIVE Final    Comment: (NOTE) The Xpert Xpress SARS-CoV-2/FLU/RSV assay is intended as an aid in  the diagnosis of influenza from Nasopharyngeal swab  specimens and  should not be used as a sole basis for treatment. Nasal washings and  aspirates are unacceptable for Xpert Xpress SARS-CoV-2/FLU/RSV  testing. Fact Sheet for Patients: PinkCheek.be Fact Sheet for Healthcare Providers: GravelBags.it This test is not yet approved or cleared by the [place] FDA and  has been authorized for detection and/or diagnosis of SARS-CoV-2 by  FDA under an Emergency Use Authorization (EUA). This EUA will remain  in effect (meaning this test can be used) for the duration of the  Covid-19 declaration under Section 564(b)(1) of the Act, 21  U.S.C. section 360bbb-3(b)(1), unless the authorization is  terminated or revoked. Performed at [facility], [place] [address], [place], [place] [address]   Culture, blood (routine x 2)     Status: None   Collection Time: 01/23/20  1:21 PM   Specimen: BLOOD RIGHT ARM  Result Value Ref Range Status   Specimen Description BLOOD RIGHT ARM  Final   Special Requests   Final    BOTTLES DRAWN AEROBIC ONLY Blood Culture results may not be optimal due to an inadequate volume of blood received in culture bottles   Culture   Final    NO GROWTH 5 DAYS Performed at [facility], [place] [address] [place], [place] [address]    Report Status 01/28/2020 FINAL  Final  Culture, blood (routine x 2)     Status: None   Collection Time: 01/23/20  1:47 PM   Specimen: BLOOD RIGHT FOREARM  Result Value Ref Range Status   Specimen Description BLOOD RIGHT FOREARM  Final   Special Requests   Final    BOTTLES DRAWN AEROBIC AND ANAEROBIC Blood Culture results may not be optimal due to an inadequate volume of blood received in culture bottles   Culture   Final    NO GROWTH 5 DAYS Performed at [facility], [place] [address], [place], [place] [address]    Report Status 01/28/2020 FINAL  Final    Radiology Reports DG Abd 1 View  Result Date: 01/23/2020 CLINICAL  DATA:  Check feeding catheter placement EXAM: ABDOMEN - 1 VIEW COMPARISON:  None. FINDINGS: Gastric catheter is noted with the tip in the distal stomach. Scattered large and small bowel gas is noted. IMPRESSION: Gastric catheter within the distal stomach. Electronically Signed   By: Inez Catalina M.D.   On: 01/23/2020 16:38   CT Head Wo Contrast  Result Date: 01/23/2020 CLINICAL DATA:  Encephalopathy, unresponsive EXAM: CT HEAD WITHOUT CONTRAST TECHNIQUE: Contiguous axial images were obtained from the base of the skull through the vertex without intravenous contrast. COMPARISON:  07/30/2010 FINDINGS: Brain: Confluent hypodensities throughout the periventricular white matter are compatible with age-indeterminate small vessel ischemic changes, favor chronic. No other signs of acute infarct or hemorrhage. Lateral ventricles and remaining midline structures are unremarkable. No acute extra-axial fluid collections. No mass effect. Vascular: No hyperdense vessel or unexpected calcification. Skull: Normal. Negative for fracture or focal lesion.  Sinuses/Orbits: Mucoperiosteal thickening is seen within the bilateral ethmoid air cells. Other: None IMPRESSION: 1. Likely chronic small-vessel ischemic changes throughout the white matter. 2. No acute intracranial process otherwise. Electronically Signed   By: Randa Ngo M.D.   On: 01/23/2020 15:02   DG Chest Portable 1 View  Result Date: 01/23/2020 CLINICAL DATA:  Respiratory failure EXAM: PORTABLE CHEST 1 VIEW COMPARISON:  June 24, 2019 FINDINGS: Endotracheal tube is identified distal tip 6.3 cm from carina. Increased pulmonary interstitium is identified in the bilateral upper lobes and right mid lung. There is no pleural effusion. The aorta is tortuous. The heart size is upper limits are normal. A feeding tube is identified with distal tip not included on film. IMPRESSION: Increased pulmonary interstitium in the bilateral upper lobes and right mid lung, findings are  suspicious for developing pneumonias. Electronically Signed   By: Abelardo Diesel M.D.   On: 01/23/2020 13:59   VAS [place] LOWER EXTREMITY VENOUS (DVT)  Result Date: 01/27/2020  Lower Venous DVTStudy Indications: Elevated Ddimer.  Risk Factors: COVID 19 positive. Comparison Study: No prior studies. Performing Technologist: Oliver Hum RVT  Examination Guidelines: A complete evaluation includes B-mode imaging, spectral Doppler, color Doppler, and power Doppler as needed of all accessible portions of each vessel. Bilateral testing is considered an integral part of a complete examination. Limited examinations for reoccurring indications may be performed as noted. The reflux portion of the exam is performed with the patient in reverse Trendelenburg.  +---------+---------------+---------+-----------+----------+--------------+  RIGHT     Compressibility Phasicity Spontaneity Properties Thrombus Aging  +---------+---------------+---------+-----------+----------+--------------+  CFV       Full            Yes       Yes                                    +---------+---------------+---------+-----------+----------+--------------+  SFJ       Full                                                             +---------+---------------+---------+-----------+----------+--------------+  FV Prox   Full                                                             +---------+---------------+---------+-----------+----------+--------------+  FV Mid    Full                                                             +---------+---------------+---------+-----------+----------+--------------+  FV Distal Full                                                             +---------+---------------+---------+-----------+----------+--------------+  PFV       Full                                                             +---------+---------------+---------+-----------+----------+--------------+  POP       Full            Yes       Yes                                     +---------+---------------+---------+-----------+----------+--------------+  PTV       Full                                                             +---------+---------------+---------+-----------+----------+--------------+  PERO      Full                                                             +---------+---------------+---------+-----------+----------+--------------+   +---------+---------------+---------+-----------+----------+--------------+  LEFT      Compressibility Phasicity Spontaneity Properties Thrombus Aging  +---------+---------------+---------+-----------+----------+--------------+  CFV       Full            Yes       Yes                                    +---------+---------------+---------+-----------+----------+--------------+  SFJ       Full                                                             +---------+---------------+---------+-----------+----------+--------------+  FV Prox   Full                                                             +---------+---------------+---------+-----------+----------+--------------+  FV Mid    Full                                                             +---------+---------------+---------+-----------+----------+--------------+  FV Distal Full                                                             +---------+---------------+---------+-----------+----------+--------------+  PFV       Full                                                             +---------+---------------+---------+-----------+----------+--------------+  POP       Full            Yes       Yes                                    +---------+---------------+---------+-----------+----------+--------------+  PTV       Full                                                             +---------+---------------+---------+-----------+----------+--------------+  PERO      Full                                                              +---------+---------------+---------+-----------+----------+--------------+     Summary: RIGHT: - There is no evidence of deep vein thrombosis in the lower extremity.  - No cystic structure found in the popliteal fossa.  LEFT: - There is no evidence of deep vein thrombosis in the lower extremity.  - No cystic structure found in the popliteal fossa.  *See table(s) above for measurements and observations.    Preliminary    ECHOCARDIOGRAM LIMITED  Result Date: 01/23/2020    ECHOCARDIOGRAM REPORT   Patient Name:   Jon Baker Date of Exam: 01/23/2020 Medical Rec #:  [id number]       Height:       71.0 in Accession #:    [id number]      Weight: Date of Birth:  12-04-1932       BSA: Patient Age:    84 years        BP:           99/74 mmHg Patient Gender: M               HR:           62 bpm. Exam Location:  Inpatient Procedure: Limited Echo, Cardiac Doppler and Limited Color Doppler Indications:    Cardiomegaly 429.3  History:        Patient has no prior history of Echocardiogram examinations.                 CHF, CAD; COPD and Covid.  Sonographer:    Johny Chess Referring Phys: [id number] [place]  1. Left ventricular ejection fraction, by estimation, is 35 to 40%. The left ventricle has moderately decreased function. The left ventricle demonstrates regional wall motion abnormalities (see scoring diagram/findings for description). The left ventricular internal cavity size was mildly dilated. Left ventricular diastolic parameters are consistent with Grade I diastolic dysfunction (impaired relaxation). There is moderate hypokinesis of the left ventricular, mid-apical  anteroseptal wall, anterior wall and apical segment.  2. Right ventricular systolic function is mildly reduced. The right ventricular size is mildly enlarged. There is mildly elevated pulmonary artery systolic pressure.  3. Left atrial size was mildly dilated.  4. The mitral valve is normal in structure and function. No evidence of  mitral valve regurgitation.  5. The aortic valve is tricuspid. Aortic valve regurgitation is not visualized. Mild to moderate aortic valve sclerosis/calcification is present, without any evidence of aortic stenosis.  6. The inferior vena cava is dilated in size with <50% respiratory variability, suggesting right atrial pressure of 15 mmHg. This is a nonspecific finding during positive pressure ventilation. FINDINGS  Left Ventricle: Left ventricular ejection fraction, by estimation, is 35 to 40%. The left ventricle has moderately decreased function. The left ventricle demonstrates regional wall motion abnormalities. Moderate hypokinesis of the left ventricular, mid-apical anteroseptal wall, anterior wall and apical segment. The left ventricular internal cavity size was mildly dilated. There is no left ventricular hypertrophy. Left ventricular diastolic parameters are consistent with Grade I diastolic dysfunction (impaired relaxation). Normal left ventricular filling pressure.  LV Wall Scoring: The mid and distal anterior wall, mid anteroseptal segment, and apex are hypokinetic. Right Ventricle: The right ventricular size is mildly enlarged. No increase in right ventricular wall thickness. Right ventricular systolic function is mildly reduced. There is mildly elevated pulmonary artery systolic pressure. The tricuspid regurgitant  velocity is 1.94 m/s, and with an assumed right atrial pressure of 15 mmHg, the estimated right ventricular systolic pressure is 20.8 mmHg. Left Atrium: Left atrial size was mildly dilated. Right Atrium: Right atrial size was not well visualized. Pericardium: There is no evidence of pericardial effusion. Mitral Valve: The mitral valve is normal in structure and function. No evidence of mitral valve regurgitation. Tricuspid Valve: The tricuspid valve is normal in structure. Tricuspid valve regurgitation is not demonstrated. Aortic Valve: The aortic valve is tricuspid. Aortic valve  regurgitation is not visualized. Mild to moderate aortic valve sclerosis/calcification is present, without any evidence of aortic stenosis. Pulmonic Valve: The pulmonic valve was not well visualized. Pulmonic valve regurgitation is not visualized. Aorta: The aortic root is normal in size and structure. Venous: The inferior vena cava is dilated in size with less than 50% respiratory variability, suggesting right atrial pressure of 15 mmHg. IAS/Shunts: No atrial level shunt detected by color flow Doppler.  LEFT VENTRICLE PLAX 2D LVIDd:         5.90 cm  Diastology LVIDs:         4.80 cm  LV e' lateral:   4.13 cm/s LV PW:         0.80 cm  LV E/e' lateral: 11.0 LV IVS:        1.10 cm  LV e' medial:    4.13 cm/s LVOT diam:     2.20 cm  LV E/e' medial:  11.0 LVOT Area:     3.80 cm  LEFT ATRIUM LA diam:    3.40 cm   AORTA Ao Root diam: 3.60 cm MITRAL VALVE               TRICUSPID VALVE MV Area (PHT): 3.60 cm    TR Peak grad:   15.1 mmHg MV Decel Time: 211 msec    TR Vmax:        194.00 cm/s MV E velocity: 45.40 cm/s MV A velocity: 87.40 cm/s  SHUNTS MV E/A ratio:  0.52        Systemic Diam: 2.20  cm Sanda Klein MD Electronically signed by Sanda Klein MD Signature Date/Time: 01/23/2020/5:37:49 PM    Final     Time Spent in minutes  30   Lala Lund M.D on 01/28/2020 at 9:13 AM  To page go to [email or web address] - password [facility]

## 2020-01-28 NOTE — Telephone Encounter (Signed)
Patient of Dr. Kavin Leech. Needs follow up after hospitalization. Follows annually for COPD on home oxygen. Was COVID positive on 2/14 so needs to be seen a month after that. Please schedule.

## 2020-01-28 NOTE — Progress Notes (Signed)
Dispensing optician handed to pt.

## 2020-01-28 NOTE — Progress Notes (Signed)
Physical Therapy Treatment Patient Details Name: Jon Baker MRN: [id number] DOB: [birth year] Today's Date: 01/28/2020    History of Present Illness 86 yo male smoker brought to ER unresponsive and had fever 102.7.  Intubated in ER and required pressors.  Found to have COVID 19 pneumonia.  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    PT Comments    Pt admitted with above diagnosis. Pt was able to ambulate in room with RW with min assist of 2 for stability. Pt needs cues for sequencing steps and RW.  Pt did perform much better than last visit.  Confusion limting pts progress.   Pt currently with functional limitations due to balance and endurance deficits. Pt will benefit from skilled PT to increase their independence and safety with mobility to allow discharge to the venue listed below.     Follow Up Recommendations  SNF;Supervision/Assistance - 24 hour     Equipment Recommendations  None recommended by PT    Recommendations for Other Services       Precautions / Restrictions Precautions Precautions: Fall Restrictions Weight Bearing Restrictions: No    Mobility  Bed Mobility               General bed mobility comments: Pt in recliner   Transfers Overall transfer level: Needs assistance Equipment used: Rolling walker (2 wheeled) Transfers: Sit to/from Stand Sit to Stand: Min assist;+2 safety/equipment         General transfer comment: Pt Min A for sit to stand from recliner with RW. cues for hand placement espeically upon return to chair.   Ambulation/Gait Ambulation/Gait assistance: Min assist;+2 safety/equipment Gait Distance (Feet): 50 Feet Assistive device: Rolling walker (2 wheeled) Gait Pattern/deviations: Step-through pattern;Decreased stride length;Drifts right/left;Wide base of support   Gait velocity interpretation: <1.31 ft/sec, indicative of  household ambulator General Gait Details: Pt was able to ambulate in room several laps.  Pt ambulated without the O2 and desat to 86% on RA therefore needing 4LO2 with ambulation to maintain sats >86%.  Other VSS for activtiy.    Stairs             Wheelchair Mobility    Modified Rankin (Stroke Patients Only)       Balance Overall balance assessment: Needs assistance Sitting-balance support: No upper extremity supported;Feet supported Sitting balance-Leahy Scale: Fair     Standing balance support: Bilateral upper extremity supported;During functional activity Standing balance-Leahy Scale: Poor Standing balance comment: relies on UE support for balance.                             Cognition Arousal/Alertness: Awake/alert Behavior During Therapy: Flat affect;Restless(Pt fidgity with lines) Overall Cognitive Status: Impaired/Different from baseline Area of Impairment: Attention;Memory;Following commands;Safety/judgement;Awareness                 Orientation Level: Situation;Place;Time Current Attention Level: Sustained Memory: Decreased short-term memory;Decreased recall of precautions Following Commands: Follows one step commands inconsistently;Follows one step commands with increased time Safety/Judgement: Decreased awareness of safety;Decreased awareness of deficits Awareness: Emergent Problem Solving: Slow processing;Decreased initiation;Difficulty sequencing;Requires verbal cues;Requires tactile cues General Comments: Pt slow to respond to commands, poor motor planning.        Exercises General Exercises - Lower Extremity Ankle Circles/Pumps: AROM;Both;5 reps;Supine Long Arc Quad: AROM;Both;5 reps;Seated Other Exercises Other Exercises: Pursed lip breathing  General Comments        Pertinent Vitals/Pain Pain Assessment: No/denies pain    Home Living                      Prior Function            PT Goals (current goals  can now be found in the care plan section) Acute Rehab PT Goals Patient Stated Goal: to go home Progress towards PT goals: Progressing toward goals    Frequency    Min 3X/week      PT Plan Current plan remains appropriate    Co-evaluation              AM-PAC PT "6 Clicks" Mobility   Outcome Measure  Help needed turning from your back to your side while in a flat bed without using bedrails?: A Lot Help needed moving from lying on your back to sitting on the side of a flat bed without using bedrails?: A Lot Help needed moving to and from a bed to a chair (including a wheelchair)?: A Lot Help needed standing up from a chair using your arms (e.g., wheelchair or bedside chair)?: A Lot Help needed to walk in hospital room?: A Little Help needed climbing 3-5 steps with a railing? : A Lot 6 Click Score: 13    End of Session Equipment Utilized During Treatment: Gait belt;Oxygen Activity Tolerance: Patient limited by fatigue Patient left: in chair;with call bell/phone within reach;with chair alarm set Nurse Communication: Mobility status PT Visit Diagnosis: Unsteadiness on feet (R26.81);Muscle weakness (generalized) (M62.81);Other abnormalities of gait and mobility (R26.89)     Time: 2025-4270 PT Time Calculation (min) (ACUTE ONLY): 16 min  Charges:  $Gait Training: 8-22 mins                     Allex Madia W,PT [place] Pager:  [phone]  Office:  [place] 01/28/2020, 2:55 PM

## 2020-01-29 DIAGNOSIS — J432 Centrilobular emphysema: Secondary | ICD-10-CM

## 2020-01-29 LAB — CBC WITH DIFFERENTIAL/PLATELET
Abs Immature Granulocytes: 0.11 10*3/uL — ABNORMAL HIGH (ref 0.00–0.07)
Basophils Absolute: 0 10*3/uL (ref 0.0–0.1)
Basophils Relative: 0 %
Eosinophils Absolute: 0 10*3/uL (ref 0.0–0.5)
Eosinophils Relative: 0 %
HCT: 33 % — ABNORMAL LOW (ref 39.0–52.0)
Hemoglobin: 11.3 g/dL — ABNORMAL LOW (ref 13.0–17.0)
Immature Granulocytes: 1 %
Lymphocytes Relative: 3 %
Lymphs Abs: 0.4 10*3/uL — ABNORMAL LOW (ref 0.7–4.0)
MCH: 32.5 pg (ref 26.0–34.0)
MCHC: 34.2 g/dL (ref 30.0–36.0)
MCV: 94.8 fL (ref 80.0–100.0)
Monocytes Absolute: 0.6 10*3/uL (ref 0.1–1.0)
Monocytes Relative: 4 %
Neutro Abs: 14.4 10*3/uL — ABNORMAL HIGH (ref 1.7–7.7)
Neutrophils Relative %: 92 %
Platelets: 158 10*3/uL (ref 150–400)
RBC: 3.48 MIL/uL — ABNORMAL LOW (ref 4.22–5.81)
RDW: 12.9 % (ref 11.5–15.5)
WBC: 15.6 10*3/uL — ABNORMAL HIGH (ref 4.0–10.5)
nRBC: 0 % (ref 0.0–0.2)

## 2020-01-29 LAB — D-DIMER, QUANTITATIVE: D-Dimer, Quant: 8.14 ug/mL-FEU — ABNORMAL HIGH (ref 0.00–0.50)

## 2020-01-29 LAB — COMPREHENSIVE METABOLIC PANEL
ALT: 101 U/L — ABNORMAL HIGH (ref 0–44)
AST: 66 U/L — ABNORMAL HIGH (ref 15–41)
Albumin: 2.8 g/dL — ABNORMAL LOW (ref 3.5–5.0)
Alkaline Phosphatase: 48 U/L (ref 38–126)
Anion gap: 10 (ref 5–15)
BUN: 40 mg/dL — ABNORMAL HIGH (ref 8–23)
CO2: 30 mmol/L (ref 22–32)
Calcium: 7.7 mg/dL — ABNORMAL LOW (ref 8.9–10.3)
Chloride: 100 mmol/L (ref 98–111)
Creatinine, Ser: 1.44 mg/dL — ABNORMAL HIGH (ref 0.61–1.24)
GFR calc Af Amer: 51 mL/min — ABNORMAL LOW (ref >60)
GFR calc non Af Amer: 44 mL/min — ABNORMAL LOW (ref >60)
Glucose, Bld: 143 mg/dL — ABNORMAL HIGH (ref 70–99)
Potassium: 3.6 mmol/L (ref 3.5–5.1)
Sodium: 140 mmol/L (ref 135–145)
Total Bilirubin: 1 mg/dL (ref 0.3–1.2)
Total Protein: 5 g/dL — ABNORMAL LOW (ref 6.5–8.1)

## 2020-01-29 LAB — BRAIN NATRIURETIC PEPTIDE: B Natriuretic Peptide: 301.5 pg/mL — ABNORMAL HIGH (ref 0.0–100.0)

## 2020-01-29 LAB — PROCALCITONIN: Procalcitonin: 0.34 ng/mL

## 2020-01-29 LAB — MAGNESIUM: Magnesium: 2.2 mg/dL (ref 1.7–2.4)

## 2020-01-29 LAB — C-REACTIVE PROTEIN: CRP: 0.8 mg/dL (ref <1.0)

## 2020-01-29 MED ORDER — METHYLPREDNISOLONE SODIUM SUCC 40 MG IJ SOLR
40.0000 mg | Freq: Two times a day (BID) | INTRAMUSCULAR | Status: DC
  Administered 2020-01-29 – 2020-01-31 (×4): 40 mg via INTRAVENOUS
  Filled 2020-01-29 (×4): qty 1

## 2020-01-29 MED ORDER — FUROSEMIDE 40 MG PO TABS
40.0000 mg | ORAL_TABLET | Freq: Every day | ORAL | Status: DC
  Administered 2020-01-29 – 2020-01-30 (×2): 40 mg via ORAL
  Filled 2020-01-29 (×2): qty 1

## 2020-01-29 MED ORDER — POTASSIUM CHLORIDE 20 MEQ PO PACK
40.0000 meq | PACK | Freq: Every day | ORAL | Status: DC
  Administered 2020-01-29 – 2020-01-30 (×2): 40 meq via ORAL
  Filled 2020-01-29 (×2): qty 2

## 2020-01-29 NOTE — Progress Notes (Signed)
PROGRESS NOTE                                                                                                                                                                                                             Patient Demographics:    Jon Baker, is a 86 y.o. male, DOB - 1932-08-27, NAT:[id number]  Admit date - 01/23/2020   Admitting Physician Adewale Clearence Ped, MD  Outpatient Primary MD for the patient is [name], [name], MD  LOS - 6  CC - SOB     Brief Narrative  -  86 yo male smoker brought to ER unresponsive and had fever 102.7.  Intubated in ER and required pressors.  Found to have COVID 19 pneumonia.  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  Significant Hospital Events   2/14 SARS Cov2 >> positive 2/14 Flu A/B >> both negative Blood 2/14 >>  2/14 Admit 2/16 extubated 2/17 transfer out of ICU    Subjective:   Patient in bed having breakfast denies any headache, no chest abdominal pain, shortness of breath and wheezing are much improved.   Assessment  & Plan :     1.  Acute on chronic hypoxic respiratory failure in a patient with advanced COPD who uses 3 L of nasal cannula oxygen at home along with ILD. This was due to combination of COVID-19 pneumonia and now looks to have some element of acute on chronic systolic CHF with EF 42% superimposed on his advanced COPD.  He needed to be intubated, was extubated on the 17th of this month, was under the care of ICU and transferred out of ICU on 01/27/2020 under my care.  He is finishing his remdesivir course for COVID-19 pneumonia, continues to have wheezing hence I will continue IV steroids, of note he takes 10 mg of prednisone chronically at home, will continue IV Lasix for fluid overload PRN and acute on chronic systolic CHF.    He is much improved with IV steroids and diuretics, wheezing and  crackles have improved, symptoms have improved, currently on 4 to 5 L nasal cannula oxygen which is much higher than his home dose of 3-4 L, advance activity, add flutter valve and I-S for pulmonary toiletry, try to taper down oxygen as tolerated.  Monitor closely.  2.  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  On 3 L  nasal cannula oxygen at home, oxygen supplementation as needed, continue supportive care, see above.  3.  Acute on chronic systolic CHF.  EF 35 to 40%.  Also had demand mismatch related non-ACS pattern troponin rise.  Placed on IV Lasix as tolerated by his blood pressure and renal function, aspirin, Coreg and Lipitor.  Chest pain-free.  Echo noted.  Seen by cardiology this admission.  4.  Acute metabolic encephalopathy secondary to hypoxia.  Improved.  5.  Hematuria.  Has Foley, this started in ICU, monitor.  Currently not on any chemical DVT prophylaxis.  6.  Elevated D-dimer.,  See #5 above, SCDs for now, check lower extremity venous duplex.  7.  CKD 3.  Follow be made.  8.  Deconditioning.  PT OT advance activity.  9. HTN - was on Coreg, BiDil has been added, blood pressure still high will add Norvasc as well and monitor.  10.  AKI . Baseline creatinine around 1 per daughter who pulled his old records, for now needs diuresis will monitor cautiously.     Family Communication  :   Daughter Ebony Hail cell phone 01/27/2020, 01/28/2020, 01/29/20  Code Status :  DNR  Disposition Plan  : Stay in the hospital for hypoxic respiratory failure, COVID-19 treatment, treatment of acute on chronic systolic CHF exacerbation and COPD exacerbation with IV Lasix and IV steroids.  Consults  : PCCM, cardiology  Procedures  :  CT Head  2/14 >> chronic small vessel changes, nothing acute  Echocardiogram 2/14 >> LVEF 35-40% with regional wall motion abnormalities, grade 1 diastolic dysfunction, mildly reduced RV function and mildly elevated PASP   DVT Prophylaxis   : SCDs, not on chemical prophylaxis due to hematuria  Lab Results  Component Value Date   PLT 158 01/29/2020    Diet :  Diet Order            Diet Heart Room service appropriate? Yes; Fluid consistency: Thin  Diet effective now               Inpatient Medications Scheduled Meds: . albuterol  4 puff Inhalation Q4H  . amLODipine  10 mg Oral Daily  . aspirin  325 mg Oral Daily  . atorvastatin  80 mg Oral QHS  . carvedilol  12.5 mg Oral BID WC  . chlorhexidine gluconate (MEDLINE KIT)  15 mL Mouth Rinse BID  . Chlorhexidine Gluconate Cloth  6 each Topical Daily  . isosorbide-hydrALAZINE  1 tablet Oral TID  . mouth rinse  15 mL Mouth Rinse BID  . methylPREDNISolone (SOLU-MEDROL) injection  60 mg Intravenous Q12H  . mometasone-formoterol  2 puff Inhalation BID  . umeclidinium bromide  1 puff Inhalation Daily   Continuous Infusions: . cefTRIAXone (ROCEPHIN)  IV 2 g (01/28/20 1343)   PRN Meds:.guaiFENesin-dextromethorphan, hydrALAZINE  Antibiotics  :   Anti-infectives (From admission, onward)   Start     Dose/Rate Route Frequency Ordered Stop   01/24/20 1400  azithromycin (ZITHROMAX) 500 mg in sodium chloride 0.9 % 250 mL IVPB  Status:  Discontinued     500 mg 250 mL/hr over 60 Minutes Intravenous Daily 01/23/20 1620 01/27/20 1605   01/24/20 1400  cefTRIAXone (ROCEPHIN) 2 g in sodium chloride 0.9 % 100 mL IVPB     2 g 200 mL/hr over 30 Minutes Intravenous Daily 01/23/20 1620 01/30/20 1359   01/24/20 1000  remdesivir 100 mg in sodium chloride 0.9 % 100 mL IVPB     100 mg 200 mL/hr over 30  Minutes Intravenous Daily 01/23/20 1853 01/27/20 0918   01/23/20 1900  remdesivir 200 mg in sodium chloride 0.9% 250 mL IVPB     200 mg 580 mL/hr over 30 Minutes Intravenous Once 01/23/20 1853 01/23/20 1952   01/23/20 1330  cefTRIAXone (ROCEPHIN) 2 g in sodium chloride 0.9 % 100 mL IVPB     2 g 200 mL/hr over 30 Minutes Intravenous  Once 01/23/20 1323 01/23/20 1421   01/23/20 1330   azithromycin (ZITHROMAX) 500 mg in sodium chloride 0.9 % 250 mL IVPB     500 mg 250 mL/hr over 60 Minutes Intravenous  Once 01/23/20 1324 01/23/20 1452          Objective:   Vitals:   01/29/20 0037 01/29/20 0400 01/29/20 0520 01/29/20 0800  BP: 125/71 135/79 (!) 150/106 (!) 165/89  Pulse: 62 (!) 56 67 (!) 52  Resp: 18 18 (!) 21 16  Temp: 97.7 F (36.5 C)  98.3 F (36.8 C) 98.6 F (37 C)  TempSrc: Oral  Axillary Oral  SpO2: 96% 95% 92% 92%  Weight:   103.8 kg   Height:        SpO2: 92 % O2 Flow Rate (L/min): 5 L/min FiO2 (%): 40 %  Wt Readings from Last 3 Encounters:  01/29/20 103.8 kg     Intake/Output Summary (Last 24 hours) at 01/29/2020 0850 Last data filed at 01/29/2020 0520 Gross per 24 hour  Intake 450 ml  Output 1800 ml  Net -1350 ml     Physical Exam  Awake Alert, No new F.N deficits, Normal affect [place].AT,PERRAL Supple Neck,No JVD, No cervical lymphadenopathy appriciated.  Symmetrical Chest wall movement, Good air movement bilaterally, much improved wheezing and crackles, does have some upper airway wheezing on expiration but this is not stridor RRR,No Gallops, Rubs or new Murmurs, No Parasternal Heave +ve B.Sounds, Abd Soft, No tenderness, No organomegaly appriciated, No rebound - guarding or rigidity. No Cyanosis, Clubbing or edema, No new Rash or bruise    Data Review:    CBC Recent Labs  Lab 01/23/20 1319 01/23/20 1545 01/25/20 0326 01/26/20 0310 01/27/20 0500 01/28/20 0713 01/29/20 0542  WBC 12.0*   < > 24.2* 26.0* 20.6* 16.7* 15.6*  HGB 14.7   < > 12.6* 13.2 12.7* 11.0* 11.3*  HCT 45.4   < > 37.9* 39.1 37.4* 32.7* 33.0*  PLT 200   < > 162 180 174 150 158  MCV 101.8*   < > 98.2 97.0 96.4 96.7 94.8  MCH 33.0   < > 32.6 32.8 32.7 32.5 32.5  MCHC 32.4   < > 33.2 33.8 34.0 33.6 34.2  RDW 13.1   < > 13.1 13.0 13.2 13.1 12.9  LYMPHSABS 0.9  --   --   --   --  0.6* 0.4*  MONOABS 1.1*  --   --   --   --  1.1* 0.6  EOSABS 0.1  --   --   --    --  0.0 0.0  BASOSABS 0.1  --   --   --   --  0.0 0.0   < > = values in this interval not displayed.    Chemistries  Recent Labs  Lab 01/24/20 0333 01/24/20 0459 01/25/20 0326 01/26/20 0310 01/27/20 0500 01/28/20 0713 01/29/20 0542  NA 141   < > 141 143 141 143 140  K 3.5   < > 3.6 3.7 3.5 3.5 3.6  CL 103   < > 105 105  103 103 100  CO2 22   < > '25 25 27 31 30  '$ GLUCOSE 133*   < > 143* 112* 120* 119* 143*  BUN 31*   < > 48* 41* 36* 38* 40*  CREATININE 1.69*   < > 1.77* 1.18 1.07 1.27* 1.44*  CALCIUM 7.8*   < > 7.7* 8.0* 8.0* 7.9* 7.7*  MG 1.9  --   --   --   --  2.1 2.2  AST 146*  --  145* 135*  --  77* 66*  ALT 109*  --  147* 177*  --  107* 101*  ALKPHOS 58  --  51 56  --  44 48  BILITOT 1.0  --  0.7 1.0  --  0.9 1.0   < > = values in this interval not displayed.   ------------------------------------------------------------------------------------------------------------------ No results for input(s): CHOL, HDL, LDLCALC, TRIG, CHOLHDL, LDLDIRECT in the last 72 hours.  No results found for: HGBA1C ------------------------------------------------------------------------------------------------------------------ No results for input(s): TSH, T4TOTAL, T3FREE, THYROIDAB in the last 72 hours.  Invalid input(s): FREET3 ------------------------------------------------------------------------------------------------------------------ No results for input(s): VITAMINB12, FOLATE, FERRITIN, TIBC, IRON, RETICCTPCT in the last 72 hours.  Coagulation profile Recent Labs  Lab 01/23/20 1319  INR 1.2    Recent Labs    01/28/20 0713 01/29/20 0542  DDIMER 7.43* 8.14*    Cardiac Enzymes No results for input(s): CKMB, TROPONINI, MYOGLOBIN in the last 168 hours.  Invalid input(s): CK ------------------------------------------------------------------------------------------------------------------    Component Value Date/Time   BNP 301.5 (H) 01/29/2020 0542    Micro  Results Recent Results (from the past 240 hour(s))  Respiratory Panel by RT PCR (Flu A&B, Covid) - Nasopharyngeal Swab     Status: Abnormal   Collection Time: 01/23/20  1:06 PM   Specimen: Nasopharyngeal Swab  Result Value Ref Range Status   SARS Coronavirus 2 by RT PCR POSITIVE (A) NEGATIVE Final    Comment: RESULT CALLED TO, READ BACK BY AND VERIFIED WITH: P. PULLIAM, RN AT 9371 ON 01/23/20 BY C. JESSUP, MT. (NOTE) SARS-CoV-2 target nucleic acids are DETECTED. SARS-CoV-2 RNA is generally detectable in upper respiratory specimens  during the acute phase of infection. Positive results are indicative of the presence of the identified virus, but do not rule out bacterial infection or co-infection with other pathogens not detected by the test. Clinical correlation with patient history and other diagnostic information is necessary to determine patient infection status. The expected result is Negative. Fact Sheet for Patients:  PinkCheek.be Fact Sheet for Healthcare Providers: GravelBags.it This test is not yet approved or cleared by the [place] FDA and  has been authorized for detection and/or diagnosis of SARS-CoV-2 by FDA under an Emergency Use Authorization (EUA).  This EUA will remain in effect (meaning this tes t can be used) for the duration of  the COVID-19 declaration under Section 564(b)(1) of the Act, 21 U.S.C. section 360bbb-3(b)(1), unless the authorization is terminated or revoked sooner.    Influenza A by PCR NEGATIVE NEGATIVE Final   Influenza B by PCR NEGATIVE NEGATIVE Final    Comment: (NOTE) The Xpert Xpress SARS-CoV-2/FLU/RSV assay is intended as an aid in  the diagnosis of influenza from Nasopharyngeal swab specimens and  should not be used as a sole basis for treatment. Nasal washings and  aspirates are unacceptable for Xpert Xpress SARS-CoV-2/FLU/RSV  testing. Fact Sheet for  Patients: PinkCheek.be Fact Sheet for Healthcare Providers: GravelBags.it This test is not yet approved or cleared by the [place] FDA  and  has been authorized for detection and/or diagnosis of SARS-CoV-2 by  FDA under an Emergency Use Authorization (EUA). This EUA will remain  in effect (meaning this test can be used) for the duration of the  Covid-19 declaration under Section 564(b)(1) of the Act, 21  U.S.C. section 360bbb-3(b)(1), unless the authorization is  terminated or revoked. Performed at [facility], [place] [address] [place], [place] [address]   Culture, blood (routine x 2)     Status: None   Collection Time: 01/23/20  1:21 PM   Specimen: BLOOD RIGHT ARM  Result Value Ref Range Status   Specimen Description BLOOD RIGHT ARM  Final   Special Requests   Final    BOTTLES DRAWN AEROBIC ONLY Blood Culture results may not be optimal due to an inadequate volume of blood received in culture bottles   Culture   Final    NO GROWTH 5 DAYS Performed at [facility], [place] [address] [place], [place] [address]    Report Status 01/28/2020 FINAL  Final  Culture, blood (routine x 2)     Status: None   Collection Time: 01/23/20  1:47 PM   Specimen: BLOOD RIGHT FOREARM  Result Value Ref Range Status   Specimen Description BLOOD RIGHT FOREARM  Final   Special Requests   Final    BOTTLES DRAWN AEROBIC AND ANAEROBIC Blood Culture results may not be optimal due to an inadequate volume of blood received in culture bottles   Culture   Final    NO GROWTH 5 DAYS Performed at [facility], [place] [address], [place], [place] [address]    Report Status 01/28/2020 FINAL  Final    Radiology Reports DG Abd 1 View  Result Date: 01/23/2020 CLINICAL DATA:  Check feeding catheter placement EXAM: ABDOMEN - 1 VIEW COMPARISON:  None. FINDINGS: Gastric catheter is noted with the tip in the distal stomach. Scattered large and  small bowel gas is noted. IMPRESSION: Gastric catheter within the distal stomach. Electronically Signed   By: Inez Catalina M.D.   On: 01/23/2020 16:38   CT Head Wo Contrast  Result Date: 01/23/2020 CLINICAL DATA:  Encephalopathy, unresponsive EXAM: CT HEAD WITHOUT CONTRAST TECHNIQUE: Contiguous axial images were obtained from the base of the skull through the vertex without intravenous contrast. COMPARISON:  07/30/2010 FINDINGS: Brain: Confluent hypodensities throughout the periventricular white matter are compatible with age-indeterminate small vessel ischemic changes, favor chronic. No other signs of acute infarct or hemorrhage. Lateral ventricles and remaining midline structures are unremarkable. No acute extra-axial fluid collections. No mass effect. Vascular: No hyperdense vessel or unexpected calcification. Skull: Normal. Negative for fracture or focal lesion. Sinuses/Orbits: Mucoperiosteal thickening is seen within the bilateral ethmoid air cells. Other: None IMPRESSION: 1. Likely chronic small-vessel ischemic changes throughout the white matter. 2. No acute intracranial process otherwise. Electronically Signed   By: Randa Ngo M.D.   On: 01/23/2020 15:02   DG Chest Port 1 View  Result Date: 01/28/2020 CLINICAL DATA:  Shortness of breath. COPD. Additional history provided by technologist: Wheezing, COVID positive. EXAM: PORTABLE CHEST 1 VIEW COMPARISON:  Chest radiograph 01/23/2020 FINDINGS: Interval extubation.  Overlying cardiac monitoring leads. Unchanged cardiomegaly.  Aortic atherosclerosis. There has been significant interval improvement of apical predominant bilateral pulmonary opacities. Minimal linear atelectasis at the right lung base no evidence of pleural effusion or pneumothorax. No acute bony abnormality. IMPRESSION: Interval extubation. Significant interval improvement of apical predominant bilateral pulmonary opacities. Findings likely reflect sequela  of pneumonia given provided  history. Minimal linear atelectasis at the right lung base. Electronically Signed   By: Kellie Simmering DO   On: 01/28/2020 09:25   DG Chest Portable 1 View  Result Date: 01/23/2020 CLINICAL DATA:  Respiratory failure EXAM: PORTABLE CHEST 1 VIEW COMPARISON:  June 24, 2019 FINDINGS: Endotracheal tube is identified distal tip 6.3 cm from carina. Increased pulmonary interstitium is identified in the bilateral upper lobes and right mid lung. There is no pleural effusion. The aorta is tortuous. The heart size is upper limits are normal. A feeding tube is identified with distal tip not included on film. IMPRESSION: Increased pulmonary interstitium in the bilateral upper lobes and right mid lung, findings are suspicious for developing pneumonias. Electronically Signed   By: Abelardo Diesel M.D.   On: 01/23/2020 13:59   VAS [place] LOWER EXTREMITY VENOUS (DVT)  Result Date: 01/28/2020  Lower Venous DVTStudy Indications: Elevated Ddimer.  Risk Factors: COVID 19 positive. Comparison Study: No prior studies. Performing Technologist: Oliver Hum RVT  Examination Guidelines: A complete evaluation includes B-mode imaging, spectral Doppler, color Doppler, and power Doppler as needed of all accessible portions of each vessel. Bilateral testing is considered an integral part of a complete examination. Limited examinations for reoccurring indications may be performed as noted. The reflux portion of the exam is performed with the patient in reverse Trendelenburg.  +---------+---------------+---------+-----------+----------+--------------+ RIGHT    CompressibilityPhasicitySpontaneityPropertiesThrombus Aging +---------+---------------+---------+-----------+----------+--------------+ CFV      Full           Yes      Yes                                 +---------+---------------+---------+-----------+----------+--------------+ SFJ      Full                                                         +---------+---------------+---------+-----------+----------+--------------+ FV Prox  Full                                                        +---------+---------------+---------+-----------+----------+--------------+ FV Mid   Full                                                        +---------+---------------+---------+-----------+----------+--------------+ FV DistalFull                                                        +---------+---------------+---------+-----------+----------+--------------+ PFV      Full                                                        +---------+---------------+---------+-----------+----------+--------------+  POP      Full           Yes      Yes                                 +---------+---------------+---------+-----------+----------+--------------+ PTV      Full                                                        +---------+---------------+---------+-----------+----------+--------------+ PERO     Full                                                        +---------+---------------+---------+-----------+----------+--------------+   +---------+---------------+---------+-----------+----------+--------------+ LEFT     CompressibilityPhasicitySpontaneityPropertiesThrombus Aging +---------+---------------+---------+-----------+----------+--------------+ CFV      Full           Yes      Yes                                 +---------+---------------+---------+-----------+----------+--------------+ SFJ      Full                                                        +---------+---------------+---------+-----------+----------+--------------+ FV Prox  Full                                                        +---------+---------------+---------+-----------+----------+--------------+ FV Mid   Full                                                         +---------+---------------+---------+-----------+----------+--------------+ FV DistalFull                                                        +---------+---------------+---------+-----------+----------+--------------+ PFV      Full                                                        +---------+---------------+---------+-----------+----------+--------------+ POP      Full           Yes      Yes                                 +---------+---------------+---------+-----------+----------+--------------+  PTV      Full                                                        +---------+---------------+---------+-----------+----------+--------------+ PERO     Full                                                        +---------+---------------+---------+-----------+----------+--------------+     Summary: RIGHT: - There is no evidence of deep vein thrombosis in the lower extremity.  - No cystic structure found in the popliteal fossa.  LEFT: - There is no evidence of deep vein thrombosis in the lower extremity.  - No cystic structure found in the popliteal fossa.  *See table(s) above for measurements and observations. Electronically signed by Deitra Mayo MD on 01/28/2020 at 10:15:57 AM.    Final    ECHOCARDIOGRAM LIMITED  Result Date: 01/23/2020    ECHOCARDIOGRAM REPORT   Patient Name:   Jon Baker Date of Exam: 01/23/2020 Medical Rec #:  [id number]       Height:       71.0 in Accession #:    [id number]      Weight: Date of Birth:  1932/11/12       BSA: Patient Age:    41 years        BP:           99/74 mmHg Patient Gender: M               HR:           62 bpm. Exam Location:  Inpatient Procedure: Limited Echo, Cardiac Doppler and Limited Color Doppler Indications:    Cardiomegaly 429.3  History:        Patient has no prior history of Echocardiogram examinations.                 CHF, CAD; COPD and Covid.  Sonographer:    Johny Chess Referring Phys: [id number] [place]  1. Left ventricular ejection fraction, by estimation, is 35 to 40%. The left ventricle has moderately decreased function. The left ventricle demonstrates regional wall motion abnormalities (see scoring diagram/findings for description). The left ventricular internal cavity size was mildly dilated. Left ventricular diastolic parameters are consistent with Grade I diastolic dysfunction (impaired relaxation). There is moderate hypokinesis of the left ventricular, mid-apical anteroseptal wall, anterior wall and apical segment.  2. Right ventricular systolic function is mildly reduced. The right ventricular size is mildly enlarged. There is mildly elevated pulmonary artery systolic pressure.  3. Left atrial size was mildly dilated.  4. The mitral valve is normal in structure and function. No evidence of mitral valve regurgitation.  5. The aortic valve is tricuspid. Aortic valve regurgitation is not visualized. Mild to moderate aortic valve sclerosis/calcification is present, without any evidence of aortic stenosis.  6. The inferior vena cava is dilated in size with <50% respiratory variability, suggesting right atrial pressure of 15 mmHg. This is a nonspecific finding during positive pressure ventilation. FINDINGS  Left Ventricle: Left ventricular ejection fraction, by estimation, is 35 to 40%. The left ventricle has moderately decreased function. The left  ventricle demonstrates regional wall motion abnormalities. Moderate hypokinesis of the left ventricular, mid-apical anteroseptal wall, anterior wall and apical segment. The left ventricular internal cavity size was mildly dilated. There is no left ventricular hypertrophy. Left ventricular diastolic parameters are consistent with Grade I diastolic dysfunction (impaired relaxation). Normal left ventricular filling pressure.  LV Wall Scoring: The mid and distal anterior wall, mid anteroseptal segment, and apex are hypokinetic. Right Ventricle: The  right ventricular size is mildly enlarged. No increase in right ventricular wall thickness. Right ventricular systolic function is mildly reduced. There is mildly elevated pulmonary artery systolic pressure. The tricuspid regurgitant  velocity is 1.94 m/s, and with an assumed right atrial pressure of 15 mmHg, the estimated right ventricular systolic pressure is 38.2 mmHg. Left Atrium: Left atrial size was mildly dilated. Right Atrium: Right atrial size was not well visualized. Pericardium: There is no evidence of pericardial effusion. Mitral Valve: The mitral valve is normal in structure and function. No evidence of mitral valve regurgitation. Tricuspid Valve: The tricuspid valve is normal in structure. Tricuspid valve regurgitation is not demonstrated. Aortic Valve: The aortic valve is tricuspid. Aortic valve regurgitation is not visualized. Mild to moderate aortic valve sclerosis/calcification is present, without any evidence of aortic stenosis. Pulmonic Valve: The pulmonic valve was not well visualized. Pulmonic valve regurgitation is not visualized. Aorta: The aortic root is normal in size and structure. Venous: The inferior vena cava is dilated in size with less than 50% respiratory variability, suggesting right atrial pressure of 15 mmHg. IAS/Shunts: No atrial level shunt detected by color flow Doppler.  LEFT VENTRICLE PLAX 2D LVIDd:         5.90 cm  Diastology LVIDs:         4.80 cm  LV e' lateral:   4.13 cm/s LV PW:         0.80 cm  LV E/e' lateral: 11.0 LV IVS:        1.10 cm  LV e' medial:    4.13 cm/s LVOT diam:     2.20 cm  LV E/e' medial:  11.0 LVOT Area:     3.80 cm  LEFT ATRIUM LA diam:    3.40 cm   AORTA Ao Root diam: 3.60 cm MITRAL VALVE               TRICUSPID VALVE MV Area (PHT): 3.60 cm    TR Peak grad:   15.1 mmHg MV Decel Time: 211 msec    TR Vmax:        194.00 cm/s MV E velocity: 45.40 cm/s MV A velocity: 87.40 cm/s  SHUNTS MV E/A ratio:  0.52        Systemic Diam: 2.20 cm Dani Gobble Croitoru MD  Electronically signed by Sanda Klein MD Signature Date/Time: 01/23/2020/5:37:49 PM    Final     Time Spent in minutes  30   Lala Lund M.D on 01/29/2020 at 8:50 AM  To page go to [email or web address] - password [facility]

## 2020-01-29 NOTE — Plan of Care (Signed)
  Problem: Health Behavior/Discharge Planning: Goal: Ability to manage health-related needs will improve Outcome: Progressing   Problem: Clinical Measurements: Goal: Ability to maintain clinical measurements within normal limits will improve Outcome: Progressing   

## 2020-01-29 NOTE — Progress Notes (Signed)
NAME:  Jon Baker, MRN:  [id number], DOB:  [birth year], LOS: 6 ADMISSION DATE:  01/23/2020, CONSULTATION DATE:  01/23/20 REFERRING MD:  Criss Alvine - EM, CHIEF COMPLAINT:  Acute respiratory failure, AMS  Brief History   86 yo male smoker brought to ER unresponsive and had fever 102.7.  Intubated in ER and required pressors.  Found to have COVID 19 pneumonia.  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  Past Medical History  COPD, ILD, combined CHF, HTN, CAD, Depression, HLD, Gallstone pancreatitis, RBBB  Significant Hospital Events   2/14 Admit 2/16 extubated 2/17 transfer out of ICU  Consults:  Cardiology  Procedures:  2/14 ETT > 2/16  Significant Diagnostic Tests:  CT Head  2/14 >> chronic small vessel changes, nothing acute Echocardiogram 2/14 >> LVEF 35-40% with regional wall motion abnormalities, grade 1 diastolic dysfunction, mildly reduced RV function and mildly elevated PASP  Micro Data:  2/14 SARS Cov2 >> positive 2/14 Flu A/B >> both negative Blood 2/14 >> NGTD  Antimicrobials:  Remdesivir 2/14 > Dexamethasone 2/14 >  COVID Therapy:  Ceftriaxone 2/14 >  Azithromycin 2/14 >   Interim history/subjective:  No reported overnight issues. Sitting up in a chair.   Objective   Blood pressure (!) 165/89, pulse (!) 52, temperature 98.6 F (37 C), temperature source Oral, resp. rate 16, height 5\' 11"  (1.803 m), weight 103.8 kg, SpO2 92 %.        Intake/Output Summary (Last 24 hours) at 01/29/2020 1138 Last data filed at 01/29/2020 1010 Gross per 24 hour  Intake 690 ml  Output 2350 ml  Net -1660 ml   Filed Weights   01/27/20 0635 01/28/20 0519 01/29/20 0520  Weight: 105.4 kg 104.7 kg 103.8 kg    Examination: General:  Elderly gentleman in NAD Neuro: Alert, oriented, moves all 4 extremities. Confused and HOH.  HEENT:  [place]/AT, No JVD noted,  PERRL Cardiovascular:  RRR. HR in the 80s.  Lungs:  Breath sounds in bilateral lung fields, good air entry, some wheezing with forced expiration.  Abdomen:  Soft, non-distended, non-tender.   Resolved Hospital Problem list   Anion gap metabolic acidosis, Dysphagia  Assessment & Plan:   Acute on chronic hypoxic respiratory failure from COVID 19 pneumonia with possible bacterial superinfection. - day 5/5 of remdesivir - day 5/10 of decadron - day 5/5 of ABx - f/u CXR intermittently - goal SpO2 88 to 95%. Currently on 4-5L [place]. Breathing comfortable.   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 - Continue dulera, incruse - prn albuterol - has finished decadron, back on home prednisone 10 mg.  - arranging for outpatient follow up with Dr. 01/31/20 after discharge.   Elevated troponin from demand ischemia. Hx of CAD, chronic combined CHF, HTN. - Cardiology has signed off with recommendations for supportive care.  - continue ASA - continue coreg, lipitor - diuresis as tolerated  Hematuria. Elevated D Dimer - venous dopplers negative  PCCM will see as needed. Outpatient follow up for chronic lung disease will be arranged. Please don't hesitate to contact Delton Coombes if we can be of further assistance.   [place], MD Pulmonary and Critical Care Medicine [facility] Pager: [phone] Office:[phone]   Labs    CMP Latest Ref Rng & Units 01/29/2020 01/28/2020 01/27/2020  Glucose 70 - 99 mg/dL 01/29/2020) 993(Z) 169(C)  BUN 8 - 23 mg/dL 789(F)  38(H) 36(H)  Creatinine 0.61 - 1.24 mg/dL 1.44(H) 1.27(H) 1.07  Sodium 135 - 145 mmol/L 140 143 141  Potassium 3.5 - 5.1 mmol/L 3.6 3.5 3.5  Chloride 98 - 111 mmol/L 100 103 103  CO2 22 - 32 mmol/L 30 31 27   Calcium 8.9 - 10.3 mg/dL 7.7(L) 7.9(L) 8.0(L)  Total Protein 6.5 - 8.1 g/dL 5.0(L) 5.0(L) -  Total Bilirubin 0.3 - 1.2 mg/dL 1.0 0.9 -  Alkaline Phos 38 - 126 U/L 48 44 -  AST 15 - 41 U/L 66(H) 77(H) -  ALT 0  - 44 U/L 101(H) 107(H) -    CBC Latest Ref Rng & Units 01/29/2020 01/28/2020 01/27/2020  WBC 4.0 - 10.5 K/uL 15.6(H) 16.7(H) 20.6(H)  Hemoglobin 13.0 - 17.0 g/dL 11.3(L) 11.0(L) 12.7(L)  Hematocrit 39.0 - 52.0 % 33.0(L) 32.7(L) 37.4(L)  Platelets 150 - 400 K/uL 158 150 174    ABG    Component Value Date/Time   PHART 7.395 01/24/2020 0459   PCO2ART 41.1 01/24/2020 0459   PO2ART 115.0 (H) 01/24/2020 0459   HCO3 25.2 01/24/2020 0459   TCO2 26 01/24/2020 0459   ACIDBASEDEF 1.0 01/23/2020 1545   O2SAT 98.0 01/24/2020 0459

## 2020-01-30 LAB — COMPREHENSIVE METABOLIC PANEL
ALT: 85 U/L — ABNORMAL HIGH (ref 0–44)
AST: 58 U/L — ABNORMAL HIGH (ref 15–41)
Albumin: 2.6 g/dL — ABNORMAL LOW (ref 3.5–5.0)
Alkaline Phosphatase: 43 U/L (ref 38–126)
Anion gap: 11 (ref 5–15)
BUN: 38 mg/dL — ABNORMAL HIGH (ref 8–23)
CO2: 25 mmol/L (ref 22–32)
Calcium: 7.6 mg/dL — ABNORMAL LOW (ref 8.9–10.3)
Chloride: 103 mmol/L (ref 98–111)
Creatinine, Ser: 1.11 mg/dL (ref 0.61–1.24)
GFR calc Af Amer: >60 mL/min (ref >60)
GFR calc non Af Amer: >60 mL/min (ref >60)
Glucose, Bld: 132 mg/dL — ABNORMAL HIGH (ref 70–99)
Potassium: 4.2 mmol/L (ref 3.5–5.1)
Sodium: 139 mmol/L (ref 135–145)
Total Bilirubin: 1 mg/dL (ref 0.3–1.2)
Total Protein: 4.8 g/dL — ABNORMAL LOW (ref 6.5–8.1)

## 2020-01-30 LAB — CBC WITH DIFFERENTIAL/PLATELET
Abs Immature Granulocytes: 0.21 10*3/uL — ABNORMAL HIGH (ref 0.00–0.07)
Basophils Absolute: 0 10*3/uL (ref 0.0–0.1)
Basophils Relative: 0 %
Eosinophils Absolute: 0 10*3/uL (ref 0.0–0.5)
Eosinophils Relative: 0 %
HCT: 33 % — ABNORMAL LOW (ref 39.0–52.0)
Hemoglobin: 11.2 g/dL — ABNORMAL LOW (ref 13.0–17.0)
Immature Granulocytes: 1 %
Lymphocytes Relative: 2 %
Lymphs Abs: 0.3 10*3/uL — ABNORMAL LOW (ref 0.7–4.0)
MCH: 32.7 pg (ref 26.0–34.0)
MCHC: 33.9 g/dL (ref 30.0–36.0)
MCV: 96.5 fL (ref 80.0–100.0)
Monocytes Absolute: 0.6 10*3/uL (ref 0.1–1.0)
Monocytes Relative: 4 %
Neutro Abs: 14 10*3/uL — ABNORMAL HIGH (ref 1.7–7.7)
Neutrophils Relative %: 93 %
Platelets: 137 10*3/uL — ABNORMAL LOW (ref 150–400)
RBC: 3.42 MIL/uL — ABNORMAL LOW (ref 4.22–5.81)
RDW: 12.6 % (ref 11.5–15.5)
WBC: 15.2 10*3/uL — ABNORMAL HIGH (ref 4.0–10.5)
nRBC: 0 % (ref 0.0–0.2)

## 2020-01-30 LAB — MAGNESIUM: Magnesium: 2.1 mg/dL (ref 1.7–2.4)

## 2020-01-30 LAB — D-DIMER, QUANTITATIVE: D-Dimer, Quant: 8.51 ug/mL-FEU — ABNORMAL HIGH (ref 0.00–0.50)

## 2020-01-30 LAB — BRAIN NATRIURETIC PEPTIDE: B Natriuretic Peptide: 453 pg/mL — ABNORMAL HIGH (ref 0.0–100.0)

## 2020-01-30 LAB — C-REACTIVE PROTEIN: CRP: 0.6 mg/dL (ref <1.0)

## 2020-01-30 MED ORDER — SPIRONOLACTONE 25 MG PO TABS
25.0000 mg | ORAL_TABLET | Freq: Every day | ORAL | Status: DC
  Administered 2020-01-30 – 2020-02-01 (×3): 25 mg via ORAL
  Filled 2020-01-30 (×3): qty 1

## 2020-01-30 MED ORDER — ISOSORB DINITRATE-HYDRALAZINE 20-37.5 MG PO TABS
2.0000 | ORAL_TABLET | Freq: Three times a day (TID) | ORAL | Status: DC
  Administered 2020-01-30 – 2020-02-01 (×6): 2 via ORAL
  Filled 2020-01-30 (×6): qty 2

## 2020-01-30 MED ORDER — FUROSEMIDE 10 MG/ML IJ SOLN
40.0000 mg | Freq: Once | INTRAMUSCULAR | Status: AC
  Administered 2020-01-30: 10:00:00 40 mg via INTRAVENOUS
  Filled 2020-01-30: qty 4

## 2020-01-30 MED ORDER — FUROSEMIDE 80 MG PO TABS
80.0000 mg | ORAL_TABLET | Freq: Every day | ORAL | Status: DC
  Administered 2020-01-31: 80 mg via ORAL
  Filled 2020-01-30 (×2): qty 1

## 2020-01-30 NOTE — Progress Notes (Signed)
PROGRESS NOTE                                                                                                                                                                                                             Patient Demographics:    Jon Baker, is a 86 y.o. male, DOB - 1931/12/15, JOI:[id number]  Admit date - 01/23/2020   Admitting Physician Adewale Clearence Ped, MD  Outpatient Primary MD for the patient is [name], [name], MD  LOS - 7  CC - SOB     Brief Narrative  -  86 yo male smoker brought to ER unresponsive and had fever 102.7.  Intubated in ER and required pressors.  Found to have COVID 19 pneumonia.  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  Significant Hospital Events   2/14 SARS Cov2 >> positive 2/14 Flu A/B >> both negative Blood 2/14 >>  2/14 Admit 2/16 extubated 2/17 transfer out of ICU    Subjective:   Patient in bed having breakfast, denies any headache, no chest abdominal pain, shortness of breath much improved.   Assessment  & Plan :     1.  Acute on chronic hypoxic respiratory failure in a patient with advanced COPD who uses 3 L of nasal cannula oxygen at home along with ILD. This was due to combination of COVID-19 pneumonia and now looks to have some element of acute on chronic systolic CHF with EF 47% superimposed on his advanced COPD.  He needed to be intubated, was extubated on the 17th of this month, was under the care of ICU and transferred out of ICU on 01/27/2020 under my care.  He is finishing his remdesivir course for COVID-19 pneumonia, continues to have wheezing hence I will continue IV steroids, of note he takes 10 mg of prednisone chronically at home, will continue IV Lasix for fluid overload, dose adjusted on 01/30/2020 for acute on chronic systolic CHF.    He is much improved with IV steroids and diuretics,  wheezing and crackles have improved, symptoms have improved, currently on 4 to 5 L nasal cannula oxygen which is much higher than his home dose of 3-4 L, advance activity, add flutter valve and I-S for pulmonary toiletry, try to taper down oxygen as tolerated.  Monitor closely.  2.  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  On 3 L  nasal cannula oxygen at home, oxygen supplementation as needed, continue supportive care, see above.  Tapering IV steroids with caution.  Note he is on long-term oral steroids.  3.  Acute on chronic systolic CHF.  EF 35 to 40%.  Also had demand mismatch related non-ACS pattern troponin rise.  Placed on IV Lasix as tolerated by his blood pressure and renal function, aspirin, Coreg and Lipitor.  Chest pain-free.  Echo noted.  Seen by cardiology this admission.  4.  Acute metabolic encephalopathy secondary to hypoxia.  Improved.  5.  Hematuria.  Has Foley, this started in ICU, monitor.  Currently not on any chemical DVT prophylaxis.  6.  Elevated D-dimer.,  See #5 above, SCDs for now, check lower extremity venous duplex.  7.  CKD 3.  Follow be made.  8.  Deconditioning.  PT OT advance activity.  9. HTN - was on Coreg, Norvasc and BiDil have been added for better control, have doubled BiDil dose on 01/30/2020, will monitor  10.  AKI .  Baseline creatinine is around 1, confirmed with daughter over the phone as she had access to old numbers, AKI seems to have resolved with diuresis will continue with caution.    Family Communication  :   Daughter Ebony Hail cell phone 01/27/2020, 01/28/2020, 01/29/20  Code Status :  DNR  Disposition Plan  : Stay in the hospital for hypoxic respiratory failure, COVID-19 treatment, treatment of acute on chronic systolic CHF exacerbation and COPD exacerbation with IV Lasix and IV steroids.  Consults  : PCCM, cardiology  Procedures  :  CT Head  2/14 >> chronic small vessel changes, nothing acute  Echocardiogram  2/14 >> LVEF 35-40% with regional wall motion abnormalities, grade 1 diastolic dysfunction, mildly reduced RV function and mildly elevated PASP   DVT Prophylaxis  : SCDs, not on chemical prophylaxis due to hematuria  Lab Results  Component Value Date   PLT 137 (L) 01/30/2020    Diet :  Diet Order            Diet Heart Room service appropriate? Yes; Fluid consistency: Thin  Diet effective now               Inpatient Medications Scheduled Meds: . albuterol  4 puff Inhalation Q4H  . amLODipine  10 mg Oral Daily  . aspirin  325 mg Oral Daily  . atorvastatin  80 mg Oral QHS  . carvedilol  12.5 mg Oral BID WC  . chlorhexidine gluconate (MEDLINE KIT)  15 mL Mouth Rinse BID  . Chlorhexidine Gluconate Cloth  6 each Topical Daily  . furosemide  40 mg Intravenous Once  . [START ON 01/31/2020] furosemide  80 mg Oral Daily  . isosorbide-hydrALAZINE  1 tablet Oral TID  . mouth rinse  15 mL Mouth Rinse BID  . methylPREDNISolone (SOLU-MEDROL) injection  40 mg Intravenous Q12H  . mometasone-formoterol  2 puff Inhalation BID  . spironolactone  25 mg Oral Daily  . umeclidinium bromide  1 puff Inhalation Daily   Continuous Infusions:  PRN Meds:.guaiFENesin-dextromethorphan, hydrALAZINE  Antibiotics  :   Anti-infectives (From admission, onward)   Start     Dose/Rate Route Frequency Ordered Stop   01/24/20 1400  azithromycin (ZITHROMAX) 500 mg in sodium chloride 0.9 % 250 mL IVPB  Status:  Discontinued     500 mg 250 mL/hr over 60 Minutes Intravenous Daily 01/23/20 1620 01/27/20 1605   01/24/20 1400  cefTRIAXone (ROCEPHIN) 2 g in sodium chloride 0.9 % 100  mL IVPB     2 g 200 mL/hr over 30 Minutes Intravenous Daily 01/23/20 1620 01/29/20 1406   01/24/20 1000  remdesivir 100 mg in sodium chloride 0.9 % 100 mL IVPB     100 mg 200 mL/hr over 30 Minutes Intravenous Daily 01/23/20 1853 01/27/20 0918   01/23/20 1900  remdesivir 200 mg in sodium chloride 0.9% 250 mL IVPB     200 mg 580 mL/hr  over 30 Minutes Intravenous Once 01/23/20 1853 01/23/20 1952   01/23/20 1330  cefTRIAXone (ROCEPHIN) 2 g in sodium chloride 0.9 % 100 mL IVPB     2 g 200 mL/hr over 30 Minutes Intravenous  Once 01/23/20 1323 01/23/20 1421   01/23/20 1330  azithromycin (ZITHROMAX) 500 mg in sodium chloride 0.9 % 250 mL IVPB     500 mg 250 mL/hr over 60 Minutes Intravenous  Once 01/23/20 1324 01/23/20 1452          Objective:   Vitals:   01/29/20 2149 01/30/20 0524 01/30/20 0753 01/30/20 0834  BP: 119/86 (!) 131/91 (!) 167/100 (!) 154/99  Pulse: 65 66 63 67  Resp: 18 18    Temp: 97.6 F (36.4 C) 98.2 F (36.8 C)    TempSrc: Oral Oral    SpO2: 95% 96% 95% 93%  Weight:  104.8 kg    Height:        SpO2: 93 % O2 Flow Rate (L/min): 5 L/min FiO2 (%): 40 %  Wt Readings from Last 3 Encounters:  01/30/20 104.8 kg     Intake/Output Summary (Last 24 hours) at 01/30/2020 0850 Last data filed at 01/30/2020 0529 Gross per 24 hour  Intake 480 ml  Output 1550 ml  Net -1070 ml     Physical Exam  Awake Alert, No new F.N deficits, Normal affect [place].AT,PERRAL Supple Neck,No JVD, No cervical lymphadenopathy appriciated.  Symmetrical Chest wall movement, Mod air movement bilaterally, + upper airway wheezing, mild rales, no stridor RRR,No Gallops, Rubs or new Murmurs, No Parasternal Heave +ve B.Sounds, Abd Soft, No tenderness, No organomegaly appriciated, No rebound - guarding or rigidity. No Cyanosis, Clubbing or edema, No new Rash or bruise     Data Review:    CBC Recent Labs  Lab 01/23/20 1319 01/23/20 1545 01/26/20 0310 01/27/20 0500 01/28/20 0713 01/29/20 0542 01/30/20 0500  WBC 12.0*   < > 26.0* 20.6* 16.7* 15.6* 15.2*  HGB 14.7   < > 13.2 12.7* 11.0* 11.3* 11.2*  HCT 45.4   < > 39.1 37.4* 32.7* 33.0* 33.0*  PLT 200   < > 180 174 150 158 137*  MCV 101.8*   < > 97.0 96.4 96.7 94.8 96.5  MCH 33.0   < > 32.8 32.7 32.5 32.5 32.7  MCHC 32.4   < > 33.8 34.0 33.6 34.2 33.9  RDW 13.1    < > 13.0 13.2 13.1 12.9 12.6  LYMPHSABS 0.9  --   --   --  0.6* 0.4* 0.3*  MONOABS 1.1*  --   --   --  1.1* 0.6 0.6  EOSABS 0.1  --   --   --  0.0 0.0 0.0  BASOSABS 0.1  --   --   --  0.0 0.0 0.0   < > = values in this interval not displayed.    Chemistries  Recent Labs  Lab 01/24/20 0333 01/24/20 0459 01/25/20 0326 01/25/20 0326 01/26/20 0310 01/27/20 0500 01/28/20 0713 01/29/20 0542 01/30/20 0500  NA 141   < >  141   < > 143 141 143 140 139  K 3.5   < > 3.6   < > 3.7 3.5 3.5 3.6 4.2  CL 103   < > 105   < > 105 103 103 100 103  CO2 22   < > 25   < > '25 27 31 30 25  '$ GLUCOSE 133*   < > 143*   < > 112* 120* 119* 143* 132*  BUN 31*   < > 48*   < > 41* 36* 38* 40* 38*  CREATININE 1.69*   < > 1.77*   < > 1.18 1.07 1.27* 1.44* 1.11  CALCIUM 7.8*   < > 7.7*   < > 8.0* 8.0* 7.9* 7.7* 7.6*  MG 1.9  --   --   --   --   --  2.1 2.2 2.1  AST 146*   < > 145*  --  135*  --  77* 66* 58*  ALT 109*   < > 147*  --  177*  --  107* 101* 85*  ALKPHOS 58   < > 51  --  56  --  44 48 43  BILITOT 1.0   < > 0.7  --  1.0  --  0.9 1.0 1.0   < > = values in this interval not displayed.   ------------------------------------------------------------------------------------------------------------------ No results for input(s): CHOL, HDL, LDLCALC, TRIG, CHOLHDL, LDLDIRECT in the last 72 hours.  No results found for: HGBA1C ------------------------------------------------------------------------------------------------------------------ No results for input(s): TSH, T4TOTAL, T3FREE, THYROIDAB in the last 72 hours.  Invalid input(s): FREET3 ------------------------------------------------------------------------------------------------------------------ No results for input(s): VITAMINB12, FOLATE, FERRITIN, TIBC, IRON, RETICCTPCT in the last 72 hours.  Coagulation profile Recent Labs  Lab 01/23/20 1319  INR 1.2    Recent Labs    01/29/20 0542 01/30/20 0500  DDIMER 8.14* 8.51*    Cardiac  Enzymes No results for input(s): CKMB, TROPONINI, MYOGLOBIN in the last 168 hours.  Invalid input(s): CK ------------------------------------------------------------------------------------------------------------------    Component Value Date/Time   BNP 453.0 (H) 01/30/2020 0500    Micro Results Recent Results (from the past 240 hour(s))  Respiratory Panel by RT PCR (Flu A&B, Covid) - Nasopharyngeal Swab     Status: Abnormal   Collection Time: 01/23/20  1:06 PM   Specimen: Nasopharyngeal Swab  Result Value Ref Range Status   SARS Coronavirus 2 by RT PCR POSITIVE (A) NEGATIVE Final    Comment: RESULT CALLED TO, READ BACK BY AND VERIFIED WITH: P. PULLIAM, RN AT 2355 ON 01/23/20 BY C. JESSUP, MT. (NOTE) SARS-CoV-2 target nucleic acids are DETECTED. SARS-CoV-2 RNA is generally detectable in upper respiratory specimens  during the acute phase of infection. Positive results are indicative of the presence of the identified virus, but do not rule out bacterial infection or co-infection with other pathogens not detected by the test. Clinical correlation with patient history and other diagnostic information is necessary to determine patient infection status. The expected result is Negative. Fact Sheet for Patients:  PinkCheek.be Fact Sheet for Healthcare Providers: GravelBags.it This test is not yet approved or cleared by the [place] FDA and  has been authorized for detection and/or diagnosis of SARS-CoV-2 by FDA under an Emergency Use Authorization (EUA).  This EUA will remain in effect (meaning this tes t can be used) for the duration of  the COVID-19 declaration under Section 564(b)(1) of the Act, 21 U.S.C. section 360bbb-3(b)(1), unless the authorization is terminated or revoked sooner.    Influenza  A by PCR NEGATIVE NEGATIVE Final   Influenza B by PCR NEGATIVE NEGATIVE Final    Comment: (NOTE) The Xpert Xpress  SARS-CoV-2/FLU/RSV assay is intended as an aid in  the diagnosis of influenza from Nasopharyngeal swab specimens and  should not be used as a sole basis for treatment. Nasal washings and  aspirates are unacceptable for Xpert Xpress SARS-CoV-2/FLU/RSV  testing. Fact Sheet for Patients: PinkCheek.be Fact Sheet for Healthcare Providers: GravelBags.it This test is not yet approved or cleared by the [place] FDA and  has been authorized for detection and/or diagnosis of SARS-CoV-2 by  FDA under an Emergency Use Authorization (EUA). This EUA will remain  in effect (meaning this test can be used) for the duration of the  Covid-19 declaration under Section 564(b)(1) of the Act, 21  U.S.C. section 360bbb-3(b)(1), unless the authorization is  terminated or revoked. Performed at [facility], [place] [address], [place], [place] [address]   Culture, blood (routine x 2)     Status: None   Collection Time: 01/23/20  1:21 PM   Specimen: BLOOD RIGHT ARM  Result Value Ref Range Status   Specimen Description BLOOD RIGHT ARM  Final   Special Requests   Final    BOTTLES DRAWN AEROBIC ONLY Blood Culture results may not be optimal due to an inadequate volume of blood received in culture bottles   Culture   Final    NO GROWTH 5 DAYS Performed at [facility], [place] [address] [place], [place] [address]    Report Status 01/28/2020 FINAL  Final  Culture, blood (routine x 2)     Status: None   Collection Time: 01/23/20  1:47 PM   Specimen: BLOOD RIGHT FOREARM  Result Value Ref Range Status   Specimen Description BLOOD RIGHT FOREARM  Final   Special Requests   Final    BOTTLES DRAWN AEROBIC AND ANAEROBIC Blood Culture results may not be optimal due to an inadequate volume of blood received in culture bottles   Culture   Final    NO GROWTH 5 DAYS Performed at [facility], [place] [address], [place], [place] [address]    Report  Status 01/28/2020 FINAL  Final    Radiology Reports DG Abd 1 View  Result Date: 01/23/2020 CLINICAL DATA:  Check feeding catheter placement EXAM: ABDOMEN - 1 VIEW COMPARISON:  None. FINDINGS: Gastric catheter is noted with the tip in the distal stomach. Scattered large and small bowel gas is noted. IMPRESSION: Gastric catheter within the distal stomach. Electronically Signed   By: Inez Catalina M.D.   On: 01/23/2020 16:38   CT Head Wo Contrast  Result Date: 01/23/2020 CLINICAL DATA:  Encephalopathy, unresponsive EXAM: CT HEAD WITHOUT CONTRAST TECHNIQUE: Contiguous axial images were obtained from the base of the skull through the vertex without intravenous contrast. COMPARISON:  07/30/2010 FINDINGS: Brain: Confluent hypodensities throughout the periventricular white matter are compatible with age-indeterminate small vessel ischemic changes, favor chronic. No other signs of acute infarct or hemorrhage. Lateral ventricles and remaining midline structures are unremarkable. No acute extra-axial fluid collections. No mass effect. Vascular: No hyperdense vessel or unexpected calcification. Skull: Normal. Negative for fracture or focal lesion. Sinuses/Orbits: Mucoperiosteal thickening is seen within the bilateral ethmoid air cells. Other: None IMPRESSION: 1. Likely chronic small-vessel ischemic changes throughout the white matter. 2. No acute intracranial process otherwise. Electronically Signed   By: Randa Ngo M.D.   On: 01/23/2020 15:02   DG Chest Port 1  View  Result Date: 01/28/2020 CLINICAL DATA:  Shortness of breath. COPD. Additional history provided by technologist: Wheezing, COVID positive. EXAM: PORTABLE CHEST 1 VIEW COMPARISON:  Chest radiograph 01/23/2020 FINDINGS: Interval extubation.  Overlying cardiac monitoring leads. Unchanged cardiomegaly.  Aortic atherosclerosis. There has been significant interval improvement of apical predominant bilateral pulmonary opacities. Minimal linear atelectasis  at the right lung base no evidence of pleural effusion or pneumothorax. No acute bony abnormality. IMPRESSION: Interval extubation. Significant interval improvement of apical predominant bilateral pulmonary opacities. Findings likely reflect sequela of pneumonia given provided history. Minimal linear atelectasis at the right lung base. Electronically Signed   By: Kellie Simmering DO   On: 01/28/2020 09:25   DG Chest Portable 1 View  Result Date: 01/23/2020 CLINICAL DATA:  Respiratory failure EXAM: PORTABLE CHEST 1 VIEW COMPARISON:  June 24, 2019 FINDINGS: Endotracheal tube is identified distal tip 6.3 cm from carina. Increased pulmonary interstitium is identified in the bilateral upper lobes and right mid lung. There is no pleural effusion. The aorta is tortuous. The heart size is upper limits are normal. A feeding tube is identified with distal tip not included on film. IMPRESSION: Increased pulmonary interstitium in the bilateral upper lobes and right mid lung, findings are suspicious for developing pneumonias. Electronically Signed   By: Abelardo Diesel M.D.   On: 01/23/2020 13:59   VAS [place] LOWER EXTREMITY VENOUS (DVT)  Result Date: 01/28/2020  Lower Venous DVTStudy Indications: Elevated Ddimer.  Risk Factors: COVID 19 positive. Comparison Study: No prior studies. Performing Technologist: Oliver Hum RVT  Examination Guidelines: A complete evaluation includes B-mode imaging, spectral Doppler, color Doppler, and power Doppler as needed of all accessible portions of each vessel. Bilateral testing is considered an integral part of a complete examination. Limited examinations for reoccurring indications may be performed as noted. The reflux portion of the exam is performed with the patient in reverse Trendelenburg.  +---------+---------------+---------+-----------+----------+--------------+ RIGHT    CompressibilityPhasicitySpontaneityPropertiesThrombus Aging  +---------+---------------+---------+-----------+----------+--------------+ CFV      Full           Yes      Yes                                 +---------+---------------+---------+-----------+----------+--------------+ SFJ      Full                                                        +---------+---------------+---------+-----------+----------+--------------+ FV Prox  Full                                                        +---------+---------------+---------+-----------+----------+--------------+ FV Mid   Full                                                        +---------+---------------+---------+-----------+----------+--------------+ FV DistalFull                                                        +---------+---------------+---------+-----------+----------+--------------+  PFV      Full                                                        +---------+---------------+---------+-----------+----------+--------------+ POP      Full           Yes      Yes                                 +---------+---------------+---------+-----------+----------+--------------+ PTV      Full                                                        +---------+---------------+---------+-----------+----------+--------------+ PERO     Full                                                        +---------+---------------+---------+-----------+----------+--------------+   +---------+---------------+---------+-----------+----------+--------------+ LEFT     CompressibilityPhasicitySpontaneityPropertiesThrombus Aging +---------+---------------+---------+-----------+----------+--------------+ CFV      Full           Yes      Yes                                 +---------+---------------+---------+-----------+----------+--------------+ SFJ      Full                                                         +---------+---------------+---------+-----------+----------+--------------+ FV Prox  Full                                                        +---------+---------------+---------+-----------+----------+--------------+ FV Mid   Full                                                        +---------+---------------+---------+-----------+----------+--------------+ FV DistalFull                                                        +---------+---------------+---------+-----------+----------+--------------+ PFV      Full                                                        +---------+---------------+---------+-----------+----------+--------------+  POP      Full           Yes      Yes                                 +---------+---------------+---------+-----------+----------+--------------+ PTV      Full                                                        +---------+---------------+---------+-----------+----------+--------------+ PERO     Full                                                        +---------+---------------+---------+-----------+----------+--------------+     Summary: RIGHT: - There is no evidence of deep vein thrombosis in the lower extremity.  - No cystic structure found in the popliteal fossa.  LEFT: - There is no evidence of deep vein thrombosis in the lower extremity.  - No cystic structure found in the popliteal fossa.  *See table(s) above for measurements and observations. Electronically signed by Deitra Mayo MD on 01/28/2020 at 10:15:57 AM.    Final    ECHOCARDIOGRAM LIMITED  Result Date: 01/23/2020    ECHOCARDIOGRAM REPORT   Patient Name:   LIONELL MATUSZAK Date of Exam: 01/23/2020 Medical Rec #:  [id number]       Height:       71.0 in Accession #:    [id number]      Weight: Date of Birth:  [birth year]       BSA: Patient Age:    53 years        BP:           99/74 mmHg Patient Gender: M               HR:           62 bpm. Exam  Location:  Inpatient Procedure: Limited Echo, Cardiac Doppler and Limited Color Doppler Indications:    Cardiomegaly 429.3  History:        Patient has no prior history of Echocardiogram examinations.                 CHF, CAD; COPD and Covid.  Sonographer:    Johny Chess Referring Phys: [id number] [place]  1. Left ventricular ejection fraction, by estimation, is 35 to 40%. The left ventricle has moderately decreased function. The left ventricle demonstrates regional wall motion abnormalities (see scoring diagram/findings for description). The left ventricular internal cavity size was mildly dilated. Left ventricular diastolic parameters are consistent with Grade I diastolic dysfunction (impaired relaxation). There is moderate hypokinesis of the left ventricular, mid-apical anteroseptal wall, anterior wall and apical segment.  2. Right ventricular systolic function is mildly reduced. The right ventricular size is mildly enlarged. There is mildly elevated pulmonary artery systolic pressure.  3. Left atrial size was mildly dilated.  4. The mitral valve is normal in structure and function. No evidence of mitral valve regurgitation.  5. The aortic valve is tricuspid. Aortic valve regurgitation is not visualized. Mild to moderate aortic valve sclerosis/calcification is present, without any  evidence of aortic stenosis.  6. The inferior vena cava is dilated in size with <50% respiratory variability, suggesting right atrial pressure of 15 mmHg. This is a nonspecific finding during positive pressure ventilation. FINDINGS  Left Ventricle: Left ventricular ejection fraction, by estimation, is 35 to 40%. The left ventricle has moderately decreased function. The left ventricle demonstrates regional wall motion abnormalities. Moderate hypokinesis of the left ventricular, mid-apical anteroseptal wall, anterior wall and apical segment. The left ventricular internal cavity size was mildly dilated. There is no  left ventricular hypertrophy. Left ventricular diastolic parameters are consistent with Grade I diastolic dysfunction (impaired relaxation). Normal left ventricular filling pressure.  LV Wall Scoring: The mid and distal anterior wall, mid anteroseptal segment, and apex are hypokinetic. Right Ventricle: The right ventricular size is mildly enlarged. No increase in right ventricular wall thickness. Right ventricular systolic function is mildly reduced. There is mildly elevated pulmonary artery systolic pressure. The tricuspid regurgitant  velocity is 1.94 m/s, and with an assumed right atrial pressure of 15 mmHg, the estimated right ventricular systolic pressure is 09.6 mmHg. Left Atrium: Left atrial size was mildly dilated. Right Atrium: Right atrial size was not well visualized. Pericardium: There is no evidence of pericardial effusion. Mitral Valve: The mitral valve is normal in structure and function. No evidence of mitral valve regurgitation. Tricuspid Valve: The tricuspid valve is normal in structure. Tricuspid valve regurgitation is not demonstrated. Aortic Valve: The aortic valve is tricuspid. Aortic valve regurgitation is not visualized. Mild to moderate aortic valve sclerosis/calcification is present, without any evidence of aortic stenosis. Pulmonic Valve: The pulmonic valve was not well visualized. Pulmonic valve regurgitation is not visualized. Aorta: The aortic root is normal in size and structure. Venous: The inferior vena cava is dilated in size with less than 50% respiratory variability, suggesting right atrial pressure of 15 mmHg. IAS/Shunts: No atrial level shunt detected by color flow Doppler.  LEFT VENTRICLE PLAX 2D LVIDd:         5.90 cm  Diastology LVIDs:         4.80 cm  LV e' lateral:   4.13 cm/s LV PW:         0.80 cm  LV E/e' lateral: 11.0 LV IVS:        1.10 cm  LV e' medial:    4.13 cm/s LVOT diam:     2.20 cm  LV E/e' medial:  11.0 LVOT Area:     3.80 cm  LEFT ATRIUM LA diam:    3.40 cm    AORTA Ao Root diam: 3.60 cm MITRAL VALVE               TRICUSPID VALVE MV Area (PHT): 3.60 cm    TR Peak grad:   15.1 mmHg MV Decel Time: 211 msec    TR Vmax:        194.00 cm/s MV E velocity: 45.40 cm/s MV A velocity: 87.40 cm/s  SHUNTS MV E/A ratio:  0.52        Systemic Diam: 2.20 cm Dani Gobble Croitoru MD Electronically signed by Sanda Klein MD Signature Date/Time: 01/23/2020/5:37:49 PM    Final     Time Spent in minutes  30   Lala Lund M.D on 01/30/2020 at 8:50 AM  To page go to [email or web address] - password [facility]

## 2020-01-30 NOTE — Progress Notes (Signed)
   01/30/20 1127  Family/Significant Other Communication  Family/Significant Other Update Called;Updated (pt's daughter Revonda Standard )

## 2020-01-30 NOTE — Progress Notes (Signed)
Foley catheter removed per MD orders at 1359. Condom catheter placed on patient. Will continue to monitor.

## 2020-01-30 NOTE — Progress Notes (Signed)
Patient has not voided since catheter removal. Patient reports no urge to void at this time. Bladder scan performed for 61. Will continue to monitor.  Lyndal Pulley, RN 01/30/2020 6:30 PM

## 2020-01-30 NOTE — Plan of Care (Signed)
  Problem: Education: Goal: Knowledge of General Education information will improve Description: Including pain rating scale, medication(s)/side effects and non-pharmacologic comfort measures Outcome: Progressing   Problem: Clinical Measurements: Goal: Respiratory complications will improve Outcome: Progressing   

## 2020-01-31 LAB — CBC WITH DIFFERENTIAL/PLATELET
Abs Immature Granulocytes: 0.35 10*3/uL — ABNORMAL HIGH (ref 0.00–0.07)
Basophils Absolute: 0 10*3/uL (ref 0.0–0.1)
Basophils Relative: 0 %
Eosinophils Absolute: 0 10*3/uL (ref 0.0–0.5)
Eosinophils Relative: 0 %
HCT: 32.8 % — ABNORMAL LOW (ref 39.0–52.0)
Hemoglobin: 11.1 g/dL — ABNORMAL LOW (ref 13.0–17.0)
Immature Granulocytes: 2 %
Lymphocytes Relative: 2 %
Lymphs Abs: 0.4 10*3/uL — ABNORMAL LOW (ref 0.7–4.0)
MCH: 32.2 pg (ref 26.0–34.0)
MCHC: 33.8 g/dL (ref 30.0–36.0)
MCV: 95.1 fL (ref 80.0–100.0)
Monocytes Absolute: 0.7 10*3/uL (ref 0.1–1.0)
Monocytes Relative: 5 %
Neutro Abs: 13.4 10*3/uL — ABNORMAL HIGH (ref 1.7–7.7)
Neutrophils Relative %: 91 %
Platelets: 179 10*3/uL (ref 150–400)
RBC: 3.45 MIL/uL — ABNORMAL LOW (ref 4.22–5.81)
RDW: 12.8 % (ref 11.5–15.5)
WBC: 14.8 10*3/uL — ABNORMAL HIGH (ref 4.0–10.5)
nRBC: 0 % (ref 0.0–0.2)

## 2020-01-31 LAB — COMPREHENSIVE METABOLIC PANEL
ALT: 83 U/L — ABNORMAL HIGH (ref 0–44)
AST: 50 U/L — ABNORMAL HIGH (ref 15–41)
Albumin: 2.7 g/dL — ABNORMAL LOW (ref 3.5–5.0)
Alkaline Phosphatase: 49 U/L (ref 38–126)
Anion gap: 10 (ref 5–15)
BUN: 37 mg/dL — ABNORMAL HIGH (ref 8–23)
CO2: 29 mmol/L (ref 22–32)
Calcium: 7.8 mg/dL — ABNORMAL LOW (ref 8.9–10.3)
Chloride: 99 mmol/L (ref 98–111)
Creatinine, Ser: 1.1 mg/dL (ref 0.61–1.24)
GFR calc Af Amer: >60 mL/min (ref >60)
GFR calc non Af Amer: >60 mL/min (ref >60)
Glucose, Bld: 132 mg/dL — ABNORMAL HIGH (ref 70–99)
Potassium: 3.8 mmol/L (ref 3.5–5.1)
Sodium: 138 mmol/L (ref 135–145)
Total Bilirubin: 1.2 mg/dL (ref 0.3–1.2)
Total Protein: 4.8 g/dL — ABNORMAL LOW (ref 6.5–8.1)

## 2020-01-31 LAB — C-REACTIVE PROTEIN: CRP: 0.6 mg/dL (ref <1.0)

## 2020-01-31 LAB — MAGNESIUM: Magnesium: 2.2 mg/dL (ref 1.7–2.4)

## 2020-01-31 LAB — BRAIN NATRIURETIC PEPTIDE: B Natriuretic Peptide: 286.6 pg/mL — ABNORMAL HIGH (ref 0.0–100.0)

## 2020-01-31 LAB — D-DIMER, QUANTITATIVE: D-Dimer, Quant: 10.68 ug/mL-FEU — ABNORMAL HIGH (ref 0.00–0.50)

## 2020-01-31 MED ORDER — ENOXAPARIN SODIUM 40 MG/0.4ML ~~LOC~~ SOLN
40.0000 mg | Freq: Two times a day (BID) | SUBCUTANEOUS | Status: DC
  Administered 2020-01-31 – 2020-02-01 (×3): 40 mg via SUBCUTANEOUS
  Filled 2020-01-31 (×3): qty 0.4

## 2020-01-31 MED ORDER — METHYLPREDNISOLONE SODIUM SUCC 40 MG IJ SOLR: 40.0000 mg | Freq: Every day | INTRAMUSCULAR | Status: DC

## 2020-01-31 NOTE — Progress Notes (Signed)
Occupational Therapy Treatment Patient Details Name: Jon Baker MRN: [id number] DOB: October 28, 1932 Today's Date: 01/31/2020    History of present illness 86 yo male smoker brought to ER unresponsive and had fever 102.7.  Intubated in ER and required pressors.  Found to have COVID 19 pneumonia.  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   OT comments  Pt progressing towards OT goals with less O2 requirements needed during session. Guided pt in grooming tasks while standing at sink including washing face and oral care. Pt min guard for sit to stand from recliner chair, standing at sink min guard for > 3 minutes. Pt on 3 L O2, dropping to 84% briefly, but quickly recovering and maintaining in low 90s. BP stable during session as well (see below). Pt noted to experience labored breathing after activity, endorsing fatigue. Instructed pt in pursed lip breathing and encouraged rest break. DC recommendations remain appropriate. Will continue to follow acutely.    Follow Up Recommendations  SNF;Supervision/Assistance - 24 hour    Equipment Recommendations  Other (comment)    Recommendations for Other Services      Precautions / Restrictions Precautions Precautions: Fall;Other (comment)(Airborne) Restrictions Weight Bearing Restrictions: No       Mobility Bed Mobility Overal bed mobility: Needs Assistance             General bed mobility comments: Pt in recliner   Transfers Overall transfer level: Needs assistance Equipment used: Rolling walker (2 wheeled) Transfers: Sit to/from Stand Sit to Stand: Min guard         General transfer comment: Pt able to push up from recliner chair without physical assistance for ADLs at sink    Balance Overall balance assessment: Needs assistance         Standing balance support: Bilateral upper extremity supported Standing  balance-Leahy Scale: Fair                             ADL either performed or assessed with clinical judgement   ADL Overall ADL's : Needs assistance/impaired     Grooming: Wash/dry hands;Wash/dry face;Oral care;Min guard;Standing Grooming Details (indicate cue type and reason): Pt min guard for safety during grooming tasks while standing at sink.                                     Vision       Perception     Praxis      Cognition Arousal/Alertness: Awake/alert Behavior During Therapy: Flat affect Overall Cognitive Status: Impaired/Different from baseline Area of Impairment: Attention;Memory;Following commands;Safety/judgement;Awareness                 Orientation Level: Situation;Place;Time Current Attention Level: Sustained Memory: Decreased short-term memory;Decreased recall of precautions Following Commands: Follows one step commands inconsistently;Follows one step commands with increased time Safety/Judgement: Decreased awareness of safety;Decreased awareness of deficits Awareness: Emergent Problem Solving: Slow processing;Decreased initiation;Difficulty sequencing;Requires verbal cues;Requires tactile cues General Comments: Pt slow to respond to commands, poor motor planning.          Exercises Exercises: Other exercises Other Exercises Other Exercises: Pursed lip breathing   Shoulder Instructions       General Comments Pt received on 3 L O2. O2 briefly dropped to 84% while standing during oral  care, but quickly recovered to 90%. Pt maintained in low 90s. BP at rest was 133/112, after activity BP at 119/78    Pertinent Vitals/ Pain       Pain Assessment: No/denies pain  Home Living                                          Prior Functioning/Environment              Frequency  Min 2X/week        Progress Toward Goals  OT Goals(current goals can now be found in the care plan section)   Progress towards OT goals: Progressing toward goals  Acute Rehab OT Goals Patient Stated Goal: to go home OT Goal Formulation: With patient Time For Goal Achievement: 02/09/20 Potential to Achieve Goals: Good ADL Goals Pt Will Perform Grooming: with set-up;standing Pt Will Perform Upper Body Bathing: with supervision;sitting Pt Will Perform Upper Body Dressing: with set-up;sitting Pt Will Transfer to Toilet: with min guard assist;stand pivot transfer;bedside commode Pt Will Perform Toileting - Clothing Manipulation and hygiene: with min guard assist;sit to/from stand Additional ADL Goal #1: Pt will demonstrate implementation of 3 energy conservation strategies in order to maintain stable O2 stats and increase ADL independence  Plan Discharge plan remains appropriate    Co-evaluation                 AM-PAC OT "6 Clicks" Daily Activity     Outcome Measure   Help from another person eating meals?: A Little Help from another person taking care of personal grooming?: A Little Help from another person toileting, which includes using toliet, bedpan, or urinal?: A Lot Help from another person bathing (including washing, rinsing, drying)?: A Lot Help from another person to put on and taking off regular upper body clothing?: A Little Help from another person to put on and taking off regular lower body clothing?: A Lot 6 Click Score: 15    End of Session Equipment Utilized During Treatment: Gait belt;Oxygen  OT Visit Diagnosis: Unsteadiness on feet (R26.81);Muscle weakness (generalized) (M62.81)   Activity Tolerance Patient tolerated treatment well   Patient Left in chair;with call bell/phone within reach;with chair alarm set   Nurse Communication          Time: [phone] OT Time Calculation (min): 22 min  Charges: OT General Charges $OT Visit: 1 Visit OT Treatments $Self Care/Home Management : 8-22 mins  Layla Maw, OTR/L   Layla Maw 01/31/2020, 1:40 PM

## 2020-01-31 NOTE — Telephone Encounter (Signed)
Called and spoke with Patient's Wife, Jon Baker.  Jon Baker stated Patient is currently still hospitalized at this time, but did want a follow up appointment with Dr. Delton Coombes. Patient is scheduled with Dr. Delton Coombes for hospital follow up for copd and covid, 02/25/20, at 1330.

## 2020-01-31 NOTE — TOC Progression Note (Signed)
Transition of Care [facility]) - Progression Note    Patient Details  Name: Jon Baker MRN: [id number] Date of Birth: 09/16/1932  [facility]) CM/SW Contact  Mearl Latin, LCSW Phone Number: 01/31/2020, 12:53 PM  Clinical Narrative:    CSW spoke with patient's daughter, Jon Baker, again regarding discharge plan. She reported that patient is currently refusing SNF but that she is going to call and see if she can convince him. CSW updated her that [facility] would have a private room for him tomorrow if that helps convince him. She will contact CSW back. If not SNF, patient will require a hospital bed and 3in1 BSC.      Barriers to Discharge: Continued Medical Work up  Expected Discharge Plan and Services   In-house Referral: Clinical Social Work   Post Acute Care Choice: (Uncertain at this time) Living arrangements for the past 2 months: Single Family Home                                       Social Determinants of Health (SDOH) Interventions    Readmission Risk Interventions No flowsheet data found.

## 2020-01-31 NOTE — TOC Progression Note (Signed)
Transition of Care [facility]) - Progression Note    Patient Details  Name: Jon Baker MRN: [id number] Date of Birth: 01/09/1932  [facility]) CM/SW Contact  Mearl Latin, LCSW Phone Number: 01/31/2020, 3:08 PM  Clinical Narrative:    Patient is in agreement to try [facility] and Rehab. CSW made [facility] aware of bed acceptance.      Barriers to Discharge: Continued Medical Work up  Expected Discharge Plan and Services   In-house Referral: Clinical Social Work   Post Acute Care Choice: (Uncertain at this time) Living arrangements for the past 2 months: Single Family Home                                       Social Determinants of Health (SDOH) Interventions    Readmission Risk Interventions No flowsheet data found.

## 2020-01-31 NOTE — Progress Notes (Signed)
Physical Therapy Treatment Patient Details Name: Jon Baker MRN: [id number] DOB: 1932/02/08 Today's Date: 01/31/2020    History of Present Illness 86 yo male smoker brought to ER unresponsive and had fever 102.7.  Intubated in ER and required pressors.  Found to have COVID 19 pneumonia.  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    PT Comments    Pt is resting in recliner at start of therapy. Pt is reluctant to ambulate. He ambulates 2 bouts of 11' with RW and min guard assist with recliner follow. Vitals remain WNL. Pt requires increased time when ambulating as well as when standing. He demonstrates a flat affect and difficulties with motor planning. D/C plans remain appropriate. PT will continue to follow acutely.     Follow Up Recommendations  SNF;Supervision/Assistance - 24 hour     Equipment Recommendations  None recommended by PT       Precautions / Restrictions Precautions Precautions: Fall;Other (comment)(airborne) Restrictions Weight Bearing Restrictions: No    Mobility  Bed Mobility               General bed mobility comments: Pt in recliner   Transfers Overall transfer level: Needs assistance Equipment used: Rolling walker (2 wheeled) Transfers: Sit to/from Stand Sit to Stand: Min guard            Ambulation/Gait Ambulation/Gait assistance: Min guard;+2 safety/equipment Gait Distance (Feet): 140 Feet(70' x 2 with a 3 minute break in between) Assistive device: Rolling walker (2 wheeled) Gait Pattern/deviations: Step-through pattern;Decreased stride length;Drifts right/left;Wide base of support Gait velocity: decreased   General Gait Details: Pt demonstrates decreased to absent heel strike and takes slow, short steps         Balance Overall balance assessment: Needs assistance Sitting-balance support: No upper extremity supported;Feet  supported Sitting balance-Leahy Scale: Fair     Standing balance support: Bilateral upper extremity supported Standing balance-Leahy Scale: Fair Standing balance comment: relies on UE support for balance.                             Cognition Arousal/Alertness: Awake/alert Behavior During Therapy: Flat affect Overall Cognitive Status: Impaired/Different from baseline Area of Impairment: Attention;Memory;Following commands;Safety/judgement;Awareness                 Orientation Level: Situation;Place;Time Current Attention Level: Sustained Memory: Decreased recall of precautions;Decreased short-term memory Following Commands: Follows one step commands consistently;Follows one step commands with increased time Safety/Judgement: Decreased awareness of safety;Decreased awareness of deficits Awareness: Emergent Problem Solving: Slow processing;Decreased initiation;Difficulty sequencing;Requires verbal cues;Requires tactile cues General Comments: Increased processing time and decreased motor planning      Exercises Other Exercises Other Exercises: Pursed lip breathing    General Comments General comments (skin integrity, edema, etc.): Pt is on 3L of 02 via [place]. SpO2 is 95% while sitting in recliner and BP is 141/79 mmHg. After first bout of ambulation, BP is 105/64. After second bout it was 116/84 mmHg and 02 remained in the 90s%.       Pertinent Vitals/Pain Pain Assessment: No/denies pain           PT Goals (current goals can now be found in the care plan section) Acute Rehab PT Goals Patient Stated Goal: to go home PT Goal Formulation: With patient Time For Goal Achievement: 02/09/20 Potential to Achieve Goals: Good Progress towards  PT goals: Progressing toward goals    Frequency    Min 3X/week      PT Plan Current plan remains appropriate       AM-PAC PT "6 Clicks" Mobility   Outcome Measure  Help needed turning from your back to your side  while in a flat bed without using bedrails?: A Lot Help needed moving from lying on your back to sitting on the side of a flat bed without using bedrails?: A Lot Help needed moving to and from a bed to a chair (including a wheelchair)?: A Little Help needed standing up from a chair using your arms (e.g., wheelchair or bedside chair)?: A Little Help needed to walk in hospital room?: A Little Help needed climbing 3-5 steps with a railing? : A Lot 6 Click Score: 15    End of Session Equipment Utilized During Treatment: Gait belt;Oxygen Activity Tolerance: Patient limited by fatigue Patient left: in chair;with call bell/phone within reach;with chair alarm set Nurse Communication: Mobility status PT Visit Diagnosis: Unsteadiness on feet (R26.81);Muscle weakness (generalized) (M62.81);Other abnormalities of gait and mobility (R26.89)     Time: 1610-9604 PT Time Calculation (min) (ACUTE ONLY): 23 min  Charges:  $Therapeutic Activity: 23-37 mins                     Jodelle Green, PT, DPT Acute Rehabilitation Services Office [phone]    Jodelle Green 01/31/2020, 5:53 PM

## 2020-01-31 NOTE — Progress Notes (Signed)
PROGRESS NOTE                                                                                                                                                                                                             Patient Demographics:    Coleman Kalas, is a 86 y.o. male, DOB - Nov 19, 1932, BMW:[id number]  Admit date - 01/23/2020   Admitting Physician Adewale Clearence Ped, MD  Outpatient Primary MD for the patient is [name], [name], MD  LOS - 8  CC - SOB     Brief Narrative  -  86 yo male smoker brought to ER unresponsive and had fever 102.7.  Intubated in ER and required pressors.  Found to have COVID 19 pneumonia.  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  Significant Hospital Events   2/14 SARS Cov2 >> positive 2/14 Flu A/B >> both negative Blood 2/14 >>  2/14 Admit 2/16 extubated 2/17 transfer out of ICU    Subjective:   Patient in bed, appears comfortable, denies any headache, no fever, no chest pain or pressure, no shortness of breath , no abdominal pain. No focal weakness.   Assessment  & Plan :     1.  Acute on chronic hypoxic respiratory failure in a patient with advanced COPD who uses 3 L of nasal cannula oxygen at home along with ILD. This was due to combination of COVID-19 pneumonia and now looks to have some element of acute on chronic systolic CHF with EF 27% superimposed on his advanced COPD.  He needed to be intubated, was extubated on the 17th of this month, was under the care of ICU and transferred out of ICU on 01/27/2020 under my care.  He is finishing his remdesivir course for COVID-19 pneumonia, continues to have wheezing hence I will continue IV steroids, of note he takes 10 mg of prednisone chronically at home, will continue to diurese with Lasix, dose adjusted on 01/30/2020 for acute on chronic systolic CHF.    He is much  improved with IV steroids and diuretics, wheezing and crackles have improved, symptoms have improved, currently on 4 to 5 L nasal cannula oxygen which is much higher than his home dose of 3-4 L, advance activity, add flutter valve and I-S for pulmonary toiletry, try to taper down oxygen as tolerated.  Monitor closely.  2.  Severe COPD with emphysema, ILD from  asbestos related lung injury.  On 3 L nasal cannula oxygen at home, oxygen supplementation as needed, continue supportive care, see above.  Tapering IV steroids with caution.  Note he is on long-term oral steroids.  3.  Acute on chronic systolic CHF.  EF 35 to 40%.  Also had demand mismatch related non-ACS pattern troponin rise.  Placed on IV Lasix as tolerated by his blood pressure and renal function, aspirin, Coreg and Lipitor.  Chest pain-free.  Echo noted.  Seen by cardiology this admission.  4.  Acute metabolic encephalopathy secondary to hypoxia.  Improved.  5.  Hematuria.  Majority has resolved, Foley removed.  6.  Elevated D-dimer.   Was not on chemical prophylaxis due to hematuria and ICU, hematuria resolved, Foley has been removed, lower extremity venous duplex negative, I will start Lovenox and monitor.  Hematuria seems to be stable.  7.  CKD 3.  Follow be made.  8.  Deconditioning.  PT OT advance activity.  9. HTN - was on Coreg, Norvasc and BiDil have been added for better control, have doubled BiDil dose on 01/30/2020, will monitor  10.  AKI .  Baseline creatinine is around 1, confirmed with daughter over the phone as she had access to old numbers, AKI seems to have resolved with diuresis will continue with caution.    Family Communication  :   Daughter Ebony Hail cell phone 01/27/2020, 01/28/2020, 01/29/20, 01/30/20, 01/31/20  Code Status :  DNR  Disposition Plan  : Stay in the hospital for hypoxic respiratory failure, COVID-19 treatment, treatment of acute on chronic systolic CHF exacerbation and COPD exacerbation with IV Lasix  and IV steroids.    SNF versus HH PT.  Patient currently refusing SNF, daughter updated she is going to talk to him as he lives with his 81 year old wife.  I think his risk of falling at home is very high.  Consults  : PCCM, cardiology  Procedures  :  CT Head  2/14 >> chronic small vessel changes, nothing acute  Echocardiogram 2/14 >> LVEF 35-40% with regional wall motion abnormalities, grade 1 diastolic dysfunction, mildly reduced RV function and mildly elevated PASP   DVT Prophylaxis  : SCDs, not on chemical prophylaxis due to hematuria  Lab Results  Component Value Date   PLT 179 01/31/2020    Diet :  Diet Order            Diet Heart Room service appropriate? Yes; Fluid consistency: Thin  Diet effective now               Inpatient Medications Scheduled Meds: . albuterol  4 puff Inhalation Q4H  . amLODipine  10 mg Oral Daily  . aspirin  325 mg Oral Daily  . atorvastatin  80 mg Oral QHS  . carvedilol  12.5 mg Oral BID WC  . chlorhexidine gluconate (MEDLINE KIT)  15 mL Mouth Rinse BID  . enoxaparin (LOVENOX) injection  40 mg Subcutaneous Q12H  . furosemide  80 mg Oral Daily  . isosorbide-hydrALAZINE  2 tablet Oral TID  . mouth rinse  15 mL Mouth Rinse BID  . methylPREDNISolone (SOLU-MEDROL) injection  40 mg Intravenous Q12H  . mometasone-formoterol  2 puff Inhalation BID  . spironolactone  25 mg Oral Daily  . umeclidinium bromide  1 puff Inhalation Daily   Continuous Infusions:  PRN Meds:.guaiFENesin-dextromethorphan, hydrALAZINE  Antibiotics  :   Anti-infectives (From admission, onward)   Start     Dose/Rate Route Frequency Ordered Stop  01/24/20 1400  azithromycin (ZITHROMAX) 500 mg in sodium chloride 0.9 % 250 mL IVPB  Status:  Discontinued     500 mg 250 mL/hr over 60 Minutes Intravenous Daily 01/23/20 1620 01/27/20 1605   01/24/20 1400  cefTRIAXone (ROCEPHIN) 2 g in sodium chloride 0.9 % 100 mL IVPB     2 g 200 mL/hr over 30 Minutes Intravenous Daily  01/23/20 1620 01/29/20 1406   01/24/20 1000  remdesivir 100 mg in sodium chloride 0.9 % 100 mL IVPB     100 mg 200 mL/hr over 30 Minutes Intravenous Daily 01/23/20 1853 01/27/20 0918   01/23/20 1900  remdesivir 200 mg in sodium chloride 0.9% 250 mL IVPB     200 mg 580 mL/hr over 30 Minutes Intravenous Once 01/23/20 1853 01/23/20 1952   01/23/20 1330  cefTRIAXone (ROCEPHIN) 2 g in sodium chloride 0.9 % 100 mL IVPB     2 g 200 mL/hr over 30 Minutes Intravenous  Once 01/23/20 1323 01/23/20 1421   01/23/20 1330  azithromycin (ZITHROMAX) 500 mg in sodium chloride 0.9 % 250 mL IVPB     500 mg 250 mL/hr over 60 Minutes Intravenous  Once 01/23/20 1324 01/23/20 1452          Objective:   Vitals:   01/31/20 0400 01/31/20 0402 01/31/20 0810 01/31/20 0900  BP:    (!) 141/79  Pulse: (!) 59   66  Resp: 17   19  Temp:   98.5 F (36.9 C)   TempSrc:   Oral   SpO2: 94% 94%  95%  Weight:      Height:        SpO2: 95 % O2 Flow Rate (L/min): 3 L/min FiO2 (%): 40 %  Wt Readings from Last 3 Encounters:  01/30/20 104.8 kg     Intake/Output Summary (Last 24 hours) at 01/31/2020 1047 Last data filed at 01/31/2020 0900 Gross per 24 hour  Intake 380 ml  Output 2275 ml  Net -1895 ml     Physical Exam  Awake Alert, No new F.N deficits, Normal affect [place].AT,PERRAL Supple Neck,No JVD, No cervical lymphadenopathy appriciated.  Symmetrical Chest wall movement, Good air movement bilaterally, mild wheezing, no stridor RRR,No Gallops, Rubs or new Murmurs, No Parasternal Heave +ve B.Sounds, Abd Soft, No tenderness, No organomegaly appriciated, No rebound - guarding or rigidity. No Cyanosis, Clubbing or edema, No new Rash or bruise      Data Review:    CBC Recent Labs  Lab 01/27/20 0500 01/28/20 0713 01/29/20 0542 01/30/20 0500 01/31/20 0310  WBC 20.6* 16.7* 15.6* 15.2* 14.8*  HGB 12.7* 11.0* 11.3* 11.2* 11.1*  HCT 37.4* 32.7* 33.0* 33.0* 32.8*  PLT 174 150 158 137* 179  MCV 96.4  96.7 94.8 96.5 95.1  MCH 32.7 32.5 32.5 32.7 32.2  MCHC 34.0 33.6 34.2 33.9 33.8  RDW 13.2 13.1 12.9 12.6 12.8  LYMPHSABS  --  0.6* 0.4* 0.3* 0.4*  MONOABS  --  1.1* 0.6 0.6 0.7  EOSABS  --  0.0 0.0 0.0 0.0  BASOSABS  --  0.0 0.0 0.0 0.0    Chemistries  Recent Labs  Lab 01/26/20 0310 01/26/20 0310 01/27/20 0500 01/28/20 0713 01/29/20 0542 01/30/20 0500 01/31/20 0310  NA 143   < > 141 143 140 139 138  K 3.7   < > 3.5 3.5 3.6 4.2 3.8  CL 105   < > 103 103 100 103 99  CO2 25   < > 27 31 30  25 29  GLUCOSE 112*   < > 120* 119* 143* 132* 132*  BUN 41*   < > 36* 38* 40* 38* 37*  CREATININE 1.18   < > 1.07 1.27* 1.44* 1.11 1.10  CALCIUM 8.0*   < > 8.0* 7.9* 7.7* 7.6* 7.8*  MG  --   --   --  2.1 2.2 2.1 2.2  AST 135*  --   --  77* 66* 58* 50*  ALT 177*  --   --  107* 101* 85* 83*  ALKPHOS 56  --   --  44 48 43 49  BILITOT 1.0  --   --  0.9 1.0 1.0 1.2   < > = values in this interval not displayed.   ------------------------------------------------------------------------------------------------------------------ No results for input(s): CHOL, HDL, LDLCALC, TRIG, CHOLHDL, LDLDIRECT in the last 72 hours.  No results found for: HGBA1C ------------------------------------------------------------------------------------------------------------------ No results for input(s): TSH, T4TOTAL, T3FREE, THYROIDAB in the last 72 hours.  Invalid input(s): FREET3 ------------------------------------------------------------------------------------------------------------------ No results for input(s): VITAMINB12, FOLATE, FERRITIN, TIBC, IRON, RETICCTPCT in the last 72 hours.  Coagulation profile No results for input(s): INR, PROTIME in the last 168 hours.  Recent Labs    01/30/20 0500 01/31/20 0310  DDIMER 8.51* 10.68*    Cardiac Enzymes No results for input(s): CKMB, TROPONINI, MYOGLOBIN in the last 168 hours.  Invalid input(s):  CK ------------------------------------------------------------------------------------------------------------------    Component Value Date/Time   BNP 286.6 (H) 01/31/2020 0310    Micro Results Recent Results (from the past 240 hour(s))  Respiratory Panel by RT PCR (Flu A&B, Covid) - Nasopharyngeal Swab     Status: Abnormal   Collection Time: 01/23/20  1:06 PM   Specimen: Nasopharyngeal Swab  Result Value Ref Range Status   SARS Coronavirus 2 by RT PCR POSITIVE (A) NEGATIVE Final    Comment: RESULT CALLED TO, READ BACK BY AND VERIFIED WITH: P. PULLIAM, RN AT 0233 ON 01/23/20 BY C. JESSUP, MT. (NOTE) SARS-CoV-2 target nucleic acids are DETECTED. SARS-CoV-2 RNA is generally detectable in upper respiratory specimens  during the acute phase of infection. Positive results are indicative of the presence of the identified virus, but do not rule out bacterial infection or co-infection with other pathogens not detected by the test. Clinical correlation with patient history and other diagnostic information is necessary to determine patient infection status. The expected result is Negative. Fact Sheet for Patients:  PinkCheek.be Fact Sheet for Healthcare Providers: GravelBags.it This test is not yet approved or cleared by the [place] FDA and  has been authorized for detection and/or diagnosis of SARS-CoV-2 by FDA under an Emergency Use Authorization (EUA).  This EUA will remain in effect (meaning this tes t can be used) for the duration of  the COVID-19 declaration under Section 564(b)(1) of the Act, 21 U.S.C. section 360bbb-3(b)(1), unless the authorization is terminated or revoked sooner.    Influenza A by PCR NEGATIVE NEGATIVE Final   Influenza B by PCR NEGATIVE NEGATIVE Final    Comment: (NOTE) The Xpert Xpress SARS-CoV-2/FLU/RSV assay is intended as an aid in  the diagnosis of influenza from Nasopharyngeal swab  specimens and  should not be used as a sole basis for treatment. Nasal washings and  aspirates are unacceptable for Xpert Xpress SARS-CoV-2/FLU/RSV  testing. Fact Sheet for Patients: PinkCheek.be Fact Sheet for Healthcare Providers: GravelBags.it This test is not yet approved or cleared by the [place] FDA and  has been authorized for detection and/or diagnosis of SARS-CoV-2 by  FDA under  an Emergency Use Authorization (EUA). This EUA will remain  in effect (meaning this test can be used) for the duration of the  Covid-19 declaration under Section 564(b)(1) of the Act, 21  U.S.C. section 360bbb-3(b)(1), unless the authorization is  terminated or revoked. Performed at [facility], [place] [address] [place], [place] [address]   Culture, blood (routine x 2)     Status: None   Collection Time: 01/23/20  1:21 PM   Specimen: BLOOD RIGHT ARM  Result Value Ref Range Status   Specimen Description BLOOD RIGHT ARM  Final   Special Requests   Final    BOTTLES DRAWN AEROBIC ONLY Blood Culture results may not be optimal due to an inadequate volume of blood received in culture bottles   Culture   Final    NO GROWTH 5 DAYS Performed at [facility], [place] [address], [place], [place] [address]    Report Status 01/28/2020 FINAL  Final  Culture, blood (routine x 2)     Status: None   Collection Time: 01/23/20  1:47 PM   Specimen: BLOOD RIGHT FOREARM  Result Value Ref Range Status   Specimen Description BLOOD RIGHT FOREARM  Final   Special Requests   Final    BOTTLES DRAWN AEROBIC AND ANAEROBIC Blood Culture results may not be optimal due to an inadequate volume of blood received in culture bottles   Culture   Final    NO GROWTH 5 DAYS Performed at [facility], [place] [address], [place], [place] [address]    Report Status 01/28/2020 FINAL  Final    Radiology Reports DG Abd 1 View  Result Date: 01/23/2020 CLINICAL  DATA:  Check feeding catheter placement EXAM: ABDOMEN - 1 VIEW COMPARISON:  None. FINDINGS: Gastric catheter is noted with the tip in the distal stomach. Scattered large and small bowel gas is noted. IMPRESSION: Gastric catheter within the distal stomach. Electronically Signed   By: Inez Catalina M.D.   On: 01/23/2020 16:38   CT Head Wo Contrast  Result Date: 01/23/2020 CLINICAL DATA:  Encephalopathy, unresponsive EXAM: CT HEAD WITHOUT CONTRAST TECHNIQUE: Contiguous axial images were obtained from the base of the skull through the vertex without intravenous contrast. COMPARISON:  07/30/2010 FINDINGS: Brain: Confluent hypodensities throughout the periventricular white matter are compatible with age-indeterminate small vessel ischemic changes, favor chronic. No other signs of acute infarct or hemorrhage. Lateral ventricles and remaining midline structures are unremarkable. No acute extra-axial fluid collections. No mass effect. Vascular: No hyperdense vessel or unexpected calcification. Skull: Normal. Negative for fracture or focal lesion. Sinuses/Orbits: Mucoperiosteal thickening is seen within the bilateral ethmoid air cells. Other: None IMPRESSION: 1. Likely chronic small-vessel ischemic changes throughout the white matter. 2. No acute intracranial process otherwise. Electronically Signed   By: Randa Ngo M.D.   On: 01/23/2020 15:02   DG Chest Port 1 View  Result Date: 01/28/2020 CLINICAL DATA:  Shortness of breath. COPD. Additional history provided by technologist: Wheezing, COVID positive. EXAM: PORTABLE CHEST 1 VIEW COMPARISON:  Chest radiograph 01/23/2020 FINDINGS: Interval extubation.  Overlying cardiac monitoring leads. Unchanged cardiomegaly.  Aortic atherosclerosis. There has been significant interval improvement of apical predominant bilateral pulmonary opacities. Minimal linear atelectasis at the right lung base no evidence of pleural effusion or pneumothorax. No acute bony abnormality.  IMPRESSION: Interval extubation. Significant interval improvement of apical predominant bilateral pulmonary opacities. Findings likely reflect sequela of pneumonia given provided history. Minimal linear atelectasis at the right lung base. Electronically  Signed   By: Kellie Simmering DO   On: 01/28/2020 09:25   DG Chest Portable 1 View  Result Date: 01/23/2020 CLINICAL DATA:  Respiratory failure EXAM: PORTABLE CHEST 1 VIEW COMPARISON:  June 24, 2019 FINDINGS: Endotracheal tube is identified distal tip 6.3 cm from carina. Increased pulmonary interstitium is identified in the bilateral upper lobes and right mid lung. There is no pleural effusion. The aorta is tortuous. The heart size is upper limits are normal. A feeding tube is identified with distal tip not included on film. IMPRESSION: Increased pulmonary interstitium in the bilateral upper lobes and right mid lung, findings are suspicious for developing pneumonias. Electronically Signed   By: Abelardo Diesel M.D.   On: 01/23/2020 13:59   VAS [place] LOWER EXTREMITY VENOUS (DVT)  Result Date: 01/28/2020  Lower Venous DVTStudy Indications: Elevated Ddimer.  Risk Factors: COVID 19 positive. Comparison Study: No prior studies. Performing Technologist: Oliver Hum RVT  Examination Guidelines: A complete evaluation includes B-mode imaging, spectral Doppler, color Doppler, and power Doppler as needed of all accessible portions of each vessel. Bilateral testing is considered an integral part of a complete examination. Limited examinations for reoccurring indications may be performed as noted. The reflux portion of the exam is performed with the patient in reverse Trendelenburg.  +---------+---------------+---------+-----------+----------+--------------+ RIGHT    CompressibilityPhasicitySpontaneityPropertiesThrombus Aging +---------+---------------+---------+-----------+----------+--------------+ CFV      Full           Yes      Yes                                  +---------+---------------+---------+-----------+----------+--------------+ SFJ      Full                                                        +---------+---------------+---------+-----------+----------+--------------+ FV Prox  Full                                                        +---------+---------------+---------+-----------+----------+--------------+ FV Mid   Full                                                        +---------+---------------+---------+-----------+----------+--------------+ FV DistalFull                                                        +---------+---------------+---------+-----------+----------+--------------+ PFV      Full                                                        +---------+---------------+---------+-----------+----------+--------------+ POP      Full  Yes      Yes                                 +---------+---------------+---------+-----------+----------+--------------+ PTV      Full                                                        +---------+---------------+---------+-----------+----------+--------------+ PERO     Full                                                        +---------+---------------+---------+-----------+----------+--------------+   +---------+---------------+---------+-----------+----------+--------------+ LEFT     CompressibilityPhasicitySpontaneityPropertiesThrombus Aging +---------+---------------+---------+-----------+----------+--------------+ CFV      Full           Yes      Yes                                 +---------+---------------+---------+-----------+----------+--------------+ SFJ      Full                                                        +---------+---------------+---------+-----------+----------+--------------+ FV Prox  Full                                                         +---------+---------------+---------+-----------+----------+--------------+ FV Mid   Full                                                        +---------+---------------+---------+-----------+----------+--------------+ FV DistalFull                                                        +---------+---------------+---------+-----------+----------+--------------+ PFV      Full                                                        +---------+---------------+---------+-----------+----------+--------------+ POP      Full           Yes      Yes                                 +---------+---------------+---------+-----------+----------+--------------+ PTV  Full                                                        +---------+---------------+---------+-----------+----------+--------------+ PERO     Full                                                        +---------+---------------+---------+-----------+----------+--------------+     Summary: RIGHT: - There is no evidence of deep vein thrombosis in the lower extremity.  - No cystic structure found in the popliteal fossa.  LEFT: - There is no evidence of deep vein thrombosis in the lower extremity.  - No cystic structure found in the popliteal fossa.  *See table(s) above for measurements and observations. Electronically signed by Deitra Mayo MD on 01/28/2020 at 10:15:57 AM.    Final    ECHOCARDIOGRAM LIMITED  Result Date: 01/23/2020    ECHOCARDIOGRAM REPORT   Patient Name:   ALBEN JEPSEN Date of Exam: 01/23/2020 Medical Rec #:  [id number]       Height:       71.0 in Accession #:    [id number]      Weight: Date of Birth:  [birth year]       BSA: Patient Age:    52 years        BP:           99/74 mmHg Patient Gender: M               HR:           62 bpm. Exam Location:  Inpatient Procedure: Limited Echo, Cardiac Doppler and Limited Color Doppler Indications:    Cardiomegaly 429.3  History:        Patient has no  prior history of Echocardiogram examinations.                 CHF, CAD; COPD and Covid.  Sonographer:    Johny Chess Referring Phys: [id number] [place]  1. Left ventricular ejection fraction, by estimation, is 35 to 40%. The left ventricle has moderately decreased function. The left ventricle demonstrates regional wall motion abnormalities (see scoring diagram/findings for description). The left ventricular internal cavity size was mildly dilated. Left ventricular diastolic parameters are consistent with Grade I diastolic dysfunction (impaired relaxation). There is moderate hypokinesis of the left ventricular, mid-apical anteroseptal wall, anterior wall and apical segment.  2. Right ventricular systolic function is mildly reduced. The right ventricular size is mildly enlarged. There is mildly elevated pulmonary artery systolic pressure.  3. Left atrial size was mildly dilated.  4. The mitral valve is normal in structure and function. No evidence of mitral valve regurgitation.  5. The aortic valve is tricuspid. Aortic valve regurgitation is not visualized. Mild to moderate aortic valve sclerosis/calcification is present, without any evidence of aortic stenosis.  6. The inferior vena cava is dilated in size with <50% respiratory variability, suggesting right atrial pressure of 15 mmHg. This is a nonspecific finding during positive pressure ventilation. FINDINGS  Left Ventricle: Left ventricular ejection fraction, by estimation, is 35 to 40%. The left ventricle has moderately decreased function. The left ventricle demonstrates regional wall motion abnormalities.  Moderate hypokinesis of the left ventricular, mid-apical anteroseptal wall, anterior wall and apical segment. The left ventricular internal cavity size was mildly dilated. There is no left ventricular hypertrophy. Left ventricular diastolic parameters are consistent with Grade I diastolic dysfunction (impaired relaxation). Normal left  ventricular filling pressure.  LV Wall Scoring: The mid and distal anterior wall, mid anteroseptal segment, and apex are hypokinetic. Right Ventricle: The right ventricular size is mildly enlarged. No increase in right ventricular wall thickness. Right ventricular systolic function is mildly reduced. There is mildly elevated pulmonary artery systolic pressure. The tricuspid regurgitant  velocity is 1.94 m/s, and with an assumed right atrial pressure of 15 mmHg, the estimated right ventricular systolic pressure is 35.3 mmHg. Left Atrium: Left atrial size was mildly dilated. Right Atrium: Right atrial size was not well visualized. Pericardium: There is no evidence of pericardial effusion. Mitral Valve: The mitral valve is normal in structure and function. No evidence of mitral valve regurgitation. Tricuspid Valve: The tricuspid valve is normal in structure. Tricuspid valve regurgitation is not demonstrated. Aortic Valve: The aortic valve is tricuspid. Aortic valve regurgitation is not visualized. Mild to moderate aortic valve sclerosis/calcification is present, without any evidence of aortic stenosis. Pulmonic Valve: The pulmonic valve was not well visualized. Pulmonic valve regurgitation is not visualized. Aorta: The aortic root is normal in size and structure. Venous: The inferior vena cava is dilated in size with less than 50% respiratory variability, suggesting right atrial pressure of 15 mmHg. IAS/Shunts: No atrial level shunt detected by color flow Doppler.  LEFT VENTRICLE PLAX 2D LVIDd:         5.90 cm  Diastology LVIDs:         4.80 cm  LV e' lateral:   4.13 cm/s LV PW:         0.80 cm  LV E/e' lateral: 11.0 LV IVS:        1.10 cm  LV e' medial:    4.13 cm/s LVOT diam:     2.20 cm  LV E/e' medial:  11.0 LVOT Area:     3.80 cm  LEFT ATRIUM LA diam:    3.40 cm   AORTA Ao Root diam: 3.60 cm MITRAL VALVE               TRICUSPID VALVE MV Area (PHT): 3.60 cm    TR Peak grad:   15.1 mmHg MV Decel Time: 211 msec     TR Vmax:        194.00 cm/s MV E velocity: 45.40 cm/s MV A velocity: 87.40 cm/s  SHUNTS MV E/A ratio:  0.52        Systemic Diam: 2.20 cm Dani Gobble Croitoru MD Electronically signed by Sanda Klein MD Signature Date/Time: 01/23/2020/5:37:49 PM    Final     Time Spent in minutes  30   Lala Lund M.D on 01/31/2020 at 10:47 AM  To page go to [email or web address] - password [facility]

## 2020-01-31 NOTE — Progress Notes (Signed)
Pharmacy  - VTE Prophylaxis  Pharmacy consulted for intermediate dosed Lovenox for VTE prophylaxis. Patient previously had SCDs ordered due to hematuria.   CBC is stable. D-dimer trending up, now 10.68. SCr is 1.1 and CrCl ~60 ml/min. BMI <35.  Begin Lovenox 40 mg SQ q12h Monitor CBC  Thank you for involving pharmacy in this patient's care.  Loura Back, PharmD, BCPS Clinical Pharmacist Clinical phone for 01/31/2020 until 3p is [phone] 01/31/2020 8:17 AM  **Pharmacist phone directory can be found on [email or web address] listed under [facility]**

## 2020-02-01 DIAGNOSIS — Z955 Presence of coronary angioplasty implant and graft: Secondary | ICD-10-CM | POA: Diagnosis not present

## 2020-02-01 DIAGNOSIS — M6259 Muscle wasting and atrophy, not elsewhere classified, multiple sites: Secondary | ICD-10-CM | POA: Diagnosis not present

## 2020-02-01 DIAGNOSIS — R5381 Other malaise: Secondary | ICD-10-CM | POA: Diagnosis not present

## 2020-02-01 DIAGNOSIS — R2681 Unsteadiness on feet: Secondary | ICD-10-CM | POA: Diagnosis not present

## 2020-02-01 DIAGNOSIS — M6281 Muscle weakness (generalized): Secondary | ICD-10-CM | POA: Diagnosis not present

## 2020-02-01 DIAGNOSIS — J9601 Acute respiratory failure with hypoxia: Secondary | ICD-10-CM | POA: Diagnosis not present

## 2020-02-01 DIAGNOSIS — I5042 Chronic combined systolic (congestive) and diastolic (congestive) heart failure: Secondary | ICD-10-CM | POA: Diagnosis present

## 2020-02-01 DIAGNOSIS — I4510 Unspecified right bundle-branch block: Secondary | ICD-10-CM | POA: Diagnosis present

## 2020-02-01 DIAGNOSIS — A419 Sepsis, unspecified organism: Secondary | ICD-10-CM | POA: Diagnosis not present

## 2020-02-01 DIAGNOSIS — R6521 Severe sepsis with septic shock: Secondary | ICD-10-CM | POA: Diagnosis not present

## 2020-02-01 DIAGNOSIS — I252 Old myocardial infarction: Secondary | ICD-10-CM | POA: Diagnosis not present

## 2020-02-01 DIAGNOSIS — J440 Chronic obstructive pulmonary disease with acute lower respiratory infection: Secondary | ICD-10-CM | POA: Diagnosis present

## 2020-02-01 DIAGNOSIS — R278 Other lack of coordination: Secondary | ICD-10-CM | POA: Diagnosis not present

## 2020-02-01 DIAGNOSIS — Z9049 Acquired absence of other specified parts of digestive tract: Secondary | ICD-10-CM | POA: Diagnosis not present

## 2020-02-01 DIAGNOSIS — M2550 Pain in unspecified joint: Secondary | ICD-10-CM | POA: Diagnosis not present

## 2020-02-01 DIAGNOSIS — Z803 Family history of malignant neoplasm of breast: Secondary | ICD-10-CM | POA: Diagnosis not present

## 2020-02-01 DIAGNOSIS — J449 Chronic obstructive pulmonary disease, unspecified: Secondary | ICD-10-CM | POA: Diagnosis not present

## 2020-02-01 DIAGNOSIS — R1312 Dysphagia, oropharyngeal phase: Secondary | ICD-10-CM | POA: Diagnosis not present

## 2020-02-01 DIAGNOSIS — Z8249 Family history of ischemic heart disease and other diseases of the circulatory system: Secondary | ICD-10-CM | POA: Diagnosis not present

## 2020-02-01 DIAGNOSIS — Z7901 Long term (current) use of anticoagulants: Secondary | ICD-10-CM | POA: Diagnosis not present

## 2020-02-01 DIAGNOSIS — J1282 Pneumonia due to coronavirus disease 2019: Secondary | ICD-10-CM | POA: Diagnosis present

## 2020-02-01 DIAGNOSIS — Z96651 Presence of right artificial knee joint: Secondary | ICD-10-CM | POA: Diagnosis present

## 2020-02-01 DIAGNOSIS — Z66 Do not resuscitate: Secondary | ICD-10-CM | POA: Diagnosis present

## 2020-02-01 DIAGNOSIS — Z7189 Other specified counseling: Secondary | ICD-10-CM | POA: Diagnosis not present

## 2020-02-01 DIAGNOSIS — I350 Nonrheumatic aortic (valve) stenosis: Secondary | ICD-10-CM | POA: Diagnosis present

## 2020-02-01 DIAGNOSIS — I2510 Atherosclerotic heart disease of native coronary artery without angina pectoris: Secondary | ICD-10-CM | POA: Diagnosis present

## 2020-02-01 DIAGNOSIS — R4182 Altered mental status, unspecified: Secondary | ICD-10-CM | POA: Diagnosis not present

## 2020-02-01 DIAGNOSIS — R0689 Other abnormalities of breathing: Secondary | ICD-10-CM | POA: Diagnosis not present

## 2020-02-01 DIAGNOSIS — U071 COVID-19: Secondary | ICD-10-CM | POA: Diagnosis present

## 2020-02-01 DIAGNOSIS — J9621 Acute and chronic respiratory failure with hypoxia: Secondary | ICD-10-CM | POA: Diagnosis present

## 2020-02-01 DIAGNOSIS — E785 Hyperlipidemia, unspecified: Secondary | ICD-10-CM | POA: Diagnosis present

## 2020-02-01 DIAGNOSIS — Z87891 Personal history of nicotine dependence: Secondary | ICD-10-CM | POA: Diagnosis not present

## 2020-02-01 DIAGNOSIS — R404 Transient alteration of awareness: Secondary | ICD-10-CM | POA: Diagnosis not present

## 2020-02-01 DIAGNOSIS — R0902 Hypoxemia: Secondary | ICD-10-CM | POA: Diagnosis not present

## 2020-02-01 DIAGNOSIS — N183 Chronic kidney disease, stage 3 unspecified: Secondary | ICD-10-CM | POA: Diagnosis present

## 2020-02-01 DIAGNOSIS — R2689 Other abnormalities of gait and mobility: Secondary | ICD-10-CM | POA: Diagnosis not present

## 2020-02-01 DIAGNOSIS — K219 Gastro-esophageal reflux disease without esophagitis: Secondary | ICD-10-CM | POA: Diagnosis present

## 2020-02-01 DIAGNOSIS — I255 Ischemic cardiomyopathy: Secondary | ICD-10-CM | POA: Diagnosis present

## 2020-02-01 DIAGNOSIS — Z800 Family history of malignant neoplasm of digestive organs: Secondary | ICD-10-CM | POA: Diagnosis not present

## 2020-02-01 DIAGNOSIS — R41841 Cognitive communication deficit: Secondary | ICD-10-CM | POA: Diagnosis not present

## 2020-02-01 DIAGNOSIS — I130 Hypertensive heart and chronic kidney disease with heart failure and stage 1 through stage 4 chronic kidney disease, or unspecified chronic kidney disease: Secondary | ICD-10-CM | POA: Diagnosis present

## 2020-02-01 DIAGNOSIS — Z515 Encounter for palliative care: Secondary | ICD-10-CM | POA: Diagnosis present

## 2020-02-01 DIAGNOSIS — Z7401 Bed confinement status: Secondary | ICD-10-CM | POA: Diagnosis not present

## 2020-02-01 LAB — D-DIMER, QUANTITATIVE: D-Dimer, Quant: 10.82 ug/mL-FEU — ABNORMAL HIGH (ref 0.00–0.50)

## 2020-02-01 MED ORDER — PANTOPRAZOLE SODIUM 40 MG PO TBEC: 40.0000 mg | DELAYED_RELEASE_TABLET | Freq: Every day | ORAL | 0 refills | Status: AC

## 2020-02-01 MED ORDER — APIXABAN 2.5 MG PO TABS: 2.5000 mg | ORAL_TABLET | Freq: Two times a day (BID) | ORAL | Status: AC

## 2020-02-01 MED ORDER — FUROSEMIDE 10 MG/ML IJ SOLN
80.0000 mg | Freq: Once | INTRAMUSCULAR | Status: AC
  Administered 2020-02-01: 80 mg via INTRAVENOUS
  Filled 2020-02-01: qty 8

## 2020-02-01 MED ORDER — PREDNISONE 5 MG PO TABS: 10.0000 mg | ORAL_TABLET | Freq: Every day | ORAL | 0 refills | Status: AC

## 2020-02-01 MED ORDER — METHYLPREDNISOLONE SODIUM SUCC 40 MG IJ SOLR
30.0000 mg | Freq: Every day | INTRAMUSCULAR | Status: DC
  Administered 2020-02-01: 30 mg via INTRAVENOUS
  Filled 2020-02-01: qty 1

## 2020-02-01 MED ORDER — SPIRONOLACTONE 25 MG PO TABS: 25.0000 mg | ORAL_TABLET | Freq: Every day | ORAL | Status: AC

## 2020-02-01 MED ORDER — ISOSORB DINITRATE-HYDRALAZINE 20-37.5 MG PO TABS: 2.0000 | ORAL_TABLET | Freq: Three times a day (TID) | ORAL | Status: AC

## 2020-02-01 MED ORDER — ASPIRIN EC 81 MG PO TBEC: 81.0000 mg | DELAYED_RELEASE_TABLET | Freq: Every day | ORAL | Status: AC

## 2020-02-01 MED ORDER — ALBUTEROL SULFATE HFA 108 (90 BASE) MCG/ACT IN AERS: 2.0000 | INHALATION_SPRAY | Freq: Three times a day (TID) | RESPIRATORY_TRACT | Status: DC

## 2020-02-01 MED ORDER — FUROSEMIDE 80 MG PO TABS: 80.0000 mg | ORAL_TABLET | Freq: Every day | ORAL | Status: DC

## 2020-02-01 MED ORDER — FUROSEMIDE 80 MG PO TABS: 80.0000 mg | ORAL_TABLET | Freq: Every day | ORAL | Status: AC

## 2020-02-01 NOTE — TOC Transition Note (Signed)
Transition of Care [facility]) - CM/SW Discharge Note   Patient Details  Name: Jon Baker MRN: [id number] Date of Birth: [birth year]  [facility] CM/SW Contact:  Mearl Latin, LCSW Phone Number: 02/01/2020, 1:35 PM   Clinical Narrative:    Patient will DC to: [facility] Anticipated DC date: 02/01/20 Family notified: Daughter, Revonda Standard Transport by: Sharin Mons   Per MD patient ready for DC to [place]. RN, patient, patient's family, and facility notified of DC. Discharge Summary and FL2 sent to facility. RN to call report prior to discharge [phone] Room 706P). DC packet on chart. Ambulance transport requested for patient.   CSW will sign off for now as social work intervention is no longer needed. Please consult [place] again if new needs arise.  Cristobal Goldmann, LCSW Clinical Social Worker [phone]    Final next level of care: Skilled Nursing Facility Barriers to Discharge: No Barriers Identified   Patient Goals and CMS Choice Patient states their goals for this hospitalization and ongoing recovery are:: For patient to become stronger and return home CMS Medicare.gov Compare Post Acute Care list provided to:: Patient Represenative (must comment)(Daughter, Revonda Standard) Choice offered to / list presented to : Adult Children  Discharge Placement   Existing PASRR number confirmed : 02/01/20          Patient chooses bed at: [facility] Patient to be transferred to facility by: [facility] Name of family member notified: Daughter, Revonda Standard Patient and family notified of of transfer: 02/01/20  Discharge Plan and Services In-house Referral: Clinical Social Work   Post Acute Care Choice: (Uncertain at this time)                               Social Determinants of Health (SDOH) Interventions     Readmission Risk Interventions No flowsheet data found.

## 2020-02-01 NOTE — Discharge Summary (Signed)
Jon Baker XKG:[id number] DOB: [birth year] DOA: 01/23/2020  PCP: Jon Anger, MD  Admit date: 01/23/2020  Discharge date: 02/01/2020  Admitted From: Home   Disposition:  SNF   Recommendations for Outpatient Follow-up:   Follow up with PCP in 1-2 weeks  PCP Please obtain BMP/CBC, 2 view CXR in 1week,  (see Discharge instructions)   PCP Please follow up on the following pending results: Stop Eliquis start February 14, 2020 and start aspirin 81 mg on March 9.  Please make sure patient uses his albuterol inhaler scheduled every 4 hours, monitor BMP and diuretic dose closely.   Home Health: None  Equipment/Devices: None  Consultations: None  Discharge Condition: Stable    CODE STATUS: DNR   Diet Recommendation: Heart Healthy with strict 1.5 L/day total fluid restriction.  CC - SOB   Brief history of present illness from the day of admission and additional interim summary     86 yo male smoker brought to ER unresponsive and had fever 102.7. Intubated in ER and required pressors. Found to have COVID 19 pneumonia. 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  Significant Hospital Events   2/14 SARS Cov2 >> positive 2/14 Flu A/B >> both negative Blood 2/14 >> 2/14 Admit 2/16 extubated                                                                 Hospital Course    1.  Acute on chronic hypoxic respiratory failure in a patient with advanced COPD who uses 3 L of nasal cannula oxygen at home along with ILD. This was due to combination of COVID-19 pneumonia and now looks to have some element of acute on chronic systolic CHF with EF 70% superimposed on his advanced COPD.  He needed to be intubated, was  extubated on the 17th of this month, was under the care of ICU and transferred out of ICU on 01/27/2020 under my care.  He has finished his Covid treatment, he is now down to his 10 mg daily prednisone dose which he takes at home for a long time prescribed by his PCP, he has been adequately diuresed with IV Lasix and has been transitioned to oral Lasix and Aldactone combination, he is now down to 3 L nasal cannula oxygen which he uses at home and will be discharged to SNF on it.  [facility] MD to kindly monitor his diuretic dose and BMP closely, please make sure patient uses scheduled albuterol inhalers every 4-6 hours and then as needed as needed.  He develops wheezing if he does not use his albuterol inhalers on a regular basis.      SpO2: 99 % O2 Flow Rate (  L/min): 3 L/min FiO2 (%): 40 %  Recent Labs  Lab 01/26/20 0310 01/27/20 0802 01/27/20 0802 01/28/20 0713 01/29/20 0542 01/30/20 0500 01/31/20 0310 02/01/20 0615  CRP 3.0*  --   --  0.8 0.8 0.6 0.6  --   DDIMER  --  5.89*   < > 7.43* 8.14* 8.51* 10.68* 10.82*  BNP  --  567.4*  --  349.6* 301.5* 453.0* 286.6*  --   PROCALCITON  --  1.65  --  0.85 0.34  --   --   --    < > = values in this interval not displayed.    Hepatic Function Latest Ref Rng & Units 01/31/2020 01/30/2020 01/29/2020  Total Protein 6.5 - 8.1 g/dL 4.8(L) 4.8(L) 5.0(L)  Albumin 3.5 - 5.0 g/dL 2.7(L) 2.6(L) 2.8(L)  AST 15 - 41 U/L 50(H) 58(H) 66(H)  ALT 0 - 44 U/L 83(H) 85(H) 101(H)  Alk Phosphatase 38 - 126 U/L 49 43 48  Total Bilirubin 0.3 - 1.2 mg/dL 1.2 1.0 1.0     2.  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  On 3 L nasal cannula oxygen at home, oxygen supplementation as needed, continue supportive care, see above.  Tapering IV steroids with caution.  Note he is on long-term oral steroids.  3.  Acute on chronic systolic CHF.  EF 35 to 40%.  Also had demand mismatch related non-ACS pattern troponin rise.  Placed on IV Lasix as  tolerated by his blood pressure and renal function, aspirin, Coreg and Lipitor.  Chest pain-free.  Echo noted.  Commend outpatient cardiology follow-up in 1 to 2 weeks post discharge.  4.  Acute metabolic encephalopathy secondary to hypoxia.  Improved.  5.  Hematuria.  Majority has resolved, Foley removed.  6.  Elevated D-dimer.   Was not on chemical prophylaxis due to hematuria and ICU, hematuria resolved, Foley has been removed, lower extremity venous duplex negative, since his D-dimer is still quite high will place him on Eliquis prophylactic dose for 2 weeks, after 2 weeks resume home dose aspirin and stop Eliquis.  Continue Plavix for now.  PPI also added.  7.  CKD 3.  Follow be made.  8.  Deconditioning.  PT OT advance activity.  9. HTN - stable on Coreg, BiDil and diuretics.  10.  AKI .  Baseline creatinine is around 1, creatinine has come down to baseline after diuresis.     Discharge diagnosis     Active Problems:   Pneumonia   Pressure injury of skin   Acute respiratory failure (HCC)   Pneumonia due to COVID-19 virus   Chronic hypoxemic respiratory failure [facility])    Discharge instructions    Discharge Instructions    Discharge instructions   Complete by: As directed    Follow with Primary MD [name], [name], MD in 7 days   Get CBC, CMP, 2 view Chest X ray -  checked next visit within 1 week by Primary MD or SNF MD   Activity: As tolerated with Full fall precautions use walker/cane & assistance as needed  Disposition SNF  Diet: Heart Healthy  -  Check your Weight same time everyday, if you gain over 2 pounds, or you develop in leg swelling, experience more shortness of breath or chest pain, call your Primary MD immediately. Follow Cardiac Low Salt Diet and 1.5 lit/day fluid restriction.  Special Instructions: If you have smoked or chewed Tobacco  in the last 2 yrs please stop  smoking, stop any regular Alcohol  and or any Recreational drug use.  On  your next visit with your primary care physician please Get Medicines reviewed and adjusted.  Please request your Prim.MD to go over all Hospital Tests and Procedure/Radiological results at the follow up, please get all Hospital records sent to your Prim MD by signing hospital release before you go home.  If you experience worsening of your admission symptoms, develop shortness of breath, life threatening emergency, suicidal or homicidal thoughts you must seek medical attention immediately by calling 911 or calling your MD immediately  if symptoms less severe.  You Must read complete instructions/literature along with all the possible adverse reactions/side effects for all the Medicines you take and that have been prescribed to you. Take any new Medicines after you have completely understood and accpet all the possible adverse reactions/side effects.   Increase activity slowly   Complete by: As directed    MyChart COVID-19 home monitoring program   Complete by: Feb 01, 2020    Is the patient willing to use the [place] for home monitoring?: No      Discharge Medications   Allergies as of 02/01/2020   No Known Allergies     Medication List    TAKE these medications   albuterol 108 (90 Base) MCG/ACT inhaler Commonly known as: VENTOLIN HFA Inhale 2 puffs into the lungs every 6 (six) hours as needed for wheezing or shortness of breath. What changed: Another medication with the same name was added. Make sure you understand how and when to take each.   albuterol 108 (90 Base) MCG/ACT inhaler Commonly known as: VENTOLIN HFA Inhale 2 puffs into the lungs in the morning, at noon, and at bedtime. What changed: You were already taking a medication with the same name, and this prescription was added. Make sure you understand how and when to take each.   apixaban 2.5 MG Tabs tablet Commonly known as: Eliquis Take 1 tablet (2.5 mg total) by mouth 2 (two) times daily for 13 days.     aspirin EC 81 MG tablet Take 1 tablet (81 mg total) by mouth daily. Start taking on: February 15, 2020 What changed: These instructions start on February 15, 2020. If you are unsure what to do until then, ask your doctor or other care provider.   atorvastatin 80 MG tablet Commonly known as: LIPITOR Take 80 mg by mouth at bedtime.   budesonide 0.25 MG/2ML nebulizer solution Commonly known as: PULMICORT Take 0.25 mg by nebulization 2 (two) times daily.   carvedilol 25 MG tablet Commonly known as: COREG Take 25 mg by mouth 2 (two) times daily.   cholecalciferol 25 MCG (1000 UNIT) tablet Commonly known as: VITAMIN D3 Take 1,000 Units by mouth daily.   clopidogrel 75 MG tablet Commonly known as: PLAVIX Take 75 mg by mouth daily at 6 PM.   furosemide 80 MG tablet Commonly known as: LASIX Take 1 tablet (80 mg total) by mouth daily. Start taking on: February 02, 2020   guaiFENesin 600 MG 12 hr tablet Commonly known as: MUCINEX Take 600 mg by mouth daily.   ipratropium-albuterol 0.5-2.5 (3) MG/3ML Soln Commonly known as: DUONEB Take 3 mLs by nebulization every 6 (six) hours as needed (shortness of breath/wheezing).   isosorbide-hydrALAZINE 20-37.5 MG tablet Commonly known as: BIDIL Take 2 tablets by mouth 3 (three) times daily.   NYQUIL PO Take 30 mLs by mouth at bedtime.   pantoprazole 40 MG tablet Commonly  known as: Protonix Take 1 tablet (40 mg total) by mouth daily.   predniSONE 5 MG tablet Commonly known as: DELTASONE Take 2 tablets (10 mg total) by mouth daily with breakfast. Label  & dispense according to the schedule below. take 8 Pills PO for 3 days, 6 Pills PO for 3 days, 4 Pills PO for 3 days, 2 Pills PO for 3 days, 1 Pills PO for 3 days, 1/2 Pill  PO for 3 days then STOP. Total 65 pills.   spironolactone 25 MG tablet Commonly known as: ALDACTONE Take 1 tablet (25 mg total) by mouth daily. Start taking on: February 02, 2020        Contact information for  follow-up providers    [name], [name], MD. Schedule an appointment as soon as possible for a visit in 1 week(s).   Specialty: Internal Medicine Contact information: [place] [address] [phone]            Contact information for after-discharge care    Destination    [facility] .   Service: Skilled Nursing Contact information: [place] [phone]                  Major procedures and Radiology Reports - PLEASE review detailed and final reports thoroughly  -         DG Abd 1 View  Result Date: 01/23/2020 CLINICAL DATA:  Check feeding catheter placement EXAM: ABDOMEN - 1 VIEW COMPARISON:  None. FINDINGS: Gastric catheter is noted with the tip in the distal stomach. Scattered large and small bowel gas is noted. IMPRESSION: Gastric catheter within the distal stomach. Electronically Signed   By: Inez Catalina M.D.   On: 01/23/2020 16:38   CT Head Wo Contrast  Result Date: 01/23/2020 CLINICAL DATA:  Encephalopathy, unresponsive EXAM: CT HEAD WITHOUT CONTRAST TECHNIQUE: Contiguous axial images were obtained from the base of the skull through the vertex without intravenous contrast. COMPARISON:  07/30/2010 FINDINGS: Brain: Confluent hypodensities throughout the periventricular white matter are compatible with age-indeterminate small vessel ischemic changes, favor chronic. No other signs of acute infarct or hemorrhage. Lateral ventricles and remaining midline structures are unremarkable. No acute extra-axial fluid collections. No mass effect. Vascular: No hyperdense vessel or unexpected calcification. Skull: Normal. Negative for fracture or focal lesion. Sinuses/Orbits: Mucoperiosteal thickening is seen within the bilateral ethmoid air cells. Other: None IMPRESSION: 1. Likely chronic small-vessel ischemic changes throughout the white matter. 2. No acute intracranial process otherwise.  Electronically Signed   By: Randa Ngo M.D.   On: 01/23/2020 15:02   DG Chest Port 1 View  Result Date: 01/28/2020 CLINICAL DATA:  Shortness of breath. COPD. Additional history provided by technologist: Wheezing, COVID positive. EXAM: PORTABLE CHEST 1 VIEW COMPARISON:  Chest radiograph 01/23/2020 FINDINGS: Interval extubation.  Overlying cardiac monitoring leads. Unchanged cardiomegaly.  Aortic atherosclerosis. There has been significant interval improvement of apical predominant bilateral pulmonary opacities. Minimal linear atelectasis at the right lung base no evidence of pleural effusion or pneumothorax. No acute bony abnormality. IMPRESSION: Interval extubation. Significant interval improvement of apical predominant bilateral pulmonary opacities. Findings likely reflect sequela of pneumonia given provided history. Minimal linear atelectasis at the right lung base. Electronically Signed   By: Kellie Simmering DO   On: 01/28/2020 09:25   DG Chest Portable 1 View  Result Date: 01/23/2020 CLINICAL DATA:  Respiratory failure EXAM: PORTABLE CHEST 1 VIEW COMPARISON:  June 24, 2019 FINDINGS: Endotracheal  tube is identified distal tip 6.3 cm from carina. Increased pulmonary interstitium is identified in the bilateral upper lobes and right mid lung. There is no pleural effusion. The aorta is tortuous. The heart size is upper limits are normal. A feeding tube is identified with distal tip not included on film. IMPRESSION: Increased pulmonary interstitium in the bilateral upper lobes and right mid lung, findings are suspicious for developing pneumonias. Electronically Signed   By: Abelardo Diesel M.D.   On: 01/23/2020 13:59   VAS [place] LOWER EXTREMITY VENOUS (DVT)  Result Date: 01/28/2020  Lower Venous DVTStudy Indications: Elevated Ddimer.  Risk Factors: COVID 19 positive. Comparison Study: No prior studies. Performing Technologist: Oliver Hum RVT  Examination Guidelines: A complete evaluation includes B-mode  imaging, spectral Doppler, color Doppler, and power Doppler as needed of all accessible portions of each vessel. Bilateral testing is considered an integral part of a complete examination. Limited examinations for reoccurring indications may be performed as noted. The reflux portion of the exam is performed with the patient in reverse Trendelenburg.  +---------+---------------+---------+-----------+----------+--------------+ RIGHT    CompressibilityPhasicitySpontaneityPropertiesThrombus Aging +---------+---------------+---------+-----------+----------+--------------+ CFV      Full           Yes      Yes                                 +---------+---------------+---------+-----------+----------+--------------+ SFJ      Full                                                        +---------+---------------+---------+-----------+----------+--------------+ FV Prox  Full                                                        +---------+---------------+---------+-----------+----------+--------------+ FV Mid   Full                                                        +---------+---------------+---------+-----------+----------+--------------+ FV DistalFull                                                        +---------+---------------+---------+-----------+----------+--------------+ PFV      Full                                                        +---------+---------------+---------+-----------+----------+--------------+ POP      Full           Yes      Yes                                 +---------+---------------+---------+-----------+----------+--------------+  PTV      Full                                                        +---------+---------------+---------+-----------+----------+--------------+ PERO     Full                                                        +---------+---------------+---------+-----------+----------+--------------+    +---------+---------------+---------+-----------+----------+--------------+ LEFT     CompressibilityPhasicitySpontaneityPropertiesThrombus Aging +---------+---------------+---------+-----------+----------+--------------+ CFV      Full           Yes      Yes                                 +---------+---------------+---------+-----------+----------+--------------+ SFJ      Full                                                        +---------+---------------+---------+-----------+----------+--------------+ FV Prox  Full                                                        +---------+---------------+---------+-----------+----------+--------------+ FV Mid   Full                                                        +---------+---------------+---------+-----------+----------+--------------+ FV DistalFull                                                        +---------+---------------+---------+-----------+----------+--------------+ PFV      Full                                                        +---------+---------------+---------+-----------+----------+--------------+ POP      Full           Yes      Yes                                 +---------+---------------+---------+-----------+----------+--------------+ PTV      Full                                                        +---------+---------------+---------+-----------+----------+--------------+  PERO     Full                                                        +---------+---------------+---------+-----------+----------+--------------+     Summary: RIGHT: - There is no evidence of deep vein thrombosis in the lower extremity.  - No cystic structure found in the popliteal fossa.  LEFT: - There is no evidence of deep vein thrombosis in the lower extremity.  - No cystic structure found in the popliteal fossa.  *See table(s) above for measurements and observations. Electronically signed  by Deitra Mayo MD on 01/28/2020 at 10:15:57 AM.    Final    ECHOCARDIOGRAM LIMITED  Result Date: 01/23/2020    ECHOCARDIOGRAM REPORT   Patient Name:   Jon Baker Date of Exam: 01/23/2020 Medical Rec #:  [id number]       Height:       71.0 in Accession #:    [id number]      Weight: Date of Birth:  07-31-1932       BSA: Patient Age:    64 years        BP:           99/74 mmHg Patient Gender: M               HR:           62 bpm. Exam Location:  Inpatient Procedure: Limited Echo, Cardiac Doppler and Limited Color Doppler Indications:    Cardiomegaly 429.3  History:        Patient has no prior history of Echocardiogram examinations.                 CHF, CAD; COPD and Covid.  Sonographer:    Johny Chess Referring Phys: [id number] [place]  1. Left ventricular ejection fraction, by estimation, is 35 to 40%. The left ventricle has moderately decreased function. The left ventricle demonstrates regional wall motion abnormalities (see scoring diagram/findings for description). The left ventricular internal cavity size was mildly dilated. Left ventricular diastolic parameters are consistent with Grade I diastolic dysfunction (impaired relaxation). There is moderate hypokinesis of the left ventricular, mid-apical anteroseptal wall, anterior wall and apical segment.  2. Right ventricular systolic function is mildly reduced. The right ventricular size is mildly enlarged. There is mildly elevated pulmonary artery systolic pressure.  3. Left atrial size was mildly dilated.  4. The mitral valve is normal in structure and function. No evidence of mitral valve regurgitation.  5. The aortic valve is tricuspid. Aortic valve regurgitation is not visualized. Mild to moderate aortic valve sclerosis/calcification is present, without any evidence of aortic stenosis.  6. The inferior vena cava is dilated in size with <50% respiratory variability, suggesting right atrial pressure of 15 mmHg. This is a  nonspecific finding during positive pressure ventilation. FINDINGS  Left Ventricle: Left ventricular ejection fraction, by estimation, is 35 to 40%. The left ventricle has moderately decreased function. The left ventricle demonstrates regional wall motion abnormalities. Moderate hypokinesis of the left ventricular, mid-apical anteroseptal wall, anterior wall and apical segment. The left ventricular internal cavity size was mildly dilated. There is no left ventricular hypertrophy. Left ventricular diastolic parameters are consistent with Grade I diastolic dysfunction (impaired relaxation). Normal left ventricular filling pressure.  LV Wall Scoring: The mid and distal anterior  wall, mid anteroseptal segment, and apex are hypokinetic. Right Ventricle: The right ventricular size is mildly enlarged. No increase in right ventricular wall thickness. Right ventricular systolic function is mildly reduced. There is mildly elevated pulmonary artery systolic pressure. The tricuspid regurgitant  velocity is 1.94 m/s, and with an assumed right atrial pressure of 15 mmHg, the estimated right ventricular systolic pressure is 87.6 mmHg. Left Atrium: Left atrial size was mildly dilated. Right Atrium: Right atrial size was not well visualized. Pericardium: There is no evidence of pericardial effusion. Mitral Valve: The mitral valve is normal in structure and function. No evidence of mitral valve regurgitation. Tricuspid Valve: The tricuspid valve is normal in structure. Tricuspid valve regurgitation is not demonstrated. Aortic Valve: The aortic valve is tricuspid. Aortic valve regurgitation is not visualized. Mild to moderate aortic valve sclerosis/calcification is present, without any evidence of aortic stenosis. Pulmonic Valve: The pulmonic valve was not well visualized. Pulmonic valve regurgitation is not visualized. Aorta: The aortic root is normal in size and structure. Venous: The inferior vena cava is dilated in size with less  than 50% respiratory variability, suggesting right atrial pressure of 15 mmHg. IAS/Shunts: No atrial level shunt detected by color flow Doppler.  LEFT VENTRICLE PLAX 2D LVIDd:         5.90 cm  Diastology LVIDs:         4.80 cm  LV e' lateral:   4.13 cm/s LV PW:         0.80 cm  LV E/e' lateral: 11.0 LV IVS:        1.10 cm  LV e' medial:    4.13 cm/s LVOT diam:     2.20 cm  LV E/e' medial:  11.0 LVOT Area:     3.80 cm  LEFT ATRIUM LA diam:    3.40 cm   AORTA Ao Root diam: 3.60 cm MITRAL VALVE               TRICUSPID VALVE MV Area (PHT): 3.60 cm    TR Peak grad:   15.1 mmHg MV Decel Time: 211 msec    TR Vmax:        194.00 cm/s MV E velocity: 45.40 cm/s MV A velocity: 87.40 cm/s  SHUNTS MV E/A ratio:  0.52        Systemic Diam: 2.20 cm Mihai Croitoru MD Electronically signed by Sanda Klein MD Signature Date/Time: 01/23/2020/5:37:49 PM    Final     Micro Results     Recent Results (from the past 240 hour(s))  Respiratory Panel by RT PCR (Flu A&B, Covid) - Nasopharyngeal Swab     Status: Abnormal   Collection Time: 01/23/20  1:06 PM   Specimen: Nasopharyngeal Swab  Result Value Ref Range Status   SARS Coronavirus 2 by RT PCR POSITIVE (A) NEGATIVE Final    Comment: RESULT CALLED TO, READ BACK BY AND VERIFIED WITH: P. PULLIAM, RN AT 8115 ON 01/23/20 BY C. JESSUP, MT. (NOTE) SARS-CoV-2 target nucleic acids are DETECTED. SARS-CoV-2 RNA is generally detectable in upper respiratory specimens  during the acute phase of infection. Positive results are indicative of the presence of the identified virus, but do not rule out bacterial infection or co-infection with other pathogens not detected by the test. Clinical correlation with patient history and other diagnostic information is necessary to determine patient infection status. The expected result is Negative. Fact Sheet for Patients:  PinkCheek.be Fact Sheet for Healthcare  Providers: GravelBags.it This test is not yet approved  or cleared by the [place] and  has been authorized for detection and/or diagnosis of SARS-CoV-2 by FDA under an Emergency Use Authorization (EUA).  This EUA will remain in effect (meaning this tes t can be used) for the duration of  the COVID-19 declaration under Section 564(b)(1) of the Act, 21 U.S.C. section 360bbb-3(b)(1), unless the authorization is terminated or revoked sooner.    Influenza A by PCR NEGATIVE NEGATIVE Final   Influenza B by PCR NEGATIVE NEGATIVE Final    Comment: (NOTE) The Xpert Xpress SARS-CoV-2/FLU/RSV assay is intended as an aid in  the diagnosis of influenza from Nasopharyngeal swab specimens and  should not be used as a sole basis for treatment. Nasal washings and  aspirates are unacceptable for Xpert Xpress SARS-CoV-2/FLU/RSV  testing. Fact Sheet for Patients: PinkCheek.be Fact Sheet for Healthcare Providers: GravelBags.it This test is not yet approved or cleared by the [place] FDA and  has been authorized for detection and/or diagnosis of SARS-CoV-2 by  FDA under an Emergency Use Authorization (EUA). This EUA will remain  in effect (meaning this test can be used) for the duration of the  Covid-19 declaration under Section 564(b)(1) of the Act, 21  U.S.C. section 360bbb-3(b)(1), unless the authorization is  terminated or revoked. Performed at [facility], [place] [address] [place], [place] [address]   Culture, blood (routine x 2)     Status: None   Collection Time: 01/23/20  1:21 PM   Specimen: BLOOD RIGHT ARM  Result Value Ref Range Status   Specimen Description BLOOD RIGHT ARM  Final   Special Requests   Final    BOTTLES DRAWN AEROBIC ONLY Blood Culture results may not be optimal due to an inadequate volume of blood received in culture bottles   Culture   Final    NO GROWTH 5  DAYS Performed at [facility], [place] [address], [place], [place] [address]    Report Status 01/28/2020 FINAL  Final  Culture, blood (routine x 2)     Status: None   Collection Time: 01/23/20  1:47 PM   Specimen: BLOOD RIGHT FOREARM  Result Value Ref Range Status   Specimen Description BLOOD RIGHT FOREARM  Final   Special Requests   Final    BOTTLES DRAWN AEROBIC AND ANAEROBIC Blood Culture results may not be optimal due to an inadequate volume of blood received in culture bottles   Culture   Final    NO GROWTH 5 DAYS Performed at [facility], [place] [address] [place], [place] [address]    Report Status 01/28/2020 FINAL  Final    Today   Subjective    Lige Lakeman today has no headache,no chest abdominal pain,no new weakness tingling or numbness, feels much better     Objective   Blood pressure 122/88, pulse 69, temperature (!) 97.2 F (36.2 C), temperature source Oral, resp. rate (!) 22, height _0  (1.803 m), weight 104.8 kg, SpO2 99 %.   Intake/Output Summary (Last 24 hours) at 02/01/2020 1141 Last data filed at 02/01/2020 1015 Gross per 24 hour  Intake --  Output 1150 ml  Net -1150 ml    Exam Awake Alert,   No new F.N deficits, Normal affect [place].AT,PERRAL Supple Neck,No JVD, No cervical lymphadenopathy appriciated.  Symmetrical Chest wall movement, Good air movement bilaterally, CTAB RRR,No Gallops,Rubs or new Murmurs, No Parasternal Heave +ve B.Sounds, Abd Soft, Non tender, No organomegaly appriciated, No rebound -guarding or rigidity. No  Cyanosis, Clubbing or edema, No new Rash or bruise   Data Review   CBC w Diff:  Lab Results  Component Value Date   WBC 14.8 (H) 01/31/2020   HGB 11.1 (L) 01/31/2020   HCT 32.8 (L) 01/31/2020   PLT 179 01/31/2020   LYMPHOPCT 2 01/31/2020   MONOPCT 5 01/31/2020   EOSPCT 0 01/31/2020   BASOPCT 0 01/31/2020    CMP:  Lab Results  Component Value Date   NA 138 01/31/2020   K 3.8 01/31/2020   CL 99  01/31/2020   CO2 29 01/31/2020   BUN 37 (H) 01/31/2020   CREATININE 1.10 01/31/2020   PROT 4.8 (L) 01/31/2020   ALBUMIN 2.7 (L) 01/31/2020   BILITOT 1.2 01/31/2020   ALKPHOS 49 01/31/2020   AST 50 (H) 01/31/2020   ALT 83 (H) 01/31/2020  .   Total Time in preparing paper work, data evaluation and todays exam - 45 minutes  Lala Lund M.D on 02/01/2020 at 11:41 AM  [facility]   Office  [phone]

## 2020-02-01 NOTE — Progress Notes (Signed)
Jon Baker to be D/C'd Skilled nursing facility [facility]   per MD order.  Discussed with the patient and all questions fully answered.  VSS, Skin clean, dry and intact without evidence of skin break down, no evidence of skin tears noted. IV catheter discontinued intact. Site without signs and symptoms of complications. Dressing and pressure applied.  An After Visit Summary was printed and given to the patient. Patient received prescription.  D/c education completed with patient/family including follow up instructions, medication list, d/c activities limitations if indicated, with other d/c instructions as indicated by MD - patient able to verbalize understanding, all questions fully answered.   Patient instructed to return to ED, call 911, or call MD for any changes in condition.   Patient escorted via PTAR  and D/C to [facility]. Report given to the nurse brandy  Prior to D/C. [name], Jon Baker and notify of her father D/C.     [name] [facility]   02/01/2020 3:43 PM

## 2020-02-01 NOTE — Discharge Instructions (Signed)
Follow with Primary MD [name], Jon Quint, MD in 7 days   Get CBC, CMP, 2 view Chest X ray -  checked next visit within 1 week by Primary MD or SNF MD   Activity: As tolerated with Full fall precautions use walker/cane & assistance as needed  Disposition SNF  Diet: Heart Healthy  -  Check your Weight same time everyday, if you gain over 2 pounds, or you develop in leg swelling, experience more shortness of breath or chest pain, call your Primary MD immediately. Follow Cardiac Low Salt Diet and 1.5 lit/day fluid restriction.  Special Instructions: If you have smoked or chewed Tobacco  in the last 2 yrs please stop smoking, stop any regular Alcohol  and or any Recreational drug use.  On your next visit with your primary care physician please Get Medicines reviewed and adjusted.  Please request your Prim.MD to go over all Hospital Tests and Procedure/Radiological results at the follow up, please get all Hospital records sent to your Prim MD by signing hospital release before you go home.  If you experience worsening of your admission symptoms, develop shortness of breath, life threatening emergency, suicidal or homicidal thoughts you must seek medical attention immediately by calling 911 or calling your MD immediately  if symptoms less severe.  You Must read complete instructions/literature along with all the possible adverse reactions/side effects for all the Medicines you take and that have been prescribed to you. Take any new Medicines after you have completely understood and accpet all the possible adverse reactions/side effects.

## 2020-02-01 NOTE — Care Management Important Message (Signed)
Important Message  Patient Details  Name: Jon Baker MRN: [id number] Date of Birth: 02/02/1932   Medicare Important Message Given:  Yes - Important Message mailed due to current National Emergency  Verbal consent obtained due to current National Emergency  Relationship to patient: Spouse/Significant Other Contact Name: Justan Gaede Call Date: 02/01/20  Time: 1435 Phone: [phone] Outcome: Spoke with contact Important Message mailed to: Patient address on file    [name] 02/01/2020, 2:35 PM

## 2020-02-02 ENCOUNTER — Encounter: Payer: Self-pay | Admitting: Internal Medicine

## 2020-02-03 ENCOUNTER — Telehealth: Payer: Self-pay | Admitting: *Deleted

## 2020-02-03 DIAGNOSIS — J449 Chronic obstructive pulmonary disease, unspecified: Secondary | ICD-10-CM | POA: Diagnosis not present

## 2020-02-03 DIAGNOSIS — U071 COVID-19: Secondary | ICD-10-CM | POA: Diagnosis not present

## 2020-02-03 DIAGNOSIS — J1282 Pneumonia due to coronavirus disease 2019: Secondary | ICD-10-CM | POA: Diagnosis not present

## 2020-02-03 DIAGNOSIS — J9601 Acute respiratory failure with hypoxia: Secondary | ICD-10-CM | POA: Diagnosis not present

## 2020-02-03 NOTE — Telephone Encounter (Signed)
Pt was on TCM report admitted 01/23/20 he was brought to ER unresponsive and had fever 102.7. Intubated in ER and required pressors. Found to have COVID 19 pneumonia. 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 Pt finished his Covid treatment, he is now down to his 10 mg daily prednisone dose which he takes at home for a long time prescribed by his PCP, he has been adequately diuresed with IV Lasix and has been transitioned to oral Lasix and Aldactone combination, he is now down to 3 L nasal cannula oxygen which he uses at home and discharged 02/01/20 to SNF. Per summary will nee to f/u w/PCP 1-2 wks after leaving SNF.Marland KitchenRaechel Chute

## 2020-02-04 DIAGNOSIS — U071 COVID-19: Secondary | ICD-10-CM | POA: Diagnosis not present

## 2020-02-04 DIAGNOSIS — J1282 Pneumonia due to coronavirus disease 2019: Secondary | ICD-10-CM | POA: Diagnosis not present

## 2020-02-04 DIAGNOSIS — J9601 Acute respiratory failure with hypoxia: Secondary | ICD-10-CM | POA: Diagnosis not present

## 2020-02-04 DIAGNOSIS — J449 Chronic obstructive pulmonary disease, unspecified: Secondary | ICD-10-CM | POA: Diagnosis not present

## 2020-02-05 ENCOUNTER — Inpatient Hospital Stay (HOSPITAL_COMMUNITY)
Admission: EM | Admit: 2020-02-05 | Discharge: 2020-03-09 | DRG: 951 | Disposition: E | Payer: Medicare Other | Source: Skilled Nursing Facility | Attending: Internal Medicine | Admitting: Internal Medicine

## 2020-02-05 ENCOUNTER — Encounter (HOSPITAL_COMMUNITY): Payer: Self-pay

## 2020-02-05 DIAGNOSIS — Z7951 Long term (current) use of inhaled steroids: Secondary | ICD-10-CM

## 2020-02-05 DIAGNOSIS — K219 Gastro-esophageal reflux disease without esophagitis: Secondary | ICD-10-CM | POA: Diagnosis present

## 2020-02-05 DIAGNOSIS — J1282 Pneumonia due to coronavirus disease 2019: Secondary | ICD-10-CM | POA: Diagnosis present

## 2020-02-05 DIAGNOSIS — Z66 Do not resuscitate: Secondary | ICD-10-CM | POA: Diagnosis present

## 2020-02-05 DIAGNOSIS — Z800 Family history of malignant neoplasm of digestive organs: Secondary | ICD-10-CM

## 2020-02-05 DIAGNOSIS — Z515 Encounter for palliative care: Secondary | ICD-10-CM | POA: Diagnosis not present

## 2020-02-05 DIAGNOSIS — Z8249 Family history of ischemic heart disease and other diseases of the circulatory system: Secondary | ICD-10-CM

## 2020-02-05 DIAGNOSIS — I252 Old myocardial infarction: Secondary | ICD-10-CM

## 2020-02-05 DIAGNOSIS — E785 Hyperlipidemia, unspecified: Secondary | ICD-10-CM | POA: Diagnosis present

## 2020-02-05 DIAGNOSIS — I5042 Chronic combined systolic (congestive) and diastolic (congestive) heart failure: Secondary | ICD-10-CM | POA: Diagnosis present

## 2020-02-05 DIAGNOSIS — N183 Chronic kidney disease, stage 3 unspecified: Secondary | ICD-10-CM | POA: Diagnosis present

## 2020-02-05 DIAGNOSIS — J449 Chronic obstructive pulmonary disease, unspecified: Secondary | ICD-10-CM

## 2020-02-05 DIAGNOSIS — Z7902 Long term (current) use of antithrombotics/antiplatelets: Secondary | ICD-10-CM

## 2020-02-05 DIAGNOSIS — Z79899 Other long term (current) drug therapy: Secondary | ICD-10-CM

## 2020-02-05 DIAGNOSIS — I959 Hypotension, unspecified: Secondary | ICD-10-CM | POA: Diagnosis present

## 2020-02-05 DIAGNOSIS — I130 Hypertensive heart and chronic kidney disease with heart failure and stage 1 through stage 4 chronic kidney disease, or unspecified chronic kidney disease: Secondary | ICD-10-CM | POA: Diagnosis present

## 2020-02-05 DIAGNOSIS — R451 Restlessness and agitation: Secondary | ICD-10-CM | POA: Diagnosis present

## 2020-02-05 DIAGNOSIS — Z7982 Long term (current) use of aspirin: Secondary | ICD-10-CM

## 2020-02-05 DIAGNOSIS — J61 Pneumoconiosis due to asbestos and other mineral fibers: Secondary | ICD-10-CM | POA: Diagnosis present

## 2020-02-05 DIAGNOSIS — J440 Chronic obstructive pulmonary disease with acute lower respiratory infection: Secondary | ICD-10-CM | POA: Diagnosis not present

## 2020-02-05 DIAGNOSIS — F419 Anxiety disorder, unspecified: Secondary | ICD-10-CM | POA: Diagnosis present

## 2020-02-05 DIAGNOSIS — Z7952 Long term (current) use of systemic steroids: Secondary | ICD-10-CM

## 2020-02-05 DIAGNOSIS — Z7901 Long term (current) use of anticoagulants: Secondary | ICD-10-CM

## 2020-02-05 DIAGNOSIS — I4510 Unspecified right bundle-branch block: Secondary | ICD-10-CM | POA: Diagnosis present

## 2020-02-05 DIAGNOSIS — I255 Ischemic cardiomyopathy: Secondary | ICD-10-CM | POA: Diagnosis present

## 2020-02-05 DIAGNOSIS — Z803 Family history of malignant neoplasm of breast: Secondary | ICD-10-CM

## 2020-02-05 DIAGNOSIS — R0602 Shortness of breath: Secondary | ICD-10-CM | POA: Diagnosis not present

## 2020-02-05 DIAGNOSIS — Z955 Presence of coronary angioplasty implant and graft: Secondary | ICD-10-CM

## 2020-02-05 DIAGNOSIS — J9621 Acute and chronic respiratory failure with hypoxia: Secondary | ICD-10-CM | POA: Diagnosis not present

## 2020-02-05 DIAGNOSIS — U071 COVID-19: Secondary | ICD-10-CM | POA: Diagnosis not present

## 2020-02-05 DIAGNOSIS — I2510 Atherosclerotic heart disease of native coronary artery without angina pectoris: Secondary | ICD-10-CM | POA: Diagnosis present

## 2020-02-05 DIAGNOSIS — Z96651 Presence of right artificial knee joint: Secondary | ICD-10-CM | POA: Diagnosis present

## 2020-02-05 DIAGNOSIS — R05 Cough: Secondary | ICD-10-CM | POA: Diagnosis not present

## 2020-02-05 DIAGNOSIS — I350 Nonrheumatic aortic (valve) stenosis: Secondary | ICD-10-CM | POA: Diagnosis present

## 2020-02-05 DIAGNOSIS — Z77090 Contact with and (suspected) exposure to asbestos: Secondary | ICD-10-CM | POA: Diagnosis present

## 2020-02-05 DIAGNOSIS — Z9049 Acquired absence of other specified parts of digestive tract: Secondary | ICD-10-CM

## 2020-02-05 DIAGNOSIS — Z9981 Dependence on supplemental oxygen: Secondary | ICD-10-CM

## 2020-02-05 DIAGNOSIS — R001 Bradycardia, unspecified: Secondary | ICD-10-CM | POA: Diagnosis present

## 2020-02-05 DIAGNOSIS — Z7189 Other specified counseling: Secondary | ICD-10-CM

## 2020-02-05 DIAGNOSIS — Z87891 Personal history of nicotine dependence: Secondary | ICD-10-CM

## 2020-02-06 ENCOUNTER — Emergency Department (HOSPITAL_COMMUNITY): Payer: Medicare Other

## 2020-02-06 DIAGNOSIS — R0602 Shortness of breath: Secondary | ICD-10-CM | POA: Diagnosis not present

## 2020-02-06 DIAGNOSIS — R05 Cough: Secondary | ICD-10-CM | POA: Diagnosis not present

## 2020-02-06 DIAGNOSIS — J9621 Acute and chronic respiratory failure with hypoxia: Secondary | ICD-10-CM | POA: Diagnosis present

## 2020-02-06 DIAGNOSIS — U071 COVID-19: Secondary | ICD-10-CM | POA: Diagnosis present

## 2020-02-06 DIAGNOSIS — Z7189 Other specified counseling: Secondary | ICD-10-CM | POA: Diagnosis not present

## 2020-02-06 DIAGNOSIS — I255 Ischemic cardiomyopathy: Secondary | ICD-10-CM | POA: Diagnosis present

## 2020-02-06 DIAGNOSIS — Z803 Family history of malignant neoplasm of breast: Secondary | ICD-10-CM | POA: Diagnosis not present

## 2020-02-06 DIAGNOSIS — I350 Nonrheumatic aortic (valve) stenosis: Secondary | ICD-10-CM | POA: Diagnosis present

## 2020-02-06 DIAGNOSIS — Z7901 Long term (current) use of anticoagulants: Secondary | ICD-10-CM | POA: Diagnosis not present

## 2020-02-06 DIAGNOSIS — Z8249 Family history of ischemic heart disease and other diseases of the circulatory system: Secondary | ICD-10-CM | POA: Diagnosis not present

## 2020-02-06 DIAGNOSIS — Z515 Encounter for palliative care: Secondary | ICD-10-CM | POA: Diagnosis present

## 2020-02-06 DIAGNOSIS — N183 Chronic kidney disease, stage 3 unspecified: Secondary | ICD-10-CM | POA: Diagnosis present

## 2020-02-06 DIAGNOSIS — J449 Chronic obstructive pulmonary disease, unspecified: Secondary | ICD-10-CM

## 2020-02-06 DIAGNOSIS — Z955 Presence of coronary angioplasty implant and graft: Secondary | ICD-10-CM | POA: Diagnosis not present

## 2020-02-06 DIAGNOSIS — I5042 Chronic combined systolic (congestive) and diastolic (congestive) heart failure: Secondary | ICD-10-CM | POA: Diagnosis present

## 2020-02-06 DIAGNOSIS — Z87891 Personal history of nicotine dependence: Secondary | ICD-10-CM | POA: Diagnosis not present

## 2020-02-06 DIAGNOSIS — K219 Gastro-esophageal reflux disease without esophagitis: Secondary | ICD-10-CM | POA: Diagnosis present

## 2020-02-06 DIAGNOSIS — Z96651 Presence of right artificial knee joint: Secondary | ICD-10-CM | POA: Diagnosis present

## 2020-02-06 DIAGNOSIS — J1282 Pneumonia due to coronavirus disease 2019: Secondary | ICD-10-CM | POA: Diagnosis present

## 2020-02-06 DIAGNOSIS — I130 Hypertensive heart and chronic kidney disease with heart failure and stage 1 through stage 4 chronic kidney disease, or unspecified chronic kidney disease: Secondary | ICD-10-CM | POA: Diagnosis present

## 2020-02-06 DIAGNOSIS — E785 Hyperlipidemia, unspecified: Secondary | ICD-10-CM | POA: Diagnosis present

## 2020-02-06 DIAGNOSIS — I2510 Atherosclerotic heart disease of native coronary artery without angina pectoris: Secondary | ICD-10-CM | POA: Diagnosis present

## 2020-02-06 DIAGNOSIS — I4510 Unspecified right bundle-branch block: Secondary | ICD-10-CM | POA: Diagnosis present

## 2020-02-06 DIAGNOSIS — J440 Chronic obstructive pulmonary disease with acute lower respiratory infection: Secondary | ICD-10-CM | POA: Diagnosis present

## 2020-02-06 DIAGNOSIS — Z66 Do not resuscitate: Secondary | ICD-10-CM | POA: Diagnosis present

## 2020-02-06 DIAGNOSIS — Z9049 Acquired absence of other specified parts of digestive tract: Secondary | ICD-10-CM | POA: Diagnosis not present

## 2020-02-06 DIAGNOSIS — Z800 Family history of malignant neoplasm of digestive organs: Secondary | ICD-10-CM | POA: Diagnosis not present

## 2020-02-06 DIAGNOSIS — I252 Old myocardial infarction: Secondary | ICD-10-CM | POA: Diagnosis not present

## 2020-02-06 LAB — CBC WITH DIFFERENTIAL/PLATELET
Abs Immature Granulocytes: 0.12 10*3/uL — ABNORMAL HIGH (ref 0.00–0.07)
Basophils Absolute: 0 10*3/uL (ref 0.0–0.1)
Basophils Relative: 0 %
Eosinophils Absolute: 0.2 10*3/uL (ref 0.0–0.5)
Eosinophils Relative: 1 %
HCT: 32 % — ABNORMAL LOW (ref 39.0–52.0)
Hemoglobin: 10.8 g/dL — ABNORMAL LOW (ref 13.0–17.0)
Immature Granulocytes: 1 %
Lymphocytes Relative: 4 %
Lymphs Abs: 0.5 10*3/uL — ABNORMAL LOW (ref 0.7–4.0)
MCH: 32.7 pg (ref 26.0–34.0)
MCHC: 33.8 g/dL (ref 30.0–36.0)
MCV: 97 fL (ref 80.0–100.0)
Monocytes Absolute: 1.1 10*3/uL — ABNORMAL HIGH (ref 0.1–1.0)
Monocytes Relative: 8 %
Neutro Abs: 12.3 10*3/uL — ABNORMAL HIGH (ref 1.7–7.7)
Neutrophils Relative %: 86 %
Platelets: 168 10*3/uL (ref 150–400)
RBC: 3.3 MIL/uL — ABNORMAL LOW (ref 4.22–5.81)
RDW: 12.9 % (ref 11.5–15.5)
WBC: 14.3 10*3/uL — ABNORMAL HIGH (ref 4.0–10.5)
nRBC: 0 % (ref 0.0–0.2)

## 2020-02-06 LAB — COMPREHENSIVE METABOLIC PANEL
ALT: 50 U/L — ABNORMAL HIGH (ref 0–44)
AST: 48 U/L — ABNORMAL HIGH (ref 15–41)
Albumin: 2.2 g/dL — ABNORMAL LOW (ref 3.5–5.0)
Alkaline Phosphatase: 62 U/L (ref 38–126)
Anion gap: 9 (ref 5–15)
BUN: 34 mg/dL — ABNORMAL HIGH (ref 8–23)
CO2: 27 mmol/L (ref 22–32)
Calcium: 7.5 mg/dL — ABNORMAL LOW (ref 8.9–10.3)
Chloride: 100 mmol/L (ref 98–111)
Creatinine, Ser: 1.41 mg/dL — ABNORMAL HIGH (ref 0.61–1.24)
GFR calc Af Amer: 52 mL/min — ABNORMAL LOW (ref >60)
GFR calc non Af Amer: 45 mL/min — ABNORMAL LOW (ref >60)
Glucose, Bld: 113 mg/dL — ABNORMAL HIGH (ref 70–99)
Potassium: 3.3 mmol/L — ABNORMAL LOW (ref 3.5–5.1)
Sodium: 136 mmol/L (ref 135–145)
Total Bilirubin: 1.4 mg/dL — ABNORMAL HIGH (ref 0.3–1.2)
Total Protein: 4.5 g/dL — ABNORMAL LOW (ref 6.5–8.1)

## 2020-02-06 LAB — APTT: aPTT: 36 seconds (ref 24–36)

## 2020-02-06 LAB — PROTIME-INR
INR: 1.6 — ABNORMAL HIGH (ref 0.8–1.2)
Prothrombin Time: 18.5 seconds — ABNORMAL HIGH (ref 11.4–15.2)

## 2020-02-06 LAB — RESPIRATORY PANEL BY RT PCR (FLU A&B, COVID)
Influenza A by PCR: NEGATIVE
Influenza B by PCR: NEGATIVE
SARS Coronavirus 2 by RT PCR: POSITIVE — AB

## 2020-02-06 LAB — LACTIC ACID, PLASMA: Lactic Acid, Venous: 1.6 mmol/L (ref 0.5–1.9)

## 2020-02-06 MED ORDER — VANCOMYCIN HCL 2000 MG/400ML IV SOLN
2000.0000 mg | Freq: Once | INTRAVENOUS | Status: DC
  Filled 2020-02-06: qty 400

## 2020-02-06 MED ORDER — LORAZEPAM 2 MG/ML IJ SOLN
1.0000 mg | INTRAMUSCULAR | Status: DC | PRN
  Administered 2020-02-06 – 2020-02-07 (×3): 1 mg via INTRAVENOUS
  Filled 2020-02-06 (×4): qty 1

## 2020-02-06 MED ORDER — GLYCOPYRROLATE 0.2 MG/ML IJ SOLN: 0.2000 mg | INTRAMUSCULAR | Status: DC | PRN

## 2020-02-06 MED ORDER — SODIUM CHLORIDE 0.9 % IV SOLN
2.0000 g | Freq: Once | INTRAVENOUS | Status: DC
  Filled 2020-02-06: qty 2

## 2020-02-06 MED ORDER — METRONIDAZOLE IN NACL 5-0.79 MG/ML-% IV SOLN
500.0000 mg | Freq: Once | INTRAVENOUS | Status: DC
  Filled 2020-02-06: qty 100

## 2020-02-06 MED ORDER — ONDANSETRON 4 MG PO TBDP: 4.0000 mg | ORAL_TABLET | Freq: Four times a day (QID) | ORAL | Status: DC | PRN

## 2020-02-06 MED ORDER — GLYCOPYRROLATE 0.2 MG/ML IJ SOLN
0.2000 mg | INTRAMUSCULAR | Status: DC | PRN
  Administered 2020-02-07: 0.2 mg via INTRAVENOUS
  Filled 2020-02-06: qty 1

## 2020-02-06 MED ORDER — POLYVINYL ALCOHOL 1.4 % OP SOLN
1.0000 [drp] | Freq: Four times a day (QID) | OPHTHALMIC | Status: DC | PRN
  Filled 2020-02-06: qty 15

## 2020-02-06 MED ORDER — BIOTENE DRY MOUTH MT LIQD: 15.0000 mL | OROMUCOSAL | Status: DC | PRN

## 2020-02-06 MED ORDER — MORPHINE SULFATE (PF) 2 MG/ML IV SOLN
1.0000 mg | INTRAVENOUS | Status: DC | PRN
  Administered 2020-02-06 – 2020-02-07 (×6): 1 mg via INTRAVENOUS
  Filled 2020-02-06 (×6): qty 1

## 2020-02-06 MED ORDER — LORAZEPAM 2 MG/ML PO CONC: 1.0000 mg | ORAL | Status: DC | PRN

## 2020-02-06 MED ORDER — VANCOMYCIN HCL IN DEXTROSE 1-5 GM/200ML-% IV SOLN: 1000.0000 mg | Freq: Once | INTRAVENOUS | Status: DC

## 2020-02-06 MED ORDER — ONDANSETRON HCL 4 MG/2ML IJ SOLN: 4.0000 mg | Freq: Four times a day (QID) | INTRAMUSCULAR | Status: DC | PRN

## 2020-02-06 MED ORDER — GLYCOPYRROLATE 1 MG PO TABS
1.0000 mg | ORAL_TABLET | ORAL | Status: DC | PRN
  Filled 2020-02-06: qty 1

## 2020-02-06 MED ORDER — LORAZEPAM 1 MG PO TABS: 1.0000 mg | ORAL_TABLET | ORAL | Status: DC | PRN

## 2020-02-06 NOTE — ED Notes (Signed)
Admitting service at bedside

## 2020-02-06 NOTE — ED Notes (Signed)
RN to RN report given to [facility] with no questions or concerns.

## 2020-02-06 NOTE — ED Provider Notes (Signed)
[facility] EMERGENCY DEPARTMENT Provider Note   CSN: [id number] Arrival date & time: 02/01/2020  2323     History Chief Complaint  Patient presents with  . Shortness of Breath    84% @ 4lpm [place]. Denies cp, dizzines, abd pain, n/v/d    Jon Baker is a 86 y.o. male.  Patient with past medical history notable for recent Covid pneumonia with admission and basement in SNF, CAD, COPD presents to the emergency department with a chief complaint of confusion, shortness of breath, and hypoxia.  He is from the skilled nursing facility with an oxygen level of 84% on 4 L nasal cannula.  His daughters report that he has been "in and out of it."  Patient is unable to contribute to his history due to confusion.  DNR/DNI Patient's daughter is bedside, and would like to proceed with antibiotics and IV medications, but does not want any invasive measures.  She is ok with BiPAP.  The history is provided by the patient. No language interpreter was used.       Past Medical History:  Diagnosis Date  . Aortic stenosis    a. Mild by echo 08/2015.   [name] CAD (coronary artery disease)    a. STEMI 05/2015 s/p DES to LAD.  [name] Claudication [facility])    a. 2009, saw  cardiology, declined ABIs.  [name] COPD (chronic obstructive pulmonary disease) ([place])    states he has been told by MD he has copd  . COPD (chronic obstructive pulmonary disease) ([place])   . Depression   . DJD (degenerative joint disease)   . Dyslipidemia 2016  . HTN (hypertension)   . Ischemic cardiomyopathy    a. 05/2015: EF 35% at cath, 35-40% by echo. b. EF improved to 55-60% by echo 08/2015.  [name] Pancreatitis, gallstone 05/2013  . RBBB   . STEMI (ST elevation myocardial infarction) ([place]) 05/20/15   3.0 x 28 mm Promus premier DES to the LAD    Patient Active Problem List   Diagnosis Date Noted  . Acute respiratory failure ([place])   . Pneumonia due to COVID-19 virus   . Chronic hypoxemic respiratory failure ([place])   . Pressure  injury of skin 01/25/2020  . Pneumonia 01/23/2020  . Chronic systolic heart failure ([place]) 06/09/2019  . Chronic fatigue 06/24/2018  . Cough 02/17/2018  . Constipation 12/24/2017  . Chronic respiratory failure with hypoxia ([place]) 11/25/2017  . CHF (congestive heart failure), NYHA class II ([place]) 11/11/2017  . ILD (interstitial lung disease) ([place]) 08/28/2017  . Nocturnal hypoxia 08/28/2017  . Hypoxemia 08/06/2017  . Edema 07/09/2017  . Dyspnea 07/09/2017  . Carrier of alpha-1-antitrypsin deficiency 04/03/2017  . Asbestos exposure 02/12/2017  . Actinic keratosis 11/05/2016  . GERD (gastroesophageal reflux disease) 10/08/2016  . Well adult exam 06/28/2015  . Bladder neck obstruction 06/28/2015  . Allergic rhinitis 06/28/2015  . CAD (coronary artery disease)   . Ischemic cardiomyopathy   . Dyslipidemia   . STEMI (ST elevation myocardial infarction) ([place]) 05/20/2015  . Neck pain 03/31/2015  . Paresthesia 03/31/2015  . Allergic reaction 08/31/2014  . Folliculitis 50/53/9767  . Depression 06/15/2013  . Gallstones 06/04/2013  . COPD, severe ([place]) 01/06/2013  . DEGENERATIVE JOINT DISEASE 12/24/2010  . DIZZINESS, "imbalance" 07/30/2010  . HYPERLIPIDEMIA 01/07/2008  . HYPERPLASIA PROSTATE UNS W/UR OBST & OTH LUTS 01/07/2008  . Essential hypertension 01/07/2008    Past Surgical History:  Procedure Laterality Date  . CARDIAC CATHETERIZATION N/A 05/20/2015   Procedure: Left  Heart Cath and Coronary Angiography;  Surgeon: [name], MD; LAD 99%, circumflex 50%, RCA 40%, EF 35% with akinesis in the mid anterior wall apex and inferior apex   . CARDIAC CATHETERIZATION N/A 05/20/2015   Procedure: Coronary Stent Intervention;  Surgeon: [name], MD;  3.0 x 28 mm Promus premier DES to the LAD  . CHOLECYSTECTOMY N/A 06/06/2013   Procedure: LAPAROSCOPIC CHOLECYSTECTOMY WITH INTRAOPERATIVE CHOLANGIOGRAM;  Surgeon: Liz Malady, MD;  Location: [facility] OR;  Service: General;  Laterality: N/A;    . Glanglian cyst removal Left 1970's  . myleogram  1979  . TOTAL KNEE ARTHROPLASTY Right 02-2011   Dr Despina Hick       Family History  Problem Relation Age of Onset  . Pancreatic cancer Mother   . Coronary artery disease Mother   . Coronary artery disease Father        several MI'S  . Breast cancer Sister   . Diabetes Neg Hx   . Lung disease Neg Hx     Social History   Tobacco Use  . Smoking status: Former Smoker    Packs/day: 2.00    Years: 49.00    Pack years: 98.00    Types: Cigarettes    Start date: 04/26/1939    Quit date: 12/10/1987    Years since quitting: 32.1  . Smokeless tobacco: Never Used  Substance Use Topics  . Alcohol use: No  . Drug use: No    Home Medications Prior to Admission medications   Medication Sig Start Date End Date Taking? Authorizing Provider  albuterol (VENTOLIN HFA) 108 (90 Base) MCG/ACT inhaler Inhale 2 puffs into the lungs every 6 (six) hours as needed for wheezing or shortness of breath.    [provider]  albuterol (VENTOLIN HFA) 108 (90 Base) MCG/ACT inhaler Inhale 2 puffs into the lungs in the morning, at noon, and at bedtime. 02/01/20   Leroy Sea, MD  amLODipine-valsartan (EXFORGE) 5-160 MG tablet Take 1 tablet by mouth daily. 08/19/19   [name], Georgina Quint, MD  apixaban (ELIQUIS) 2.5 MG TABS tablet Take 1 tablet (2.5 mg total) by mouth 2 (two) times daily for 13 days. 02/01/20 02/14/20  Leroy Sea, MD  aspirin EC 81 MG tablet Take 1 tablet (81 mg total) by mouth daily. 03/31/15   [name], Georgina Quint, MD  aspirin EC 81 MG tablet Take 1 tablet (81 mg total) by mouth daily. 02/15/20   Leroy Sea, MD  atorvastatin (LIPITOR) 80 MG tablet Take 1 tablet (80 mg total) by mouth daily at 6 PM. 06/24/19   [name], Georgina Quint, MD  atorvastatin (LIPITOR) 80 MG tablet Take 80 mg by mouth at bedtime. 12/25/19   [provider]  budesonide (PULMICORT) 0.25 MG/2ML nebulizer solution Take 2 mLs (0.25 mg total) by  nebulization 2 (two) times a day. 06/24/19   [name], Georgina Quint, MD  budesonide (PULMICORT) 0.25 MG/2ML nebulizer solution Take 0.25 mg by nebulization 2 (two) times daily.    [provider]  carvedilol (COREG) 25 MG tablet Take 1 tablet (25 mg total) by mouth 2 (two) times daily with a meal. 06/24/19   [name], Georgina Quint, MD  carvedilol (COREG) 25 MG tablet Take 25 mg by mouth 2 (two) times daily. 12/25/19   [provider]  cholecalciferol (VITAMIN D) 1000 UNITS tablet Take 1,000 Units by mouth daily.    [provider]  cholecalciferol (VITAMIN D3) 25 MCG (1000 UNIT) tablet Take 1,000 Units by mouth daily.  [provider]  clopidogrel (PLAVIX) 75 MG tablet Take 1 tablet (75 mg total) by mouth daily. NEED OV FOR FUTURE REFILL. 12/17/19   Runell Gess, MD  clopidogrel (PLAVIX) 75 MG tablet Take 75 mg by mouth daily at 6 PM.     [provider]  furosemide (LASIX) 40 MG tablet Take 1 tablet (40 mg total) by mouth 2 (two) times daily. 06/09/19 06/08/20  Runell Gess, MD  furosemide (LASIX) 80 MG tablet Take 1 tablet (80 mg total) by mouth daily. 02/02/20   Leroy Sea, MD  guaiFENesin (MUCINEX) 600 MG 12 hr tablet Take 600 mg by mouth daily.    [provider]  guaiFENesin (MUCINEX) 600 MG 12 hr tablet Take 600 mg by mouth daily.    [provider]  ipratropium-albuterol (DUONEB) 0.5-2.5 (3) MG/3ML SOLN Take 3 mLs by nebulization every 6 (six) hours as needed. 08/19/19   [name], Georgina Quint, MD  ipratropium-albuterol (DUONEB) 0.5-2.5 (3) MG/3ML SOLN Take 3 mLs by nebulization every 6 (six) hours as needed (shortness of breath/wheezing).    [provider]  isosorbide-hydrALAZINE (BIDIL) 20-37.5 MG tablet Take 2 tablets by mouth 3 (three) times daily. 02/01/20   Leroy Sea, MD  nitroGLYCERIN (NITROSTAT) 0.4 MG SL tablet Place 1 tablet (0.4 mg total) under the tongue every 5 (five) minutes as needed for chest  pain. 01/20/17   Saguier, Ramon Dredge, PA-C  pantoprazole (PROTONIX) 40 MG tablet Take 1 tablet (40 mg total) by mouth daily. 02/01/20   Leroy Sea, MD  potassium chloride SA (K-DUR) 20 MEQ tablet Take 4 tablets (80 mEq total) by mouth daily. 06/09/19   Runell Gess, MD  predniSONE (DELTASONE) 10 MG tablet TAKE 1 TABLET BY MOUTH EVERY MORNING AFTER MEALS 07/27/19   [name], Georgina Quint, MD  predniSONE (DELTASONE) 5 MG tablet Take 2 tablets (10 mg total) by mouth daily with breakfast. Label  & dispense according to the schedule below. take 8 Pills PO for 3 days, 6 Pills PO for 3 days, 4 Pills PO for 3 days, 2 Pills PO for 3 days, 1 Pills PO for 3 days, 1/2 Pill  PO for 3 days then STOP. Total 65 pills. 02/01/20   Leroy Sea, MD  Pseudoeph-Doxylamine-DM-APAP (NYQUIL PO) Take 30 mLs by mouth at bedtime.    [provider]  spironolactone (ALDACTONE) 25 MG tablet Take 1 tablet (25 mg total) by mouth daily. 02/02/20   Leroy Sea, MD  VENTOLIN HFA 108 (90 Base) MCG/ACT inhaler INHALE 2 PUFFS INTO THE LUNGS EVERY 6 HOURS AS NEEDED FOR WHEEZING/SHORTNESS OF BREATH 12/31/19   [name], Georgina Quint, MD    Allergies    Patient has no known allergies.  Review of Systems   Review of Systems  Unable to perform ROS: Mental status change    Physical Exam Updated Vital Signs BP (!) 137/118 (BP Location: Right Arm)   Pulse (!) 53   Temp 98.2 F (36.8 C) (Oral)   Resp (!) 28   SpO2 95%   Physical Exam Vitals and nursing note reviewed.  Constitutional:      Appearance: He is well-developed. He is ill-appearing.  HENT:     Head: Normocephalic and atraumatic.  Eyes:     Conjunctiva/sclera: Conjunctivae normal.  Cardiovascular:     Rate and Rhythm: Regular rhythm.     Heart sounds: No murmur.  Pulmonary:     Effort: Respiratory distress present.     Breath  sounds: Wheezing present.     Comments: tachypnea Abdominal:     Palpations: Abdomen is soft.     Tenderness: There is  no abdominal tenderness.  Musculoskeletal:        General: Normal range of motion.     Cervical back: Neck supple.  Skin:    General: Skin is warm and dry.  Neurological:     Comments: Awakens to voice Intermittent response to commands   Psychiatric:     Comments: Mildly agitated     ED Results / Procedures / Treatments   Labs (all labs ordered are listed, but only abnormal results are displayed) Labs Reviewed  CULTURE, BLOOD (ROUTINE X 2)  CULTURE, BLOOD (ROUTINE X 2)  URINE CULTURE  LACTIC ACID, PLASMA  LACTIC ACID, PLASMA  COMPREHENSIVE METABOLIC PANEL  CBC WITH DIFFERENTIAL/PLATELET  APTT  PROTIME-INR  URINALYSIS, ROUTINE W REFLEX MICROSCOPIC  BRAIN NATRIURETIC PEPTIDE    EKG EKG Interpretation  Date/Time:  Sunday February 06 2020 00:03:16 EST Ventricular Rate:  86 PR Interval:    QRS Duration: 163 QT Interval:  618 QTC Calculation: 564 R Axis:   47 Text Interpretation: Sinus rhythm Multiple ventricular premature complexes Probable left atrial enlargement Right bundle branch block Artifact Poor data quality in current ECG precludes serial comparison Confirmed by Drema Pry ([id number]) on 02/06/2020 12:22:54 AM   Radiology No results found.  Procedures Procedures (including critical care time)  Medications Ordered in ED Medications  ceFEPIme (MAXIPIME) 2 g in sodium chloride 0.9 % 100 mL IVPB (has no administration in time range)  metroNIDAZOLE (FLAGYL) IVPB 500 mg (has no administration in time range)  vancomycin (VANCOREADY) IVPB 2000 mg/400 mL (has no administration in time range)    ED Course  I have reviewed the triage vital signs and the nursing notes.  Pertinent labs & imaging results that were available during my care of the patient were reviewed by me and considered in my medical decision making (see chart for details).    MDM Rules/Calculators/A&P                      Patient here with worsening SOB.  At SNF after having been discharged  with COVID pneumonia.  Increased O2 requirement.  Failing outpatient antibiotics.  Patient doesn't have capacity to make decisions for himself at this time due to AMS.  Has MOST form.  Plan to continue with IV antibiotics, but daughter questioning whether or not to move to comfort measures only.  She would like to talk with her sister.   1:31 AM Family wishes to make patient comfort measures only.  Seen by and discussed with Dr. Eudelia Bunch.  Will consult hospitalist for admission for comfort measures.  Final Clinical Impression(s) / ED Diagnoses Final diagnoses:  Pneumonia due to COVID-19 virus    Rx / DC Orders ED Discharge Orders    None       Roxy Horseman, PA-C 02/06/20 0135    Nira Conn, MD 02/06/20 [phone]

## 2020-02-06 NOTE — Progress Notes (Signed)
   02/06/20 1103  TOC ED Mini Assessment  What brought you to the Emergency Department?  Patient unable to state, brought in for declining medical status  Barriers to Discharge Continued Medical Work up  [employer] interventions Unclear prognosis  Patient states their goals for this hospitalization and ongoing recovery are: Unable to verbalize   CSW received consult for patient for home health or hospice referral. CSW spoke with patient's daughter who was present and noted she initially declined services and reports she was informed her father will likely pass today. CSW reached out to attending MD regarding prognosis and medical need for referral for inpatient/outpatient hospital for clarification and will follow when MD reaches out.

## 2020-02-06 NOTE — Progress Notes (Signed)
Civil engineer, contracting [facility])  Referral for [employer] [facility] vs hospice at home.  Pt is still COVID +, and we do not have a bed for him at [facility].  Discussed with family residential facility in [place] and [place] that accept COVID + pts.  Family does not want to consider either locations.  After learning he is still technically positive, family is fearful of taking him home and exposing others that would be caring for him.  His wife, who is at home with COVID, ultimately made the decision that they cannot care for him at home and requested that he be admitted to the hospital.   They are aware that at any time they can change their mind and take him home with hospice support.  Thank you, Wallis Bamberg RN, BSN, CCRN [facility] Liaison  [phone]

## 2020-02-06 NOTE — Progress Notes (Signed)
   02/06/20 0900  Clinical Encounter Type  Visited With Patient and family together  Visit Type ED;Patient actively dying  Referral From Nurse  Consult/Referral To Chaplain  Spiritual Encounters  Spiritual Needs Prayer;Emotional;Grief support   Chaplain responded to consult for end of life family support. Freedom was resting peacefully during the visit with his daughter at his bedside. Almer's daughter requested that her sister be allowed to be with her and Sayyid through this time of end of life. Chaplain spoke with RN. RN told Chaplain that it is a [employer] that patients are only allowed one visitor. Chaplain explained this to the daughter. The daughter was not happy, but said she did not want to cause a big problem so she sat quietly, turned towards [place] and began crying. Chaplain offered ministry of presence and prayer.  Chaplain remains available for support as needs arise.   Chaplain Resident, Amado Coe, M Div.  [phone] on-call pager

## 2020-02-06 NOTE — ED Notes (Signed)
PT is not D/C  But is waiting for SW to set up Palliative Care. % west called to report Pt will not be coming to floor and bed placement called to remove Adm. Room for PT.

## 2020-02-06 NOTE — ED Notes (Signed)
Daughter refusing ABX for the pt and wants comfort care. [place] PA made aware.

## 2020-02-06 NOTE — ED Notes (Signed)
Pt's daughter stated "I'm not going to the floor for fifteen minutes and then leaving and I want my father to be home". Charge RN made aware. Pt resting comfortably at bedside. No distress noted. This RN waiting for call from [facility] MD.

## 2020-02-06 NOTE — Progress Notes (Signed)
Communicated with Daughter Revonda Standard [name] regarding her fathers arriving to the unit. Daughter wishes to be notify when her father is near death. Nurse informed her that we will contact her with any changes in her fathers condition.

## 2020-02-06 NOTE — ED Notes (Signed)
[place] PA at bedside.  

## 2020-02-06 NOTE — H&P (Addendum)
History and Physical    Jon Baker JJK:[id number] DOB: 1932/08/09 DOA: 01/27/2020  PCP: Cassandria Anger, MD Patient coming from: SNF  Chief Complaint: Hypoxia  HPI: Jon Baker is a 86 y.o. 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  His oxygen saturation was 85% on 4 L supplemental oxygen at his SNF.    Patient was recently admitted to the hospital 01/23/2020-02/01/2020 for acute on chronic hypoxic respiratory failure secondary to COVID-19 pneumonia and acute on chronic CHF with EF 35-40% superimposed on his advanced COPD and ILD.  He was intubated and admitted to the ICU.  He was brought into the ER unresponsive and had a fever.  He was intubated in the ER and admitted to the ICU.  Required pressors.  He was treated for COVID-19 and diuresed with IV Lasix.  His D-dimer was elevated during this hospitalization and he did not receive chemical prophylaxis due to hematuria while he was admitted in the ICU.  Lower extremity Dopplers negative for DVT.  Since his D-dimer was still quite high, he was placed on prophylactic dose Eliquis with plan to continue it for 2 weeks.  Discharged to SNF on prednisone 10 mg daily which she has been taking chronically.  Patient did not have decision-making capacity due to his altered mental status.  ED provider had a conversation with the patient's daughter who expressed that the patient is DNR and DNI.  She did not want any invasive measures and decided to transition the patient to full comfort care.  No history could be obtained from the patient due to his altered mental status.  I have also spoken with the patient's daughter Jon Baker who  confirms that patient is DNR and DNI. Daughter again confirms that she does not want any further treatment for the patient's illness. She wants him to be transitioned to full comfort care.  She understands that he will likely pass away during this hospitalization.  ED Course: Afebrile.  Bradycardic with heart rate in the 40s to 50s.  Tachypneic with respiratory rate in the 20s on arrival.  Placed on 15 L oxygen via nonrebreather.  Hypotensive with systolic as low as 37J.  Labs showing WBC count 14.3, hemoglobin 10.8, platelet count 168.  Sodium 136, potassium 3.3, chloride 100, bicarb 27, BUN 34, creatinine 1.4, and glucose 113.  AST 48, ALT 50, alk phos 62, and T bili 1.4.  INR 1.6.  Lactic acid 1.6.  UA and urine culture pending.  Blood culture x2 pending.  BNP level pending.  Chest x-ray showing interval development of bilateral perihilar groundglass airspace disease, compatible with COVID-19 pneumonia.  Review of Systems:  All systems reviewed and apart from history of presenting illness, are negative.  Past Medical History:  Diagnosis Date  . Aortic stenosis    a. Mild by echo 08/2015.   [name] CAD (coronary artery disease)    a. STEMI 05/2015 s/p DES to LAD.  [name] Claudication [facility])    a. 2009, saw  cardiology, declined ABIs.  [name] COPD (chronic obstructive pulmonary disease) ([place])    states he has been told by MD he has copd  . COPD (chronic obstructive pulmonary disease) ([place])   .  Depression   . DJD (degenerative joint disease)   . Dyslipidemia 2016  . HTN (hypertension)   . Ischemic cardiomyopathy    a. 05/2015: EF 35% at cath, 35-40% by echo. b. EF improved to 55-60% by echo 08/2015.  [name] Pancreatitis, gallstone 05/2013  . RBBB   . STEMI (ST elevation myocardial infarction) ([place]) 05/20/15   3.0 x 28 mm Promus premier DES to the LAD    Past Surgical History:  Procedure Laterality Date  . CARDIAC CATHETERIZATION N/A 05/20/2015   Procedure: Left Heart Cath and Coronary Angiography;  Surgeon:  Sherren Mocha, MD; LAD 99%, circumflex 50%, RCA 40%, EF 35% with akinesis in the mid anterior wall apex and inferior apex   . CARDIAC CATHETERIZATION N/A 05/20/2015   Procedure: Coronary Stent Intervention;  Surgeon: Sherren Mocha, MD;  3.0 x 28 mm Promus premier DES to the LAD  . CHOLECYSTECTOMY N/A 06/06/2013   Procedure: LAPAROSCOPIC CHOLECYSTECTOMY WITH INTRAOPERATIVE CHOLANGIOGRAM;  Surgeon: Zenovia Jarred, MD;  Location: [place];  Service: General;  Laterality: N/A;  . Glanglian cyst removal Left 1970's  . myleogram  1979  . TOTAL KNEE ARTHROPLASTY Right 02-2011   Dr Maureen Ralphs     reports that he quit smoking about 32 years ago. His smoking use included cigarettes. He started smoking about 80 years ago. He has a 98.00 pack-year smoking history. He has never used smokeless tobacco. He reports that he does not drink alcohol or use drugs.  No Known Allergies  Family History  Problem Relation Age of Onset  . Pancreatic cancer Mother   . Coronary artery disease Mother   . Coronary artery disease Father        several MI'S  . Breast cancer Sister   . Diabetes Neg Hx   . Lung disease Neg Hx     Prior to Admission medications   Medication Sig Start Date End Date Taking? Authorizing Provider  albuterol (VENTOLIN HFA) 108 (90 Base) MCG/ACT inhaler Inhale 2 puffs into the lungs every 6 (six) hours as needed for wheezing or shortness of breath.   Yes [provider]  apixaban (ELIQUIS) 2.5 MG TABS tablet Take 1 tablet (2.5 mg total) by mouth 2 (two) times daily for 13 days. 02/01/20 02/14/20 Yes Thurnell Lose, MD  ascorbic acid (VITAMIN C) 500 MG tablet Take 1,000 mg by mouth daily.   Yes [provider]  atorvastatin (LIPITOR) 80 MG tablet Take 80 mg by mouth at bedtime. 12/25/19  Yes [provider]  budesonide (PULMICORT) 0.25 MG/2ML nebulizer solution Take 2 mLs (0.25 mg total) by nebulization 2 (two) times a day. 06/24/19  Yes [name], [name], MD  carvedilol  (COREG) 25 MG tablet Take 1 tablet (25 mg total) by mouth 2 (two) times daily with a meal. 06/24/19  Yes [name], [name], MD  cefTRIAXone (ROCEPHIN) IVPB Inject 1 g into the vein daily. For 3 days - started 02/04/20   Yes [provider]  cholecalciferol (VITAMIN D3) 25 MCG (1000 UNIT) tablet Take 1,000 Units by mouth daily.   Yes [provider]  clopidogrel (PLAVIX) 75 MG tablet Take 1 tablet (75 mg total) by mouth daily. NEED OV FOR FUTURE REFILL. 12/17/19  Yes Lorretta Harp, MD  famotidine (PEPCID) 20 MG tablet Take 20 mg by mouth 2 (two) times daily.   Yes [provider]  furosemide (LASIX) 80 MG tablet Take 1 tablet (80 mg total) by mouth daily. 02/02/20  Yes Thurnell Lose,  MD  guaiFENesin (MUCINEX) 600 MG 12 hr tablet Take 600 mg by mouth daily.   Yes [provider]  ipratropium-albuterol (DUONEB) 0.5-2.5 (3) MG/3ML SOLN Take 3 mLs by nebulization every 6 (six) hours as needed (shortness of breath/wheezing).   Yes [provider]  isosorbide-hydrALAZINE (BIDIL) 20-37.5 MG tablet Take 2 tablets by mouth 3 (three) times daily. 02/01/20  Yes Thurnell Lose, MD  predniSONE (DELTASONE) 10 MG tablet TAKE 1 TABLET BY MOUTH EVERY MORNING AFTER MEALS 07/27/19  Yes [name], [name], MD  predniSONE (DELTASONE) 5 MG tablet Take 2 tablets (10 mg total) by mouth daily with breakfast. Label  & dispense according to the schedule below. take 8 Pills PO for 3 days, 6 Pills PO for 3 days, 4 Pills PO for 3 days, 2 Pills PO for 3 days, 1 Pills PO for 3 days, 1/2 Pill  PO for 3 days then STOP. Total 65 pills. 02/01/20  Yes Thurnell Lose, MD  spironolactone (ALDACTONE) 25 MG tablet Take 1 tablet (25 mg total) by mouth daily. 02/02/20  Yes Thurnell Lose, MD  zinc gluconate 50 MG tablet Take 50 mg by mouth daily.   Yes [provider]  amLODipine-valsartan (EXFORGE) 5-160 MG tablet Take 1 tablet by mouth daily. Patient not taking: Reported on  02/06/2020 08/19/19   [name], [name], MD  aspirin EC 81 MG tablet Take 1 tablet (81 mg total) by mouth daily. Patient not taking: Reported on 02/06/2020 02/15/20   Thurnell Lose, MD  nitroGLYCERIN (NITROSTAT) 0.4 MG SL tablet Place 1 tablet (0.4 mg total) under the tongue every 5 (five) minutes as needed for chest pain. 01/20/17   Saguier, Percell Miller, PA-C  pantoprazole (PROTONIX) 40 MG tablet Take 1 tablet (40 mg total) by mouth daily. Patient not taking: Reported on 02/06/2020 02/01/20   Thurnell Lose, MD  potassium chloride SA (K-DUR) 20 MEQ tablet Take 4 tablets (80 mEq total) by mouth daily. Patient not taking: Reported on 02/06/2020 06/09/19   Lorretta Harp, MD    Physical Exam: Vitals:   02/06/2020 2327 02/04/2020 2345 02/06/20 0015 02/06/20 0045  BP: (!) 137/118 (!) 74/59 (!) 88/68 (!) 94/58  Pulse: (!) 53 (!) 51 (!) 48 (!) 46  Resp: (!) 28 (!) '23 20 20  '$ Temp: 98.2 F (36.8 C)     TempSrc: Oral     SpO2: 95% 97% 98% 95%    Physical Exam  Constitutional: He appears well-developed and well-nourished.  HENT:  Head: Normocephalic.  Eyes: Right eye exhibits no discharge. Left eye exhibits no discharge.  Cardiovascular: Normal rate, regular rhythm and intact distal pulses.  Pulmonary/Chest: He has no wheezes.  Tachypneic Coarse breath sounds bilaterally  Abdominal: Soft. Bowel sounds are normal. There is no abdominal tenderness. There is no guarding.  Musculoskeletal:     Cervical back: Neck supple.     Comments: Trace edema of bilateral lower extremities  Neurological:  Obtunded  Skin: Skin is warm and dry. He is not diaphoretic.     Labs on Admission: I have personally reviewed following labs and imaging studies  CBC: Recent Labs  Lab 01/30/20 0500 01/31/20 0310 02/06/20 0010  WBC 15.2* 14.8* 14.3*  NEUTROABS 14.0* 13.4* 12.3*  HGB 11.2* 11.1* 10.8*  HCT 33.0* 32.8* 32.0*  MCV 96.5 95.1 97.0  PLT 137* 179 889   Basic Metabolic Panel: Recent Labs  Lab  01/30/20 0500 01/31/20 0310 02/06/20 0010  NA 139 138 136  K  4.2 3.8 3.3*  CL 103 99 100  CO2 '25 29 27  '$ GLUCOSE 132* 132* 113*  BUN 38* 37* 34*  CREATININE 1.11 1.10 1.41*  CALCIUM 7.6* 7.8* 7.5*  MG 2.1 2.2  --    GFR: Estimated Creatinine Clearance: 47.2 mL/min (A) (by C-G formula based on SCr of 1.41 mg/dL (H)). Liver Function Tests: Recent Labs  Lab 01/30/20 0500 01/31/20 0310 02/06/20 0010  AST 58* 50* 48*  ALT 85* 83* 50*  ALKPHOS 43 49 62  BILITOT 1.0 1.2 1.4*  PROT 4.8* 4.8* 4.5*  ALBUMIN 2.6* 2.7* 2.2*   No results for input(s): LIPASE, AMYLASE in the last 168 hours. No results for input(s): AMMONIA in the last 168 hours. Coagulation Profile: Recent Labs  Lab 02/06/20 0010  INR 1.6*   Cardiac Enzymes: No results for input(s): CKTOTAL, CKMB, CKMBINDEX, TROPONINI in the last 168 hours. BNP (last 3 results) No results for input(s): PROBNP in the last 8760 hours. HbA1C: No results for input(s): HGBA1C in the last 72 hours. CBG: No results for input(s): GLUCAP in the last 168 hours. Lipid Profile: No results for input(s): CHOL, HDL, LDLCALC, TRIG, CHOLHDL, LDLDIRECT in the last 72 hours. Thyroid Function Tests: No results for input(s): TSH, T4TOTAL, FREET4, T3FREE, THYROIDAB in the last 72 hours. Anemia Panel: No results for input(s): VITAMINB12, FOLATE, FERRITIN, TIBC, IRON, RETICCTPCT in the last 72 hours. Urine analysis:    Component Value Date/Time   COLORURINE YELLOW 01/23/2020 1334   APPEARANCEUR CLEAR 01/23/2020 1334   LABSPEC 1.014 01/23/2020 1334   PHURINE 7.0 01/23/2020 1334   GLUCOSEU NEGATIVE 01/23/2020 1334   GLUCOSEU NEGATIVE 03/31/2015 1038   HGBUR NEGATIVE 01/23/2020 1334   BILIRUBINUR NEGATIVE 01/23/2020 1334   KETONESUR NEGATIVE 01/23/2020 1334   PROTEINUR 30 (A) 01/23/2020 1334   UROBILINOGEN 0.2 03/31/2015 1038   NITRITE NEGATIVE 01/23/2020 1334   LEUKOCYTESUR NEGATIVE 01/23/2020 1334    Radiological Exams on Admission: DG  Chest Port 1 View  Result Date: 02/06/2020 CLINICAL DATA:  Cough, short of breath, COPD, asbestos related lung disease, COVID-19 positive EXAM: PORTABLE CHEST 1 VIEW COMPARISON:  01/28/2020 FINDINGS: Single frontal view of the chest demonstrates marked progression of interstitial and ground-glass opacity seen bilaterally. No effusion or pneumothorax. Cardiac silhouette remains enlarged. No acute bony abnormalities. IMPRESSION: 1. Interval development of bilateral perihilar ground-glass airspace disease, compatible with COVID-19 pneumonia. Electronically Signed   By: Randa Ngo M.D.   On: 02/06/2020 00:45    EKG: Independently reviewed.  Poor quality study with baseline wander in multiple leads making interpretation difficult.  Repeat EKG pending.  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  Chest x-ray showing findings concerning for worsening multifocal pneumonia.  Patient does not have decision-making capacity due to his altered mental status.  His daughter does not want any further medical treatment for his illness and has decided to transition him to full comfort care.  He is DNR and DNI.  End-of-life care -Continue supplemental  oxygen -Morphine as needed for air hunger -Ativan as needed -Glycopyrrolate as needed for excessive secretions -Antiemetic as needed for nausea/vomiting -Palliative care consult for symptom management -Spiritual care consult  Acute on chronic hypoxic respiratory failure secondary to multifocal pneumonia, recent COVID-19 diagnosis -Continue supplemental oxygen -Full comfort care as above  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 -Full comfort care as above  CAD with history of STEMI status post PCI -Full comfort care as above  Chronic combined CHF -Full comfort care as above  DVT prophylaxis: None.  Full comfort care as above. Code Status: DNR and DNI Family Communication: Daughter Jon Baker at bedside. Disposition Plan: Anticipate likely death during this hospitalization. Consults called: Palliative care, spiritual care Admission status: It is my clinical opinion that admission to INPATIENT is reasonable and necessary because of the expectation that this patient will require hospital care that crosses at least 2 midnights to treat this condition based on the medical complexity of the problems presented.  Given the aforementioned information, the predictability of an adverse outcome is felt to be significant.  The medical decision making on this patient was of high complexity and the patient is at high risk for clinical deterioration, therefore this is a level 3 visit.  Shela Leff MD [facility]  If 7PM-7AM, please contact night-coverage [email or web address]  02/06/2020, 3:18 AM

## 2020-02-06 NOTE — Progress Notes (Signed)
Nurse called to update patient's daughter after assessing patient.  Patient resting quietly on 15L/NRB, sats near 100%  (will attempt to wean).  Patient in no apparent distress.  Patient not alert, and non interactive at present.  Nurse discussed with daughter the protocol for visiting patients on Covid isolation. Daughter told to call in morning to set up visit.  Will update daughter with any changes in patient condition.

## 2020-02-06 NOTE — ED Notes (Signed)
Care endorsed to Audrey, RN.

## 2020-02-06 NOTE — Progress Notes (Signed)
PROGRESS NOTE                                                                                                                                                                                                             Patient Demographics:    Jon Baker, is a 86 y.o. male, DOB - 1932/08/25, YBW:[id number]  Admit date - 02/19/20   Admitting Physician John Giovanni, MD  Outpatient Primary MD for the patient is [name], Georgina Quint, MD  LOS - 0   Chief Complaint  Patient presents with  . Shortness of Breath    85% @ 4lpm [place]. Denies cp, dizzines, abd pain, n/v/d       Brief Narrative   This is a no charge note as patient was seen and admitted earlier today by Dr.Vasundra, chart, imaging and labs were reviewed.  86 y.o. 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  His oxygen saturation was 85% on 4 L supplemental oxygen at his SNF.    Patient was recently admitted to the hospital 01/23/2020-02/01/2020 for acute on chronic hypoxic respiratory failure secondary to COVID-19 pneumonia and acute on chronic CHF with EF 35-40% superimposed on his advanced COPD and ILD.  He was intubated and admitted to the ICU.  He was brought into the ER unresponsive and had a fever.  He was intubated in the ER and admitted to the ICU.  Required pressors.  He was treated for COVID-19 and diuresed with IV Lasix.  His D-dimer was elevated during this hospitalization and he did not receive chemical prophylaxis due to hematuria while he was admitted in the ICU.  Lower extremity Dopplers negative for DVT.  Since his D-dimer was still quite high, he was placed  on prophylactic dose Eliquis with plan to continue it for 2 weeks.  Discharged to SNF on prednisone 10 mg daily which she has been taking chronically.  Patient did not have decision-making capacity due to his altered mental status.  ED provider had a conversation with the patient's daughter who  expressed that the patient is DNR and DNI.  She did not want any invasive measures and decided to transition the patient to full comfort care.  Patient was admitted, family had issues with the visitation policy for COVID-19 positive patient, and admission to the floor has been held given patient's daughter that agreeable for admission, palliative medicine were consulted, to assist with disposition.   Subjective:    Jon Baker today is obtunded, unable to provide any complaints.   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  Chest x-ray showing findings concerning for worsening multifocal pneumonia.  Patient does not have decision-making capacity due to his altered mental status.  His daughter does not want any further medical treatment for his illness and has decided to transition him to full comfort care.  He is DNR and  DNI.  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  Lab Results  Component Value Date   SARSCOV2NAA POSITIVE (A) 02/06/2020   SARSCOV2NAA POSITIVE (A) 01/23/2020   I have discussed with daughter at bedside, who was extremely emotional,and tearful at some times, understands patient is at end-of-life, our main goal here is comfort, he currently appears to be comfortable on current regimen of as needed morphine and Ativan, sleeping comfortably, with no respiratory distress, disposition has been an issue here, family interested on hospice, given the fact it allows him some visitation, while hospital policy does not allow any visitations, patient was retested to see if he qualifies [facility], but his COVID-19 PCR remains positive, so he is not a candidate for [facility], palliative medicine consulted, home with home hospice was offered by hospice, family is fearful of taking him home and exposing others taking care of him, this point decision to agree to admit him to the hospital.  Code Status : DNR /Comfort  Family Communication  : Discussed with daughter Consuella Lose at bedside, and daughter Revonda Standard via phone  Disposition Plan  : Pending clinical progression  Consults  :  Palliative  DVT  Prophylaxis  :  Comfort  Lab Results  Component Value Date   PLT 168 02/06/2020    Antibiotics  :  Anti-infectives (From admission, onward)   Start     Dose/Rate Route Frequency Ordered Stop   02/06/20 0030  vancomycin (VANCOREADY) IVPB 2000 mg/400 mL  Status:  Discontinued     2,000 mg 200 mL/hr over 120 Minutes Intravenous  Once 02/06/20 0013 02/06/20 0241   02/06/20 0015  ceFEPIme (MAXIPIME) 2 g in sodium chloride 0.9 % 100 mL IVPB  Status:  Discontinued     2 g 200 mL/hr over 30 Minutes Intravenous  Once 02/06/20 0009 02/06/20 0241   02/06/20 0015  metroNIDAZOLE (FLAGYL) IVPB 500 mg  Status:  Discontinued     500 mg 100 mL/hr over 60 Minutes Intravenous  Once 02/06/20 0009 02/06/20 0241   02/06/20 0015  vancomycin (VANCOCIN) IVPB 1000 mg/200 mL premix  Status:  Discontinued     1,000 mg 200 mL/hr over 60 Minutes Intravenous  Once 02/06/20 0009 02/06/20 0013        Objective:   Vitals:   02/06/20 1315 02/06/20 1345 02/06/20 1430 02/06/20 1515  BP:      Pulse: (!) 51 (!) 53 (!) 54 (!) 55  Resp: Temp:      TempSrc:      SpO2: 93% 94% 92% 91%    Wt Readings from Last 3 Encounters:  01/30/20 104.8 kg  10/27/19 101.2 kg  08/19/19 99.3 kg    No intake or output data in the 24 hours ending 02/06/20 1634   Physical Exam  Obtundent, unresponsive, does not respond to verbal or tactile stimuli . Symmetrical chest wall movement bilaterally, good air entry, no respiratory distress . Regular rate and rhythm  Bowel sounds present  No cyanosis or edema in lower ext.    Data Review:    CBC Recent Labs  Lab 01/31/20 0310 02/06/20 0010  WBC 14.8* 14.3*  HGB 11.1* 10.8*  HCT 32.8* 32.0*  PLT 179 168  MCV 95.1 97.0  MCH 32.2 32.7  MCHC 33.8 33.8  RDW 12.8 12.9  LYMPHSABS 0.4* 0.5*  MONOABS 0.7 1.1*  EOSABS 0.0 0.2  BASOSABS 0.0 0.0    Chemistries  Recent Labs  Lab 01/31/20 0310 02/06/20 0010  NA 138 136  K 3.8 3.3*  CL 99 100  CO2 29  27  GLUCOSE 132* 113*  BUN 37* 34*  CREATININE 1.10 1.41*  CALCIUM 7.8* 7.5*  MG 2.2  --   AST 50* 48*  ALT 83* 50*  ALKPHOS 49 62  BILITOT 1.2 1.4*   ------------------------------------------------------------------------------------------------------------------ No results for input(s): CHOL, HDL, LDLCALC, TRIG, CHOLHDL, LDLDIRECT in the last 72 hours.  Lab Results  Component Value Date   HGBA1C 5.6 05/22/2015   ------------------------------------------------------------------------------------------------------------------ No results for input(s): TSH, T4TOTAL, T3FREE, THYROIDAB in the last 72 hours.  Invalid input(s): FREET3 ------------------------------------------------------------------------------------------------------------------ No results for input(s): VITAMINB12, FOLATE, FERRITIN, TIBC, IRON, RETICCTPCT in the last 72 hours.  Coagulation profile Recent Labs  Lab 02/06/20 0010  INR 1.6*    No results for input(s): DDIMER in the last 72 hours.  Cardiac Enzymes No results for input(s): CKMB, TROPONINI, MYOGLOBIN in the last 168 hours.  Invalid input(s): CK ------------------------------------------------------------------------------------------------------------------    Component Value Date/Time   BNP 286.6 (H) 01/31/2020 0310    Inpatient Medications  Scheduled Meds: Continuous Infusions: PRN Meds:.antiseptic oral rinse, glycopyrrolate **OR** glycopyrrolate **OR** glycopyrrolate, LORazepam **OR** LORazepam **OR** LORazepam, morphine injection, ondansetron **OR** ondansetron (ZOFRAN) IV, polyvinyl alcohol  Micro Results Recent Results (from the past 240 hour(s))  Respiratory Panel by RT  PCR (Flu A&B, Covid) - Nasopharyngeal Swab     Status: Abnormal   Collection Time: 02/06/20  1:20 PM   Specimen: Nasopharyngeal Swab  Result Value Ref Range Status   SARS Coronavirus 2 by RT PCR POSITIVE (A) NEGATIVE Final    Comment: RESULT CALLED TO, READ BACK  BY AND VERIFIED WITH: A MCKOZWN [id number] AT 1453 BY NFIELDS (NOTE) SARS-CoV-2 target nucleic acids are DETECTED. SARS-CoV-2 RNA is generally detectable in upper respiratory specimens  during the acute phase of infection. Positive results are indicative of the presence of the identified virus, but do not rule out bacterial infection or co-infection with other pathogens not detected by the test. Clinical correlation with patient history and other diagnostic information is necessary to determine patient infection status. The expected result is Negative. Fact Sheet for Patients:  https://www.moore.com/ Fact Sheet for Healthcare Providers: https://www.young.biz/ This test is not yet approved or cleared by the [place] FDA and  has been authorized for detection and/or diagnosis of SARS-CoV-2 by FDA under an Emergency Use Authorization (EUA).  This EUA will remain in effect (meaning this test can be used) f or the duration of  the COVID-19 declaration under Section 564(b)(1) of the Act, 21 U.S.C. section 360bbb-3(b)(1), unless the authorization is terminated or revoked sooner.    Influenza A by PCR NEGATIVE NEGATIVE Final   Influenza B by PCR NEGATIVE NEGATIVE Final    Comment: (NOTE) The Xpert Xpress SARS-CoV-2/FLU/RSV assay is intended as an aid in  the diagnosis of influenza from Nasopharyngeal swab specimens and  should not be used as a sole basis for treatment. Nasal washings and  aspirates are unacceptable for Xpert Xpress SARS-CoV-2/FLU/RSV  testing. Fact Sheet for Patients: https://www.moore.com/ Fact Sheet for Healthcare Providers: https://www.young.biz/ This test is not yet approved or cleared by the [place] FDA and  has been authorized for detection and/or diagnosis of SARS-CoV-2 by  FDA under an Emergency Use Authorization (EUA). This EUA will remain  in effect (meaning this test can be  used) for the duration of the  Covid-19 declaration under Section 564(b)(1) of the Act, 21  U.S.C. section 360bbb-3(b)(1), unless the authorization is  terminated or revoked. Performed at [facility], [address], [place], [place] [address]     Radiology Reports DG Abd 1 View  Result Date: 01/23/2020 CLINICAL DATA:  Check feeding catheter placement EXAM: ABDOMEN - 1 VIEW COMPARISON:  None. FINDINGS: Gastric catheter is noted with the tip in the distal stomach. Scattered large and small bowel gas is noted. IMPRESSION: Gastric catheter within the distal stomach. Electronically Signed   By: Alcide Clever M.D.   On: 01/23/2020 16:38   CT Head Wo Contrast  Result Date: 01/23/2020 CLINICAL DATA:  Encephalopathy, unresponsive EXAM: CT HEAD WITHOUT CONTRAST TECHNIQUE: Contiguous axial images were obtained from the base of the skull through the vertex without intravenous contrast. COMPARISON:  07/30/2010 FINDINGS: Brain: Confluent hypodensities throughout the periventricular white matter are compatible with age-indeterminate small vessel ischemic changes, favor chronic. No other signs of acute infarct or hemorrhage. Lateral ventricles and remaining midline structures are unremarkable. No acute extra-axial fluid collections. No mass effect. Vascular: No hyperdense vessel or unexpected calcification. Skull: Normal. Negative for fracture or focal lesion. Sinuses/Orbits: Mucoperiosteal thickening is seen within the bilateral ethmoid air cells. Other: None IMPRESSION: 1. Likely chronic small-vessel ischemic changes throughout the white matter. 2. No acute intracranial process otherwise. Electronically Signed   By: Sharlet Salina M.D.   On: 01/23/2020  15:02   DG Chest Port 1 View  Result Date: 02/06/2020 CLINICAL DATA:  Cough, short of breath, COPD, asbestos related lung disease, COVID-19 positive EXAM: PORTABLE CHEST 1 VIEW COMPARISON:  01/28/2020 FINDINGS: Single frontal view of the chest  demonstrates marked progression of interstitial and ground-glass opacity seen bilaterally. No effusion or pneumothorax. Cardiac silhouette remains enlarged. No acute bony abnormalities. IMPRESSION: 1. Interval development of bilateral perihilar ground-glass airspace disease, compatible with COVID-19 pneumonia. Electronically Signed   By: Sharlet Salina M.D.   On: 02/06/2020 00:45   DG Chest Port 1 View  Result Date: 01/28/2020 CLINICAL DATA:  Shortness of breath. COPD. Additional history provided by technologist: Wheezing, COVID positive. EXAM: PORTABLE CHEST 1 VIEW COMPARISON:  Chest radiograph 01/23/2020 FINDINGS: Interval extubation.  Overlying cardiac monitoring leads. Unchanged cardiomegaly.  Aortic atherosclerosis. There has been significant interval improvement of apical predominant bilateral pulmonary opacities. Minimal linear atelectasis at the right lung base no evidence of pleural effusion or pneumothorax. No acute bony abnormality. IMPRESSION: Interval extubation. Significant interval improvement of apical predominant bilateral pulmonary opacities. Findings likely reflect sequela of pneumonia given provided history. Minimal linear atelectasis at the right lung base. Electronically Signed   By: Jackey Loge DO   On: 01/28/2020 09:25   DG Chest Portable 1 View  Result Date: 01/23/2020 CLINICAL DATA:  Respiratory failure EXAM: PORTABLE CHEST 1 VIEW COMPARISON:  June 24, 2019 FINDINGS: Endotracheal tube is identified distal tip 6.3 cm from carina. Increased pulmonary interstitium is identified in the bilateral upper lobes and right mid lung. There is no pleural effusion. The aorta is tortuous. The heart size is upper limits are normal. A feeding tube is identified with distal tip not included on film. IMPRESSION: Increased pulmonary interstitium in the bilateral upper lobes and right mid lung, findings are suspicious for developing pneumonias. Electronically Signed   By: Sherian Rein M.D.   On:  01/23/2020 13:59   VAS [place] LOWER EXTREMITY VENOUS (DVT)  Result Date: 01/28/2020  Lower Venous DVTStudy Indications: Elevated Ddimer.  Risk Factors: COVID 19 positive. Comparison Study: No prior studies. Performing Technologist: Chanda Busing RVT  Examination Guidelines: A complete evaluation includes B-mode imaging, spectral Doppler, color Doppler, and power Doppler as needed of all accessible portions of each vessel. Bilateral testing is considered an integral part of a complete examination. Limited examinations for reoccurring indications may be performed as noted. The reflux portion of the exam is performed with the patient in reverse Trendelenburg.  +---------+---------------+---------+-----------+----------+--------------+ RIGHT    CompressibilityPhasicitySpontaneityPropertiesThrombus Aging +---------+---------------+---------+-----------+----------+--------------+ CFV      Full           Yes      Yes                                 +---------+---------------+---------+-----------+----------+--------------+ SFJ      Full                                                        +---------+---------------+---------+-----------+----------+--------------+ FV Prox  Full                                                        +---------+---------------+---------+-----------+----------+--------------+  FV Mid   Full                                                        +---------+---------------+---------+-----------+----------+--------------+ FV DistalFull                                                        +---------+---------------+---------+-----------+----------+--------------+ PFV      Full                                                        +---------+---------------+---------+-----------+----------+--------------+ POP      Full           Yes      Yes                                  +---------+---------------+---------+-----------+----------+--------------+ PTV      Full                                                        +---------+---------------+---------+-----------+----------+--------------+ PERO     Full                                                        +---------+---------------+---------+-----------+----------+--------------+   +---------+---------------+---------+-----------+----------+--------------+ LEFT     CompressibilityPhasicitySpontaneityPropertiesThrombus Aging +---------+---------------+---------+-----------+----------+--------------+ CFV      Full           Yes      Yes                                 +---------+---------------+---------+-----------+----------+--------------+ SFJ      Full                                                        +---------+---------------+---------+-----------+----------+--------------+ FV Prox  Full                                                        +---------+---------------+---------+-----------+----------+--------------+ FV Mid   Full                                                        +---------+---------------+---------+-----------+----------+--------------+  FV DistalFull                                                        +---------+---------------+---------+-----------+----------+--------------+ PFV      Full                                                        +---------+---------------+---------+-----------+----------+--------------+ POP      Full           Yes      Yes                                 +---------+---------------+---------+-----------+----------+--------------+ PTV      Full                                                        +---------+---------------+---------+-----------+----------+--------------+ PERO     Full                                                         +---------+---------------+---------+-----------+----------+--------------+     Summary: RIGHT: - There is no evidence of deep vein thrombosis in the lower extremity.  - No cystic structure found in the popliteal fossa.  LEFT: - There is no evidence of deep vein thrombosis in the lower extremity.  - No cystic structure found in the popliteal fossa.  *See table(s) above for measurements and observations. Electronically signed by Deitra Mayo MD on 01/28/2020 at 10:15:57 AM.    Final    ECHOCARDIOGRAM LIMITED  Result Date: 01/23/2020    ECHOCARDIOGRAM REPORT   Patient Name:   AHMAUD DUTHIE Date of Exam: 01/23/2020 Medical Rec #:  [id number]       Height:       71.0 in Accession #:    [id number]      Weight: Date of Birth:  Jul 24, 1932       BSA: Patient Age:    31 years        BP:           99/74 mmHg Patient Gender: M               HR:           62 bpm. Exam Location:  Inpatient Procedure: Limited Echo, Cardiac Doppler and Limited Color Doppler Indications:    Cardiomegaly 429.3  History:        Patient has no prior history of Echocardiogram examinations.                 CHF, CAD; COPD and Covid.  Sonographer:    Johny Chess Referring Phys: [id number] [place]  1. Left ventricular ejection fraction, by estimation, is 35 to 40%. The left ventricle has moderately decreased function. The left  ventricle demonstrates regional wall motion abnormalities (see scoring diagram/findings for description). The left ventricular internal cavity size was mildly dilated. Left ventricular diastolic parameters are consistent with Grade I diastolic dysfunction (impaired relaxation). There is moderate hypokinesis of the left ventricular, mid-apical anteroseptal wall, anterior wall and apical segment.  2. Right ventricular systolic function is mildly reduced. The right ventricular size is mildly enlarged. There is mildly elevated pulmonary artery systolic pressure.  3. Left atrial size was mildly dilated.   4. The mitral valve is normal in structure and function. No evidence of mitral valve regurgitation.  5. The aortic valve is tricuspid. Aortic valve regurgitation is not visualized. Mild to moderate aortic valve sclerosis/calcification is present, without any evidence of aortic stenosis.  6. The inferior vena cava is dilated in size with <50% respiratory variability, suggesting right atrial pressure of 15 mmHg. This is a nonspecific finding during positive pressure ventilation. FINDINGS  Left Ventricle: Left ventricular ejection fraction, by estimation, is 35 to 40%. The left ventricle has moderately decreased function. The left ventricle demonstrates regional wall motion abnormalities. Moderate hypokinesis of the left ventricular, mid-apical anteroseptal wall, anterior wall and apical segment. The left ventricular internal cavity size was mildly dilated. There is no left ventricular hypertrophy. Left ventricular diastolic parameters are consistent with Grade I diastolic dysfunction (impaired relaxation). Normal left ventricular filling pressure.  LV Wall Scoring: The mid and distal anterior wall, mid anteroseptal segment, and apex are hypokinetic. Right Ventricle: The right ventricular size is mildly enlarged. No increase in right ventricular wall thickness. Right ventricular systolic function is mildly reduced. There is mildly elevated pulmonary artery systolic pressure. The tricuspid regurgitant  velocity is 1.94 m/s, and with an assumed right atrial pressure of 15 mmHg, the estimated right ventricular systolic pressure is 30.1 mmHg. Left Atrium: Left atrial size was mildly dilated. Right Atrium: Right atrial size was not well visualized. Pericardium: There is no evidence of pericardial effusion. Mitral Valve: The mitral valve is normal in structure and function. No evidence of mitral valve regurgitation. Tricuspid Valve: The tricuspid valve is normal in structure. Tricuspid valve regurgitation is not  demonstrated. Aortic Valve: The aortic valve is tricuspid. Aortic valve regurgitation is not visualized. Mild to moderate aortic valve sclerosis/calcification is present, without any evidence of aortic stenosis. Pulmonic Valve: The pulmonic valve was not well visualized. Pulmonic valve regurgitation is not visualized. Aorta: The aortic root is normal in size and structure. Venous: The inferior vena cava is dilated in size with less than 50% respiratory variability, suggesting right atrial pressure of 15 mmHg. IAS/Shunts: No atrial level shunt detected by color flow Doppler.  LEFT VENTRICLE PLAX 2D LVIDd:         5.90 cm  Diastology LVIDs:         4.80 cm  LV e' lateral:   4.13 cm/s LV PW:         0.80 cm  LV E/e' lateral: 11.0 LV IVS:        1.10 cm  LV e' medial:    4.13 cm/s LVOT diam:     2.20 cm  LV E/e' medial:  11.0 LVOT Area:     3.80 cm  LEFT ATRIUM LA diam:    3.40 cm   AORTA Ao Root diam: 3.60 cm MITRAL VALVE               TRICUSPID VALVE MV Area (PHT): 3.60 cm    TR Peak grad:   15.1 mmHg MV Decel Time: 211  msec    TR Vmax:        194.00 cm/s MV E velocity: 45.40 cm/s MV A velocity: 87.40 cm/s  SHUNTS MV E/A ratio:  0.52        Systemic Diam: 2.20 cm Rachelle Hora Croitoru MD Electronically signed by Thurmon Fair MD Signature Date/Time: 01/23/2020/5:37:49 PM    Final     Time Spent in minutes  : No charge   Huey Bienenstock M.D on 02/06/2020 at 4:34 PM  Between 7am to 7pm - Pager - [phone]  After 7pm go to [email or web address] - password [facility]  [facility] -  Office  [phone]

## 2020-02-06 NOTE — Consult Note (Signed)
Palliative Medicine   Name: Jon Baker Date: 02/06/2020 MRN: [id number]  DOB: [birth year]  Patient Care Team: Cassandria Anger, MD as PCP - General (Internal Medicine) [name], [name], MD as Consulting Physician (Internal Medicine) Lorretta Harp, MD as Consulting Physician (Cardiology) Collene Gobble, MD as Consulting Physician (Pulmonary Disease) [name], [name], MD (Internal Medicine)    REASON FOR CONSULTATION: Jon Baker is a 86 y.o. 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  Patient required short-term intubation.  He was ultimately discharged to rehab.  Patient was sent back to the hospital on 02/06/2020 again with hypoxic respiratory failure.  Family opted to keep him comfortable.  Palliative care was consulted to help address goals and manage ongoing symptoms.  SOCIAL HISTORY:     reports that he quit smoking about 32 years ago. His smoking use included cigarettes. He started smoking about 80 years ago. He has a 98.00 pack-year smoking history. He has never used smokeless tobacco. He reports that he does not drink alcohol or use drugs.   Patient is married and lives at home with his wife prior to the recent hospitalization.  He has since been at rehab.  Patient has 2 daughters who are involved in his care.  ADVANCE DIRECTIVES:  Not on file  CODE STATUS: DNR  PAST MEDICAL HISTORY: Past Medical History:  Diagnosis Date   Aortic stenosis    a. Mild by echo 08/2015.    CAD (coronary artery disease)    a. STEMI 05/2015 s/p DES to LAD.   Claudication [facility])    a. 2009, Jon  cardiology, declined ABIs.   COPD (chronic obstructive pulmonary disease) ([place])    states he has  been told by MD he has copd   COPD (chronic obstructive pulmonary disease) ([place])    Depression    DJD (degenerative joint disease)    Dyslipidemia 2016   HTN (hypertension)    Ischemic cardiomyopathy    a. 05/2015: EF 35% at cath, 35-40% by echo. b. EF improved to 55-60% by echo 08/2015.   Pancreatitis, gallstone 05/2013   RBBB    STEMI (ST elevation myocardial infarction) ([place]) 05/20/15   3.0 x 28 mm Promus premier DES to the LAD    PAST SURGICAL HISTORY:  Past Surgical History:  Procedure Laterality Date   CARDIAC CATHETERIZATION N/A 05/20/2015   Procedure: Left Heart Cath and Coronary Angiography;  Surgeon: Sherren Mocha, MD; LAD 99%, circumflex 50%, RCA 40%, EF 35% with akinesis in the mid anterior wall apex and inferior apex    CARDIAC CATHETERIZATION N/A 05/20/2015   Procedure: Coronary Stent Intervention;  Surgeon: Sherren Mocha, MD;  3.0 x 28 mm Promus premier DES to the LAD   CHOLECYSTECTOMY N/A 06/06/2013   Procedure: LAPAROSCOPIC CHOLECYSTECTOMY WITH INTRAOPERATIVE CHOLANGIOGRAM;  Surgeon: Zenovia Jarred, MD;  Location: [facility];  Service: General;  Laterality: N/A;   Glanglian cyst removal Left 1970's   myleogram  1979   TOTAL KNEE ARTHROPLASTY Right 02-2011   Dr Maureen Ralphs    HEMATOLOGY/ONCOLOGY HISTORY:  Oncology History   No history exists.    ALLERGIES:  has No Known Allergies.  MEDICATIONS:  Current Facility-Administered Medications  Medication Dose Route Frequency Provider Last Rate Last Admin  antiseptic oral rinse (BIOTENE) solution 15 mL  15 mL Topical PRN Cardama, Grayce Sessions, MD       glycopyrrolate (ROBINUL) tablet 1 mg  1 mg Oral Q4H PRN Cardama, Grayce Sessions, MD       Or   glycopyrrolate (ROBINUL) injection 0.2 mg  0.2 mg Subcutaneous Q4H PRN Cardama, Grayce Sessions, MD       Or   glycopyrrolate (ROBINUL) injection 0.2 mg  0.2 mg Intravenous Q4H PRN Cardama, Grayce Sessions, MD       LORazepam (ATIVAN) tablet 1 mg  1 mg Oral Q4H PRN  Cardama, Grayce Sessions, MD       Or   LORazepam (ATIVAN) 2 MG/ML concentrated solution 1 mg  1 mg Sublingual Q4H PRN Cardama, Grayce Sessions, MD       Or   LORazepam (ATIVAN) injection 1 mg  1 mg Intravenous Q4H PRN Cardama, Grayce Sessions, MD   1 mg at 02/06/20 1601   morphine 2 MG/ML injection 1 mg  1 mg Intravenous Q2H PRN Fatima Blank, MD   1 mg at 02/06/20 0932   ondansetron (ZOFRAN-ODT) disintegrating tablet 4 mg  4 mg Oral Q6H PRN Cardama, Grayce Sessions, MD       Or   ondansetron [facility]) injection 4 mg  4 mg Intravenous Q6H PRN Cardama, Grayce Sessions, MD       polyvinyl alcohol (LIQUIFILM TEARS) 1.4 % ophthalmic solution 1 drop  1 drop Both Eyes QID PRN Cardama, Grayce Sessions, MD       Current Outpatient Medications  Medication Sig Dispense Refill   albuterol (VENTOLIN HFA) 108 (90 Base) MCG/ACT inhaler Inhale 2 puffs into the lungs every 6 (six) hours as needed for wheezing or shortness of breath.     apixaban (ELIQUIS) 2.5 MG TABS tablet Take 1 tablet (2.5 mg total) by mouth 2 (two) times daily for 13 days. 60 tablet    ascorbic acid (VITAMIN C) 500 MG tablet Take 1,000 mg by mouth daily.     atorvastatin (LIPITOR) 80 MG tablet Take 80 mg by mouth at bedtime.     budesonide (PULMICORT) 0.25 MG/2ML nebulizer solution Take 2 mLs (0.25 mg total) by nebulization 2 (two) times a day. 360 mL 5   carvedilol (COREG) 25 MG tablet Take 1 tablet (25 mg total) by mouth 2 (two) times daily with a meal. 180 tablet 3   cefTRIAXone (ROCEPHIN) IVPB Inject 1 g into the vein daily. For 3 days - started 02/04/20     cholecalciferol (VITAMIN D3) 25 MCG (1000 UNIT) tablet Take 1,000 Units by mouth daily.     clopidogrel (PLAVIX) 75 MG tablet Take 1 tablet (75 mg total) by mouth daily. NEED OV FOR FUTURE REFILL. 90 tablet 0   famotidine (PEPCID) 20 MG tablet Take 20 mg by mouth 2 (two) times daily.     furosemide (LASIX) 80 MG tablet Take 1 tablet (80 mg total) by mouth daily.      guaiFENesin (MUCINEX) 600 MG 12 hr tablet Take 600 mg by mouth daily.     ipratropium-albuterol (DUONEB) 0.5-2.5 (3) MG/3ML SOLN Take 3 mLs by nebulization every 6 (six) hours as needed (shortness of breath/wheezing).     isosorbide-hydrALAZINE (BIDIL) 20-37.5 MG tablet Take 2 tablets by mouth 3 (three) times daily.     predniSONE (DELTASONE) 10 MG tablet TAKE 1 TABLET BY MOUTH EVERY MORNING AFTER MEALS 90 tablet 1   predniSONE (DELTASONE) 5 MG tablet Take 2 tablets (10 mg  total) by mouth daily with breakfast. Label  & dispense according to the schedule below. take 8 Pills PO for 3 days, 6 Pills PO for 3 days, 4 Pills PO for 3 days, 2 Pills PO for 3 days, 1 Pills PO for 3 days, 1/2 Pill  PO for 3 days then STOP. Total 65 pills. 65 tablet 0   spironolactone (ALDACTONE) 25 MG tablet Take 1 tablet (25 mg total) by mouth daily.     zinc gluconate 50 MG tablet Take 50 mg by mouth daily.     amLODipine-valsartan (EXFORGE) 5-160 MG tablet Take 1 tablet by mouth daily. (Patient not taking: Reported on 02/06/2020) 90 tablet 3   [START ON 02/15/2020] aspirin EC 81 MG tablet Take 1 tablet (81 mg total) by mouth daily. (Patient not taking: Reported on 02/06/2020)     nitroGLYCERIN (NITROSTAT) 0.4 MG SL tablet Place 1 tablet (0.4 mg total) under the tongue every 5 (five) minutes as needed for chest pain. 25 tablet 3   pantoprazole (PROTONIX) 40 MG tablet Take 1 tablet (40 mg total) by mouth daily. (Patient not taking: Reported on 02/06/2020) 30 tablet 0   potassium chloride SA (K-DUR) 20 MEQ tablet Take 4 tablets (80 mEq total) by mouth daily. (Patient not taking: Reported on 02/06/2020) 180 tablet 4    VITAL SIGNS: BP 117/65    Pulse 74    Temp 98.2 F (36.8 C) (Oral)    Resp 20    SpO2 92%  There were no vitals filed for this visit.  Estimated body mass index is 32.22 kg/m as calculated from the following:   Height as of 01/23/20: '5\' 11"'$  (1.803 m).   Weight as of 01/30/20: 231 lb 0.7 oz (104.8  kg).  LABS: CBC:    Component Value Date/Time   WBC 14.3 (H) 02/06/2020 0010   HGB 10.8 (L) 02/06/2020 0010   HCT 32.0 (L) 02/06/2020 0010   PLT 168 02/06/2020 0010   MCV 97.0 02/06/2020 0010   NEUTROABS 12.3 (H) 02/06/2020 0010   LYMPHSABS 0.5 (L) 02/06/2020 0010   MONOABS 1.1 (H) 02/06/2020 0010   EOSABS 0.2 02/06/2020 0010   BASOSABS 0.0 02/06/2020 0010   Comprehensive Metabolic Panel:    Component Value Date/Time   NA 136 02/06/2020 0010   NA 140 05/25/2019 1215   K 3.3 (L) 02/06/2020 0010   CL 100 02/06/2020 0010   CO2 27 02/06/2020 0010   BUN 34 (H) 02/06/2020 0010   BUN 15 05/25/2019 1215   CREATININE 1.41 (H) 02/06/2020 0010   CREATININE 1.04 07/29/2014 1553   GLUCOSE 113 (H) 02/06/2020 0010   CALCIUM 7.5 (L) 02/06/2020 0010   AST 48 (H) 02/06/2020 0010   ALT 50 (H) 02/06/2020 0010   ALKPHOS 62 02/06/2020 0010   BILITOT 1.4 (H) 02/06/2020 0010   PROT 4.5 (L) 02/06/2020 0010   ALBUMIN 2.2 (L) 02/06/2020 0010    RADIOGRAPHIC STUDIES: DG Abd 1 View  Result Date: 01/23/2020 CLINICAL DATA:  Check feeding catheter placement EXAM: ABDOMEN - 1 VIEW COMPARISON:  None. FINDINGS: Gastric catheter is noted with the tip in the distal stomach. Scattered large and small bowel gas is noted. IMPRESSION: Gastric catheter within the distal stomach. Electronically Signed   By: Inez Catalina M.D.   On: 01/23/2020 16:38   CT Head Wo Contrast  Result Date: 01/23/2020 CLINICAL DATA:  Encephalopathy, unresponsive EXAM: CT HEAD WITHOUT CONTRAST TECHNIQUE: Contiguous axial images were obtained from the base of the skull through  the vertex without intravenous contrast. COMPARISON:  07/30/2010 FINDINGS: Brain: Confluent hypodensities throughout the periventricular white matter are compatible with age-indeterminate small vessel ischemic changes, favor chronic. No other signs of acute infarct or hemorrhage. Lateral ventricles and remaining midline structures are unremarkable. No acute  extra-axial fluid collections. No mass effect. Vascular: No hyperdense vessel or unexpected calcification. Skull: Normal. Negative for fracture or focal lesion. Sinuses/Orbits: Mucoperiosteal thickening is seen within the bilateral ethmoid air cells. Other: None IMPRESSION: 1. Likely chronic small-vessel ischemic changes throughout the white matter. 2. No acute intracranial process otherwise. Electronically Signed   By: Randa Ngo M.D.   On: 01/23/2020 15:02   DG Chest Port 1 View  Result Date: 02/06/2020 CLINICAL DATA:  Cough, short of breath, COPD, asbestos related lung disease, COVID-19 positive EXAM: PORTABLE CHEST 1 VIEW COMPARISON:  01/28/2020 FINDINGS: Single frontal view of the chest demonstrates marked progression of interstitial and ground-glass opacity seen bilaterally. No effusion or pneumothorax. Cardiac silhouette remains enlarged. No acute bony abnormalities. IMPRESSION: 1. Interval development of bilateral perihilar ground-glass airspace disease, compatible with COVID-19 pneumonia. Electronically Signed   By: Randa Ngo M.D.   On: 02/06/2020 00:45   DG Chest Port 1 View  Result Date: 01/28/2020 CLINICAL DATA:  Shortness of breath. COPD. Additional history provided by technologist: Wheezing, COVID positive. EXAM: PORTABLE CHEST 1 VIEW COMPARISON:  Chest radiograph 01/23/2020 FINDINGS: Interval extubation.  Overlying cardiac monitoring leads. Unchanged cardiomegaly.  Aortic atherosclerosis. There has been significant interval improvement of apical predominant bilateral pulmonary opacities. Minimal linear atelectasis at the right lung base no evidence of pleural effusion or pneumothorax. No acute bony abnormality. IMPRESSION: Interval extubation. Significant interval improvement of apical predominant bilateral pulmonary opacities. Findings likely reflect sequela of pneumonia given provided history. Minimal linear atelectasis at the right lung base. Electronically Signed   By: Kellie Simmering DO   On: 01/28/2020 09:25   DG Chest Portable 1 View  Result Date: 01/23/2020 CLINICAL DATA:  Respiratory failure EXAM: PORTABLE CHEST 1 VIEW COMPARISON:  June 24, 2019 FINDINGS: Endotracheal tube is identified distal tip 6.3 cm from carina. Increased pulmonary interstitium is identified in the bilateral upper lobes and right mid lung. There is no pleural effusion. The aorta is tortuous. The heart size is upper limits are normal. A feeding tube is identified with distal tip not included on film. IMPRESSION: Increased pulmonary interstitium in the bilateral upper lobes and right mid lung, findings are suspicious for developing pneumonias. Electronically Signed   By: Abelardo Diesel M.D.   On: 01/23/2020 13:59   VAS [place] LOWER EXTREMITY VENOUS (DVT)  Result Date: 01/28/2020  Lower Venous DVTStudy Indications: Elevated Ddimer.  Risk Factors: COVID 19 positive. Comparison Study: No prior studies. Performing Technologist: Oliver Hum RVT  Examination Guidelines: A complete evaluation includes B-mode imaging, spectral Doppler, color Doppler, and power Doppler as needed of all accessible portions of each vessel. Bilateral testing is considered an integral part of a complete examination. Limited examinations for reoccurring indications may be performed as noted. The reflux portion of the exam is performed with the patient in reverse Trendelenburg.  +---------+---------------+---------+-----------+----------+--------------+  RIGHT     Compressibility Phasicity Spontaneity Properties Thrombus Aging  +---------+---------------+---------+-----------+----------+--------------+  CFV       Full            Yes       Yes                                    +---------+---------------+---------+-----------+----------+--------------+  SFJ       Full                                                             +---------+---------------+---------+-----------+----------+--------------+  FV Prox   Full                                                              +---------+---------------+---------+-----------+----------+--------------+  FV Mid    Full                                                             +---------+---------------+---------+-----------+----------+--------------+  FV Distal Full                                                             +---------+---------------+---------+-----------+----------+--------------+  PFV       Full                                                             +---------+---------------+---------+-----------+----------+--------------+  POP       Full            Yes       Yes                                    +---------+---------------+---------+-----------+----------+--------------+  PTV       Full                                                             +---------+---------------+---------+-----------+----------+--------------+  PERO      Full                                                             +---------+---------------+---------+-----------+----------+--------------+   +---------+---------------+---------+-----------+----------+--------------+  LEFT      Compressibility Phasicity Spontaneity Properties Thrombus Aging  +---------+---------------+---------+-----------+----------+--------------+  CFV       Full            Yes       Yes                                    +---------+---------------+---------+-----------+----------+--------------+  SFJ       Full                                                             +---------+---------------+---------+-----------+----------+--------------+  FV Prox   Full                                                             +---------+---------------+---------+-----------+----------+--------------+  FV Mid    Full                                                             +---------+---------------+---------+-----------+----------+--------------+  FV Distal Full                                                              +---------+---------------+---------+-----------+----------+--------------+  PFV       Full                                                             +---------+---------------+---------+-----------+----------+--------------+  POP       Full            Yes       Yes                                    +---------+---------------+---------+-----------+----------+--------------+  PTV       Full                                                             +---------+---------------+---------+-----------+----------+--------------+  PERO      Full                                                             +---------+---------------+---------+-----------+----------+--------------+     Summary: RIGHT: - There is no evidence of deep vein thrombosis in the lower extremity.  - No cystic structure found in the popliteal fossa.  LEFT: - There is no evidence of deep vein thrombosis in the lower extremity.  - No cystic structure found in the popliteal fossa.  *See table(s)  above for measurements and observations. Electronically signed by Deitra Mayo MD on 01/28/2020 at 10:15:57 AM.    Final    ECHOCARDIOGRAM LIMITED  Result Date: 01/23/2020    ECHOCARDIOGRAM REPORT   Patient Name:   VALMORE ARABIE Date of Exam: 01/23/2020 Medical Rec #:  [id number]       Height:       71.0 in Accession #:    [id number]      Weight: Date of Birth:  1932/07/28       BSA: Patient Age:    45 years        BP:           99/74 mmHg Patient Gender: M               HR:           62 bpm. Exam Location:  Inpatient Procedure: Limited Echo, Cardiac Doppler and Limited Color Doppler Indications:    Cardiomegaly 429.3  History:        Patient has no prior history of Echocardiogram examinations.                 CHF, CAD; COPD and Covid.  Sonographer:    Johny Chess Referring Phys: [id number] [place]  1. Left ventricular ejection fraction, by estimation, is 35 to 40%. The left ventricle has moderately decreased function. The left  ventricle demonstrates regional wall motion abnormalities (see scoring diagram/findings for description). The left ventricular internal cavity size was mildly dilated. Left ventricular diastolic parameters are consistent with Grade I diastolic dysfunction (impaired relaxation). There is moderate hypokinesis of the left ventricular, mid-apical anteroseptal wall, anterior wall and apical segment.  2. Right ventricular systolic function is mildly reduced. The right ventricular size is mildly enlarged. There is mildly elevated pulmonary artery systolic pressure.  3. Left atrial size was mildly dilated.  4. The mitral valve is normal in structure and function. No evidence of mitral valve regurgitation.  5. The aortic valve is tricuspid. Aortic valve regurgitation is not visualized. Mild to moderate aortic valve sclerosis/calcification is present, without any evidence of aortic stenosis.  6. The inferior vena cava is dilated in size with <50% respiratory variability, suggesting right atrial pressure of 15 mmHg. This is a nonspecific finding during positive pressure ventilation. FINDINGS  Left Ventricle: Left ventricular ejection fraction, by estimation, is 35 to 40%. The left ventricle has moderately decreased function. The left ventricle demonstrates regional wall motion abnormalities. Moderate hypokinesis of the left ventricular, mid-apical anteroseptal wall, anterior wall and apical segment. The left ventricular internal cavity size was mildly dilated. There is no left ventricular hypertrophy. Left ventricular diastolic parameters are consistent with Grade I diastolic dysfunction (impaired relaxation). Normal left ventricular filling pressure.  LV Wall Scoring: The mid and distal anterior wall, mid anteroseptal segment, and apex are hypokinetic. Right Ventricle: The right ventricular size is mildly enlarged. No increase in right ventricular wall thickness. Right ventricular systolic function is mildly reduced. There is  mildly elevated pulmonary artery systolic pressure. The tricuspid regurgitant  velocity is 1.94 m/s, and with an assumed right atrial pressure of 15 mmHg, the estimated right ventricular systolic pressure is 62.9 mmHg. Left Atrium: Left atrial size was mildly dilated. Right Atrium: Right atrial size was not well visualized. Pericardium: There is no evidence of pericardial effusion. Mitral Valve: The mitral valve is normal in structure and function. No evidence of mitral valve regurgitation. Tricuspid Valve: The tricuspid valve is normal in structure. Tricuspid valve  regurgitation is not demonstrated. Aortic Valve: The aortic valve is tricuspid. Aortic valve regurgitation is not visualized. Mild to moderate aortic valve sclerosis/calcification is present, without any evidence of aortic stenosis. Pulmonic Valve: The pulmonic valve was not well visualized. Pulmonic valve regurgitation is not visualized. Aorta: The aortic root is normal in size and structure. Venous: The inferior vena cava is dilated in size with less than 50% respiratory variability, suggesting right atrial pressure of 15 mmHg. IAS/Shunts: No atrial level shunt detected by color flow Doppler.  LEFT VENTRICLE PLAX 2D LVIDd:         5.90 cm  Diastology LVIDs:         4.80 cm  LV e' lateral:   4.13 cm/s LV PW:         0.80 cm  LV E/e' lateral: 11.0 LV IVS:        1.10 cm  LV e' medial:    4.13 cm/s LVOT diam:     2.20 cm  LV E/e' medial:  11.0 LVOT Area:     3.80 cm  LEFT ATRIUM LA diam:    3.40 cm   AORTA Ao Root diam: 3.60 cm MITRAL VALVE               TRICUSPID VALVE MV Area (PHT): 3.60 cm    TR Peak grad:   15.1 mmHg MV Decel Time: 211 msec    TR Vmax:        194.00 cm/s MV E velocity: 45.40 cm/s MV A velocity: 87.40 cm/s  SHUNTS MV E/A ratio:  0.52        Systemic Diam: 2.20 cm Mihai Croitoru MD Electronically signed by Sanda Klein MD Signature Date/Time: 01/23/2020/5:37:49 PM    Final     PERFORMANCE STATUS (ECOG) : 4 - Bedbound  Review of  Systems Unable to provide  Physical Exam General: Critically ill-appearing Pulmonary: Unlabored Extremities: no edema, no joint deformities Skin: no rashes Neurological: Poorly responsive  IMPRESSION: Patient is poorly responsive.  He is relatively comfortable appearing.  His daughter is at bedside.  I met with patient's daughter, Margaretha Sheffield.  She confirmed plan is for comfort care only.  She recognizes the patient is likely at end-of-life.  Patient's disposition has been unclear.  Family are interested in hospice involvement and would prefer residential hospice at [facility] but patient would not be accepted at that facility if he is COVID-19 positive.  Patient would be accepted at the hospice facility in [place] but family do not want him transferred there due to the distance.    A rapid COVID-19 test is pending.  Family do not want him hospitalized here due to the restricted visitation policy in the setting of COVID-19.  Family stated they would ultimately take him home with hospice if the rapid COVID-19 test comes back positive.  I also called and spoke with his other daughter, Ebony Hail, by phone.  She also confirmed the plan and goals as outlined by her sister.  I called and spoke with the hospice liaison and hospitalist.  PLAN: -Comfort care -Residential hospice versus home with hospice dependent upon results of COVID-19 test -COVID-19 test is pending -Comfort care orders reviewed -DNR/DNI  Time Total: 45 minutes  Visit consisted of counseling and education dealing with the complex and emotionally intense issues of symptom management and palliative care in the setting of serious and potentially life-threatening illness.Greater than 50%  of this time was spent counseling and coordinating care related to the above assessment  and plan.  Signed by: Altha Harm, PhD, NP-C

## 2020-02-06 NOTE — ED Provider Notes (Signed)
Attestation: Medical screening examination/treatment/procedure(s) were conducted as a shared visit with non-physician practitioner(s) and myself.  I personally evaluated the patient during the encounter. Briefly, the patient is a 86 y.o. male with h/o COPD, CAD status post STEMI and stenting, cardiomyopathy with heart failure, recently diagnosed with Covid, here for hypoxia.  Patient presents from facility.  Family is requesting comfort measures.  Vitals:   02/06/20 0015 02/06/20 0045  BP: (!) 88/68 (!) 94/58  Pulse: (!) 48 (!) 46  Resp: 20 20  Temp:    SpO2: 98% 95%    CONSTITUTIONAL: Ill-appearing,  NEURO:  Alert but disoriented no focal deficits EYES:  pupils equal and reactive ENT/NECK:  trachea midline, no JVD CARDIO: Bradycardia rate, regular rhythm, well-perfused PULM:  labored breathing GI/GU:  Abdomin non-distended MSK/SPINE:  No gross deformities, no edema SKIN:  no rash, atraumatic   Chest x-ray consistent with COVID-19 pneumonia.  Patient is increasingly hypoxic with hypotension.  Family is requesting comfort measures.    EKG: EKG Interpretation  Date/Time:  Sunday February 06 2020 00:03:16 EST Ventricular Rate:  86 PR Interval:    QRS Duration: 163 QT Interval:  618 QTC Calculation: 564 R Axis:   47 Text Interpretation: Sinus rhythm Multiple ventricular premature complexes Probable left atrial enlargement Right bundle branch block Artifact Poor data quality in current ECG precludes serial comparison Confirmed by Drema Pry [phone]) on 02/06/2020 12:22:54 AM             Nira Conn, MD 02/06/20 [phone]

## 2020-02-06 NOTE — Progress Notes (Signed)
Jon Baker [id number] Admission Data: 02/06/2020 6:52 PM Attending Provider: Starleen Arms, MD  OBS:[id number], Georgina Quint, MD Consults/ Treatment Team:   Jon Baker is a 86 y.o. male patient admitted from ED awake, alert  & orientated  X 3,  DNR, VSS - Blood pressure (!) 147/88, pulse 71, temperature 98.6 F (37 C), temperature source Axillary, resp. rate (!) 27, SpO2 92 %., O2    NRB r,  IV site WDL:  hand right, condition patent and no redness with a transparent dsg that's clean dry and intact.  Allergies:  No Known Allergies   Past Medical History:  Diagnosis Date  . Aortic stenosis    a. Mild by echo 08/2015.   [name] CAD (coronary artery disease)    a. STEMI 05/2015 s/p DES to LAD.  [name] [facility])    a. 2009, saw  cardiology, declined ABIs.  [name] COPD (chronic obstructive pulmonary disease) (HCC)    states he has been told by MD he has copd  . COPD (chronic obstructive pulmonary disease) (HCC)   . Depression   . DJD (degenerative joint disease)   . Dyslipidemia 2016  . HTN (hypertension)   . Ischemic cardiomyopathy    a. 05/2015: EF 35% at cath, 35-40% by echo. b. EF improved to 55-60% by echo 08/2015.  [name] Pancreatitis, gallstone 05/2013  . RBBB   . STEMI (ST elevation myocardial infarction) (HCC) 05/20/15   3.0 x 28 mm Promus premier DES to the LAD     Pt orientation to unit, room and routine. Information packet given to patient/family and safety video watched.  Admission INP armband ID verified with patient/family, and in place. SR up x 2, fall risk assessment complete with Patient and family verbalizing understanding of risks associated with falls. Pt verbalizes an understanding of how to use the call bell and to call for help before getting out of bed. Bruising n left hip, with generalize bilateral arm ecchymosis noted.  Will cont to monitor and assist as needed.  Phineas Douglas, [place] 02/06/2020 6:52 PM

## 2020-02-06 NOTE — ED Notes (Addendum)
Pt came to the ED via EMS. Pt conscious, breathing, and A&Ox3. Pt brought back to [facility] via stretcher. EMS endorses "Pt is COVID positive and had become hypoxic at the SNF". Chest rise and fall equally with labored breathing. Lungs have rhonchi apex to base. Abd soft and non-tender. Pt denies chest pain, n/v/d, shortness of breath, and f/c. PIVC placed on the left forearm with a 20G which had positive blood return and flushed without pain or infiltration. Blood collected, labeled, and sent to lab. Bed in lowest position with call light within reach. Pt on continuous blood pressure, pulse ox, and cardiac monitor. Will continue to monitor. Awaiting MD eval. No distress noted.

## 2020-02-06 NOTE — ED Notes (Signed)
RN to MD phone call with Loney Loh in which she stated "Pt can stay down in the ED until 7am until palliative care and case manager can see the pt". Charge RN made aware. Family notified.

## 2020-02-06 NOTE — ED Notes (Signed)
5W charge RN made aware of updated plan of care.

## 2020-02-06 NOTE — ED Notes (Signed)
Pt resting comfortably in bed. No distress noted. Daughter at bedside.

## 2020-02-06 NOTE — Progress Notes (Addendum)
COVID-19 test is positive. I spoke with patient's daughter. She confirmed that family would like to take him home with hospice. I spoke with the hospice liaison who will coordinate discharge.   I spoke with Dr. Randol Kern. I would recommend that patient be discharged home with Rx for morphine elixir (20mg /mL) 5mg  (0.18mL) Q1-2H as needed for pain/dyspnea (#44mL) and lorazepam 0.5mg  Q4H as needed for anxiety/agitation.   Time Total: 10 minutes  Visit consisted of counseling and education dealing with the complex and emotionally intense issues of symptom management and palliative care in the setting of serious and potentially life-threatening illness.Greater than 50%  of this time was spent counseling and coordinating care related to the above assessment and plan.  Signed by: 32m, PhD, NP-C

## 2020-02-07 DIAGNOSIS — J1282 Pneumonia due to coronavirus disease 2019: Secondary | ICD-10-CM

## 2020-02-07 DIAGNOSIS — Z515 Encounter for palliative care: Secondary | ICD-10-CM

## 2020-02-07 DIAGNOSIS — Z7189 Other specified counseling: Secondary | ICD-10-CM

## 2020-02-07 DIAGNOSIS — J9621 Acute and chronic respiratory failure with hypoxia: Secondary | ICD-10-CM

## 2020-02-07 DIAGNOSIS — U071 COVID-19: Secondary | ICD-10-CM

## 2020-02-07 MED ORDER — MORPHINE BOLUS VIA INFUSION
2.0000 mg | INTRAVENOUS | Status: DC | PRN
  Filled 2020-02-07: qty 2

## 2020-02-07 MED ORDER — BIOTENE DRY MOUTH MT LIQD: 15.0000 mL | OROMUCOSAL | Status: DC | PRN

## 2020-02-07 MED ORDER — LORAZEPAM 2 MG/ML IJ SOLN
2.0000 mg | INTRAMUSCULAR | Status: DC
  Administered 2020-02-07 (×3): 2 mg via INTRAVENOUS
  Filled 2020-02-07 (×2): qty 1

## 2020-02-07 MED ORDER — MORPHINE 100MG IN NS 100ML (1MG/ML) PREMIX INFUSION
1.0000 mg/h | INTRAVENOUS | Status: DC
  Administered 2020-02-07: 1 mg/h via INTRAVENOUS
  Filled 2020-02-07: qty 100

## 2020-02-07 MED ORDER — HALOPERIDOL 0.5 MG PO TABS
0.5000 mg | ORAL_TABLET | ORAL | Status: DC | PRN
  Filled 2020-02-07: qty 1

## 2020-02-07 MED ORDER — HALOPERIDOL LACTATE 2 MG/ML PO CONC
0.5000 mg | ORAL | Status: DC | PRN
  Filled 2020-02-07: qty 0.3

## 2020-02-07 MED ORDER — HALOPERIDOL LACTATE 5 MG/ML IJ SOLN
0.5000 mg | INTRAMUSCULAR | Status: DC | PRN
  Administered 2020-02-07: 0.5 mg via INTRAVENOUS
  Filled 2020-02-07: qty 1

## 2020-02-07 DEATH — deceased

## 2020-02-25 ENCOUNTER — Inpatient Hospital Stay: Payer: Medicare Other | Admitting: Emergency Medicine

## 2020-03-09 NOTE — Progress Notes (Addendum)
While rouunding on patient shortly after Apr 01, 2229, patient was observed to not be breathing.  Heart rhythm checked and assessed to be asystole.  2 RN verification of death with Everitt Amber. at [birth year].  Paged hospitalist to notify of expected death of patient.  After hospitalist was paged, patient's daughter called and was notified of patient death.    [facility] notified.  Determined that patient is not a donor candidate. Notified family's choice of funeral home that patient had passed.  Post mortem care done.  Waiting for death certificate to be completed to send patient to morgue.

## 2020-03-09 NOTE — Progress Notes (Signed)
Nutrition Brief Note  Chart reviewed. Pt now transitioning to comfort care.  No further nutrition interventions warranted at this time.  Please re-consult as needed.   Cove Haydon W, RD, LDN, CDCES Registered Dietitian II Certified Diabetes Care and Education Specialist Please refer to [facility] for RD and/or RD on-call/weekend/after hours pager  

## 2020-03-09 NOTE — Progress Notes (Signed)
Daily Progress Note   Patient Name: Jon Baker       Date: February 10, 2020 DOB: [birth year]  Age: 86 y.o. MRN#: [id number] Attending Physician: Starleen Arms, MD Primary Care Physician: [name], MD Admit Date: 01/10/2020  Reason for Consultation/Follow-up: Establishing goals of care, Non pain symptom management and Terminal Care  Subjective: Evaluated patient for symptom management. Patient did not answer any questions verbally. Opened his eyes and moaned. His respirations and work of breathing were significantly increased.  I called his daughter- Revonda Standard- provided support as we discussed his care and disposition. Has been hard for family since they have not seen him while he was at [facility]- he did not progress there. Difficult place for her due to her mother also home with Covid. Revonda Standard confirmed that main goal is for patient to be comfortable and die peacefully. She is trying to figure best time to visit. She discussed her wish for him to remain alert for her visit. I gently explained there may a difficult balance in keeping him awake and also keeping him comfortable. She agrees with starting morphine infusion. I also discussed with her that her Dad was agitated last night- pulling off oxygen and mitts were placed. I recommend using medication for comfort rather than forcing discomfort with continued attempts at oxygen and placement of mitts.   Review of Systems  Unable to perform ROS: Acuity of condition    Length of Stay: 1  Current Medications: Scheduled Meds:  . LORazepam  2 mg Intravenous Q4H    Continuous Infusions: . morphine      PRN Meds: antiseptic oral rinse, glycopyrrolate **OR** glycopyrrolate **OR** glycopyrrolate, haloperidol **OR** haloperidol  **OR** haloperidol lactate, morphine injection, morphine, ondansetron **OR** ondansetron (ZOFRAN) IV, polyvinyl alcohol  Physical Exam Vitals and nursing note reviewed.  Constitutional:      General: He is in acute distress.     Appearance: He is ill-appearing.  Pulmonary:     Comments: Increased RR and increased effort Neurological:     Comments: Not able to answer questions             Vital Signs: BP (!) 161/80 (BP Location: Right Arm)   Pulse 73   Temp 98.1 F (36.7 C) (Oral)   Resp (!) 22   SpO2 93%  SpO2: SpO2: 93 %  O2 Device: O2 Device: High Flow Nasal Cannula O2 Flow Rate: O2 Flow Rate (L/min): 8 L/min  Intake/output summary:   Intake/Output Summary (Last 24 hours) at 03/01/2020 1134 Last data filed at 03/04/2020 0630 Gross per 24 hour  Intake -  Output 400 ml  Net -400 ml   LBM: Last BM Date: 02/06/20(PTA) Baseline Weight:   Most recent weight:         Palliative Assessment/Data: PPS: 10%      Patient Active Problem List   Diagnosis Date Noted  . End of life care 02/06/2020  . Acute on chronic respiratory failure with hypoxia ([facility]) 02/06/2020  . Palliative care encounter   . Acute respiratory failure ([place])   . Pneumonia due to COVID-19 virus   . Chronic hypoxemic respiratory failure ([place])   . Pressure injury of skin 01/25/2020  . Pneumonia 01/23/2020  . Chronic systolic heart failure ([place]) 06/09/2019  . Chronic fatigue 06/24/2018  . Cough 02/17/2018  . Constipation 12/24/2017  . Chronic respiratory failure with hypoxia ([place]) 11/25/2017  . CHF (congestive heart failure), NYHA class II ([place]) 11/11/2017  . ILD (interstitial lung disease) ([place]) 08/28/2017  . Nocturnal hypoxia 08/28/2017  . Hypoxemia 08/06/2017  . Edema 07/09/2017  . Dyspnea 07/09/2017  . Carrier of alpha-1-antitrypsin deficiency 04/03/2017  . Asbestos exposure 02/12/2017  . Actinic keratosis 11/05/2016  . GERD (gastroesophageal reflux disease) 10/08/2016  . Well adult exam  06/28/2015  . Bladder neck obstruction 06/28/2015  . Allergic rhinitis 06/28/2015  . CAD (coronary artery disease)   . Ischemic cardiomyopathy   . Dyslipidemia   . STEMI (ST elevation myocardial infarction) ([place]) 05/20/2015  . Neck pain 03/31/2015  . Paresthesia 03/31/2015  . Allergic reaction 08/31/2014  . Folliculitis 65/02/5464  . Depression 06/15/2013  . Gallstones 06/04/2013  . Advanced COPD ([place]) 01/06/2013  . DEGENERATIVE JOINT DISEASE 12/24/2010  . DIZZINESS, "imbalance" 07/30/2010  . HYPERLIPIDEMIA 01/07/2008  . HYPERPLASIA PROSTATE UNS W/UR OBST & OTH LUTS 01/07/2008  . Essential hypertension 01/07/2008    Palliative Care Assessment & Plan   Patient Profile: Jon Baker is a 86 y.o. 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  Patient required short-term intubation.  He was ultimately discharged to rehab.  Patient was sent back to the hospital on 02/06/2020 again with hypoxic respiratory failure.  Family opted to keep him comfortable.  Palliative care was consulted to help address goals and manage ongoing symptoms.  Assessment/Recommendations/Plan   SOB, comfort care- start morphine infusion 1mg /hr with 2mg  q15 min prn bolus for comfort  Do not check oxygen saturation- give morphine bolus or lorazepam for agitation, discomfort, dyspnea  Family arranging time for visit with nursing unit  Goals of Care and Additional Recommendations:  Limitations on Scope of Treatment: Full Comfort Care  Code Status:  DNR  Prognosis:   Hours - Days  Discharge Planning:  Anticipated Hospital Death  Care plan was discussed with patient's nurse and daughterEbony Hail.  Thank you for allowing the  Palliative Medicine Team to assist in the care of this patient.   Time In: 1030 Time Out: 1130 Total Time 60 mins Prolonged Time Billed yes      Greater than 50%  of this time was spent counseling and coordinating care related  to the above assessment and plan.  Mariana Kaufman, AGNP-C Palliative Medicine   Please contact Palliative Medicine Team phone at [phone] for questions and concerns.

## 2020-03-09 NOTE — Death Summary Note (Signed)
DEATH SUMMARY   Patient Details  Name: Jon Baker MRN: [id number] DOB: 1932-11-21  Admission/Discharge Information   Admit Date:  2020-02-24  Date of Death: Date of Death: 02/26/20  Time of Death: Time of Death: 04/14/2234  Length of Stay: 2  Referring Physician: [name], MD   Reason(s) for Hospitalization    End-of-life care - palliative medicine consulted, initiated on morphine drip, with boluses as needed,Ativan as needed, Glycopyrrolate as needed for excessive secretions  Acute on chronic hypoxic respiratory failure secondary to multifocal pneumonia, recent COVID-19 diagnosis -Full comfort care as above  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 -Fullcomfort care as above  CAD with history of STEMI status post PCI -Full comfort care as above  Chronic combined CHF -Full comfort care as above  Diagnoses  Preliminary cause of death:   -COVID-19 infection  Secondary Diagnoses (including complications and co-morbidities):  Principal Problem:   End of life care Active Problems:   Advanced COPD (HCC)   CAD (coronary artery disease)   Pneumonia due to COVID-19 virus   Acute on chronic respiratory failure with hypoxia [facility])   Terminal care   Palliative care by specialist   Goals of care, counseling/discussion   Comfort measures only status   Brief Hospital Course (including significant findings, care, treatment, and services provided and events leading to death)  Jon Baker is a 86 y.o. year old male with  medical history significant ofadvanced COPDandILD from asbestosis related lung disease on chronic steroids, chronic respiratory failure on 3 L home oxygen at night, chronic combined CHF with EF 35 to 40%, CAD with history of STEMI status post PCI, CKD 3, hypertension, hyperlipidemia being brought to the ED by EMS from his SNF for evaluation of hypoxia. His oxygen saturation was 85% on 4 L supplemental oxygen at his  SNF. Patient was recently admitted to the hospital 01/23/2020-02/01/2020 for acute on chronic hypoxic respiratory failure secondary to COVID-19 pneumonia and acute on chronic CHF with EF 35-40% superimposed on his advanced COPD and ILD. He was intubated and admitted to the ICU. He was brought into the ER unresponsive and had a fever. He was intubated in the ER and admitted to the ICU. Required pressors. He was treated for COVID-19 and diuresed with IV Lasix. His D-dimer was elevated during this hospitalization and he did not receive chemical prophylaxis due to hematuria while he was admitted in the ICU. Lower extremity Dopplersnegative for DVT. Since his D-dimer was still quite high, he was placed on prophylactic dose Eliquis with plan to continue it for 2 weeks. Discharged to SNF on prednisone 10 mg daily which she has been taking chronically.  Patient did not have decision-making capacity due to his altered mental status. ED provider had a conversation with the patient's daughter who expressed that the patient is DNR and DNI.  She did confirm patient is a comfort care, no invasive measures, and to continue with comfort care, so patient was admitted to the hospital for comfort care measures, especially he was anticipated to acquire IV comfort medications, palliative medicine were consulted, to assist with disposition, patient was admitted to medical floor with full comfort measures.  Management per palliative medicine, he was started on morphine drip for comsfort, , family aware allowed comfort care visitation 3/1, patient passed away 2020/02/26   Pertinent Labs and Studies  Significant Diagnostic Studies DG Abd 1 View  Result Date: 01/23/2020 CLINICAL DATA:  Check feeding catheter placement EXAM: ABDOMEN - 1 VIEW  COMPARISON:  None. FINDINGS: Gastric catheter is noted with the tip in the distal stomach. Scattered large and small bowel gas is noted. IMPRESSION: Gastric catheter within the distal  stomach. Electronically Signed   By: Alcide Clever M.D.   On: 01/23/2020 16:38   CT Head Wo Contrast  Result Date: 01/23/2020 CLINICAL DATA:  Encephalopathy, unresponsive EXAM: CT HEAD WITHOUT CONTRAST TECHNIQUE: Contiguous axial images were obtained from the base of the skull through the vertex without intravenous contrast. COMPARISON:  07/30/2010 FINDINGS: Brain: Confluent hypodensities throughout the periventricular white matter are compatible with age-indeterminate small vessel ischemic changes, favor chronic. No other signs of acute infarct or hemorrhage. Lateral ventricles and remaining midline structures are unremarkable. No acute extra-axial fluid collections. No mass effect. Vascular: No hyperdense vessel or unexpected calcification. Skull: Normal. Negative for fracture or focal lesion. Sinuses/Orbits: Mucoperiosteal thickening is seen within the bilateral ethmoid air cells. Other: None IMPRESSION: 1. Likely chronic small-vessel ischemic changes throughout the white matter. 2. No acute intracranial process otherwise. Electronically Signed   By: Sharlet Salina M.D.   On: 01/23/2020 15:02   DG Chest Port 1 View  Result Date: 02/06/2020 CLINICAL DATA:  Cough, short of breath, COPD, asbestos related lung disease, COVID-19 positive EXAM: PORTABLE CHEST 1 VIEW COMPARISON:  01/28/2020 FINDINGS: Single frontal view of the chest demonstrates marked progression of interstitial and ground-glass opacity seen bilaterally. No effusion or pneumothorax. Cardiac silhouette remains enlarged. No acute bony abnormalities. IMPRESSION: 1. Interval development of bilateral perihilar ground-glass airspace disease, compatible with COVID-19 pneumonia. Electronically Signed   By: Sharlet Salina M.D.   On: 02/06/2020 00:45   DG Chest Port 1 View  Result Date: 01/28/2020 CLINICAL DATA:  Shortness of breath. COPD. Additional history provided by technologist: Wheezing, COVID positive. EXAM: PORTABLE CHEST 1 VIEW COMPARISON:   Chest radiograph 01/23/2020 FINDINGS: Interval extubation.  Overlying cardiac monitoring leads. Unchanged cardiomegaly.  Aortic atherosclerosis. There has been significant interval improvement of apical predominant bilateral pulmonary opacities. Minimal linear atelectasis at the right lung base no evidence of pleural effusion or pneumothorax. No acute bony abnormality. IMPRESSION: Interval extubation. Significant interval improvement of apical predominant bilateral pulmonary opacities. Findings likely reflect sequela of pneumonia given provided history. Minimal linear atelectasis at the right lung base. Electronically Signed   By: Jackey Loge DO   On: 01/28/2020 09:25   DG Chest Portable 1 View  Result Date: 01/23/2020 CLINICAL DATA:  Respiratory failure EXAM: PORTABLE CHEST 1 VIEW COMPARISON:  June 24, 2019 FINDINGS: Endotracheal tube is identified distal tip 6.3 cm from carina. Increased pulmonary interstitium is identified in the bilateral upper lobes and right mid lung. There is no pleural effusion. The aorta is tortuous. The heart size is upper limits are normal. A feeding tube is identified with distal tip not included on film. IMPRESSION: Increased pulmonary interstitium in the bilateral upper lobes and right mid lung, findings are suspicious for developing pneumonias. Electronically Signed   By: Sherian Rein M.D.   On: 01/23/2020 13:59   VAS [place] LOWER EXTREMITY VENOUS (DVT)  Result Date: 01/28/2020  Lower Venous DVTStudy Indications: Elevated Ddimer.  Risk Factors: COVID 19 positive. Comparison Study: No prior studies. Performing Technologist: Chanda Busing RVT  Examination Guidelines: A complete evaluation includes B-mode imaging, spectral Doppler, color Doppler, and power Doppler as needed of all accessible portions of each vessel. Bilateral testing is considered an integral part of a complete examination. Limited examinations for reoccurring indications may be performed as noted. The reflux  portion  of the exam is performed with the patient in reverse Trendelenburg.  +---------+---------------+---------+-----------+----------+--------------+ RIGHT    CompressibilityPhasicitySpontaneityPropertiesThrombus Aging +---------+---------------+---------+-----------+----------+--------------+ CFV      Full           Yes      Yes                                 +---------+---------------+---------+-----------+----------+--------------+ SFJ      Full                                                        +---------+---------------+---------+-----------+----------+--------------+ FV Prox  Full                                                        +---------+---------------+---------+-----------+----------+--------------+ FV Mid   Full                                                        +---------+---------------+---------+-----------+----------+--------------+ FV DistalFull                                                        +---------+---------------+---------+-----------+----------+--------------+ PFV      Full                                                        +---------+---------------+---------+-----------+----------+--------------+ POP      Full           Yes      Yes                                 +---------+---------------+---------+-----------+----------+--------------+ PTV      Full                                                        +---------+---------------+---------+-----------+----------+--------------+ PERO     Full                                                        +---------+---------------+---------+-----------+----------+--------------+   +---------+---------------+---------+-----------+----------+--------------+ LEFT     CompressibilityPhasicitySpontaneityPropertiesThrombus Aging +---------+---------------+---------+-----------+----------+--------------+ CFV      Full           Yes      Yes                                  +---------+---------------+---------+-----------+----------+--------------+  SFJ      Full                                                        +---------+---------------+---------+-----------+----------+--------------+ FV Prox  Full                                                        +---------+---------------+---------+-----------+----------+--------------+ FV Mid   Full                                                        +---------+---------------+---------+-----------+----------+--------------+ FV DistalFull                                                        +---------+---------------+---------+-----------+----------+--------------+ PFV      Full                                                        +---------+---------------+---------+-----------+----------+--------------+ POP      Full           Yes      Yes                                 +---------+---------------+---------+-----------+----------+--------------+ PTV      Full                                                        +---------+---------------+---------+-----------+----------+--------------+ PERO     Full                                                        +---------+---------------+---------+-----------+----------+--------------+     Summary: RIGHT: - There is no evidence of deep vein thrombosis in the lower extremity.  - No cystic structure found in the popliteal fossa.  LEFT: - There is no evidence of deep vein thrombosis in the lower extremity.  - No cystic structure found in the popliteal fossa.  *See table(s) above for measurements and observations. Electronically signed by Waverly Ferrari MD on 01/28/2020 at 10:15:57 AM.    Final    ECHOCARDIOGRAM LIMITED  Result Date: 01/23/2020    ECHOCARDIOGRAM REPORT   Patient Name:   EYDEN DOBIE Date of Exam: 01/23/2020 Medical Rec #:  [id number]  Height:       71.0 in Accession #:     [id number]      Weight: Date of Birth:  08-May-1932       BSA: Patient Age:    87 years        BP:           99/74 mmHg Patient Gender: M               HR:           62 bpm. Exam Location:  Inpatient Procedure: Limited Echo, Cardiac Doppler and Limited Color Doppler Indications:    Cardiomegaly 429.3  History:        Patient has no prior history of Echocardiogram examinations.                 CHF, CAD; COPD and Covid.  Sonographer:    Delcie Roch Referring Phys: [id number] ADEWALE A OLALERE IMPRESSIONS  1. Left ventricular ejection fraction, by estimation, is 35 to 40%. The left ventricle has moderately decreased function. The left ventricle demonstrates regional wall motion abnormalities (see scoring diagram/findings for description). The left ventricular internal cavity size was mildly dilated. Left ventricular diastolic parameters are consistent with Grade I diastolic dysfunction (impaired relaxation). There is moderate hypokinesis of the left ventricular, mid-apical anteroseptal wall, anterior wall and apical segment.  2. Right ventricular systolic function is mildly reduced. The right ventricular size is mildly enlarged. There is mildly elevated pulmonary artery systolic pressure.  3. Left atrial size was mildly dilated.  4. The mitral valve is normal in structure and function. No evidence of mitral valve regurgitation.  5. The aortic valve is tricuspid. Aortic valve regurgitation is not visualized. Mild to moderate aortic valve sclerosis/calcification is present, without any evidence of aortic stenosis.  6. The inferior vena cava is dilated in size with <50% respiratory variability, suggesting right atrial pressure of 15 mmHg. This is a nonspecific finding during positive pressure ventilation. FINDINGS  Left Ventricle: Left ventricular ejection fraction, by estimation, is 35 to 40%. The left ventricle has moderately decreased function. The left ventricle demonstrates regional wall motion abnormalities.  Moderate hypokinesis of the left ventricular, mid-apical anteroseptal wall, anterior wall and apical segment. The left ventricular internal cavity size was mildly dilated. There is no left ventricular hypertrophy. Left ventricular diastolic parameters are consistent with Grade I diastolic dysfunction (impaired relaxation). Normal left ventricular filling pressure.  LV Wall Scoring: The mid and distal anterior wall, mid anteroseptal segment, and apex are hypokinetic. Right Ventricle: The right ventricular size is mildly enlarged. No increase in right ventricular wall thickness. Right ventricular systolic function is mildly reduced. There is mildly elevated pulmonary artery systolic pressure. The tricuspid regurgitant  velocity is 1.94 m/s, and with an assumed right atrial pressure of 15 mmHg, the estimated right ventricular systolic pressure is 30.1 mmHg. Left Atrium: Left atrial size was mildly dilated. Right Atrium: Right atrial size was not well visualized. Pericardium: There is no evidence of pericardial effusion. Mitral Valve: The mitral valve is normal in structure and function. No evidence of mitral valve regurgitation. Tricuspid Valve: The tricuspid valve is normal in structure. Tricuspid valve regurgitation is not demonstrated. Aortic Valve: The aortic valve is tricuspid. Aortic valve regurgitation is not visualized. Mild to moderate aortic valve sclerosis/calcification is present, without any evidence of aortic stenosis. Pulmonic Valve: The pulmonic valve was not well visualized. Pulmonic valve regurgitation is not visualized. Aorta: The aortic root is normal in size and structure. Venous:  The inferior vena cava is dilated in size with less than 50% respiratory variability, suggesting right atrial pressure of 15 mmHg. IAS/Shunts: No atrial level shunt detected by color flow Doppler.  LEFT VENTRICLE PLAX 2D LVIDd:         5.90 cm  Diastology LVIDs:         4.80 cm  LV e' lateral:   4.13 cm/s LV PW:          0.80 cm  LV E/e' lateral: 11.0 LV IVS:        1.10 cm  LV e' medial:    4.13 cm/s LVOT diam:     2.20 cm  LV E/e' medial:  11.0 LVOT Area:     3.80 cm  LEFT ATRIUM LA diam:    3.40 cm   AORTA Ao Root diam: 3.60 cm MITRAL VALVE               TRICUSPID VALVE MV Area (PHT): 3.60 cm    TR Peak grad:   15.1 mmHg MV Decel Time: 211 msec    TR Vmax:        194.00 cm/s MV E velocity: 45.40 cm/s MV A velocity: 87.40 cm/s  SHUNTS MV E/A ratio:  0.52        Systemic Diam: 2.20 cm Mihai Croitoru MD Electronically signed by Sanda Klein MD Signature Date/Time: 01/23/2020/5:37:49 PM    Final     Microbiology Recent Results (from the past 240 hour(s))  Respiratory Panel by RT PCR (Flu A&B, Covid) - Nasopharyngeal Swab     Status: Abnormal   Collection Time: 02/06/20  1:20 PM   Specimen: Nasopharyngeal Swab  Result Value Ref Range Status   SARS Coronavirus 2 by RT PCR POSITIVE (A) NEGATIVE Final    Comment: RESULT CALLED TO, READ BACK BY AND VERIFIED WITH: A MCKOZWN [id number] AT [id number] BY NFIELDS (NOTE) SARS-CoV-2 target nucleic acids are DETECTED. SARS-CoV-2 RNA is generally detectable in upper respiratory specimens  during the acute phase of infection. Positive results are indicative of the presence of the identified virus, but do not rule out bacterial infection or co-infection with other pathogens not detected by the test. Clinical correlation with patient history and other diagnostic information is necessary to determine patient infection status. The expected result is Negative. Fact Sheet for Patients:  PinkCheek.be Fact Sheet for Healthcare Providers: GravelBags.it This test is not yet approved or cleared by the [place] FDA and  has been authorized for detection and/or diagnosis of SARS-CoV-2 by FDA under an Emergency Use Authorization (EUA).  This EUA will remain in effect (meaning this test can be used) f or the duration of  the  COVID-19 declaration under Section 564(b)(1) of the Act, 21 U.S.C. section 360bbb-3(b)(1), unless the authorization is terminated or revoked sooner.    Influenza A by PCR NEGATIVE NEGATIVE Final   Influenza B by PCR NEGATIVE NEGATIVE Final    Comment: (NOTE) The Xpert Xpress SARS-CoV-2/FLU/RSV assay is intended as an aid in  the diagnosis of influenza from Nasopharyngeal swab specimens and  should not be used as a sole basis for treatment. Nasal washings and  aspirates are unacceptable for Xpert Xpress SARS-CoV-2/FLU/RSV  testing. Fact Sheet for Patients: PinkCheek.be Fact Sheet for Healthcare Providers: GravelBags.it This test is not yet approved or cleared by the [place] FDA and  has been authorized for detection and/or diagnosis of SARS-CoV-2 by  FDA under an Emergency Use Authorization (EUA). This EUA will remain  in  effect (meaning this test can be used) for the duration of the  Covid-19 declaration under Section 564(b)(1) of the Act, 21  U.S.C. section 360bbb-3(b)(1), unless the authorization is  terminated or revoked. Performed at [facility], [address], [place], [place] [address]     Lab Basic Metabolic Panel: Recent Labs  Lab 02/06/20 0010  NA 136  K 3.3*  CL 100  CO2 27  GLUCOSE 113*  BUN 34*  CREATININE 1.41*  CALCIUM 7.5*   Liver Function Tests: Recent Labs  Lab 02/06/20 0010  AST 48*  ALT 50*  ALKPHOS 62  BILITOT 1.4*  PROT 4.5*  ALBUMIN 2.2*   No results for input(s): LIPASE, AMYLASE in the last 168 hours. No results for input(s): AMMONIA in the last 168 hours. CBC: Recent Labs  Lab 02/06/20 0010  WBC 14.3*  NEUTROABS 12.3*  HGB 10.8*  HCT 32.0*  MCV 97.0  PLT 168   Cardiac Enzymes: No results for input(s): CKTOTAL, CKMB, CKMBINDEX, TROPONINI in the last 168 hours. Sepsis Labs: Recent Labs  Lab 02/06/20 0010  WBC 14.3*  LATICACIDVEN 1.6     Procedures/Operations    Annabell Oconnor 2020/02/10, 6:02 PM

## 2020-03-09 NOTE — Progress Notes (Signed)
Daughters of patient, Revonda Standard and Consuella Lose, at bedside. [place] visitation policy explained. Understanding affirmed.

## 2020-03-09 NOTE — Progress Notes (Signed)
PROGRESS NOTE                                                                                                                                                                                                             Patient Demographics:    Jon Baker, is a 86 y.o. male, DOB - 02-Mar-1932, UXL:[id number]  Admit date - 01/27/2020   Admitting Physician John Giovanni, MD  Outpatient Primary MD for the patient is [name], Georgina Quint, MD  LOS - 1   Chief Complaint  Patient presents with  . Shortness of Breath    85% @ 4lpm [place]. Denies cp, dizzines, abd pain, n/v/d       Brief Narrative   86 y.o. 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  His oxygen saturation was 85% on 4 L supplemental oxygen at his SNF.   Patient was recently admitted to the hospital 01/23/2020-02/01/2020 for acute on chronic hypoxic respiratory failure secondary to COVID-19 pneumonia and acute on chronic CHF with EF 35-40% superimposed on his advanced COPD and ILD.  He was intubated and admitted to the ICU.  He was brought into the ER unresponsive and had a fever.  He was intubated in the ER and admitted to the ICU.  Required pressors.  He was treated for COVID-19 and diuresed with IV Lasix.  His D-dimer was elevated during this hospitalization and he did not receive chemical prophylaxis due to hematuria while he was admitted in the ICU.  Lower extremity Dopplers negative for DVT.  Since his D-dimer was still quite high, he was placed on prophylactic dose Eliquis with plan to continue it for 2 weeks.  Discharged to SNF on prednisone 10 mg daily which she has been  taking chronically.  Patient did not have decision-making capacity due to his altered mental status.  ED provider had a conversation with the patient's daughter who expressed that the patient is DNR and DNI.  She did not want any invasive measures and decided to transition the patient to full  comfort care.  Patient was admitted, family had issues with the visitation policy for COVID-19 positive patient, and admission to the floor has been held given patient's daughter that agreeable for admission, palliative medicine were consulted, to assist with disposition, patient was admitted to medical floor with full comfort measures.   Subjective:    [name] today is obtunded, unable to provide any complaints.   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  Chest x-ray showing findings concerning for worsening multifocal pneumonia.  Patient does not have decision-making capacity due to his altered mental status.  His daughter does not want any further medical treatment for his illness and has decided to transition him  to full comfort care.  He is DNR and DNI.  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  Lab Results  Component Value Date   SARSCOV2NAA POSITIVE (A) 02/06/2020   SARSCOV2NAA POSITIVE (A) 01/23/2020   Goals of care  Code Status : DNR /Comfort  Family Communication  :  family was updated by palliative  Disposition Plan  : Anticipate hospital death  Consults  :  Palliative  DVT Prophylaxis  :  Comfort  Lab Results  Component Value Date   PLT 168 02/06/2020    Antibiotics  :   Anti-infectives (From admission, onward)   Start     Dose/Rate Route Frequency Ordered Stop   02/06/20 0030  vancomycin (VANCOREADY) IVPB 2000 mg/400 mL  Status:  Discontinued     2,000 mg 200 mL/hr over 120 Minutes Intravenous  Once 02/06/20 0013 02/06/20 0241   02/06/20 0015  ceFEPIme (MAXIPIME) 2 g in sodium chloride 0.9 % 100 mL IVPB  Status:  Discontinued     2 g 200 mL/hr over 30 Minutes Intravenous  Once 02/06/20 0009 02/06/20 0241   02/06/20 0015  metroNIDAZOLE (FLAGYL) IVPB 500 mg  Status:  Discontinued     500 mg 100 mL/hr over 60 Minutes Intravenous  Once 02/06/20 0009 02/06/20 0241   02/06/20 0015  vancomycin (VANCOCIN) IVPB 1000 mg/200 mL premix  Status:  Discontinued     1,000  mg 200 mL/hr over 60 Minutes Intravenous  Once 02/06/20 0009 02/06/20 0013        Objective:   Vitals:   02/06/20 2040 Feb 29, 2020 0007  02-29-20 0020 02-29-2020 0741  BP:  (!) 148/68  (!) 161/80  Pulse: 70 77 72 73  Resp: (!) 26 (!) 22 (!) 23 (!) 22  Temp:  97.8 F (36.6 C)  98.1 F (36.7 C)  TempSrc:  Axillary  Oral  SpO2: 99% (!) 86% 91% 93%    Wt Readings from Last 3 Encounters:  01/30/20 104.8 kg  10/27/19 101.2 kg  08/19/19 99.3 kg     Intake/Output Summary (Last 24 hours) at 02-29-2020 1602 Last data filed at 02/29/2020 0630 Gross per 24 hour  Intake --  Output 400 ml  Net -400 ml     Physical Exam  Patient appears to be comfortable, open eyes occasionally to verbal stimuli, does not follow commands or answer questions . Breathing is symmetrical bilaterally  Regular rate and rhythm   Bowel sounds present  No cyanosis or edema in lower ext.    Data Review:    CBC Recent Labs  Lab 02/06/20 0010  WBC 14.3*  HGB 10.8*  HCT 32.0*  PLT 168  MCV 97.0  MCH 32.7  MCHC 33.8  RDW 12.9  LYMPHSABS 0.5*  MONOABS 1.1*  EOSABS 0.2  BASOSABS 0.0    Chemistries  Recent Labs  Lab 02/06/20 0010  NA 136  K 3.3*  CL 100  CO2 27  GLUCOSE 113*  BUN 34*  CREATININE 1.41*  CALCIUM 7.5*  AST 48*  ALT 50*  ALKPHOS 62  BILITOT 1.4*   ------------------------------------------------------------------------------------------------------------------ No results for input(s): CHOL, HDL, LDLCALC, TRIG, CHOLHDL, LDLDIRECT in the last 72 hours.  Lab Results  Component Value Date   HGBA1C 5.6 05/22/2015   ------------------------------------------------------------------------------------------------------------------ No results for input(s): TSH, T4TOTAL, T3FREE, THYROIDAB in the last 72 hours.  Invalid input(s): FREET3 ------------------------------------------------------------------------------------------------------------------ No results for input(s): VITAMINB12, FOLATE, FERRITIN, TIBC, IRON, RETICCTPCT in the last 72 hours.  Coagulation profile Recent Labs  Lab 02/06/20 0010  INR 1.6*     No results for input(s): DDIMER in the last 72 hours.  Cardiac Enzymes No results for input(s): CKMB, TROPONINI, MYOGLOBIN in the last 168 hours.  Invalid input(s): CK ------------------------------------------------------------------------------------------------------------------    Component Value Date/Time   BNP 286.6 (H) 01/31/2020 0310    Inpatient Medications  Scheduled Meds: . LORazepam  2 mg Intravenous Q4H   Continuous Infusions: . morphine 1 mg/hr (February 29, 2020 1153)   PRN Meds:.antiseptic oral rinse, glycopyrrolate **OR** glycopyrrolate **OR** glycopyrrolate, haloperidol **OR** haloperidol **OR** haloperidol lactate, morphine injection, morphine, ondansetron **OR** ondansetron (ZOFRAN) IV, polyvinyl alcohol  Micro Results Recent Results (from the past 240 hour(s))  Respiratory Panel by RT PCR (Flu A&B, Covid) - Nasopharyngeal Swab     Status: Abnormal   Collection Time: 02/06/20  1:20 PM   Specimen: Nasopharyngeal Swab  Result Value Ref Range Status   SARS Coronavirus 2 by RT PCR POSITIVE (A) NEGATIVE Final    Comment: RESULT CALLED TO, READ BACK BY AND VERIFIED WITH: A MCKOZWN [id number] AT 1453 BY NFIELDS (NOTE) SARS-CoV-2 target nucleic acids are DETECTED. SARS-CoV-2 RNA is generally detectable in upper respiratory specimens  during the acute phase of infection. Positive results are indicative of the presence of the identified virus, but do not rule out bacterial infection or co-infection with other pathogens not detected by the test. Clinical correlation with patient history and other diagnostic information  is necessary to determine patient infection status. The expected result is Negative. Fact Sheet for Patients:  https://www.moore.com/ Fact Sheet for Healthcare Providers: https://www.young.biz/ This test is not yet approved or cleared by the [place] FDA and  has been authorized for detection and/or diagnosis of  SARS-CoV-2 by FDA under an Emergency Use Authorization (EUA).  This EUA will remain in effect (meaning this test can be used) f or the duration of  the COVID-19 declaration under Section 564(b)(1) of the Act, 21 U.S.C. section 360bbb-3(b)(1), unless the authorization is terminated or revoked sooner.    Influenza A by PCR NEGATIVE NEGATIVE Final   Influenza B by PCR NEGATIVE NEGATIVE Final    Comment: (NOTE) The Xpert Xpress SARS-CoV-2/FLU/RSV assay is intended as an aid in  the diagnosis of influenza from Nasopharyngeal swab specimens and  should not be used as a sole basis for treatment. Nasal washings and  aspirates are unacceptable for Xpert Xpress SARS-CoV-2/FLU/RSV  testing. Fact Sheet for Patients: https://www.moore.com/ Fact Sheet for Healthcare Providers: https://www.young.biz/ This test is not yet approved or cleared by the [place] FDA and  has been authorized for detection and/or diagnosis of SARS-CoV-2 by  FDA under an Emergency Use Authorization (EUA). This EUA will remain  in effect (meaning this test can be used) for the duration of the  Covid-19 declaration under Section 564(b)(1) of the Act, 21  U.S.C. section 360bbb-3(b)(1), unless the authorization is  terminated or revoked. Performed at [facility], [address] [place], [place] [address]     Radiology Reports DG Abd 1 View  Result Date: 01/23/2020 CLINICAL DATA:  Check feeding catheter placement EXAM: ABDOMEN - 1 VIEW COMPARISON:  None. FINDINGS: Gastric catheter is noted with the tip in the distal stomach. Scattered large and small bowel gas is noted. IMPRESSION: Gastric catheter within the distal stomach. Electronically Signed   By: Alcide Clever M.D.   On: 01/23/2020 16:38   CT Head Wo Contrast  Result Date: 01/23/2020 CLINICAL DATA:  Encephalopathy, unresponsive EXAM: CT HEAD WITHOUT CONTRAST TECHNIQUE: Contiguous axial images were obtained from the  base of the skull through the vertex without intravenous contrast. COMPARISON:  07/30/2010 FINDINGS: Brain: Confluent hypodensities throughout the periventricular white matter are compatible with age-indeterminate small vessel ischemic changes, favor chronic. No other signs of acute infarct or hemorrhage. Lateral ventricles and remaining midline structures are unremarkable. No acute extra-axial fluid collections. No mass effect. Vascular: No hyperdense vessel or unexpected calcification. Skull: Normal. Negative for fracture or focal lesion. Sinuses/Orbits: Mucoperiosteal thickening is seen within the bilateral ethmoid air cells. Other: None IMPRESSION: 1. Likely chronic small-vessel ischemic changes throughout the white matter. 2. No acute intracranial process otherwise. Electronically Signed   By: Sharlet Salina M.D.   On: 01/23/2020 15:02   DG Chest Port 1 View  Result Date: 02/06/2020 CLINICAL DATA:  Cough, short of breath, COPD, asbestos related lung disease, COVID-19 positive EXAM: PORTABLE CHEST 1 VIEW COMPARISON:  01/28/2020 FINDINGS: Single frontal view of the chest demonstrates marked progression of interstitial and ground-glass opacity seen bilaterally. No effusion or pneumothorax. Cardiac silhouette remains enlarged. No acute bony abnormalities. IMPRESSION: 1. Interval development of bilateral perihilar ground-glass airspace disease, compatible with COVID-19 pneumonia. Electronically Signed   By: Sharlet Salina M.D.   On: 02/06/2020 00:45   DG Chest Port 1 View  Result Date: 01/28/2020 CLINICAL DATA:  Shortness of breath. COPD. Additional history provided by technologist: Wheezing, COVID positive. EXAM: PORTABLE CHEST 1 VIEW COMPARISON:  Chest radiograph 01/23/2020 FINDINGS: Interval extubation.  Overlying cardiac monitoring leads. Unchanged cardiomegaly.  Aortic atherosclerosis. There has been significant interval improvement of apical predominant bilateral pulmonary opacities. Minimal linear  atelectasis at the right lung base no evidence of pleural effusion or pneumothorax. No acute bony abnormality. IMPRESSION: Interval extubation. Significant interval improvement of apical predominant bilateral pulmonary opacities. Findings likely reflect sequela of pneumonia given provided history. Minimal linear atelectasis at the right lung base. Electronically Signed   By: Kellie Simmering DO   On: 01/28/2020 09:25   DG Chest Portable 1 View  Result Date: 01/23/2020 CLINICAL DATA:  Respiratory failure EXAM: PORTABLE CHEST 1 VIEW COMPARISON:  June 24, 2019 FINDINGS: Endotracheal tube is identified distal tip 6.3 cm from carina. Increased pulmonary interstitium is identified in the bilateral upper lobes and right mid lung. There is no pleural effusion. The aorta is tortuous. The heart size is upper limits are normal. A feeding tube is identified with distal tip not included on film. IMPRESSION: Increased pulmonary interstitium in the bilateral upper lobes and right mid lung, findings are suspicious for developing pneumonias. Electronically Signed   By: Abelardo Diesel M.D.   On: 01/23/2020 13:59   VAS [place] LOWER EXTREMITY VENOUS (DVT)  Result Date: 01/28/2020  Lower Venous DVTStudy Indications: Elevated Ddimer.  Risk Factors: COVID 19 positive. Comparison Study: No prior studies. Performing Technologist: Oliver Hum RVT  Examination Guidelines: A complete evaluation includes B-mode imaging, spectral Doppler, color Doppler, and power Doppler as needed of all accessible portions of each vessel. Bilateral testing is considered an integral part of a complete examination. Limited examinations for reoccurring indications may be performed as noted. The reflux portion of the exam is performed with the patient in reverse Trendelenburg.  +---------+---------------+---------+-----------+----------+--------------+ RIGHT    CompressibilityPhasicitySpontaneityPropertiesThrombus Aging  +---------+---------------+---------+-----------+----------+--------------+ CFV      Full           Yes      Yes                                 +---------+---------------+---------+-----------+----------+--------------+ SFJ      Full                                                        +---------+---------------+---------+-----------+----------+--------------+ FV Prox  Full                                                        +---------+---------------+---------+-----------+----------+--------------+ FV Mid   Full                                                        +---------+---------------+---------+-----------+----------+--------------+ FV DistalFull                                                        +---------+---------------+---------+-----------+----------+--------------+  PFV      Full                                                        +---------+---------------+---------+-----------+----------+--------------+ POP      Full           Yes      Yes                                 +---------+---------------+---------+-----------+----------+--------------+ PTV      Full                                                        +---------+---------------+---------+-----------+----------+--------------+ PERO     Full                                                        +---------+---------------+---------+-----------+----------+--------------+   +---------+---------------+---------+-----------+----------+--------------+ LEFT     CompressibilityPhasicitySpontaneityPropertiesThrombus Aging +---------+---------------+---------+-----------+----------+--------------+ CFV      Full           Yes      Yes                                 +---------+---------------+---------+-----------+----------+--------------+ SFJ      Full                                                         +---------+---------------+---------+-----------+----------+--------------+ FV Prox  Full                                                        +---------+---------------+---------+-----------+----------+--------------+ FV Mid   Full                                                        +---------+---------------+---------+-----------+----------+--------------+ FV DistalFull                                                        +---------+---------------+---------+-----------+----------+--------------+ PFV      Full                                                        +---------+---------------+---------+-----------+----------+--------------+  POP      Full           Yes      Yes                                 +---------+---------------+---------+-----------+----------+--------------+ PTV      Full                                                        +---------+---------------+---------+-----------+----------+--------------+ PERO     Full                                                        +---------+---------------+---------+-----------+----------+--------------+     Summary: RIGHT: - There is no evidence of deep vein thrombosis in the lower extremity.  - No cystic structure found in the popliteal fossa.  LEFT: - There is no evidence of deep vein thrombosis in the lower extremity.  - No cystic structure found in the popliteal fossa.  *See table(s) above for measurements and observations. Electronically signed by Deitra Mayo MD on 01/28/2020 at 10:15:57 AM.    Final    ECHOCARDIOGRAM LIMITED  Result Date: 01/23/2020    ECHOCARDIOGRAM REPORT   Patient Name:   LIONELL MATUSZAK Date of Exam: 01/23/2020 Medical Rec #:  [id number]       Height:       71.0 in Accession #:    [id number]      Weight: Date of Birth:  [birth year]       BSA: Patient Age:    53 years        BP:           99/74 mmHg Patient Gender: M               HR:           62 bpm. Exam  Location:  Inpatient Procedure: Limited Echo, Cardiac Doppler and Limited Color Doppler Indications:    Cardiomegaly 429.3  History:        Patient has no prior history of Echocardiogram examinations.                 CHF, CAD; COPD and Covid.  Sonographer:    Johny Chess Referring Phys: [id number] [place]  1. Left ventricular ejection fraction, by estimation, is 35 to 40%. The left ventricle has moderately decreased function. The left ventricle demonstrates regional wall motion abnormalities (see scoring diagram/findings for description). The left ventricular internal cavity size was mildly dilated. Left ventricular diastolic parameters are consistent with Grade I diastolic dysfunction (impaired relaxation). There is moderate hypokinesis of the left ventricular, mid-apical anteroseptal wall, anterior wall and apical segment.  2. Right ventricular systolic function is mildly reduced. The right ventricular size is mildly enlarged. There is mildly elevated pulmonary artery systolic pressure.  3. Left atrial size was mildly dilated.  4. The mitral valve is normal in structure and function. No evidence of mitral valve regurgitation.  5. The aortic valve is tricuspid. Aortic valve regurgitation is not visualized. Mild to moderate aortic valve sclerosis/calcification is present, without any  evidence of aortic stenosis.  6. The inferior vena cava is dilated in size with <50% respiratory variability, suggesting right atrial pressure of 15 mmHg. This is a nonspecific finding during positive pressure ventilation. FINDINGS  Left Ventricle: Left ventricular ejection fraction, by estimation, is 35 to 40%. The left ventricle has moderately decreased function. The left ventricle demonstrates regional wall motion abnormalities. Moderate hypokinesis of the left ventricular, mid-apical anteroseptal wall, anterior wall and apical segment. The left ventricular internal cavity size was mildly dilated. There is no  left ventricular hypertrophy. Left ventricular diastolic parameters are consistent with Grade I diastolic dysfunction (impaired relaxation). Normal left ventricular filling pressure.  LV Wall Scoring: The mid and distal anterior wall, mid anteroseptal segment, and apex are hypokinetic. Right Ventricle: The right ventricular size is mildly enlarged. No increase in right ventricular wall thickness. Right ventricular systolic function is mildly reduced. There is mildly elevated pulmonary artery systolic pressure. The tricuspid regurgitant  velocity is 1.94 m/s, and with an assumed right atrial pressure of 15 mmHg, the estimated right ventricular systolic pressure is 30.1 mmHg. Left Atrium: Left atrial size was mildly dilated. Right Atrium: Right atrial size was not well visualized. Pericardium: There is no evidence of pericardial effusion. Mitral Valve: The mitral valve is normal in structure and function. No evidence of mitral valve regurgitation. Tricuspid Valve: The tricuspid valve is normal in structure. Tricuspid valve regurgitation is not demonstrated. Aortic Valve: The aortic valve is tricuspid. Aortic valve regurgitation is not visualized. Mild to moderate aortic valve sclerosis/calcification is present, without any evidence of aortic stenosis. Pulmonic Valve: The pulmonic valve was not well visualized. Pulmonic valve regurgitation is not visualized. Aorta: The aortic root is normal in size and structure. Venous: The inferior vena cava is dilated in size with less than 50% respiratory variability, suggesting right atrial pressure of 15 mmHg. IAS/Shunts: No atrial level shunt detected by color flow Doppler.  LEFT VENTRICLE PLAX 2D LVIDd:         5.90 cm  Diastology LVIDs:         4.80 cm  LV e' lateral:   4.13 cm/s LV PW:         0.80 cm  LV E/e' lateral: 11.0 LV IVS:        1.10 cm  LV e' medial:    4.13 cm/s LVOT diam:     2.20 cm  LV E/e' medial:  11.0 LVOT Area:     3.80 cm  LEFT ATRIUM LA diam:    3.40 cm    AORTA Ao Root diam: 3.60 cm MITRAL VALVE               TRICUSPID VALVE MV Area (PHT): 3.60 cm    TR Peak grad:   15.1 mmHg MV Decel Time: 211 msec    TR Vmax:        194.00 cm/s MV E velocity: 45.40 cm/s MV A velocity: 87.40 cm/s  SHUNTS MV E/A ratio:  0.52        Systemic Diam: 2.20 cm Rachelle Hora Croitoru MD Electronically signed by Thurmon Fair MD Signature Date/Time: 01/23/2020/5:37:49 PM    Final      Huey Bienenstock M.D on 02/12/2020 at 4:02 PM  Between 7am to 7pm - Pager - [phone]  After 7pm go to [email or web address] - password [email or web address]  [facility] -  Office  [phone]

## 2020-03-09 NOTE — Care Management (Addendum)
CM acknowledges consult for [facility].  Palliative GOC occurred today - pt has been placed on morphine drip - anticipated hospital death.  Please re-consult if pt stabilizes and pt is expected to become stable for transport for non hospital death

## 2020-03-09 NOTE — Plan of Care (Signed)
  Problem: Education: Goal: Knowledge of risk factors and measures for prevention of condition will improve Outcome: Not Progressing   Problem: Coping: Goal: Psychosocial and spiritual needs will be supported Outcome: Progressing   Problem: Respiratory: Goal: Will maintain a patent airway Outcome: Progressing Goal: Complications related to the disease process, condition or treatment will be avoided or minimized Outcome: Progressing   Problem: Education: Goal: Knowledge of the prescribed therapeutic regimen will improve Outcome: Not Progressing   Problem: Coping: Goal: Ability to identify and develop effective coping behavior will improve Outcome: Progressing   Problem: Clinical Measurements: Goal: Quality of life will improve Outcome: Progressing   Problem: Respiratory: Goal: Verbalizations of increased ease of respirations will increase Outcome: Progressing   Problem: Role Relationship: Goal: Family's ability to cope with current situation will improve Outcome: Progressing Goal: Ability to verbalize concerns, feelings, and thoughts to partner or family member will improve Outcome: Not Applicable   Problem: Pain Management: Goal: Satisfaction with pain management regimen will improve Outcome: Progressing

## 2020-03-09 NOTE — Progress Notes (Addendum)
Patient quite agitated.  Continually pulling on NRB and condom catheter, moving arms continually.  Patient able to state name, AOX1.  Patient denies pain.  Patient placed on 8L/HFNC as NRB seems to be agitating him.  Patient given prn IV ativan.  Patient turned and repositioned and sacral foam placed.  No skin breakdown on sacrum at time of placement.  At 0330, patient pulled off nasal cannula, sats dropped to 70's.  Mitts placed on patient.  Patient observed to be resting quietly short time later.  Sats 92-95% on 8L/HFNC.

## 2020-03-09 DEATH — deceased

## 2021-01-30 IMAGING — DX DG ABDOMEN 1V
1 series · 1 of 1 positions shown · non-contrast
Comparison: None.

CLINICAL DATA: Check feeding catheter placement

EXAM:
ABDOMEN - 1 VIEW

[abdomen kub]
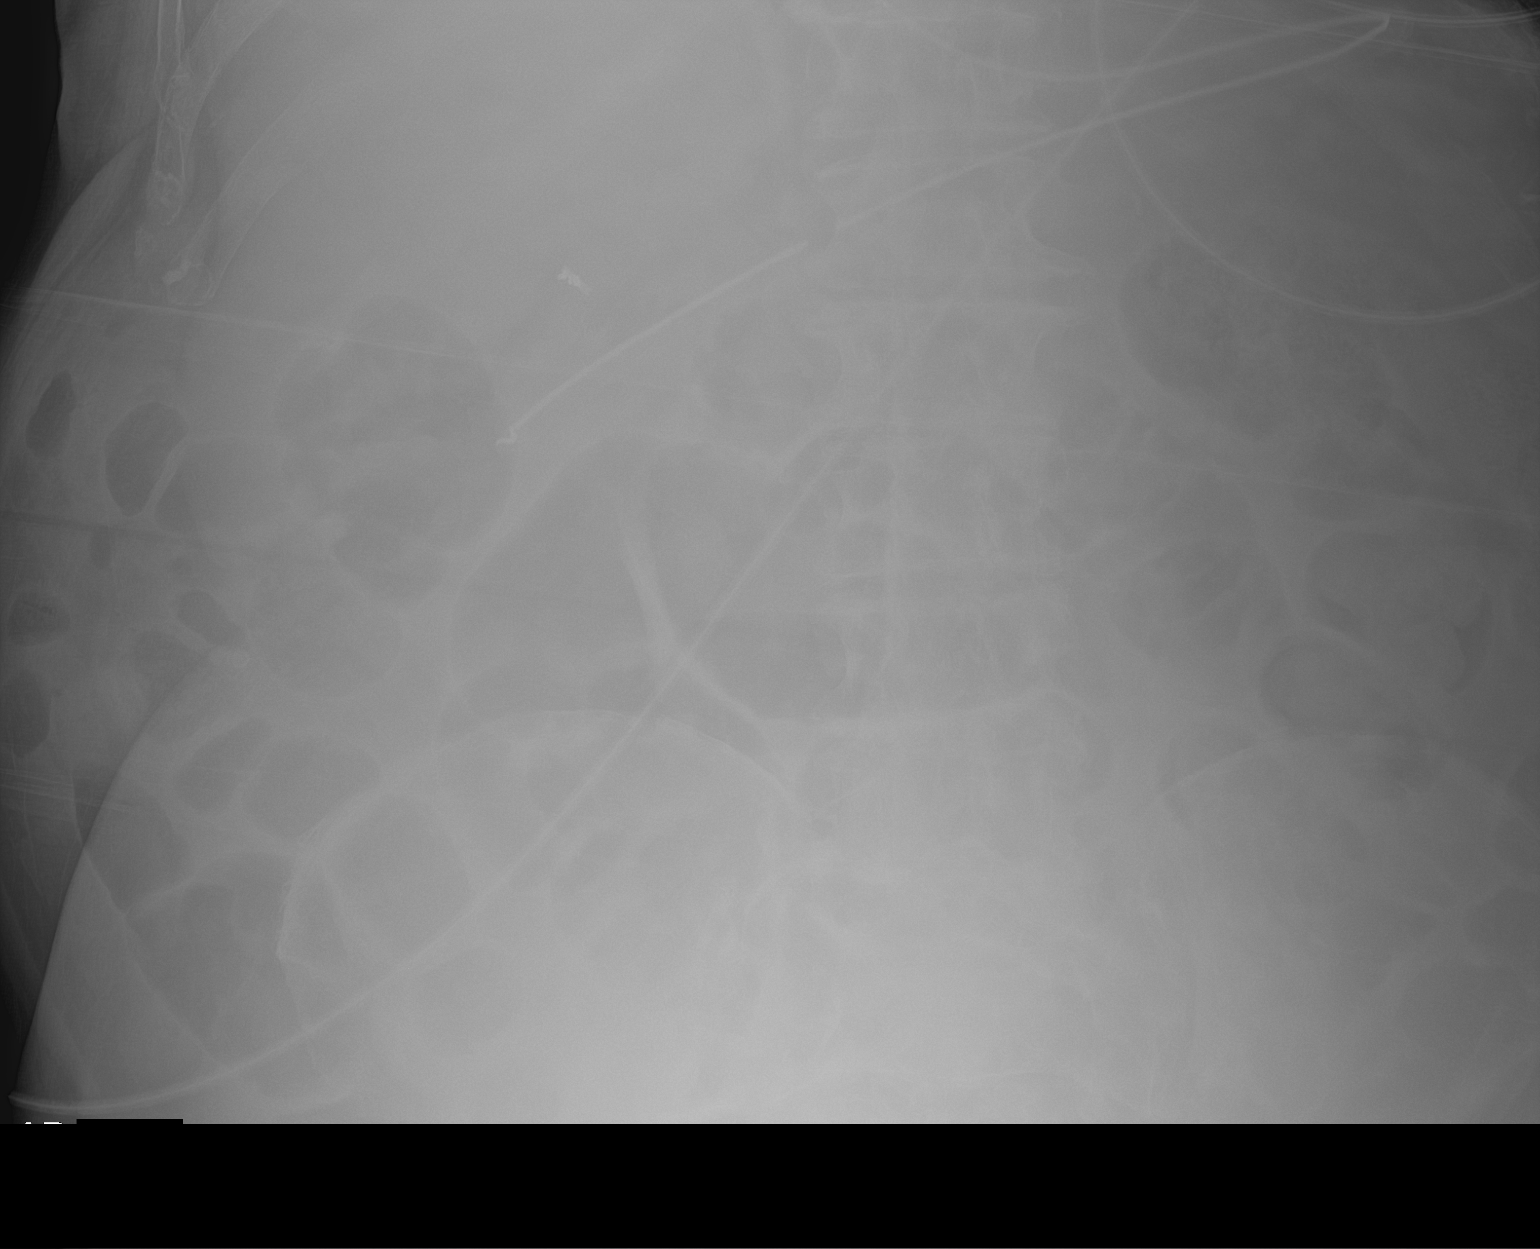

[1 of 1 positions shown; findings below may reference images not displayed]

FINDINGS: Gastric catheter is noted with the tip in the distal stomach.
Scattered large and small bowel gas is noted.
IMPRESSION: Gastric catheter within the distal stomach.

## 2021-02-04 IMAGING — DX DG CHEST 1V PORT
1 series · 1 of 1 positions shown · non-contrast
Comparison: Chest radiograph 01/23/2020

CLINICAL DATA: Shortness of breath. COPD. Additional history
provided by technologist: Wheezing, COVID positive.

EXAM:
PORTABLE CHEST 1 VIEW

[chest ap]
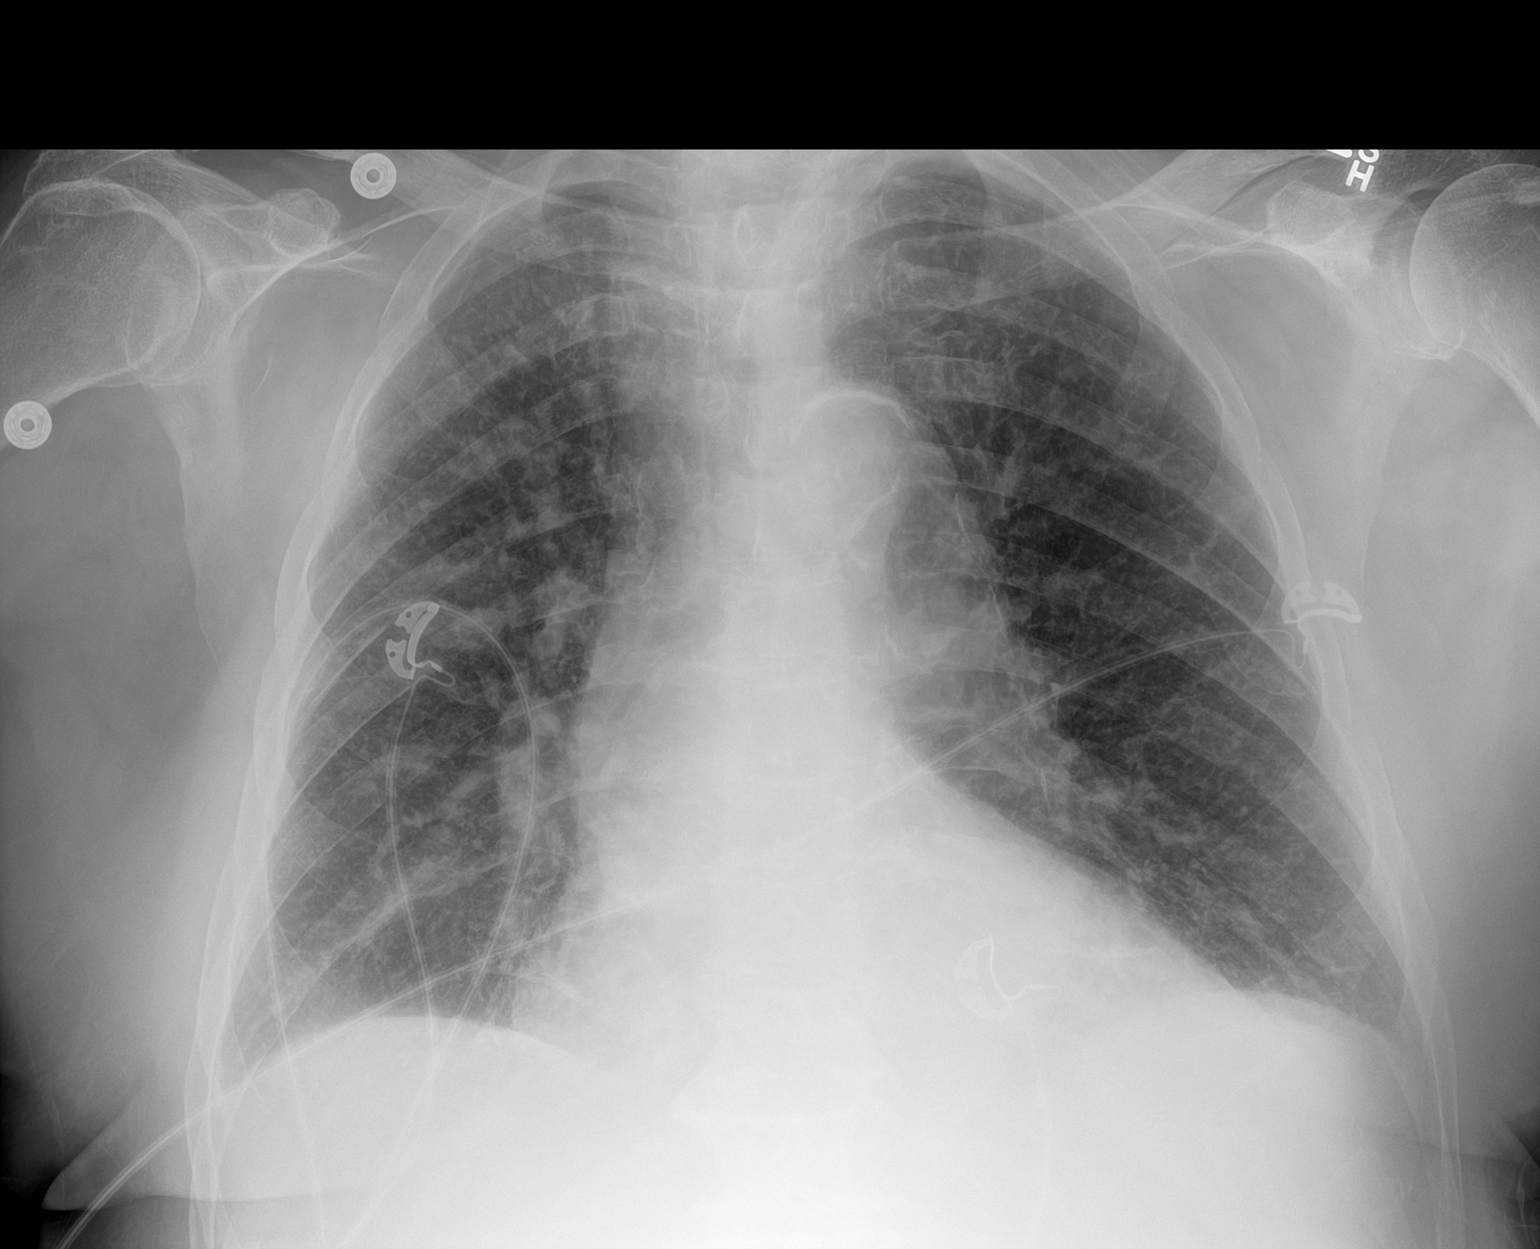

[1 of 1 positions shown; findings below may reference images not displayed]

FINDINGS: Interval extubation.  Overlying cardiac monitoring leads.

Unchanged cardiomegaly.  Aortic atherosclerosis.

There has been significant interval improvement of apical
predominant bilateral pulmonary opacities. Minimal linear
atelectasis at the right lung base no evidence of pleural effusion
or pneumothorax. No acute bony abnormality.
IMPRESSION: Interval extubation.

Significant interval improvement of apical predominant bilateral
pulmonary opacities. Findings likely reflect sequela of pneumonia
given provided history.

Minimal linear atelectasis at the right lung base.

## 2021-02-13 IMAGING — DX DG CHEST 1V PORT
1 series · 1 of 1 positions shown · non-contrast
Comparison: 01/28/2020

CLINICAL DATA: Cough, short of breath, COPD, asbestos related lung
disease, [id number] positive

EXAM:
PORTABLE CHEST 1 VIEW

[chest ap]
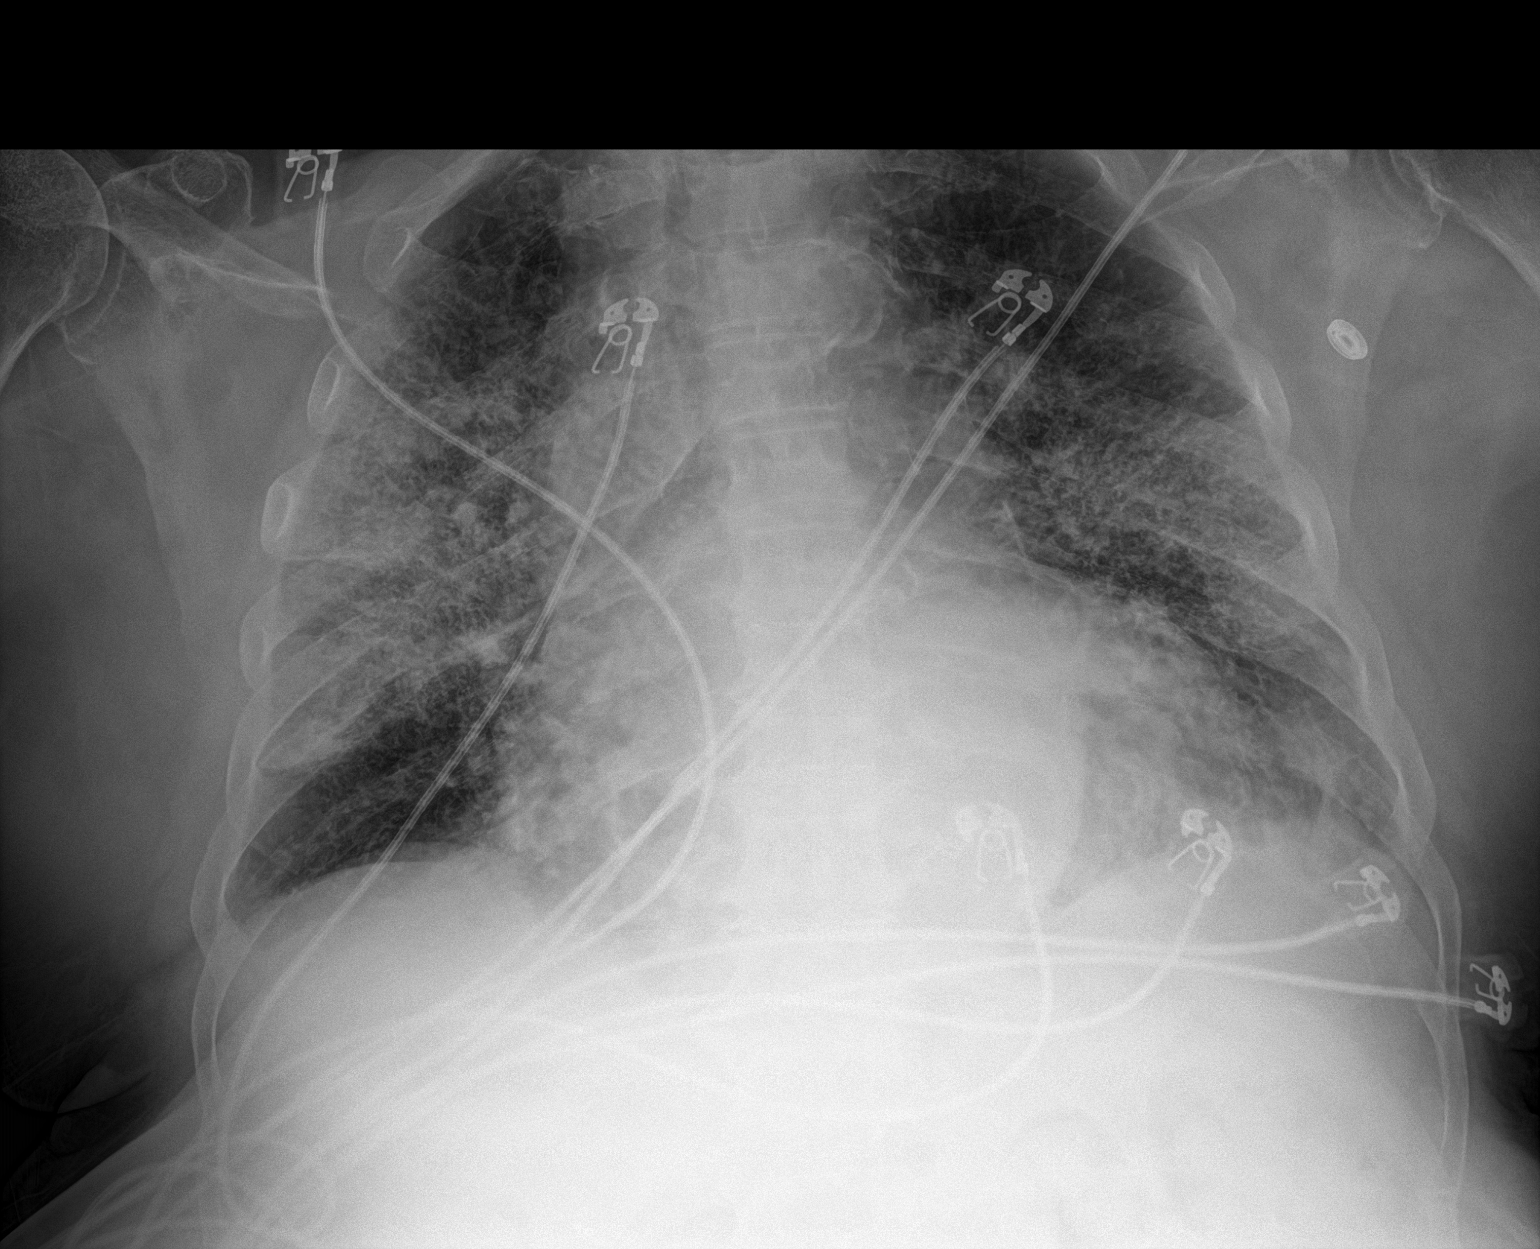

[1 of 1 positions shown; findings below may reference images not displayed]

FINDINGS: Single frontal view of the chest demonstrates marked progression of
interstitial and ground-glass opacity seen bilaterally. No effusion
or pneumothorax. Cardiac silhouette remains enlarged. No acute bony
abnormalities.
IMPRESSION: 1. Interval development of bilateral perihilar ground-glass airspace
disease, compatible with [id number] pneumonia.
